# Patient Record
Sex: Male | Born: 1935 | Race: White | Hispanic: No | Marital: Married | Smoking: Former smoker
Health system: Southern US, Community
[De-identification: ages and names within clinical notes are randomized; demographics above are authoritative.]

## PROBLEM LIST (undated history)

## (undated) DIAGNOSIS — Z8719 Personal history of other diseases of the digestive system: Secondary | ICD-10-CM

## (undated) DIAGNOSIS — G4733 Obstructive sleep apnea (adult) (pediatric): Secondary | ICD-10-CM

## (undated) DIAGNOSIS — K635 Polyp of colon: Secondary | ICD-10-CM

## (undated) DIAGNOSIS — N400 Enlarged prostate without lower urinary tract symptoms: Secondary | ICD-10-CM

## (undated) DIAGNOSIS — D568 Other thalassemias: Secondary | ICD-10-CM

## (undated) DIAGNOSIS — C61 Malignant neoplasm of prostate: Secondary | ICD-10-CM

## (undated) DIAGNOSIS — R600 Localized edema: Secondary | ICD-10-CM

## (undated) DIAGNOSIS — I341 Nonrheumatic mitral (valve) prolapse: Secondary | ICD-10-CM

## (undated) DIAGNOSIS — Z950 Presence of cardiac pacemaker: Secondary | ICD-10-CM

## (undated) DIAGNOSIS — Z973 Presence of spectacles and contact lenses: Secondary | ICD-10-CM

## (undated) DIAGNOSIS — D649 Anemia, unspecified: Secondary | ICD-10-CM

## (undated) DIAGNOSIS — C4491 Basal cell carcinoma of skin, unspecified: Secondary | ICD-10-CM

## (undated) DIAGNOSIS — M109 Gout, unspecified: Secondary | ICD-10-CM

## (undated) DIAGNOSIS — K219 Gastro-esophageal reflux disease without esophagitis: Secondary | ICD-10-CM

## (undated) DIAGNOSIS — R350 Frequency of micturition: Secondary | ICD-10-CM

## (undated) DIAGNOSIS — J302 Other seasonal allergic rhinitis: Secondary | ICD-10-CM

## (undated) DIAGNOSIS — N200 Calculus of kidney: Secondary | ICD-10-CM

## (undated) DIAGNOSIS — J189 Pneumonia, unspecified organism: Secondary | ICD-10-CM

## (undated) DIAGNOSIS — I440 Atrioventricular block, first degree: Secondary | ICD-10-CM

## (undated) DIAGNOSIS — D563 Thalassemia minor: Secondary | ICD-10-CM

## (undated) DIAGNOSIS — R233 Spontaneous ecchymoses: Secondary | ICD-10-CM

## (undated) DIAGNOSIS — Z9289 Personal history of other medical treatment: Secondary | ICD-10-CM

## (undated) DIAGNOSIS — M1990 Unspecified osteoarthritis, unspecified site: Secondary | ICD-10-CM

## (undated) DIAGNOSIS — K5909 Other constipation: Secondary | ICD-10-CM

## (undated) DIAGNOSIS — G4730 Sleep apnea, unspecified: Secondary | ICD-10-CM

## (undated) DIAGNOSIS — K5730 Diverticulosis of large intestine without perforation or abscess without bleeding: Secondary | ICD-10-CM

## (undated) HISTORY — PX: CATARACT EXTRACTION W/ INTRAOCULAR LENS  IMPLANT, BILATERAL: SHX1307

## (undated) HISTORY — DX: Anemia, unspecified: D64.9

## (undated) HISTORY — DX: Morbid (severe) obesity due to excess calories: E66.01

## (undated) HISTORY — DX: Diverticulosis of large intestine without perforation or abscess without bleeding: K57.30

## (undated) HISTORY — DX: Other thalassemias: D56.8

## (undated) HISTORY — DX: Gastro-esophageal reflux disease without esophagitis: K21.9

## (undated) HISTORY — DX: Benign prostatic hyperplasia without lower urinary tract symptoms: N40.0

## (undated) HISTORY — DX: Basal cell carcinoma of skin, unspecified: C44.91

## (undated) HISTORY — DX: Atrioventricular block, first degree: I44.0

## (undated) HISTORY — DX: Polyp of colon: K63.5

## (undated) HISTORY — PX: CLOSED REDUCTION ANKLE DISLOCATION: SUR209

## (undated) HISTORY — DX: Unspecified osteoarthritis, unspecified site: M19.90

---
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
## 1998-03-11 ENCOUNTER — Ambulatory Visit (HOSPITAL_COMMUNITY): Admission: RE | Admit: 1998-03-11 | Discharge: 1998-03-11 | Payer: Self-pay | Admitting: Internal Medicine

## 1998-03-11 ENCOUNTER — Encounter: Payer: Self-pay | Admitting: Internal Medicine

## 1998-03-18 ENCOUNTER — Ambulatory Visit (HOSPITAL_COMMUNITY): Admission: RE | Admit: 1998-03-18 | Discharge: 1998-03-18 | Payer: Self-pay | Admitting: Internal Medicine

## 1998-03-18 ENCOUNTER — Encounter: Payer: Self-pay | Admitting: Internal Medicine

## 1998-04-18 ENCOUNTER — Encounter: Payer: Self-pay | Admitting: Internal Medicine

## 1998-04-18 ENCOUNTER — Ambulatory Visit (HOSPITAL_COMMUNITY): Admission: RE | Admit: 1998-04-18 | Discharge: 1998-04-18 | Payer: Self-pay | Admitting: Internal Medicine

## 1998-04-19 ENCOUNTER — Encounter: Payer: Self-pay | Admitting: Internal Medicine

## 1998-04-20 ENCOUNTER — Ambulatory Visit (HOSPITAL_COMMUNITY): Admission: RE | Admit: 1998-04-20 | Discharge: 1998-04-20 | Payer: Self-pay | Admitting: Internal Medicine

## 1998-04-20 ENCOUNTER — Encounter: Payer: Self-pay | Admitting: Internal Medicine

## 1998-05-13 ENCOUNTER — Ambulatory Visit: Admission: RE | Admit: 1998-05-13 | Discharge: 1998-05-13 | Payer: Self-pay | Admitting: Internal Medicine

## 1999-10-15 ENCOUNTER — Other Ambulatory Visit: Admission: RE | Admit: 1999-10-15 | Discharge: 1999-10-15 | Payer: Self-pay | Admitting: Orthopedic Surgery

## 2000-03-16 HISTORY — PX: TUMOR REMOVAL: SHX12

## 2002-01-30 ENCOUNTER — Encounter: Payer: Self-pay | Admitting: Pulmonary Disease

## 2002-01-30 ENCOUNTER — Ambulatory Visit (HOSPITAL_BASED_OUTPATIENT_CLINIC_OR_DEPARTMENT_OTHER): Admission: RE | Admit: 2002-01-30 | Discharge: 2002-01-30 | Payer: Self-pay | Admitting: Internal Medicine

## 2002-03-03 ENCOUNTER — Encounter: Payer: Self-pay | Admitting: Internal Medicine

## 2002-04-04 ENCOUNTER — Encounter: Payer: Self-pay | Admitting: Pulmonary Disease

## 2003-06-29 ENCOUNTER — Emergency Department (HOSPITAL_COMMUNITY): Admission: EM | Admit: 2003-06-29 | Discharge: 2003-06-29 | Payer: Self-pay | Admitting: Emergency Medicine

## 2004-03-06 ENCOUNTER — Ambulatory Visit: Payer: Self-pay | Admitting: Internal Medicine

## 2004-05-30 ENCOUNTER — Ambulatory Visit: Payer: Self-pay | Admitting: Internal Medicine

## 2004-07-01 ENCOUNTER — Ambulatory Visit: Payer: Self-pay | Admitting: Internal Medicine

## 2005-01-15 ENCOUNTER — Ambulatory Visit: Payer: Self-pay | Admitting: Internal Medicine

## 2005-02-09 ENCOUNTER — Ambulatory Visit: Payer: Self-pay | Admitting: Internal Medicine

## 2005-02-19 ENCOUNTER — Ambulatory Visit: Payer: Self-pay | Admitting: Internal Medicine

## 2005-02-19 ENCOUNTER — Encounter (INDEPENDENT_AMBULATORY_CARE_PROVIDER_SITE_OTHER): Payer: Self-pay | Admitting: Specialist

## 2005-03-15 ENCOUNTER — Emergency Department (HOSPITAL_COMMUNITY): Admission: EM | Admit: 2005-03-15 | Discharge: 2005-03-15 | Payer: Self-pay | Admitting: Family Medicine

## 2005-04-13 ENCOUNTER — Ambulatory Visit: Payer: Self-pay | Admitting: Internal Medicine

## 2005-04-30 ENCOUNTER — Ambulatory Visit: Payer: Self-pay | Admitting: Pulmonary Disease

## 2005-05-04 ENCOUNTER — Ambulatory Visit: Payer: Self-pay | Admitting: Internal Medicine

## 2005-05-11 ENCOUNTER — Ambulatory Visit: Payer: Self-pay

## 2005-06-05 ENCOUNTER — Ambulatory Visit: Payer: Self-pay | Admitting: Cardiology

## 2005-07-02 ENCOUNTER — Ambulatory Visit: Payer: Self-pay | Admitting: Pulmonary Disease

## 2005-08-04 ENCOUNTER — Ambulatory Visit: Payer: Self-pay | Admitting: Internal Medicine

## 2005-08-11 ENCOUNTER — Ambulatory Visit: Payer: Self-pay | Admitting: Internal Medicine

## 2005-09-21 ENCOUNTER — Ambulatory Visit: Payer: Self-pay | Admitting: Cardiology

## 2005-09-22 ENCOUNTER — Ambulatory Visit: Payer: Self-pay | Admitting: Internal Medicine

## 2005-10-03 ENCOUNTER — Emergency Department (HOSPITAL_COMMUNITY): Admission: EM | Admit: 2005-10-03 | Discharge: 2005-10-03 | Payer: Self-pay | Admitting: Family Medicine

## 2005-10-12 ENCOUNTER — Ambulatory Visit: Payer: Self-pay | Admitting: Cardiology

## 2005-12-25 ENCOUNTER — Ambulatory Visit: Payer: Self-pay | Admitting: Internal Medicine

## 2006-03-16 HISTORY — PX: MOLE REMOVAL: SHX2046

## 2006-09-15 ENCOUNTER — Ambulatory Visit: Payer: Self-pay | Admitting: Internal Medicine

## 2006-10-04 ENCOUNTER — Ambulatory Visit: Payer: Self-pay | Admitting: Cardiology

## 2006-10-07 ENCOUNTER — Ambulatory Visit: Payer: Self-pay

## 2006-12-08 ENCOUNTER — Encounter: Payer: Self-pay | Admitting: *Deleted

## 2006-12-08 DIAGNOSIS — Z9989 Dependence on other enabling machines and devices: Secondary | ICD-10-CM

## 2006-12-08 DIAGNOSIS — I059 Rheumatic mitral valve disease, unspecified: Secondary | ICD-10-CM | POA: Insufficient documentation

## 2006-12-08 DIAGNOSIS — Z9849 Cataract extraction status, unspecified eye: Secondary | ICD-10-CM

## 2006-12-08 DIAGNOSIS — E785 Hyperlipidemia, unspecified: Secondary | ICD-10-CM | POA: Insufficient documentation

## 2006-12-08 DIAGNOSIS — G4733 Obstructive sleep apnea (adult) (pediatric): Secondary | ICD-10-CM | POA: Insufficient documentation

## 2006-12-08 DIAGNOSIS — Z87898 Personal history of other specified conditions: Secondary | ICD-10-CM | POA: Insufficient documentation

## 2006-12-08 DIAGNOSIS — I440 Atrioventricular block, first degree: Secondary | ICD-10-CM | POA: Insufficient documentation

## 2006-12-08 DIAGNOSIS — Z87442 Personal history of urinary calculi: Secondary | ICD-10-CM

## 2006-12-08 DIAGNOSIS — K5730 Diverticulosis of large intestine without perforation or abscess without bleeding: Secondary | ICD-10-CM | POA: Insufficient documentation

## 2006-12-08 DIAGNOSIS — Z9889 Other specified postprocedural states: Secondary | ICD-10-CM

## 2006-12-08 DIAGNOSIS — K279 Peptic ulcer, site unspecified, unspecified as acute or chronic, without hemorrhage or perforation: Secondary | ICD-10-CM | POA: Insufficient documentation

## 2006-12-08 DIAGNOSIS — D568 Other thalassemias: Secondary | ICD-10-CM

## 2006-12-08 DIAGNOSIS — Z8669 Personal history of other diseases of the nervous system and sense organs: Secondary | ICD-10-CM | POA: Insufficient documentation

## 2007-01-12 ENCOUNTER — Ambulatory Visit: Payer: Self-pay | Admitting: Internal Medicine

## 2007-08-29 ENCOUNTER — Ambulatory Visit: Payer: Self-pay | Admitting: Internal Medicine

## 2007-08-29 DIAGNOSIS — I10 Essential (primary) hypertension: Secondary | ICD-10-CM

## 2007-08-29 DIAGNOSIS — H6120 Impacted cerumen, unspecified ear: Secondary | ICD-10-CM | POA: Insufficient documentation

## 2007-08-29 DIAGNOSIS — J310 Chronic rhinitis: Secondary | ICD-10-CM

## 2007-08-29 DIAGNOSIS — M109 Gout, unspecified: Secondary | ICD-10-CM | POA: Insufficient documentation

## 2007-08-29 DIAGNOSIS — M1990 Unspecified osteoarthritis, unspecified site: Secondary | ICD-10-CM | POA: Insufficient documentation

## 2007-08-29 DIAGNOSIS — N400 Enlarged prostate without lower urinary tract symptoms: Secondary | ICD-10-CM | POA: Insufficient documentation

## 2007-08-29 DIAGNOSIS — R42 Dizziness and giddiness: Secondary | ICD-10-CM

## 2007-08-29 LAB — CONVERTED CEMR LAB
BUN: 20 mg/dL (ref 6–23)
Basophils Absolute: 0 10*3/uL (ref 0.0–0.1)
Basophils Relative: 0.6 % (ref 0.0–1.0)
CO2: 29 meq/L (ref 19–32)
Calcium: 9.8 mg/dL (ref 8.4–10.5)
Chloride: 104 meq/L (ref 96–112)
Cholesterol: 214 mg/dL (ref 0–200)
Creatinine, Ser: 1.1 mg/dL (ref 0.4–1.5)
Direct LDL: 145 mg/dL
Eosinophils Absolute: 0.3 10*3/uL (ref 0.0–0.7)
Eosinophils Relative: 4.6 % (ref 0.0–5.0)
GFR calc Af Amer: 85 mL/min
GFR calc non Af Amer: 70 mL/min
Glucose, Bld: 93 mg/dL (ref 70–99)
HCT: 41.3 % (ref 39.0–52.0)
HDL: 42.1 mg/dL (ref >39.0)
Hemoglobin: 13.3 g/dL (ref 13.0–17.0)
Lymphocytes Relative: 41.3 % (ref 12.0–46.0)
MCHC: 32.4 g/dL (ref 30.0–36.0)
MCV: 61.5 fL — ABNORMAL LOW (ref 78.0–100.0)
Monocytes Absolute: 0.7 10*3/uL (ref 0.1–1.0)
Monocytes Relative: 9.7 % (ref 3.0–12.0)
Neutro Abs: 3 10*3/uL (ref 1.4–7.7)
Neutrophils Relative %: 43.8 % (ref 43.0–77.0)
Platelets: 240 10*3/uL (ref 150–400)
Potassium: 4.7 meq/L (ref 3.5–5.1)
RBC: 6.71 M/uL — ABNORMAL HIGH (ref 4.22–5.81)
RDW: 17.2 % — ABNORMAL HIGH (ref 11.5–14.6)
Sodium: 140 meq/L (ref 135–145)
TSH: 2.59 microintl units/mL (ref 0.35–5.50)
Total CHOL/HDL Ratio: 5.1
Triglycerides: 118 mg/dL (ref 0–149)
Uric Acid, Serum: 10.1 mg/dL — ABNORMAL HIGH (ref 4.0–7.8)
VLDL: 24 mg/dL (ref 0–40)
WBC: 6.8 10*3/uL (ref 4.5–10.5)

## 2007-08-30 ENCOUNTER — Encounter: Payer: Self-pay | Admitting: Internal Medicine

## 2007-09-07 ENCOUNTER — Telehealth: Payer: Self-pay | Admitting: Internal Medicine

## 2007-09-08 ENCOUNTER — Encounter: Payer: Self-pay | Admitting: Internal Medicine

## 2007-09-08 ENCOUNTER — Telehealth: Payer: Self-pay | Admitting: Internal Medicine

## 2007-10-10 ENCOUNTER — Ambulatory Visit: Payer: Self-pay | Admitting: Cardiology

## 2007-10-11 ENCOUNTER — Ambulatory Visit: Payer: Self-pay | Admitting: Cardiology

## 2007-10-11 LAB — CONVERTED CEMR LAB
BUN: 16 mg/dL (ref 6–23)
CO2: 25 meq/L (ref 19–32)
Calcium: 9.4 mg/dL (ref 8.4–10.5)
Chloride: 106 meq/L (ref 96–112)
Cholesterol: 155 mg/dL (ref 0–200)
Creatinine, Ser: 1 mg/dL (ref 0.4–1.5)
GFR calc Af Amer: 94 mL/min
GFR calc non Af Amer: 78 mL/min
Glucose, Bld: 85 mg/dL (ref 70–99)
HDL: 37.3 mg/dL — ABNORMAL LOW (ref >39.0)
LDL Cholesterol: 109 mg/dL — ABNORMAL HIGH (ref 0–99)
Magnesium: 2 mg/dL (ref 1.5–2.5)
Potassium: 3.9 meq/L (ref 3.5–5.1)
Sodium: 139 meq/L (ref 135–145)
Total CHOL/HDL Ratio: 4.2
Triglycerides: 45 mg/dL (ref 0–149)
VLDL: 9 mg/dL (ref 0–40)

## 2007-10-18 ENCOUNTER — Encounter: Payer: Self-pay | Admitting: Internal Medicine

## 2007-11-22 ENCOUNTER — Emergency Department (HOSPITAL_COMMUNITY): Admission: EM | Admit: 2007-11-22 | Discharge: 2007-11-22 | Payer: Self-pay | Admitting: Emergency Medicine

## 2007-12-08 ENCOUNTER — Ambulatory Visit: Payer: Self-pay | Admitting: Internal Medicine

## 2008-01-11 ENCOUNTER — Ambulatory Visit: Payer: Self-pay | Admitting: Internal Medicine

## 2008-01-11 ENCOUNTER — Telehealth: Payer: Self-pay | Admitting: Internal Medicine

## 2008-02-01 ENCOUNTER — Encounter: Payer: Self-pay | Admitting: Internal Medicine

## 2008-02-02 ENCOUNTER — Telehealth: Payer: Self-pay | Admitting: Internal Medicine

## 2008-02-03 ENCOUNTER — Ambulatory Visit: Payer: Self-pay | Admitting: Cardiology

## 2008-02-03 ENCOUNTER — Ambulatory Visit: Payer: Self-pay

## 2008-02-27 ENCOUNTER — Ambulatory Visit: Payer: Self-pay | Admitting: Cardiology

## 2008-02-29 ENCOUNTER — Ambulatory Visit: Payer: Self-pay | Admitting: Internal Medicine

## 2008-03-23 ENCOUNTER — Telehealth: Payer: Self-pay | Admitting: Internal Medicine

## 2008-04-05 ENCOUNTER — Encounter: Payer: Self-pay | Admitting: Internal Medicine

## 2008-04-05 ENCOUNTER — Ambulatory Visit: Payer: Self-pay | Admitting: Internal Medicine

## 2008-04-07 ENCOUNTER — Encounter: Payer: Self-pay | Admitting: Internal Medicine

## 2008-04-16 ENCOUNTER — Ambulatory Visit: Payer: Self-pay | Admitting: Pulmonary Disease

## 2008-05-24 ENCOUNTER — Ambulatory Visit: Payer: Self-pay | Admitting: Endocrinology

## 2008-05-24 DIAGNOSIS — R05 Cough: Secondary | ICD-10-CM

## 2008-05-24 DIAGNOSIS — J069 Acute upper respiratory infection, unspecified: Secondary | ICD-10-CM | POA: Insufficient documentation

## 2008-06-18 ENCOUNTER — Ambulatory Visit: Payer: Self-pay | Admitting: Internal Medicine

## 2008-06-18 ENCOUNTER — Telehealth: Payer: Self-pay | Admitting: Internal Medicine

## 2008-07-03 ENCOUNTER — Ambulatory Visit: Payer: Self-pay | Admitting: Internal Medicine

## 2008-07-04 DIAGNOSIS — R319 Hematuria, unspecified: Secondary | ICD-10-CM

## 2008-07-04 DIAGNOSIS — Z8719 Personal history of other diseases of the digestive system: Secondary | ICD-10-CM | POA: Insufficient documentation

## 2008-07-25 ENCOUNTER — Encounter: Payer: Self-pay | Admitting: Internal Medicine

## 2008-09-26 ENCOUNTER — Ambulatory Visit: Payer: Self-pay | Admitting: Internal Medicine

## 2008-09-26 DIAGNOSIS — H8309 Labyrinthitis, unspecified ear: Secondary | ICD-10-CM | POA: Insufficient documentation

## 2008-11-20 ENCOUNTER — Ambulatory Visit: Payer: Self-pay | Admitting: Cardiology

## 2008-11-20 DIAGNOSIS — R011 Cardiac murmur, unspecified: Secondary | ICD-10-CM

## 2008-11-20 DIAGNOSIS — R002 Palpitations: Secondary | ICD-10-CM | POA: Insufficient documentation

## 2008-11-28 ENCOUNTER — Encounter: Payer: Self-pay | Admitting: Cardiology

## 2008-11-28 ENCOUNTER — Ambulatory Visit: Payer: Self-pay

## 2008-12-03 ENCOUNTER — Telehealth: Payer: Self-pay | Admitting: Cardiology

## 2008-12-05 ENCOUNTER — Telehealth: Payer: Self-pay | Admitting: Cardiology

## 2008-12-06 ENCOUNTER — Ambulatory Visit: Payer: Self-pay | Admitting: Cardiology

## 2008-12-07 ENCOUNTER — Ambulatory Visit: Payer: Self-pay | Admitting: Cardiology

## 2008-12-13 ENCOUNTER — Ambulatory Visit: Payer: Self-pay | Admitting: Internal Medicine

## 2008-12-26 ENCOUNTER — Telehealth: Payer: Self-pay | Admitting: Cardiology

## 2009-01-31 ENCOUNTER — Telehealth: Payer: Self-pay | Admitting: Cardiology

## 2009-02-20 ENCOUNTER — Telehealth: Payer: Self-pay | Admitting: Cardiology

## 2009-03-16 HISTORY — PX: ESOPHAGOGASTRODUODENOSCOPY: SHX1529

## 2009-08-23 ENCOUNTER — Ambulatory Visit: Payer: Self-pay | Admitting: Internal Medicine

## 2009-09-10 ENCOUNTER — Ambulatory Visit: Payer: Self-pay | Admitting: Internal Medicine

## 2009-09-11 ENCOUNTER — Telehealth: Payer: Self-pay | Admitting: Internal Medicine

## 2009-09-13 ENCOUNTER — Telehealth: Payer: Self-pay | Admitting: Internal Medicine

## 2009-09-18 ENCOUNTER — Ambulatory Visit: Payer: Self-pay | Admitting: Internal Medicine

## 2009-09-18 DIAGNOSIS — Z860100 Personal history of colon polyps, unspecified: Secondary | ICD-10-CM | POA: Insufficient documentation

## 2009-09-18 DIAGNOSIS — K219 Gastro-esophageal reflux disease without esophagitis: Secondary | ICD-10-CM | POA: Insufficient documentation

## 2009-09-18 DIAGNOSIS — R1084 Generalized abdominal pain: Secondary | ICD-10-CM

## 2009-09-19 ENCOUNTER — Ambulatory Visit: Payer: Self-pay | Admitting: Internal Medicine

## 2009-09-20 ENCOUNTER — Ambulatory Visit (HOSPITAL_COMMUNITY): Admission: RE | Admit: 2009-09-20 | Discharge: 2009-09-20 | Payer: Self-pay | Admitting: Internal Medicine

## 2009-09-20 ENCOUNTER — Encounter: Payer: Self-pay | Admitting: Internal Medicine

## 2009-10-14 ENCOUNTER — Ambulatory Visit: Payer: Self-pay | Admitting: Internal Medicine

## 2009-10-14 DIAGNOSIS — R142 Eructation: Secondary | ICD-10-CM

## 2009-10-14 DIAGNOSIS — R143 Flatulence: Secondary | ICD-10-CM

## 2009-10-14 DIAGNOSIS — R141 Gas pain: Secondary | ICD-10-CM

## 2009-10-29 ENCOUNTER — Encounter: Payer: Self-pay | Admitting: Internal Medicine

## 2009-12-02 ENCOUNTER — Telehealth: Payer: Self-pay | Admitting: Internal Medicine

## 2009-12-18 ENCOUNTER — Telehealth: Payer: Self-pay | Admitting: Internal Medicine

## 2010-01-18 ENCOUNTER — Telehealth: Payer: Self-pay | Admitting: Internal Medicine

## 2010-04-15 NOTE — Procedures (Signed)
Summary: Upper Endoscopy  Patient: [name] Note: All result statuses are Final unless otherwise noted.  Tests: (1) Upper Endoscopy (EGD)   EGD Upper Endoscopy       DONE     [facility]     [address]     [place], [place]  [address]           ENDOSCOPY PROCEDURE REPORT           PATIENT:  Jacob [name], Jacob [name]  MR#:  [id number]     BIRTHDATE:  [birth year], 76 yrs. old  GENDER:  male           ENDOSCOPIST:  Wilhemina Bonito. Eda Keys, MD     Referred by:  Office           PROCEDURE DATE:  09/19/2009     PROCEDURE:  EGD with biopsy     ASA CLASS:  Class II     INDICATIONS:  bloating discomfort; abnormal lab ? celiac sprue (Hx     PUD / B-II)           MEDICATIONS:   Fentanyl 50 mcg IV, Versed 7 mg IV     TOPICAL ANESTHETIC:  Exactacain Spray           DESCRIPTION OF PROCEDURE:   After the risks benefits and     alternatives of the procedure were thoroughly explained, informed     consent was obtained.  The LB GIF-H180 [id number] endoscope was     introduced through the mouth and advanced to the proximal jejunum,     without limitations.  The instrument was slowly withdrawn as the     mucosa was fully examined.     <<PROCEDUREIMAGES>>           S/P B-II partial gastrectomy was found. Both limbs were entered     deeply. No mucosal abnormalities found. SB bx taken to r/o sprue.     Retroflexed views revealed no abnormalities.    The scope was then     withdrawn from the patient and the procedure completed.     COMPLICATIONS:  None           ENDOSCOPIC IMPRESSION:     1) S/p partial gastrectomy; otherwise normal           RECOMMENDATIONS:     1) Keep Abdominal Ultrasound Appt     2) Finish Align     3) F/U Biopsies           ______________________________     Wilhemina Bonito. Eda Keys, MD           CC:  Jacques Navy, MD, The Patient           n.     Rosalie DoctorWilhemina Bonito. Eda Keys at 09/19/2009 09:19 AM           Sawtell, Eddie Candle, [id number]  Note: An exclamation  mark (!) indicates a result that was not dispersed into the flowsheet. Document Creation Date: 09/19/2009 9:21 AM _______________________________________________________________________  (1) Order result status: Final Collection or observation date-time: 09/19/2009 09:06 Requested date-time:  Receipt date-time:  Reported date-time:  Referring Physician:   Ordering Physician: [name] Setters [id number]) Specimen Source:  Source: Launa Grill Order Number: [id number] Lab site:

## 2010-04-15 NOTE — Procedures (Signed)
Summary: Colonoscopy/[facility]  Colonoscopy/[facility]   Imported By: Sherian Rein 10/15/2009 10:25:10  _____________________________________________________________________  External Attachment:    Type:   Image     Comment:   External Document

## 2010-04-15 NOTE — Progress Notes (Signed)
Summary: Questions about meds.  Phone Note Call from Patient Call back at Home Phone [phone]   Caller: Patient Call For: Dr. Marina Goodell Reason for Call: Talk to Nurse Summary of Call: pt. has some questions about his meds Initial call taken by: Karna Christmas,  December 18, 2009 10:59 AM  Follow-up for Phone Call        Pt. completed  Metronidazole and started feeling better.   Someone recommended a yeast tablet daily   (Saccrhomyces Boulard II) and restart probiotic as symptoms have partially came back after restarting eating [employer].  Also,  it was mentioned to him at his last visit  that he may have to restart the Metronidazole after a period of time.  Please advise.  Thanks. Follow-up by: Milford Cage NCMA,  December 19, 2009 9:00 AM  Additional Follow-up for Phone Call Additional follow up Details #1::        okay to treat with another course of metronidazole 250 mg q.i.d. x10 days (#40), no refills. No strong feelings regarding the yeast tablet  called patient and advised Rx. will be sent to pharmacy and to call to let us know how he is doing. Milford Cage [facility]  December 19, 2009 9:46 AM  Additional Follow-up by: Hilarie Fredrickson MD,  December 19, 2009 9:25 AM    Prescriptions: METRONIDAZOLE 250 MG TABS (METRONIDAZOLE) 1 by mouth four times per day  #40 x 0   Entered by:   Milford Cage NCMA   Authorized by:   Hilarie Fredrickson MD   Signed by:   Milford Cage NCMA on 12/19/2009   Method used:   Electronically to        [id number] (retail)       [address]       [place], [place]  [address]       Ph: [phone]       Fax: [phone]   RxID:   [id number]

## 2010-04-15 NOTE — Progress Notes (Signed)
Summary: GI Appt/Dr Marina Goodell  Phone Note Outgoing Call   Call placed by: Dagoberto Reef,  September 11, 2009 3:04 PM Call placed to: Patient Summary of Call: Dr Debby Bud, called pt to inform him of appt with Dr Marina Goodell 10/16/09 @ 330pm, pt wonted earlier appt, called GI back Lupita Leash returned call said that Dr Marina Goodell did not have anything earlier but he could see Rozetta Nunnery (NP) on 09/17/09 @ 830am. I called pt back to inform him of appt he declined appt said he only wonted to see Dr Marina Goodell and to keep him on for 10/16/09,they will put pt on cancellation list, he said that he would call you or Dr Marina Goodell tomorrow and explain his situation. Initial call taken by: Dagoberto Reef,  September 11, 2009 3:20 PM  Follow-up for Phone Call        Willette Cluster does a good job and she will have the ear of Dr. Marina Goodell. If there is not an earlier appointment I would advise taking the appt offered with the PA Follow-up by: Jacques Navy MD,  September 11, 2009 4:57 PM

## 2010-04-15 NOTE — Assessment & Plan Note (Signed)
Summary: gas,pain under rib cage/cd   Vital Signs:  Patient profile:   77 year old male Height:      69 inches Weight:      285 pounds BMI:     42.24 Temp:     97.8 degrees F Pulse rate:   50 / minute BP sitting:   130 / 74  Vitals Entered By: Lamar Sprinkles, CMA (August 23, 2009 3:25 PM) CC: C/o increase in gas, bloating, soft stools. Discomfort is after eating and in center of chest at bottom of sternum.    Primary Care Provider:  Jacques Navy MD  CC:  C/o increase in gas, bloating, and soft stools. Discomfort is after eating and in center of chest at bottom of sternum. Jacob [name]  History of Present Illness: Patient presents c/o several weeks h/o early satiety, marked bloating after meals, increased eructation and discomfort. Almost anything he eats causes problems but he feels that dairy may make it worse. He is also concerned that breads (gluten) makes it works. He has had no hemetesis, melena or hematochezia. He did has gastrectomy many years ago but has not had any upper GI evaluation for many years.   Current Medications (verified): 1)  Zyrtec Allergy 10 Mg  Tabs (Cetirizine Hcl) .... Take 1 Tablet By Mouth Once A Day As Needed 2)  Colchicine 0.6 Mg  Tabs (Colchicine) .... As Needed 3)  Ibuprofen 200 Mg  Tabs (Ibuprofen) .... As Directed As Needed 4)  Nexium 40 Mg Cpdr (Esomeprazole Magnesium) .... As Needed 5)  Tylenol Extra Strength 500 Mg Tabs (Acetaminophen) .... As Needed 6)  Allopurinol 300 Mg Tabs (Allopurinol) .... Take 1 Tablet By Mouth Once A Day 7)  Vitamin D3 2000 Unit Caps (Cholecalciferol) .... Take 1 Tablet By Mouth Once A Day 8)  Coenzyme Q10 100 Mg Caps (Coenzyme Q10) .... Daily 9)  B Complex 100  Tabs (B Complex Vitamins) .... 1500mg  Daily 10)  Folic Acid 400 Mcg Tabs (Folic Acid) .... Daily 11)  Melatonin 5 Mg Tabs (Melatonin) .... At Bedtime As Needed  Allergies (verified): 1)  ! Prednisone 2)  ! Sulfa  Past History:  Past Medical History: Last updated:  December 10, 2008 BENIGN PROSTATIC HYPERTROPHY, HX OF (ICD-V13.8) DYSLIPIDEMIA (ICD-272.4) CATARACT, HX OF (ICD-V12.40) PEPTIC ULCER DISEASE (ICD-533.90) MITRAL VALVE PROLAPSE (ICD-424.0) SLEEP APNEA (ICD-780.57) MORBID OBESITY, HX OF (ICD-V13.8) NEPHROLITHIASIS, HX OF (ICD-V13.01) DIVERTICULOSIS, COLON (ICD-562.10) THALASSEMIA NEC (ICD-282.49) BLOCK, AV, 1ST DEGREE (ICD-426.11) Gout Osteoarthritis Basal cell carcinoma(s) Benign prostatic hypertrophy  Past Surgical History: Last updated: 08/29/2007 * TUMOR REMOVED FROM FINGER left index * MOLES REMOVED * ANKLE SURGERY-ORIF right '78 CATARACT EXTRACTION, HX OF (ICD-V45.61)-blateral GASTRECTOMY, HX OF (ICD-V15.2) '56  Family History: Last updated: December 10, 2008 Father - deceased @ 78:CAD/MI, CHF, Gout Mother - deceased @ 39: thallessemia, gangrenous hernia,  Neg - DM, Prostate Cancer PGM - colon cancer, MGA - colon cancer Strong family h/o CAD paternal kinship  Social History: Last updated: 12-10-2008 [employer] 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  Risk Factors: Caffeine Use: 5+ (08/29/2007) Exercise: yes (08/29/2007)  Risk Factors: Smoking Status: quit (12/08/2006) Passive Smoke Exposure: no (08/29/2007)  Review of Systems       The patient complains of anorexia, weight loss, and abdominal pain.  The patient denies fever, weight gain, decreased hearing, chest pain, syncope, dyspnea on exertion, headaches,  hemoptysis, melena, hematochezia, severe indigestion/heartburn, muscle weakness, difficulty walking, depression, abnormal bleeding, and enlarged lymph nodes.   GI:  Complains of abdominal pain, gas, indigestion, loss of appetite, and nausea; denies bloody stools, change in bowel habits, constipation, dark tarry stools, diarrhea, excessive appetite, vomiting blood, and  yellowish skin color.  Physical Exam  General:  obese white male in no acute distress Head:  normocephalic and atraumatic.   Eyes:  corneas and lenses clear and no injection.   Lungs:  normal respiratory effort.   Heart:  normal rate and regular rhythm.   Abdomen:  obese with protruberant abdomen. BS + x 4 quadrants. No guarding or rebound. No masses. Pulses:  2+ radial pulses Neurologic:  alert & oriented X3 and gait normal.   Skin:  turgor normal and color normal.   Psych:  Oriented X3 and normally interactive.     Impression & Recommendations:  Problem # 1:  ABDOMINAL BLOATING (ICD-787.3) Patient with upper GI symptoms strongly suggestive of dyspepsia with no evidence of ulcer or GI bleed. He does have a history or ulcer and a history of gastrectomy. Exam is unrevealing. Lactose intolerance is a second diagnosis and celiac disease is a remote possibility.  Plan - trail of PPI therapy-prilosec otc 2 capsules qAM           if no relief in 3-4 days he will go dairy free           if no relief he will have panel for celiac disease and GI consult.             Problem # 2:  GOUT, UNSPECIFIED (ICD-274.9) Patient questions the need to take allopurinol . He has not had a flare of gout for some time. When he has problems he can usually associate it with dietary indiscretion.  Plan - drug holiday from allopurinol.  His updated medication list for this problem includes:    Colchicine 0.6 Mg Tabs (Colchicine) .Jacob [name]... As needed    Allopurinol 300 Mg Tabs (Allopurinol) .Jacob [name]... Take 1 tablet by mouth once a day  Complete Medication List: 1)  Zyrtec Allergy 10 Mg Tabs (Cetirizine hcl) .... Take 1 tablet by mouth once a day as needed 2)  Colchicine 0.6 Mg Tabs (Colchicine) .... As needed 3)  Ibuprofen 200 Mg Tabs (Ibuprofen) .... As directed as needed 4)  Nexium 40 Mg Cpdr (Esomeprazole magnesium) .... As needed 5)  Tylenol Extra Strength 500 Mg Tabs (Acetaminophen) .... As needed 6)   Allopurinol 300 Mg Tabs (Allopurinol) .... Take 1 tablet by mouth once a day 7)  Vitamin D3 2000 Unit Caps (Cholecalciferol) .... Take 1 tablet by mouth once a day 8)  Coenzyme Q10 100 Mg Caps (Coenzyme q10) .... Daily 9)  B Complex 100 Tabs (B complex vitamins) .... 1500mg  daily 10)  Folic Acid 400 Mcg Tabs (Folic acid) .... Daily 11)  Melatonin 5 Mg Tabs (Melatonin) .... At bedtime as needed

## 2010-04-15 NOTE — Letter (Signed)
Summary: Patient [facility] Biopsy Results  [facility]  [address] [place], [place] [address]   Phone: [phone]  Fax: [phone]        September 20, 2009 MRN: [id number]    [facility] [address] [place], [place]  [address]    Dear Mr. [facility],  I am pleased to inform you that the biopsies taken during your recent endoscopic examination were normal anddid not show any evidence of celiac sprue upon pathologic examination.    Additional information/recommendations:  __ Continue with the treatment plan as outlined on the day of your      exam.      Please call us if you are having persistent problems or have questions about your condition that have not been fully answered at this time.  Sincerely,  Hilarie Fredrickson MD  This letter has been electronically signed by your physician.  Appended Document: Patient Notice-Endo Biopsy Results letter mailed

## 2010-04-15 NOTE — Progress Notes (Signed)
Summary: Referral for daughter please  Phone Note Call from Patient Call back at Home Phone [phone] Call back at [phone]  or [phone]  (cell)   Caller: Patient Call For: Dr. Marina Goodell Reason for Call: Talk to Nurse Summary of Call: would like a referral to a GI specialist in [facility] for his daughter Initial call taken by: Vallarie Mare,  December 02, 2009 8:13 AM  Follow-up for Phone Call        Dr Marina Goodell he is asking if you know a GI in the [facility] area that you would recommend.  She has had a bad experience with a cholecystectomy last week.  Her father believes she has pancreatitis and is in severe pain.  I have recommended they take her to an ER for treatment  and will check with Dr Marina Goodell who he would recommend in the area for follow up. Follow-up by: Darcey Nora RN, CGRN,  December 02, 2009 9:40 AM  Additional Follow-up for Phone Call Additional follow up Details #1::        try [facility] of [place]. Phone number for appointments is [phone]. Try Dr. Sharyn Lull or associates Additional Follow-up by: Hilarie Fredrickson MD,  December 03, 2009 12:33 PM    Additional Follow-up for Phone Call Additional follow up Details #2::    Patient  is given Dr Lamar Sprinkles recommendation. Follow-up by: Darcey Nora RN, CGRN,  December 03, 2009 1:33 PM

## 2010-04-15 NOTE — Assessment & Plan Note (Signed)
Summary: FU Jacob [name]   Vital Signs:  Patient profile:   77 year old male Height:      69 inches Weight:      287 pounds BMI:     42.54 O2 Sat:      95 % on Room air Temp:     98.2 degrees F oral Pulse rate:   77 / minute BP sitting:   126 / 60  (left arm) Cuff size:   large  Vitals Entered By: Bill Salinas CMA (September 10, 2009 2:12 PM)  O2 Flow:  Room air CC: pt here for follow up on bloating, gas and abd distention. Pt states symptoms are much better but still present/ ab   Primary Care Provider:  Jacques Navy MD  CC:  pt here for follow up on bloating and gas and abd distention. Pt states symptoms are much better but still present/ ab.  History of Present Illness: Patient returns for follow-up of bloating and gas and discomfort. He was put to full strenght PPI therapy. On his own he was to try dairy free diet - made no difference. He is better but still has a lot of symptoms.   Current Medications (verified): 1)  Zyrtec Allergy 10 Mg  Tabs (Cetirizine Hcl) .... Take 1 Tablet By Mouth Once A Day As Needed 2)  Colchicine 0.6 Mg  Tabs (Colchicine) .... As Needed 3)  Ibuprofen 200 Mg  Tabs (Ibuprofen) .... As Directed As Needed 4)  Prilosec 20 Mg Cpdr (Omeprazole) .Marland Kitchen.. 1 Tab Two Times A Day 5)  Tylenol Extra Strength 500 Mg Tabs (Acetaminophen) .... As Needed 6)  Allopurinol 300 Mg Tabs (Allopurinol) .... Take 1 Tablet By Mouth Once A Day 7)  Vitamin D3 2000 Unit Caps (Cholecalciferol) .... Take 1 Tablet By Mouth Once A Day 8)  Coenzyme Q10 100 Mg Caps (Coenzyme Q10) .... Daily 9)  B Complex 100  Tabs (B Complex Vitamins) .... 1500mg  Daily 10)  Folic Acid 400 Mcg Tabs (Folic Acid) .... Daily 11)  Melatonin 5 Mg Tabs (Melatonin) .... At Bedtime As Needed  Allergies (verified): 1)  ! Prednisone 2)  ! Sulfa PMH-FH-SH reviewed-no changes except otherwise noted  Review of Systems       The patient complains of abdominal pain.  The patient denies anorexia, weight loss, weight  gain, chest pain, melena, and hematochezia.    Physical Exam  General:  obese white male in no acute distress Abdomen:  soft, non-tender, and normal bowel sounds.     Impression & Recommendations:  Problem # 1:  ABDOMINAL BLOATING (ICD-787.3) continued symptoms but better. He does have a remote history of PUD and has a remote h/o reux-in-Y gastric surgery.   Plan - lab to r/o celiac disease          GI consult - he has seen Dr. Marina Goodell in the past.   Orders: T- * Misc. Laboratory test [phone]) Gastroenterology Referral (GI)  Complete Medication List: 1)  Zyrtec Allergy 10 Mg Tabs (Cetirizine hcl) .... Take 1 tablet by mouth once a day as needed 2)  Colchicine 0.6 Mg Tabs (Colchicine) .... As needed 3)  Ibuprofen 200 Mg Tabs (Ibuprofen) .... As directed as needed 4)  Prilosec 20 Mg Cpdr (Omeprazole) .Marland Kitchen.. 1 tab two times a day 5)  Tylenol Extra Strength 500 Mg Tabs (Acetaminophen) .... As needed 6)  Allopurinol 300 Mg Tabs (Allopurinol) .... Take 1 tablet by mouth once a day 7)  Vitamin D3 2000 Unit  Caps (Cholecalciferol) .... Take 1 tablet by mouth once a day 8)  Coenzyme Q10 100 Mg Caps (Coenzyme q10) .... Daily 9)  B Complex 100 Tabs (B complex vitamins) .... 1500mg  daily 10)  Folic Acid 400 Mcg Tabs (Folic acid) .... Daily 11)  Melatonin 5 Mg Tabs (Melatonin) .... At bedtime as needed

## 2010-04-15 NOTE — Progress Notes (Signed)
Summary: Sooner appt/abdominal bloating  Phone Note Call from Patient Call back at [facility] Phone [phone]   Caller: Patient Reason for Call: Talk to Nurse Summary of Call: Pt spoke to Dr. Debby Bud office and is now accepting appt that was offered with Gunnar Fusi. Pt has abdominal bloating and gas.  Pt had subtotal gastrectomy 60 years ago. Pt would like sooner appt than 10/16/09 with Dr. Marina Goodell Initial call taken by: Francee Piccolo CMA Duncan Dull),  September 13, 2009 9:06 AM  Follow-up for Phone Call        Given appt. with N.P. for 09/18/09 as Dr.Nuchem Grattan is supervising Dr. Follow-up by: Teryl Lucy RN,  September 13, 2009 9:17 AM

## 2010-04-15 NOTE — Letter (Signed)
Summary: [facility]  [facility]   Imported By: Sherian Rein 10/31/2009 14:11:28  _____________________________________________________________________  External Attachment:    Type:   Image     Comment:   External Document

## 2010-04-15 NOTE — Procedures (Signed)
Summary: Colonoscopy   Colonoscopy  Procedure date:  03/03/2002  Findings:      Location:  [facility].  Tubular Adenoma  Colonoscopy  Procedure date:  03/03/2002  Findings:      Location:  [facility].  Tubular Adenoma Patient Name: Jacob [name], Jacob [name] MRN:  Procedure Procedures: Colonoscopy CPT: 4122376371.    with polypectomy. CPT: A3573898.  Personnel: Endoscopist: Wilhemina Bonito. Marina Goodell, MD.  Referred By: Rosalyn Gess. Norins, MD.  Exam Location: Exam performed in Outpatient Clinic. Outpatient  Patient Consent: Procedure, Alternatives, Risks and Benefits discussed, consent obtained, from patient. Consent was obtained by the RN.  Indications  Average Risk Screening Routine.  History  Pre-Exam Physical: Performed Mar 03, 2002. Entire physical exam was normal.  Exam Exam: Extent of exam reached: Terminal Ileum, extent intended: Terminal Ileum.  The cecum was identified by appendiceal orifice and IC valve. Patient position: on left side. Colon retroflexion performed. Images taken. ASA Classification: II. Tolerance: excellent.  Monitoring: Pulse and BP monitoring, Oximetry used. Supplemental O2 given.  Colon Prep Used Visicol for colon prep. Prep results: good.  Sedation Meds: Patient assessed and found to be appropriate for moderate (conscious) sedation. Fentanyl 150 mcg. given IV. Versed 10 mg. given IV.  Findings POLYP: Descending Colon, diminutive, Procedure:  snare with cautery, removed, retrieved, Polyp sent to pathology. ICD9: Colon Polyps: 211. 3.  NORMAL EXAM: Terminal Ileum to Rectum.  - DIVERTICULOSIS: Descending Colon to Sigmoid Colon. ICD9: Diverticulosis, Colon: 562.10.   Assessment Abnormal examination, see findings above.  Diagnoses: 562.10: Diverticulosis, Colon.  211.3: Colon Polyps.   Events  Unplanned Interventions: No intervention was required.  Unplanned Events: There were no complications. Plans Patient  Education: Patient given standard instructions for: Polyps.  Disposition: After procedure patient sent to recovery. After recovery patient sent home.  Scheduling/Referral: Colonoscopy, to Wilhemina Bonito. Marina Goodell, MD, in 3 years if polyp adenomatous.,    This report was created from the original endoscopy report, which was reviewed and signed by the above listed endoscopist.   cc:  Illene Regulus, MD

## 2010-04-15 NOTE — Procedures (Signed)
Summary: Colonoscopy/[facility]  Colonoscopy/[facility]   Imported By: Sherian Rein 10/15/2009 10:27:02  _____________________________________________________________________  External Attachment:    Type:   Image     Comment:   External Document

## 2010-04-15 NOTE — Progress Notes (Signed)
    Immunization History:  Influenza Immunization History:    Influenza:  0.68ml left deltoid mfg sanofi lot# [id number] (01/14/2010)

## 2010-04-15 NOTE — Assessment & Plan Note (Signed)
Summary: abd bloating--ch.   History of Present Illness Visit Type: Initial Consult Primary GI MD: Yancey Flemings MD Primary Provider: Illene Regulus, MD Chief Complaint: Possible recurrent gastric ulcer History of Present Illness:   Patient is a 77 year old male with multiple medical problems, known to Dr. Marina Goodell for history of colon polyps. Patient here with his wife for evaluation of bloating, abdominal discomfort. In April patient ate [place] food, developed diarrhea and bloating. Diarrhea quickly resolved but patient continued to have bloating as well as problems with acid reflux and vague upper abdominal discomfort.  Difficulty describing his upper abdominal discomfort but just know something is wrong. Saw Dr. Debby Bud on 08/23/09, started on PPI, made several lifestyle changes ( not eating late at night, cutting back on caffeine, etc..) and he has noticed some improvement in reflux symptoms but upper abdominal discomfort and bloating persist. No rectal bleeding or black stools. He had genetic testing for celiac disease and his HLA-DQ2 was positive. Tells me bloating occurs regardless of type of foods consumed.   Patient has a remote history of  PUD / partial gastrectomy 1950's.    GI Review of Systems    Reports abdominal pain, acid reflux, belching, bloating, chest pain, and  weight gain.     Location of  Abdominal pain: upper abdomen.    Denies dysphagia with liquids, dysphagia with solids, heartburn, loss of appetite, nausea, vomiting, vomiting blood, and  weight loss.      Reports diverticulosis and  hemorrhoids.     Denies anal fissure, black tarry stools, change in bowel habit, constipation, diarrhea, fecal incontinence, heme positive stool, irritable bowel syndrome, jaundice, light color stool, liver problems, rectal bleeding, and  rectal pain.  Preventive Screening-Counseling & Management      Drug Use:  no.      Current Medications (verified): 1)  Zyrtec Allergy 10 Mg  Tabs  (Cetirizine Hcl) .... Take 1 Tablet By Mouth Once A Day As Needed 2)  Colchicine 0.6 Mg  Tabs (Colchicine) .... As Needed 3)  Ibuprofen 200 Mg  Tabs (Ibuprofen) .... As Directed As Needed 4)  Prilosec 20 Mg Cpdr (Omeprazole) .Marland Kitchen.. 1 Tab Two Times A Day 5)  Tylenol Extra Strength 500 Mg Tabs (Acetaminophen) .... As Needed 6)  Allopurinol 300 Mg Tabs (Allopurinol) .... Take 1 Tablet By Mouth Once A Day 7)  Vitamin D3 2000 Unit Caps (Cholecalciferol) .... Take 1 Tablet By Mouth Once A Day 8)  Coenzyme Q10 100 Mg Caps (Coenzyme Q10) .... Daily 9)  B Complex 100  Tabs (B Complex Vitamins) .... 1500mg  Daily 10)  Folic Acid 400 Mcg Tabs (Folic Acid) .... Daily 11)  Melatonin 5 Mg Tabs (Melatonin) .... At Bedtime As Needed  Allergies (verified): 1)  ! Prednisone 2)  ! 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 * TUMOR REMOVED FROM FINGER left index * MOLES REMOVED * ANKLE SURGERY-ORIF right '78 CATARACT EXTRACTION, HX OF (ICD-V45.61)-blateral GASTRECTOMY, HX OF (ICD-V15.2) '56  Family History: Reviewed history from 11/16/2008 and no changes required. 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 Alcohol Use - no Illicit Drug Use - no  Review of Systems       The patient complains of allergy/sinus, blood in urine, sleeping problems, and sore throat.  The patient denies anemia, anxiety-new, arthritis/joint pain, back pain, breast changes/lumps, change in vision, confusion, cough, coughing up blood, depression-new, fainting, fatigue, fever, headaches-new, hearing problems, heart murmur, heart rhythm changes, itching, menstrual pain, muscle pains/cramps, night sweats, nosebleeds, pregnancy symptoms, shortness of breath, skin rash, swelling of feet/legs, swollen lymph glands, thirst - excessive , urination - excessive , urination changes/pain, urine leakage, vision changes, and voice change.    Vital Signs:  Patient profile:   77 year old male Height:      69 inches Weight:      284.38 pounds BMI:     42.15 Pulse rate:   72 / minute Pulse rhythm:   regular BP sitting:   122 / 70  (left arm) Cuff size:   large  Vitals Entered By: June McMurray CMA Duncan Dull) (September 18, 2009 8:16 AM)  Physical Exam  General:  Well developed, well nourished, no acute distress. Head:  Normocephalic and atraumatic. Eyes:  Conjunctiva pink, no icterus.  Mouth:  No oral lesions. Tongue moist.  Neck:  no obvious masses  Lungs:  Clear throughout to auscultation. Heart:  RRR. Murmur present. Abdomen:  Abdomen soft, obese, nontender, No obvious masses or hepatomegaly.Normal bowel sounds.  Msk:  No palmar erythema, no edema.  Extremities:  No palmar erythema, no edema.  Neurologic:  Alert and  oriented x4;  grossly normal neurologically. Skin:  Intact without significant lesions or rashes. Cervical Nodes:  No significant cervical adenopathy. Psych:  Alert and cooperative. Normal mood and affect.   Impression & Recommendations:  Problem # 1:  ABDOMINAL BLOATING  (ICD-787.3) Assessment Deteriorated Epigastric discomfort, bloating. Began with acute illness after eating [place] food in April. Rule out post-infectious process. Rule out biliary disease. Rule out celiac disease. Although not diagnostic, genetic testing for celiac revealed positive HLA-DQ2.  HLA-DQ8  was negative. Bloating ongoing for over two months now, will arrange for abdominal ultraound,as well as EGD with small bowel biopsy to evaluate for celiac disease. Additionally, trial of probiotic  Align           Orders: EGD (EGD) Ultrasound Abdomen (UAS) Ultrasound Abdomen (UAS)  Problem # 2:  PERSONAL HX COLONIC POLYPS (ICD-V12.72) Assessment: Comment Only Last surveillance colonoscopy was Jan. 2010 and pertinent for small adenomatous polyp and severe diverticulosis.  Problem # 3:  GERD (ICD-530.81) Assessment: Comment Only Not positive patient has GERD though PPI has helped some of his upper GI symptoms. Continue PPI at current dose for now. Further evaluation at time of EGD.   Problem # 4:  PARTIAL GASTRECTOMY, HX OF (ICD-V15.2) Assessment: Comment Only Remote- 1950's.   Problem # 5:  GOUT, UNSPECIFIED (ICD-274.9) On Allopurinol and Colchicine  Problem # 6:  THALASSEMIA NEC (ICD-282.49) Assessment: Comment Only  Patient Instructions: 1)  We have give you samples of Align capsues, a probiotic. Take 1 capsule daily for 14 days. 2)  We scheduled the Endoscopy with Dr. Marina Goodell on 09-19-09 Thursday. 3)  Directions and brochure given. 4)  [facility] Patient Information Guide given to patient. 5)  We also scheduled the Ultrasound at [facility] on 09-20-09.  Directions given. 6)  Copy sent to : Illene Regulus, MD 7)  The medication list was reviewed and reconciled.  All changed / newly prescribed medications were explained.  A complete medication list was provided to the patient / caregiver.

## 2010-04-15 NOTE — Assessment & Plan Note (Signed)
Summary: Followup EGD/gas   History of Present Illness Visit Type: Follow-up Visit Primary GI MD: Yancey Flemings MD Primary Provider: Illene Regulus, MD Chief Complaint: follow-up,  Pt. still c/o some bloating, gas and alternating constipation and diarrhea History of Present Illness:   77 year old with multiple medical problems as previously outlined. He presents today for followup regarding problems with increased gas and bloating for which she was initially seen 4 weeks ago. He is accompanied by his wife. He underwent upper endoscopy with duodenal biopsies. Examination was normal postoperative exam with negative biopsies for sprue. Ultrasound was negative. He was treated with probiotic Align. He presents for followup. He reports modest improvement. No other issues. Still concerned with gas.   GI Review of Systems    Reports belching and  bloating.      Denies abdominal pain, acid reflux, chest pain, dysphagia with liquids, dysphagia with solids, heartburn, loss of appetite, nausea, vomiting, vomiting blood, weight loss, and  weight gain.      Reports change in bowel habits, constipation, and  diarrhea.     Denies anal fissure, black tarry stools, diverticulosis, fecal incontinence, heme positive stool, hemorrhoids, irritable bowel syndrome, jaundice, light color stool, liver problems, rectal bleeding, and  rectal pain.    Current Medications (verified): 1)  Zyrtec Allergy 10 Mg  Tabs (Cetirizine Hcl) .... Take 1 Tablet By Mouth Once A Day As Needed 2)  Colchicine 0.6 Mg  Tabs (Colchicine) .... As Needed 3)  Ibuprofen 200 Mg  Tabs (Ibuprofen) .... As Directed As Needed 4)  Prilosec 20 Mg Cpdr (Omeprazole) .... 2  Tabs By Mouth   A Day 5)  Tylenol Extra Strength 500 Mg Tabs (Acetaminophen) .... As Needed 6)  Allopurinol 300 Mg Tabs (Allopurinol) .... Take 1 Tablet By Mouth Once A Day 7)  Vitamin D3 2000 Unit Caps (Cholecalciferol) .... Take 1 Tablet By Mouth Once A Day 8)  Coenzyme Q10 100 Mg  Caps (Coenzyme Q10) .... Daily 9)  B Complex 100  Tabs (B Complex Vitamins) .... 1500mg  Daily 10)  Folic Acid 400 Mcg Tabs (Folic Acid) .... Daily 11)  Melatonin 5 Mg Tabs (Melatonin) .... At Bedtime As Needed 12)  Align  Caps (Probiotic Product) .... Take 1 Capsule Daily  Allergies (verified): 1)  ! Prednisone 2)  ! Sulfa  Past History:  Past Medical History: Reviewed history from 09/18/2009 and no changes required. 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 * TUMOR REMOVED FROM FINGER left index * MOLES REMOVED * ANKLE SURGERY-ORIF right '78 CATARACT EXTRACTION, HX OF (ICD-V45.61)-blateral GASTRECTOMY, HX OF (ICD-V15.2) '56  Family History: Reviewed history from 11/16/2008 and no changes required. Father - deceased @ 78:CAD/MI, CHF, Gout Mother - deceased @ 16: thallessemia, gangrenous hernia,  Neg - DM, Prostate Cancer PGM - colon cancer, MGA - colon cancer Strong family h/o CAD paternal kinship  Social History: Reviewed history from 09/18/2009 and no changes required. 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 Alcohol Use - no Illicit Drug Use -  no  Review of Systems  The patient denies allergy/sinus, anemia, anxiety-new, arthritis/joint pain, back pain, blood in urine, breast changes/lumps, change in vision,  confusion, cough, coughing up blood, depression-new, fainting, fatigue, fever, headaches-new, hearing problems, heart murmur, heart rhythm changes, itching, muscle pains/cramps, night sweats, nosebleeds, shortness of breath, skin rash, sleeping problems, sore throat, swelling of feet/legs, swollen lymph glands, thirst - excessive, urination - excessive, urination changes/pain, urine leakage, vision changes, and voice change.    Vital Signs:  Patient profile:   77 year old male Height:      69 inches Weight:      285 pounds BMI:     42.24 Pulse rate:   68 / minute Pulse rhythm:   regular BP sitting:   128 / 68  (left arm)  Vitals Entered By: Milford Cage NCMA (October 14, 2009 3:54 PM)  Physical Exam  General:  Well developed, obese,well nourished, no acute distress. Head:  Normocephalic and atraumatic. Eyes:  anicteric Mouth:  no thrush Lungs:  Clear throughout to auscultation. Heart:  Regular rate and rhythm; no murmurs, rubs,  or bruits. Abdomen:  Soft, , obese,nontender and nondistended. No masses, hepatosplenomegaly or hernias noted. Normal bowel sounds. Msk:  Symmetrical with no gross deformities. Normal posture. Pulses:  Normal pulses noted. Extremities:  no edema Neurologic:  alert oriented Skin:  no jaundice Psych:  Alert and cooperative. Normal mood and affect.   Impression & Recommendations:  Problem # 1:  FLATULENCE-GAS-BLOATING (ICD-787.3) problems with bloating and gas without alarm features.. May have an element of bacterial overgrowth  Plan: #1. Metronidazole 250 mg p.o. q.i.d. x10 days. One refill to be used in the future if this is helpful #2. Gas information brochure #3.anti-gas and flatulence dietary sheet #4. Follow p.r.n.  Patient Instructions: 1)  Metronidazole 250 mg tkae 4 times per day. 2)  Gas brochure given to patient. 3)  Gas prevention given. 4)  The medication list was reviewed and reconciled.  All changed / newly prescribed medications were  explained.  A complete medication list was provided to the patient / caregiver. Prescriptions: METRONIDAZOLE 250 MG TABS (METRONIDAZOLE) 1 by mouth four times per day  #40 x 1   Entered by:   Milford Cage NCMA   Authorized by:   Hilarie Fredrickson MD   Signed by:   Milford Cage NCMA on 10/14/2009   Method used:   Electronically to        [id number] (retail)       [address]       [place], [place]  [address]       Ph: [phone]       Fax: [phone]   RxID:   [id number]

## 2010-04-15 NOTE — Procedures (Signed)
Summary: colonoscopy   Colonoscopy  Procedure date:  02/19/2005  Findings:      Location:  [facility].  Results: Diverticulosis.       Pathology:  Adenomatous polyp.        Pathology:  Hyperplastic polyp.       Comments:      Repeat colonoscopy in 3 years.     Procedures Next Due Date:    Colonoscopy: 02/2008  Colonoscopy  Procedure date:  02/19/2005  Findings:      Location:  [facility].  Results: Diverticulosis.       Pathology:  Adenomatous polyp.        Pathology:  Hyperplastic polyp.       Comments:      Repeat colonoscopy in 3 years.     Procedures Next Due Date:    Colonoscopy: 02/2008 Patient Name: Jacob [name], Jacob [name] MRN:  Procedure Procedures: Colonoscopy CPT: 16109.    with polypectomy. CPT: [id number].  Personnel: Endoscopist: Wilhemina Bonito. Marina Goodell, MD.  Exam Location: Exam performed in Outpatient Clinic. Outpatient  Patient Consent: Procedure, Alternatives, Risks and Benefits discussed, consent obtained, from patient. Consent was obtained by the RN.  Indications  Surveillance of: Adenomatous Polyp(s). This is an initial surveillance exam. Initial polypectomy was performed in 2003. in Dec. 1-2 Polyps were found at Index Exam. Largest polyp removed was 1 to 5 mm. Prior polyp located in distal colon. Pathology of worst  polyp: tubular adenoma.  History  Current Medications: Patient is not currently taking Coumadin.  Pre-Exam Physical: Performed Feb 19, 2005. Cardio-pulmonary exam, Rectal exam, Abdominal exam, Mental status exam WNL.  Exam Exam: Extent of exam reached: Cecum, extent intended: Cecum.  The cecum was identified by appendiceal orifice and IC valve. Patient position: on left side. Colon retroflexion performed. Images taken. ASA Classification: II. Tolerance: excellent.  Monitoring: Pulse and BP monitoring, Oximetry used. Supplemental O2 given.  Colon Prep Used OSMOPREP for colon prep. Prep results: excellent.    Sedation Meds: Patient assessed and found to be appropriate for moderate (conscious) sedation. Fentanyl 75 mcg. given IV. Versed 8 mg. given IV.  Findings NORMAL EXAM: Cecum to Rectum.  - DIVERTICULOSIS: Ascending Colon to Sigmoid Colon. ICD9: Diverticulosis, Colon: 562.10. Comments: Marked changes w/ deep folds.  MULTIPLE POLYPS: Rectum. minimum size 2 mm, maximum size 5 mm. Procedure:  snare without cautery, removed, Polyp retrieved, 3 polyps Polyps sent to pathology. ICD9: Colon Polyps: 211.3.   Assessment Abnormal examination, see findings above.  Diagnoses: 562.10: Diverticulosis, Colon.  211.3: Colon Polyps.   Events  Unplanned Interventions: No intervention was required.  Unplanned Events: There were no complications. Plans Disposition: After procedure patient sent to recovery. After recovery patient sent home.  Scheduling/Referral: Colonoscopy, to Wilhemina Bonito. Marina Goodell, MD, in 3 years,    This report was created from the original endoscopy report, which was reviewed and signed by the above listed endoscopist.   cc:  Illene Regulus, MD      The Patient

## 2010-04-15 NOTE — Procedures (Signed)
Summary: Flexible Sigmoidoscopy/[facility]  Flexible Sigmoidoscopy/[facility]   Imported By: Sherian Rein 10/15/2009 10:22:28  _____________________________________________________________________  External Attachment:    Type:   Image     Comment:   External Document

## 2010-04-15 NOTE — Progress Notes (Signed)
Summary: Triage  Phone Note Call from Patient   Caller: [facility] @ Dr. Debby Bud [phone] Call For: Dr. Marina Goodell Reason for Call: Talk to Nurse Summary of Call: pt has an appt. on 10-16-09 and requesting sooner appt. Pt is having bloating and gas and abd distention Initial call taken by: Karna Christmas,  September 11, 2009 11:38 AM  Follow-up for Phone Call        Appt sch for pt to see Rozetta Nunnery, NP on July 5th.  mary notified. Follow-up by: Ashok Cordia RN,  September 11, 2009 2:59 PM

## 2010-04-15 NOTE — Letter (Signed)
Summary: EGD Instructions  [facility]  [address] [place], [place] [address]   Phone: [phone]  Fax: [phone]       Jacob [name]    December 16, 1933    MRN: [id number]       Procedure Day /Date:09-19-09     Arrival Time: 7:30 AM      Procedure Time: 8:30 AM     Location of Procedure:                    X    [facility] (4th Floor)  PREPARATION FOR ENDOSCOPY   On7-7-11 Thursday THE DAY OF THE PROCEDURE:  1.   No solid foods, milk or milk products are allowed after midnight the night before your procedure.  2.   Do not drink anything colored red or purple.  Avoid juices with pulp.  No orange juice.  3.  You may drink clear liquids until 6:30 AM  which is 2 hours before your procedure.                                                                                                CLEAR LIQUIDS INCLUDE: Water Jello Ice Popsicles Tea (sugar ok, no milk/cream) Powdered fruit flavored drinks Coffee (sugar ok, no milk/cream) Gatorade Juice: apple, white grape, white cranberry  Lemonade Clear bullion, consomm, broth Carbonated beverages (any kind) Strained chicken noodle soup Hard Candy   MEDICATION INSTRUCTIONS  Unless otherwise instructed, you should take regular prescription medications with a small sip of water as early as possible the morning of your procedure.        OTHER INSTRUCTIONS  You will need a responsible adult at least 75 years of age to accompany you and drive you home.   This person must remain in the waiting room during your procedure.  Wear loose fitting clothing that is easily removed.  Leave jewelry and other valuables at home.  However, you may wish to bring a book to read or an iPod/MP3 player to listen to music as you wait for your procedure to start.  Remove all body piercing jewelry and leave at home.  Total time from sign-in until discharge is approximately 2-3 hours.  You should go home directly after  your procedure and rest.  You can resume normal activities the day after your procedure.  The day of your procedure you should not:   Drive   Make legal decisions   Operate machinery   Drink alcohol   Return to work  You will receive specific instructions about eating, activities and medications before you leave.    The above instructions have been reviewed and explained to me by   _______________________    I fully understand and can verbalize these instructions _____________________________ Date _________

## 2010-06-16 ENCOUNTER — Other Ambulatory Visit: Payer: Self-pay | Admitting: Internal Medicine

## 2010-06-24 ENCOUNTER — Telehealth: Payer: Self-pay | Admitting: *Deleted

## 2010-06-24 NOTE — Telephone Encounter (Signed)
Pt aware.

## 2010-06-24 NOTE — Telephone Encounter (Signed)
Patient requesting work in apt this week. He is worried about a mole on his arm that has had some changes - it is larger and "looks funny"

## 2010-06-24 NOTE — Telephone Encounter (Signed)
Can see him tomorrow at 1 PM and will plan to do a mole excision.

## 2010-06-25 ENCOUNTER — Other Ambulatory Visit: Payer: Self-pay | Admitting: Internal Medicine

## 2010-06-25 ENCOUNTER — Ambulatory Visit (INDEPENDENT_AMBULATORY_CARE_PROVIDER_SITE_OTHER): Payer: 59 | Admitting: Internal Medicine

## 2010-06-25 VITALS — BP 120/70 | HR 63 | Temp 97.8°F | Wt 274.0 lb

## 2010-06-25 DIAGNOSIS — D489 Neoplasm of uncertain behavior, unspecified: Secondary | ICD-10-CM

## 2010-07-01 NOTE — Progress Notes (Signed)
Subjective:    Patient ID: Jacob [name], male    DOB: [birth year], 77 y.o.   MRN: [id number]  HPI Patinet is seen acutely for two new, worrisome skin lesions: one on the forearm, a dark changing lesion just above the left claviccle  PMH, FamHx and SocHx reviewed for any changes and relevance.     Review of Systems    Review of Systems  Constitutional:  Negative for fever, chills, activity change and unexpected weight change.  HENT:  Negative for hearing loss, ear pain, congestion, neck stiffness and postnasal drip.   Eyes: Negative for pain, discharge and visual disturbance.  Respiratory: Negative for chest tightness and wheezing.   Cardiovascular: Negative for chest pain and palpitations.       [No decreased exercise tolerance Gastrointestinal: [No change in bowel habit. No bloating or gas. No reflux or indigestion Genitourinary: Negative for urgency, frequency, flank pain and difficulty urinating.  Musculoskeletal: Negative for myalgias, back pain, arthralgias and gait problem.  Neurological: Negative for dizziness, tremors, weakness and headaches.  Hematological: Negative for adenopathy.  Psychiatric/Behavioral: Negative for behavioral problems and dysphoric mood.    Objective:   Physical Exam Overweight white male in no distress Skin- a scaly small lesion on the forearm that is persistent and recurrent. A smal dark mole that is new and gowing on the left upper jsut above the clavicle.    Procedure Note :     Procedure :  Skin biopsy   Indication:  Changing mole (s ),  Suspicious lesion(s)   Risks including unsuccessful procedure , bleeding, infection, bruising, scar, a need for another complete procedure and others were explained to the patient in detail as well as the benefits. Informed consent was obtained and signed.   The patient was placed in a decubitus position.  Lesion #1 on  Fore arm     measuring     4 mm   Skin over lesion #1  was prepped with  Betadine and alcohol  and anesthetized with 1 cc of 2% lidocaine and epinephrine, using a 25-gauge 1 inch needle.  Shave biopsy with a sterile Dermablade was carried out in the usual fashion. It was attempted to biopsy with  1 mm free margins.  Hyfrecator was used to destroy the rest of the lesion potentially left behind and for hemostasis. Band-Aid was applied with antibiotic ointment. Specimen to lab.    Lesion #2 on    Left upper chest above the clavicle  measuring  3  mm   Skin over lesion #2  was prepped with Betadine and alcohol  and anesthetized with 1 cc of 2% lidocaine and epinephrine, using a 25-gauge 1 inch needle.  Shave biopsy with a sterile Dermablade was carried out in the usual fashion. It was attempted to biopsy with  1 mm free margins.  Hyfrecator was used to destroy the rest of the lesion potentially left behind and for hemostasis. Band-Aid was applied with antibiotic ointment.   Tolerated well. Complications none.      Postprocedure instructions :    A Band-Aid should be  changed twice daily. You can take a shower tomorrow.  Keep the wounds clean. You can wash them with liquid soap and water. Pat dry with gauze or a Kleenex tissue  Before applying antibiotic ointment and a Band-Aid.   You need to report immediately  if fever, chills or any signs of infection develop.    The biopsy results should be available in 1 -2 weeks.  Assessment & Plan:  1. Skin lesions of suspicous appearance. - successfully excise with shave biopsy without complications. Specimens sent to path lab. Routine wound precautions.

## 2010-07-29 NOTE — Assessment & Plan Note (Signed)
[facility]                            CARDIOLOGY OFFICE NOTE   JESSEY, STEHLIN                  MRN:          [id number]  DATE:10/10/2007                            DOB:          1933/12/27    REASON FOR PRESENTATION:  Evaluate the patient with first-degree heart  block and left axis deviation.   HISTORY OF PRESENT ILLNESS:  The patient is 77 years old.  He has a  history of significant first-degree heart block, but no symptomatic  bradyarrhythmias.  He returns for yearly followup.  His current  complaints included increased thirst.  He has not changed his medicines.  He checks his blood sugar and has had a hemoglobin A1c and there is no  evidence of diabetes.  He did change his diet recently to a low-  carbohydrate diet, and it is not clear whether this could be  contributing.  In fact, one day he noted when he was on an elliptical  that his heart rate increased very quickly with low-level exercise.  He  thought he might be dehydrated that day.  He had been having increased  thirst and since that time, he has been taking much more water.  He has  had none of those palpitations with exercise since then but again is  drinking several bottles of water every day.  He has not had any chest  pain, neck, or arm discomfort.  He has not noticed any presyncope or  syncope.  He has had no shortness of breath.  He denies any PND or  orthopnea.   PAST MEDICAL HISTORY:  1. First-degree heart block.  2. Thalassemia minor.  3. Diverticulosis.  4. Nephrolithiasis.  5. Morbid obesity.  6. Sleep apnea.  7. Mitral valve prolapse, confirmed with echocardiography.  8. Peptic ulcer disease.  9. Cataracts.  10.Dyslipidemia.  11.Subtotal gastrectomy and duodenectomy for chronic ulcer disease.  12.Ankle surgery.  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  14.Moles removed.  15.Cataract surgery.   ALLERGIES:  Intolerance to SULFA and PREDNISONE.   MEDICATIONS:   Vitamin C, vitamin E, B12, folic acid, multivitamin,  coenzyme Q, Zyrtec 10 mg daily, and allopurinol 100 mg daily.   REVIEW OF SYSTEMS:  As stated in the HPI and otherwise negative for  other systems.   PHYSICAL EXAMINATION:  GENERAL:  The patient is in no distress.  VITAL SIGNS:  Blood pressure 134/78, heart rate 71 and regular, weight  265 pounds, and body mass index 40.  HEENT:  Eyes unremarkable.  Pupils equal, round, and reactive to light.  Fundi not visualized.  Oral mucosa unremarkable.  NECK:  No jugular venous distention at 45 degrees.  Carotid upstroke  brisk and symmetrical.  No bruits.  No thyromegaly.  LYMPHATICS:  No  cervical, axillary, or inguinal adenopathy.  LUNGS:  Clear to auscultation bilaterally.  BACK:  No costovertebral tenderness.  CHEST:  Unremarkable.  HEART:  PMI not displaced or sustained.  S1 and S2 within normal limits.  No S3.  No S4.  No clicks.  No rubs.  No murmurs.  ABDOMEN:  Obese.  Positive bowel sounds.  Normal in frequency and pitch.  No bruits.  No  rebound.  No guarding.  No midline pulsatile mass.  No hepatomegaly.  No  splenomegaly.  SKIN:  No rashes.  No nodules.  EXTREMITIES:  Pulses 2+ throughout.  No edema.  No cyanosis.  No  clubbing.  NEURO:  Alert and oriented to person, place, and time.  Cranial nerves  II through XII grossly intact.  Motor grossly intact.   EKG:  Sinus bradycardia, rate 55, first-degree AV block with PR interval  of 424, left axis deviation, and no acute ST-T wave changes.   ASSESSMENT AND PLAN:  1. First-degree atrioventricular block.  The patient has profound      first-degree AV block.  He has left axis deviation.  However, he      has had no change in his EKG.  He has chronotropic competence.  He      has no symptoms consistent with bradyarrhythmia.  He understands      that at some point he may well need a pacemaker.  He understands      that he has any syncopal episodes or presyncope, he needs to come       to the emergency room.  Until then he will continue with      observation.  2. Thirst.  I do not have a clear etiology to his increased thirst.  A      slight possibility that it could be related to Zyrtec.  It could be      related to his dietary changes.  At this point, no further      cardiovascular testing is suggested.  3. Obesity.  He is trying to lose weight with his diet and exercise.      We reviewed his regimen.  4. Followup.  I will see the patient again in 12 months or sooner if      needed.     Rollene Rotunda, MD, [facility]  Electronically Signed    JH/MedQ  DD: 10/10/2007  DT: 10/11/2007  Job #: [id number]   cc:   Rosalyn Gess. Norins, MD

## 2010-07-29 NOTE — Assessment & Plan Note (Signed)
[facility]                            CARDIOLOGY OFFICE NOTE   JERSEY, RAVENSCROFT                  MRN:          [id number]  DATE:10/04/2006                            DOB:          06-30-1933    PRIMARY CARE PHYSICIAN:  Rosalyn Gess. Norins, M.D.   REASON FOR PRESENTATION:  Evaluate patient with 1st degree heart block.   HISTORY OF PRESENT ILLNESS:  The patient presents for followup of his  1st degree heart block.  In the last year, he had been doing relatively  well up until December.  He was exercising quit a bit and had lost  perhaps 30-40 pounds.  However, he injured his foot.  He subsequently  had multiple problems including back strain and gout.  He subsequently  had not been exercising as much and gained back much of his weight.  He  also had some episodes of dizziness, this was in April.  He thought it  was related to allergies and asthma.  He started taking Zyrtec and  things got better.  He did not have presyncope or syncope at that time  but described things such as the room spinning.  This is no longer  happening.  He has been fatigued.  He only sleeps 5 hours a night.  He  has severe sleep apnea but only wears a CPAP 3-4 days out of a week.  He  has not had any new cardiovascular symptoms such as discharge, neck or  arm discomfort.  He has not noticed any palpitations.  He has had no new  shortness of breath and denies any PND or orthopnea.   PAST MEDICAL HISTORY:  1. First degree heart block.  2. Thalassemia minor.  3. Diverticulosis.  4. Nephrolithiasis.  5. Morbid obesity.  6. Sleep apnea.  7. Mitral valve prolapse confirmed by echocardiography.  8. Peptic ulcer disease.  9. Cataracts.  10.Dyslipidemia.  11.Subtotal gastrectomy and duodenectomy for chronic ulcer disease.  12.Ankle surgery.  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  14.Moles removed.  15.Cataract surgery.   ALLERGIES:  1. SULFA.  2. PREDNISONE.   MEDICATIONS:  Vitamin C, vitamin E, Saw palmetto, B12, folic acid,  multivitamin, Coenzyme-Q, zinc, fish oil, calcium (the patient stopped  his aspirin).   REVIEW OF SYSTEMS:  As stated above, positive for nosebleeds for which  he stopped his aspirin.   PHYSICAL EXAMINATION:  GENERAL:  The patient is in no acute distress.  VITAL SIGNS:  Blood pressure 138/77, heart rate 55 and regular, weight  262 pounds, body mass index 40.  HEENT:  Eyelids unremarkable.  Pupils are equal, round, and reactive to  light.  Fundi not visualized.  Oral mucosa unremarkable.  NECK:  No jugular venous distention at 45 degrees.  Carotid upstroke  brisk and symmetric.  No bruits.  No thyromegaly.  LYMPHATICS:  No cervical, axillary, or inguinal adenopathy.  LUNGS:  Clear to auscultation bilaterally.  BACK:  No costovertebral angle tenderness.  CHEST:  Unremarkable.  HEART:  PMI not displaced or sustained.  S1 and S2 within normal limits.  No S3, no S4, no clicks, no  rubs, no murmurs.  ABDOMEN:  Morbidly obese, positive bowel sounds, normal in frequency and  pitch.  No bruits.  No rebound.  No guarding.  No midline pulsatile  mass.  No hepatomegaly, no splenomegaly.  SKIN:  No rashes.  No nodules.  EXTREMITIES:  Two plus pulses throughout.  No edema, no cyanosis, no  clubbing.  NEUROLOGIC:  Oriented to person, place and time.  Cranial nerves II-XII  grossly intact.  Motor grossly intact.   EKG:  Sinus bradycardia, rate 55, profound 1st degree AV block, left  axis deviation, left anterior fascicular block, borderline  interventricular conduction block with slight QRS widening compared to  previous.   ASSESSMENT/PLAN:  1. Bradycardic arrhythmia.  The patient has had slight progression in      his conduction disturbance on his EKG.  I am going to apply a 48-      hour Holter monitor to make sure there are no profound brady      arrhythmias.  2. Fatigue.  I cannot separate this from his severe sleep apnea.   He      is encouraged to wear his CPAP every day and see if he feels better      with this.  I doubt that the fatigue is related to his bradycardic      arrhythmia but I will be able to judge chronotropic competence with      this monitor.  3. Hypertension.  His blood pressure is slightly elevated.  However,      when he gets checked at Dr. Debby Bud' office or at home it is under      140/90.  He is encouraged to start exercising again and loose      weight and he will probably reach controlled blood pressures all      the time.  In addition, CPAP should help.   FOLLOWUP:  I will see the patient again in 1 year unless he has any  significant abnormalities or symptoms.     Rollene Rotunda, MD, [facility]  Electronically Signed    JH/MedQ  DD: 10/04/2006  DT: 10/04/2006  Job #: [id number]   cc:   Rosalyn Gess. Norins, MD

## 2010-07-29 NOTE — Assessment & Plan Note (Signed)
[facility]                            CARDIOLOGY OFFICE NOTE   RATHANA, Jacob [name]                  MRN:          [id number]  DATE:02/03/2008                            DOB:          1933-04-08    REASON FOR PRESENTATION:  Palpitations.   HISTORY OF PRESENT ILLNESS:  The patient presents for follow up of the  above.  He called to be added onto the schedule, because he was  concerned about feeling some very strong heartbeats.  He is not  describing them as fast.  He cannot tell whether they are irregular.  He  is not having any presyncope or syncope.  He just has an uncomfortable  feeling in his chest.  It happens when he is under stress or has a lack  of sleep.  He has noticed it, when he has been exercising.  He has not  been having any chest pressure, neck, or arm discomfort.  He has had no  palpitations, presyncope, or syncope.  He has had no PND or orthopnea.  He did have an episode of this yesterday at rest while eating.   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   ALLERGIES:  Intolerance to SULFA and PREDNISONE.   MEDICATIONS:  Allopurinol.   REVIEW OF SYSTEMS:  As stated in the HPI and otherwise negative for all  other systems.   PHYSICAL EXAMINATION:  GENERAL:  The patient is pleasant and in no  distress.  VITAL SIGNS:  Blood pressure 122/72 and heart rate 62 and regular.  HEENT:  Eyelids unremarkable, pupils equal, round, and reactive to  light, fundi not visualized, oral mucosa unremarkable.  NECK:  No jugular venous distention at 45 degrees, carotid upstroke  brisk and symmetrical.  No  bruits and no thyromegaly.  LYMPHATICS:  No cervical, axillary, or inguinal adenopathy.  LUNGS:  Clear to auscultation bilaterally.  BACK:  No costovertebral angle tenderness.  CHEST:  Unremarkable.  HEART:  PMI not displaced or sustained, S1 and S2 within normal limits,  no S3, no S4, no clicks, rubs, and no murmurs.  ABDOMEN:  Obese,  positive bowel sounds.  Normal in frequency and pitch, no bruits,  rebound, guarding or midline pulsatile mass, no thyromegaly, no  splenomegaly.  SKIN:  No rashes, no nodules.  EXTREMITIES:  Pulses 2+ throughout, no edema, cyanosis, or clubbing.  NEURO:  Oriented to person, place, and time, cranial nerves II through  XII grossly intact, motor grossly intact.   ASSESSMENT AND PLAN:  1. Palpitations.  The patient is having palpitations.  I am concerned      that he could be having some brady arrhythmia like heart block that      he is feeling as he is not describing tachy palpitations but rather      a strong unusual heartbeat.  I am going to have  him on a 21-day      event monitor.  A 48-hour Holter monitor has not been helpful in      the past.  Further evaluation based on these results.  2. Obesity.  I applauded his 5 pound weight loss.  However, he was      eating sausage at [facility] yesterday when he had palpitations, when      we talked about these food choices.  3. Sleep apnea.  He works with Dr. Debby Bud on this.  4. Followup.  I will see the patient back after the event monitor.     Rollene Rotunda, MD, [facility]  Electronically Signed    JH/MedQ  DD: 02/03/2008  DT: 02/04/2008  Job #: [id number]   cc:   Rosalyn Gess. Norins, MD

## 2010-07-29 NOTE — Assessment & Plan Note (Signed)
Jacob [name]                            CARDIOLOGY OFFICE NOTE   Jacob [name], Jacob [name]                  MRN:          [id number]  DATE:02/27/2008                            DOB:          1934-01-06    PRIMARY CARE PHYSICIAN:  Rosalyn Gess. Norins, MD   REASON FOR PRESENTATION:  Evaluate the patient with palpitations.   HISTORY OF PRESENT ILLNESS:  The patient returns for followup after  complaining of palpitations when I last saw him.  I had him wear 21-day  event monitor.  However, he comes back saying that it was extremely  difficult to use.  He had trouble getting leads back on when he would  take them off.  He said it beeped all the time.  He did transmit quite a  few symptoms though his symptoms were not as severe as when I first saw  him.  However, when I last saw him, he said he was having some mild  palpitations and he would transmit.  However, all of the transmissions I  have demonstrated only sinus rhythm.  There was first-degree AV block.  There were no sustained dysrhythmias.  He had no presyncope or syncope.  He had no chest pain.  He had no shortness of breath.  Denies any PND or  orthopnea.  Unfortunately, he does not exercise and has actually gained  weight.  He has been eating because of stress.  In retrospect, he says  he was quite stressed at the time of his palpitations.  This seems to  have resolved as he has resolved some financial issues.  He had gout and  has been unable to walk which is also contributed to his weight gain.   PAST MEDICAL HISTORY:  First-degree heart block, thalassemia minor,  diverticulosis, nephrolithiasis, morbid obesity, sleep apnea, mitral  valve prolapse, peptic ulcer disease, cataracts, dyslipidemia, subtotal  gastrectomy and duodenectomy for chronic ulcer disease, ankle surgery,  tumor removed from his finger, moles removed, cataract surgery.   ALLERGIES AND INTOLERANCES:  SULFA and PREDNISONE.   MEDICATIONS:  1. Allopurinol 500 mg daily.  2. Vitamin C.  3. Vitamin B12.  4. Folic acid.  5. Coenzyme Q.   REVIEW OF SYSTEMS:  As stated in the HPI and negative for other systems.   PHYSICAL EXAMINATION:  GENERAL:  The patient is in no distress.  VITAL SIGNS:  Blood pressure 121/65, heart rate 61 and regular, weight  268 pounds, body mass index 40.  HEENT:  Eyes unremarkable.  Pupils equal, round, and reactive to light.  Fundi not visualized.  Oral mucosa unremarkable.  NECK:  No jugular venous distention at 45 degrees.  Carotid upstroke  brisk and symmetrical.  No bruits, no thyromegaly.  LYMPHATICS:  No  cervical, axillary, or inguinal adenopathy.  LUNGS:  Clear to auscultation bilaterally.  BACK:  No costovertebral angle tenderness.  CHEST:  Unremarkable.  HEART:  PMI not displaced or sustained.  S1 and S2 within normal limits.  No S3, no S4.  No clicks, no rubs, no murmurs.  ABDOMEN:  Obese,  positive bowel sounds normal in frequency and  pitch.  No bruits, no  rebound, no guarding.  No midline pulsatile mass.  No hepatomegaly, no  splenomegaly.  SKIN:  No rashes, no nodules.  EXTREMITIES:  2+ pulses throughout.  No edema, no cyanosis, no clubbing.  NEUROLOGIC:  Oriented to person, place, and time.  Cranial nerves II  through XII grossly intact.  Motor grossly intact.   ASSESSMENT AND PLAN:  1. Palpitations.  The patient's palpitations are much less severe.      They probably were related to stress.  At this point, we are not      going to try management with any beta-blocker, calcium-blocker as      he has bradycardia and first-degree heart block.  He will let me      know if his palpitations get worse rather than better.  2. Obesity.  I am disappointed that he gained weight.  Hopefully, he      can turn this around.  We have discussed this at great length in      the past.  3. Followup.  He is due to see me again in July and he will keep this      appointment.  He will  see me sooner if he has any increasing      symptoms.     Rollene Rotunda, MD, [facility]  Electronically Signed    JH/MedQ  DD: 02/27/2008  DT: 02/28/2008  Job #: [id number]   cc:   Rosalyn Gess. Norins, MD

## 2010-08-06 ENCOUNTER — Ambulatory Visit: Payer: Medicare Other | Attending: Orthopedic Surgery | Admitting: Physical Therapy

## 2010-08-06 DIAGNOSIS — IMO0001 Reserved for inherently not codable concepts without codable children: Secondary | ICD-10-CM | POA: Insufficient documentation

## 2010-08-06 DIAGNOSIS — M25519 Pain in unspecified shoulder: Secondary | ICD-10-CM | POA: Insufficient documentation

## 2010-08-06 DIAGNOSIS — M25619 Stiffness of unspecified shoulder, not elsewhere classified: Secondary | ICD-10-CM | POA: Insufficient documentation

## 2010-08-13 ENCOUNTER — Ambulatory Visit: Payer: Medicare Other | Admitting: Physical Therapy

## 2010-08-19 ENCOUNTER — Ambulatory Visit: Payer: Medicare Other | Attending: Orthopedic Surgery | Admitting: Physical Therapy

## 2010-08-19 DIAGNOSIS — M25619 Stiffness of unspecified shoulder, not elsewhere classified: Secondary | ICD-10-CM | POA: Insufficient documentation

## 2010-08-19 DIAGNOSIS — IMO0001 Reserved for inherently not codable concepts without codable children: Secondary | ICD-10-CM | POA: Insufficient documentation

## 2010-08-19 DIAGNOSIS — M25519 Pain in unspecified shoulder: Secondary | ICD-10-CM | POA: Insufficient documentation

## 2010-08-27 ENCOUNTER — Ambulatory Visit: Payer: Medicare Other | Admitting: Physical Therapy

## 2010-09-10 ENCOUNTER — Encounter: Payer: 59 | Admitting: Physical Therapy

## 2010-09-11 ENCOUNTER — Encounter: Payer: Self-pay | Admitting: Cardiology

## 2010-09-22 ENCOUNTER — Ambulatory Visit: Payer: Medicare Other | Attending: Orthopedic Surgery | Admitting: Physical Therapy

## 2010-09-22 DIAGNOSIS — M25619 Stiffness of unspecified shoulder, not elsewhere classified: Secondary | ICD-10-CM | POA: Insufficient documentation

## 2010-09-22 DIAGNOSIS — IMO0001 Reserved for inherently not codable concepts without codable children: Secondary | ICD-10-CM | POA: Insufficient documentation

## 2010-09-22 DIAGNOSIS — M25519 Pain in unspecified shoulder: Secondary | ICD-10-CM | POA: Insufficient documentation

## 2010-09-29 ENCOUNTER — Ambulatory Visit: Payer: Medicare Other | Admitting: Physical Therapy

## 2010-10-27 ENCOUNTER — Other Ambulatory Visit: Payer: Self-pay | Admitting: Internal Medicine

## 2011-01-16 ENCOUNTER — Encounter: Payer: Self-pay | Admitting: Internal Medicine

## 2011-01-19 ENCOUNTER — Ambulatory Visit (INDEPENDENT_AMBULATORY_CARE_PROVIDER_SITE_OTHER): Payer: 59 | Admitting: Internal Medicine

## 2011-01-19 ENCOUNTER — Other Ambulatory Visit (INDEPENDENT_AMBULATORY_CARE_PROVIDER_SITE_OTHER): Payer: 59

## 2011-01-19 ENCOUNTER — Ambulatory Visit (INDEPENDENT_AMBULATORY_CARE_PROVIDER_SITE_OTHER)
Admission: RE | Admit: 2011-01-19 | Discharge: 2011-01-19 | Disposition: A | Discharge status: Home / Self Care | Payer: 59 | Source: Ambulatory Visit | Attending: Internal Medicine | Admitting: Internal Medicine

## 2011-01-19 DIAGNOSIS — M1990 Unspecified osteoarthritis, unspecified site: Secondary | ICD-10-CM

## 2011-01-19 DIAGNOSIS — R899 Unspecified abnormal finding in specimens from other organs, systems and tissues: Secondary | ICD-10-CM

## 2011-01-19 DIAGNOSIS — R6889 Other general symptoms and signs: Secondary | ICD-10-CM

## 2011-01-19 DIAGNOSIS — R002 Palpitations: Secondary | ICD-10-CM

## 2011-01-19 DIAGNOSIS — I10 Essential (primary) hypertension: Secondary | ICD-10-CM

## 2011-01-19 DIAGNOSIS — G4730 Sleep apnea, unspecified: Secondary | ICD-10-CM

## 2011-01-19 DIAGNOSIS — E785 Hyperlipidemia, unspecified: Secondary | ICD-10-CM

## 2011-01-19 DIAGNOSIS — Z87898 Personal history of other specified conditions: Secondary | ICD-10-CM

## 2011-01-19 DIAGNOSIS — R319 Hematuria, unspecified: Secondary | ICD-10-CM

## 2011-01-19 DIAGNOSIS — K279 Peptic ulcer, site unspecified, unspecified as acute or chronic, without hemorrhage or perforation: Secondary | ICD-10-CM

## 2011-01-19 DIAGNOSIS — M109 Gout, unspecified: Secondary | ICD-10-CM

## 2011-01-19 DIAGNOSIS — Z0000 Encounter for general adult medical examination without abnormal findings: Secondary | ICD-10-CM

## 2011-01-19 LAB — CBC WITH DIFFERENTIAL/PLATELET
Basophils Absolute: 0 10*3/uL (ref 0.0–0.1)
Eosinophils Absolute: 0.1 10*3/uL (ref 0.0–0.7)
Lymphocytes Relative: 28.9 % (ref 12.0–46.0)
MCHC: 31.5 g/dL (ref 30.0–36.0)
Monocytes Relative: 13.3 % — ABNORMAL HIGH (ref 3.0–12.0)
Neutrophils Relative %: 55.7 % (ref 43.0–77.0)
Platelets: 193 10*3/uL (ref 150.0–400.0)
RDW: 19.2 % — ABNORMAL HIGH (ref 11.5–14.6)

## 2011-01-19 LAB — COMPREHENSIVE METABOLIC PANEL
ALT: 26 U/L (ref 0–53)
AST: 25 U/L (ref 0–37)
Albumin: 4.1 g/dL (ref 3.5–5.2)
CO2: 28 mEq/L (ref 19–32)
Calcium: 9.9 mg/dL (ref 8.4–10.5)
Chloride: 105 mEq/L (ref 96–112)
Creatinine, Ser: 1.1 mg/dL (ref 0.4–1.5)
GFR: 67.16 mL/min (ref >60.00)
Potassium: 5 mEq/L (ref 3.5–5.1)
Sodium: 140 mEq/L (ref 135–145)
Total Protein: 7.4 g/dL (ref 6.0–8.3)

## 2011-01-19 LAB — HIGH SENSITIVITY CRP: CRP, High Sensitivity: 2.55 mg/L (ref 0.000–5.000)

## 2011-01-19 LAB — URIC ACID: Uric Acid, Serum: 5.5 mg/dL (ref 4.0–7.8)

## 2011-01-19 LAB — LIPID PANEL
Total CHOL/HDL Ratio: 3
Triglycerides: 57 mg/dL (ref 0.0–149.0)

## 2011-01-19 NOTE — Assessment & Plan Note (Signed)
Has had full evaluation that was negative. He has had recent recurrence and did see urology for follow-up that was negative including follow-up PSA.  Plan - defer any additional evaluation to urology.

## 2011-01-19 NOTE — Progress Notes (Signed)
Subjective:    Patient ID: Jacob [name], male    DOB: 1933-08-08, 77 y.o.   MRN: [id number]  HPI  The patient is here for annual Medicare wellness examination and management of other chronic and acute problems.  He has had no flares of gout but he hasn't had a uric acid level since starting allopurinol.  Interested in having lipid-reviewed labs - has had mildly elevated LDL; assay for diabetes- reviewed record: A1C '07 5.6%.  He reports that he has not had any palpitations or irregular rhythm - which attributes to Co-Q-10.  He has had a full evaluation for hematuria including cystoscopy which was unrevealing. He did have recent recurrence - went to the urologist: PSA normal and urine cytology negative.    The risk factors are reflected in the social history.  The roster of all physicians providing medical care to patient - is listed in the Snapshot section of the chart.  Activities of daily living:  The patient is 100% inedpendent in all ADLs: dressing, toileting, feeding as well as independent mobility  Home safety : The patient has smoke detectors in the home. They wear seatbelts. No firearms at home.  There is no risks for hepatitis, STDs or HIV. There is history of blood transfusion. They have no travel history to infectious disease endemic areas of the world.  The patient has not seen their dentist in the last six month. They have seen their eye doctor in the last year. They deny any hearing difficulty and have not had audiologic testing in the last year.  They do not  have excessive sun exposure. Discussed the need for sun protection: hats, long sleeves and use of sunscreen if there is significant sun exposure.   Diet: the importance of a healthy diet is discussed. They do have a healthy (unhealthy-high fat/fast food) diet.  The patient has a regular exercise program: cardio , 25 min to 1 and a half hour duration, three times per week.  The benefits of regular aerobic  exercise were discussed.  Depression screen: there are no signs or vegative symptoms of depression- irritability, change in appetite, anhedonia, sadness/tearfullness.  Cognitive assessment: the patient manages all their financial and personal affairs and is actively engaged. They could relate day,date,year and events; recalled 3/3 objects at 3 minutes; performed clock-face test normally.  The following portions of the patient's history were reviewed and updated as appropriate: allergies, current medications, past family history, past medical history,  past surgical history, past social history  and problem list.  Vision, hearing, body mass index were assessed and reviewed.   During the course of the visit the patient was educated and counseled about appropriate screening and preventive services including : fall prevention , diabetes screening, nutrition counseling, colorectal cancer screening, and recommended immunizations.  Past Medical History  Diagnosis Date  . Benign prostatic hypertrophy     hx of  . Other and unspecified hyperlipidemia   . Personal history of unspecified disorders of nervous system and sense organs   . Peptic ulcer, unspecified site, unspecified as acute or chronic, without mention of hemorrhage, perforation, or obstruction   . Mitral valve disorders   . Unspecified sleep apnea   . Morbid obesity   . Personal history of urinary calculi   . Diverticulosis of colon (without mention of hemorrhage)   . Other thalassemia   . First degree atrioventricular block   . Gout   . Osteoarthritis   . Basal cell carcinoma   .  Anemia   . GERD (gastroesophageal reflux disease)    Past Surgical History  Procedure Date  . Tumor removed     from finger- left index  . Moles removed   . Ankle surgery     ORIF right '78  . Cataract extraction, bilateral     hx of  . Gastrectomy     hx of '56   Family History  Problem Relation Age of Onset  . Coronary artery disease  Father   . Heart attack Father   . Gout Father    History   Social History  . Marital Status: Married    Spouse Name: N/A    Number of Children: 8  . Years of Education: N/A   Occupational History  . Not on file.   Social History Main Topics  . Smoking status: Not on file  . Smokeless tobacco: Not on file  . Alcohol Use: No  . Drug Use: No  . Sexually Active: Not on file   Other Topics Concern  . Not on file   Social History Narrative   [employer] [place]; [place]ried '59Eight Daughters '60, '62, '66, '70, '73, '74, '78, '83; 13 grandchildrenLives independ. With wifeAlcohol use- no Drug use- no       Review of Systems Constitutional:  Negative for fever, chills, activity change and unexpected weight change.  HEENT:  Negative for hearing loss, ear pain, congestion, neck stiffness and postnasal drip. Negative for sore throat or swallowing problems. Negative for dental complaints.   Eyes: Negative for vision loss or change in visual acuity.  Respiratory: Negative for chest tightness and wheezing. Negative for DOE.   Cardiovascular: Negative for chest pain or palpitations. No decreased exercise tolerance Gastrointestinal: No change in bowel habit. No bloating or gas. No reflux or indigestion Genitourinary: Negative for urgency, frequency, flank pain and difficulty urinating.  Musculoskeletal: Negative for myalgias, back pain, arthralgias and gait problem.  Neurological: Negative for dizziness, tremors, weakness and headaches.  Hematological: Negative for adenopathy.  Psychiatric/Behavioral: Negative for behavioral problems and dysphoric mood.       Objective:   Physical Exam Vitals-reviewed - stable. Gen'l- overweight white male in no acute distress who has visibly lost weight HEENT - Beecher/AT, C&S clear, PERRLA, EACs clear, TMs normal, oropharynx w/o lesions Neck - supple, no thyromegaly,  Nodes - negative cervical and supraclavicular Chest - no  deformity Pulm - normal respirations, no rales or wheezes Cor- 2+ radial and DP pulses, RRR w/o murmur Abdomen - obese, BS+, soft, w/0 HSM Genitalia - deferred to urology Ext- valgus deformity distal fingers 3rd and 2nd right, 2nd left with  Bouchard's nodes, no synovial thickening, no erythema or heat to the joints, preserved ROM Neuro - A&O x 3, Cognition normal, CN II-XII normal Derm - no lesions head, neck. UE with many "age spots" but no suspicious lesions.   Lab Results  Component Value Date   WBC 6.7 01/19/2011   HGB 12.8* 01/19/2011   HCT 40.5 01/19/2011   PLT 193.0 01/19/2011   GLUCOSE 87 01/19/2011   CHOL 200 01/19/2011   TRIG 57.0 01/19/2011   HDL 59.60 01/19/2011   LDLDIRECT 145.0 08/29/2007   LDLCALC 129* 01/19/2011   ALT 26 01/19/2011   AST 25 01/19/2011   NA 140 01/19/2011   K 5.0 01/19/2011   CL 105 01/19/2011   CREATININE 1.1 01/19/2011   BUN 26* 01/19/2011   CO2 28 01/19/2011   TSH 2.59 08/29/2007  HGBA1C 5.8 01/19/2011        CRP high sensitivity  2.550   (nl)                                                  01/19/2011   *RADIOLOGY REPORT*  Clinical Data: Osteoarthritis. Rule out joint destruction  LEFT HAND - COMPLETE 3+ VIEW  Comparison: None  Findings: There is advanced osteoarthritic changes involving the  DIP joints of the second through fifth digits.  The changes include marked joint space narrowing and exuberant spur  formation.  There is no evidence for bony destruction or osteomyelitis.  IMPRESSION:  1. Advanced osteoarthritis.  2. No destructive bone changes noted.  Original Report Authenticated By: Rosealee Albee, M.D.  *RADIOLOGY REPORT*  Clinical Data: Osteoarthritis  RIGHT HAND - COMPLETE 3+ VIEW  Comparison: None  Findings:  There is moderate to advanced osteoarthritis involving the DIP  joints of the second through fifth digits.  Changes include marked joint space narrowing and exuberant spur  formation.  There is lateral subluxation of the  third DIP joint.  The second and third DIP joints are slightly fixed in flexion.  IMPRESSION:  1. Moderate to marked osteoarthritis involving the DIP joints as  above.  Original Report Authenticated By: Rosealee Albee, M.D.         Assessment & Plan:

## 2011-01-19 NOTE — Assessment & Plan Note (Signed)
Patient reports that he has not noted any palpitations or abnormal heart beats/rhythm.  Plan - keep follow-up with Dr. Antoine Poche as scheduled.

## 2011-01-19 NOTE — Assessment & Plan Note (Signed)
Definite OA. Rheumatoid factor is pending but CRP is normal range, x-rays of the hands without characteristic changes of rheumatoid arthritis but very consistent with osteoarthritis.  Plan - otc NSAIDs as needed           Warm soaks in the AM to shorten AM gel period           Gentle Range of motion exercise for hands daily.

## 2011-01-19 NOTE — Assessment & Plan Note (Signed)
Hemoglobin is stable and normal. No complaints of GI distress or signs of bleeding.

## 2011-01-19 NOTE — Assessment & Plan Note (Signed)
LDL is at goal for non-cardiac, non-diabetic, non-smoking patient which is 130 or less.  Plan - a prudent diet           Medical therapy is not indicated.

## 2011-01-19 NOTE — Assessment & Plan Note (Signed)
BP Readings from Last 3 Encounters:  01/19/11 134/62  06/25/10 120/70  10/14/09 128/68   Good control.  Plan- continue present regimen.

## 2011-01-19 NOTE — Assessment & Plan Note (Signed)
Wt Readings from Last 3 Encounters:  01/19/11 261 lb (118.389 kg)  06/25/10 274 lb (124.286 kg)  10/14/09 285 lb (129.275 kg)   Fabulous weight loss - 24 lbs in 15 months.   Plan - continued weight management - PORTION SIZE CONTROL is the biggest step.           KEEP UP THE GOOD WORK.

## 2011-01-19 NOTE — Assessment & Plan Note (Signed)
No flares of gout in interval since last visit. Uric Acid level is 5.5 - normal and improved from pre-treatment level of 10.   Plan - continue allopurinol.

## 2011-01-19 NOTE — Assessment & Plan Note (Signed)
Doing well with CPAP with improved quality of life. He will see Dr. Shelle Iron as instructed.

## 2011-01-20 DIAGNOSIS — Z0000 Encounter for general adult medical examination without abnormal findings: Secondary | ICD-10-CM | POA: Insufficient documentation

## 2011-01-20 LAB — RHEUMATOID FACTOR: Rhuematoid fact SerPl-aCnc: <10 IU/mL (ref <=14)

## 2011-01-20 NOTE — Assessment & Plan Note (Signed)
Interval history unremarkable. Physical exam is normal except for weight, which is improved as noted. Lab results generally in normal range. Current with colorectal cancer screening with last study in Jan '10. Prostate health is followed by urology and he is current. Immunizations: pneumonia vaccine March '10; due for Tdap and Shingles vaccine.   In summary - a very pleasant man who is working hard on maintaining his health. He does exercise, he is adherent to his regimen. He is advised to continue on a weight management program and to watch PORTION SIZE. He will return as needed or in 1 year for a general exam.

## 2011-01-22 ENCOUNTER — Ambulatory Visit (INDEPENDENT_AMBULATORY_CARE_PROVIDER_SITE_OTHER): Payer: 59 | Admitting: Cardiology

## 2011-01-22 ENCOUNTER — Encounter: Payer: Self-pay | Admitting: Cardiology

## 2011-01-22 VITALS — BP 128/74 | HR 58 | Ht 67.0 in | Wt 263.8 lb

## 2011-01-22 DIAGNOSIS — Z87898 Personal history of other specified conditions: Secondary | ICD-10-CM

## 2011-01-22 DIAGNOSIS — E785 Hyperlipidemia, unspecified: Secondary | ICD-10-CM

## 2011-01-22 DIAGNOSIS — I440 Atrioventricular block, first degree: Secondary | ICD-10-CM

## 2011-01-22 DIAGNOSIS — R002 Palpitations: Secondary | ICD-10-CM

## 2011-01-22 NOTE — Assessment & Plan Note (Signed)
He is not bothered by these at present.  I will not change his therapy.

## 2011-01-22 NOTE — Assessment & Plan Note (Signed)
He has no symptomatic bradyarrhythmias.  No change in therapy is indicated.

## 2011-01-22 NOTE — Assessment & Plan Note (Signed)
Lab Results  Component Value Date   CHOL 200 01/19/2011   HDL 59.60 01/19/2011   LDLCALC 129* 01/19/2011   LDLDIRECT 145.0 08/29/2007   TRIG 57.0 01/19/2011   CHOLHDL 3 01/19/2011   He does not think that this was drawn properly and he was not fasting.  I will repeat this fasting.

## 2011-01-22 NOTE — Progress Notes (Signed)
HPI The patient presents for followup of palpitations. Since I last saw him he actually has felt quite well. He's had no palpitations, presyncope or syncope. He has had no chest pressure, neck or arm discomfort. He has had no shortness of breath, PND or orthopnea. He has had no weight gain or edema. 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 However, he's been able to do some vigorous exercise since last saw him without any symptoms.  Allergies  Allergen Reactions  . Prednisone     REACTION: GI bleed  . Sulfonamide Derivatives     REACTION: Rash    Current Outpatient Prescriptions  Medication Sig Dispense Refill  . allopurinol (ZYLOPRIM) 300 MG tablet TAKE 1 TABLET BY MOUTH ONCE A DAY  30 tablet  5  . aspirin 81 MG tablet Take 81 mg by mouth daily.        . cetirizine (ZYRTEC) 10 MG tablet Take 10 mg by mouth daily.        . Cholecalciferol (VITAMIN D3) 2000 UNITS capsule Take 2,000 Units by mouth daily.        . Coenzyme Q-10 100 MG capsule Take 300 mg by mouth daily.       . colchicine 0.6 MG tablet Take 0.6 mg by mouth as needed.        . Cyanocobalamin (VITAMIN B 12 PO) Take by mouth.        . diphenhydramine-acetaminophen (TYLENOL PM) 25-500 MG TABS Take 2 tablets by mouth at bedtime as needed.        . fish oil-omega-3 fatty acids 1000 MG capsule Take 600 mg by mouth daily.       . folic acid ([employer] FOLIC ACID) 400 MCG tablet Take 400 mcg by mouth daily.        Marland Kitchen omeprazole (PRILOSEC) 20 MG capsule Take 20 mg by mouth daily.        . saw palmetto 160 MG capsule Take 160 mg by mouth 2 (two) times daily.          Past Medical History  Diagnosis Date  . Benign prostatic hypertrophy     hx of  . Other and unspecified hyperlipidemia   . Personal history of unspecified disorders of nervous system and sense organs   . Peptic ulcer, unspecified site, unspecified as acute or chronic, without mention of hemorrhage,  perforation, or obstruction   . Mitral valve disorders   . Unspecified sleep apnea   . Morbid obesity   . Personal history of urinary calculi   . Diverticulosis of colon (without mention of hemorrhage)   . Other thalassemia   . First degree atrioventricular block   . Gout   . Osteoarthritis   . Basal cell carcinoma   . Anemia   . GERD (gastroesophageal reflux disease)     Past Surgical History  Procedure Date  . Tumor removed     from finger- left index  . Moles removed   . Ankle surgery     ORIF right '78  . Cataract extraction, bilateral     hx of  . Gastrectomy     hx of '56    ROS:  As stated in the HPI and negative for all other systems.  PHYSICAL EXAM BP 128/74  Pulse 58  Ht 5\' 7"  (1.702 m)  Wt 263 lb 12.8 oz (119.659 kg)  BMI 41.32 kg/m2 GENERAL:  Well appearing HEENT:  Pupils equal round and reactive, fundi not visualized, oral mucosa unremarkable NECK:  No jugular venous distention, waveform within normal limits, carotid upstroke brisk and symmetric, no bruits, no thyromegaly LYMPHATICS:  No cervical, inguinal adenopathy LUNGS:  Clear to auscultation bilaterally BACK:  No CVA tenderness CHEST:  Unremarkable HEART:  PMI not displaced or sustained,S1 and S2 within normal limits, no S3, no S4, no clicks, no rubs, no murmurs ABD:  Flat, positive bowel sounds normal in frequency in pitch, no bruits, no rebound, no guarding, no midline pulsatile mass, no hepatomegaly, no splenomegaly, obese EXT:  2 plus pulses throughout, no edema, no cyanosis no clubbing SKIN:  No rashes no nodules NEURO:  Cranial nerves II through XII grossly intact, motor grossly intact throughout PSYCH:  Cognitively intact, oriented to person place and time   EKG:  Normal sinus rhythm, rate 58, left axis deviation, significant first degree AV block, poor anterior R wave progression, no change from previous. 01/22/2011   ASSESSMENT AND PLAN

## 2011-01-22 NOTE — Assessment & Plan Note (Signed)
The patient understands the need to lose weight with diet and exercise. We have discussed specific strategies for this. Wt Readings from Last 3 Encounters:  01/22/11 263 lb 12.8 oz (119.659 kg)  01/19/11 261 lb (118.389 kg)  06/25/10 274 lb (124.286 kg)

## 2011-01-22 NOTE — Patient Instructions (Signed)
Your physician recommends that you return for lab work in:  1 week for Fasting Lipid profile.  Continue current medications  Please schedule a follow up appointment in 1 year with Dr. Antoine Poche

## 2011-01-26 ENCOUNTER — Encounter: Payer: Self-pay | Admitting: Internal Medicine

## 2011-01-27 ENCOUNTER — Other Ambulatory Visit (INDEPENDENT_AMBULATORY_CARE_PROVIDER_SITE_OTHER): Payer: Medicare Other | Admitting: *Deleted

## 2011-01-27 DIAGNOSIS — E785 Hyperlipidemia, unspecified: Secondary | ICD-10-CM

## 2011-01-27 LAB — LIPID PANEL
Cholesterol: 171 mg/dL (ref 0–200)
Triglycerides: 47 mg/dL (ref 0.0–149.0)
VLDL: 9.4 mg/dL (ref 0.0–40.0)

## 2011-02-02 ENCOUNTER — Encounter: Payer: Self-pay | Admitting: *Deleted

## 2011-02-02 ENCOUNTER — Telehealth: Payer: Self-pay | Admitting: Cardiology

## 2011-02-02 NOTE — Telephone Encounter (Signed)
Results reviewed with pt.  Copy mailed to pts home address

## 2011-02-02 NOTE — Telephone Encounter (Signed)
Pt still has not heard from Dr. Antoine Poche regarding his labs. Per pt he must have forget he was to call pt with results

## 2011-02-13 ENCOUNTER — Ambulatory Visit (INDEPENDENT_AMBULATORY_CARE_PROVIDER_SITE_OTHER)
Admission: RE | Admit: 2011-02-13 | Discharge: 2011-02-13 | Disposition: A | Discharge status: Home / Self Care | Payer: Medicare Other | Source: Ambulatory Visit | Attending: Internal Medicine | Admitting: Internal Medicine

## 2011-02-13 ENCOUNTER — Ambulatory Visit (INDEPENDENT_AMBULATORY_CARE_PROVIDER_SITE_OTHER): Payer: Medicare Other | Admitting: Internal Medicine

## 2011-02-13 ENCOUNTER — Encounter: Payer: Self-pay | Admitting: Internal Medicine

## 2011-02-13 DIAGNOSIS — R05 Cough: Secondary | ICD-10-CM

## 2011-02-13 DIAGNOSIS — J209 Acute bronchitis, unspecified: Secondary | ICD-10-CM | POA: Insufficient documentation

## 2011-02-13 MED ORDER — CEFUROXIME AXETIL 500 MG PO TABS: 500.0000 mg | ORAL_TABLET | Freq: Two times a day (BID) | ORAL | Status: AC

## 2011-02-13 MED ORDER — PSEUDOEPH-CHLORPHEN-HYDROCOD 60-4-5 MG/5ML PO SOLN: 5.0000 mL | Freq: Four times a day (QID) | ORAL | Status: DC | PRN

## 2011-02-13 NOTE — Assessment & Plan Note (Signed)
Check a CXR for pna, mass , edema, etc

## 2011-02-13 NOTE — Patient Instructions (Signed)

## 2011-02-13 NOTE — Progress Notes (Signed)
Subjective:    Patient ID: Jacob [name], male    DOB: 09/15/1933, 77 y.o.   MRN: [id number]  Cough This is a new problem. The current episode started today (one week ago). The problem has been gradually worsening. The problem occurs every few hours. The cough is productive of purulent sputum. Associated symptoms include chills, nasal congestion, rhinorrhea and a sore throat. Pertinent negatives include no chest pain, ear congestion, ear pain, fever, headaches, heartburn, hemoptysis, myalgias, postnasal drip, rash, shortness of breath, sweats, weight loss or wheezing. The symptoms are aggravated by nothing. He has tried OTC cough suppressant for the symptoms. The treatment provided no relief. His past medical history is significant for pneumonia.      Review of Systems  Constitutional: Positive for chills. Negative for fever, weight loss, diaphoresis, activity change, appetite change, fatigue and unexpected weight change.  HENT: Positive for congestion, sore throat, rhinorrhea and sinus pressure. Negative for hearing loss, ear pain, nosebleeds, facial swelling, sneezing, trouble swallowing, neck pain, neck stiffness, voice change, postnasal drip, tinnitus and ear discharge.   Eyes: Negative.   Respiratory: Positive for cough. Negative for apnea, hemoptysis, choking, chest tightness, shortness of breath, wheezing and stridor.   Cardiovascular: Negative for chest pain, palpitations and leg swelling.  Gastrointestinal: Negative for heartburn, nausea, vomiting, abdominal pain, diarrhea, constipation and blood in stool.  Genitourinary: Negative for dysuria, urgency, frequency, flank pain, decreased urine volume, enuresis and difficulty urinating.  Musculoskeletal: Negative for myalgias, back pain, joint swelling, arthralgias and gait problem.  Skin: Negative for color change, pallor, rash and wound.  Neurological: Negative for dizziness, tremors, seizures, syncope, facial asymmetry, speech  difficulty, weakness, light-headedness, numbness and headaches.  Hematological: Negative for adenopathy. Does not bruise/bleed easily.  Psychiatric/Behavioral: Negative.        Objective:   Physical Exam  Constitutional: He is oriented to person, place, and time. He appears well-developed and well-nourished. No distress.  HENT:  Head: Normocephalic and atraumatic.  Right Ear: Hearing, tympanic membrane, external ear and ear canal normal.  Left Ear: Hearing, external ear and ear canal normal.  Nose: Mucosal edema and rhinorrhea present. No nose lacerations, sinus tenderness, nasal deformity, septal deviation or nasal septal hematoma. No epistaxis.  No foreign bodies. Right sinus exhibits maxillary sinus tenderness. Right sinus exhibits no frontal sinus tenderness. Left sinus exhibits maxillary sinus tenderness. Left sinus exhibits no frontal sinus tenderness.  Mouth/Throat: Mucous membranes are normal. Mucous membranes are not pale, not dry and not cyanotic. Posterior oropharyngeal erythema present. No oropharyngeal exudate, posterior oropharyngeal edema or tonsillar abscesses.  Eyes: Conjunctivae are normal. Right eye exhibits no discharge. Left eye exhibits no discharge. No scleral icterus.  Neck: Normal range of motion. Neck supple. No JVD present. No tracheal deviation present. No thyromegaly present.  Cardiovascular: Normal rate, regular rhythm, normal heart sounds and intact distal pulses.  Exam reveals no gallop and no friction rub.   No murmur heard. Pulmonary/Chest: Effort normal and breath sounds normal. No stridor. No respiratory distress. He has no wheezes. He has no rales. He exhibits no tenderness.  Abdominal: Soft. Bowel sounds are normal. He exhibits no distension and no mass. There is no tenderness. There is no rebound and no guarding.  Musculoskeletal: Normal range of motion. He exhibits no edema and no tenderness.  Lymphadenopathy:    He has no cervical adenopathy.    Neurological: He is oriented to person, place, and time.  Skin: Skin is warm and dry. No rash noted. He is not  diaphoretic. No erythema. No pallor.  Psychiatric: He has a normal mood and affect. His behavior is normal. Judgment and thought content normal.          Assessment & Plan:

## 2011-02-13 NOTE — Assessment & Plan Note (Signed)
Start antibiotics for the infection and zutripro for the cough and congestion

## 2011-02-16 ENCOUNTER — Ambulatory Visit (INDEPENDENT_AMBULATORY_CARE_PROVIDER_SITE_OTHER): Payer: Medicare Other | Admitting: Internal Medicine

## 2011-02-16 ENCOUNTER — Encounter: Payer: Self-pay | Admitting: Internal Medicine

## 2011-02-16 VITALS — BP 122/68 | HR 65 | Temp 97.8°F | Wt 267.0 lb

## 2011-02-16 DIAGNOSIS — Z23 Encounter for immunization: Secondary | ICD-10-CM

## 2011-02-16 DIAGNOSIS — J069 Acute upper respiratory infection, unspecified: Secondary | ICD-10-CM

## 2011-02-16 MED ORDER — PROMETHAZINE-CODEINE 6.25-10 MG/5ML PO SYRP: 5.0000 mL | ORAL_SOLUTION | ORAL | Status: AC | PRN

## 2011-02-16 MED ORDER — ZOSTER VACCINE LIVE 19400 UNT/0.65ML ~~LOC~~ SOLR
0.6500 mL | Freq: Once | SUBCUTANEOUS | Status: AC
  Administered 2011-02-16: 19400 [IU] via SUBCUTANEOUS

## 2011-02-16 NOTE — Patient Instructions (Signed)
Persistent cough-on today's exam there is no clear evidence of a persistent bacterial infection, but things may have changed with 3 days of antibiotics. Plan - complete the cefuroxine; for cough take mucinex twice a day, take promethazine with codeine as directed for cough, take benzonatate perles fore the tickle cough. You may want to try a different antihistamine, e.g. claritin or allegra to help the drainage. For continued nosebleeds you will need to see ENT - let me know and we can help get the appointment.   Shingles vaccine today.  Corrections to record made.   Cholesterol - please google NCEP ATP III for the recommendations that I follow.

## 2011-02-16 NOTE — Progress Notes (Signed)
  Subjective:    Patient ID: Jacob [name], male    DOB: 01/10/1934, 77 y.o.   MRN: [id number]  HPI Jacob [name] has had an URI infection and was seen Friday by Dr. Yetta Barre - he was started on cefuroxime, hydrocodone for cough and he had mucinex and hydrocodone cough syrup. He has not been able to sleep due to cough. His cough is productive of a thick yellow sputum, no bitter or metalic taste. His sleep cycle has been disrupted - since he can't sleep he is cat-napping. He has had recurrent epistaxis. This can be controlled with vaseline impregnated tissue.   He wants the shingles vaccine.   I have reviewed the patient's medical history in detail and updated the computerized patient record.    Review of Systems System review is negative for any constitutional, cardiac, pulmonary, GI or neuro symptoms or complaints other than as described in the HPI.     Objective:   Physical Exam Vitals stable Gen'l overweight white man in no distress HEENT- no sinus tenderness, C&S clear Pulm- normal respirations. No rales, wheezes,rhonchi Cor- 2+ radial RRR Neuro- A&O x 3, CN II-XII normal, normal gait.       Assessment & Plan:

## 2011-02-17 NOTE — Assessment & Plan Note (Signed)
Recently diagnosed by Dr. Yetta Barre. He seems to be doing well except for rhinorrhea and cough.  Plan - complete antibiotic as prescribed           Gave instructions for supportive care           Prom/cod for cough

## 2011-02-25 ENCOUNTER — Ambulatory Visit (INDEPENDENT_AMBULATORY_CARE_PROVIDER_SITE_OTHER): Payer: Medicare Other | Admitting: Internal Medicine

## 2011-02-25 VITALS — BP 128/64 | HR 53 | Temp 97.8°F | Wt 263.0 lb

## 2011-02-25 DIAGNOSIS — J029 Acute pharyngitis, unspecified: Secondary | ICD-10-CM

## 2011-02-25 NOTE — Progress Notes (Signed)
  Subjective:    Patient ID: Jacob [name], male    DOB: 1933-10-09, 77 y.o.   MRN: [id number]  HPI Jacob [name] presents for persistent sore throat for the past six days. He has been treated with ceftin but he still has discomfort. He uses humidified CPAP and has no throat pain in the AM. As the day progresses he will have a sore throat. No fever, chills, myalgias,  SOB, enlarged nodes. House has gas/electic and it is dry. No sweats, weight loss, dysphagia.  I have reviewed the patient's medical history in detail and updated the computerized patient record.    Review of Systems System review is negative for any constitutional, cardiac, pulmonary, GI or neuro symptoms or complaints other than as described in the HPI.     Objective:   Physical Exam Vitals reveiwed - afebrile Gen'l heavset white man HEENT - posterior pharynx w/o exudate, erythema Nodes - negative cervical region Pulm - normal function       Assessment & Plan:  Sore throat. Doubt bacterial infection since he has completed ceftin, has no fever, nodes, etc. Concern for viral infection, yeast pharyngitis, ulceration vs other. Plan - for continued pain will refer to ENT for diagnostic exam

## 2011-03-16 ENCOUNTER — Ambulatory Visit (INDEPENDENT_AMBULATORY_CARE_PROVIDER_SITE_OTHER): Payer: Medicare Other | Admitting: Internal Medicine

## 2011-03-16 DIAGNOSIS — R059 Cough, unspecified: Secondary | ICD-10-CM

## 2011-03-16 DIAGNOSIS — R05 Cough: Secondary | ICD-10-CM

## 2011-03-16 MED ORDER — BENZONATATE 100 MG PO CAPS: 100.0000 mg | ORAL_CAPSULE | Freq: Three times a day (TID) | ORAL | Status: DC | PRN

## 2011-03-16 NOTE — Progress Notes (Signed)
  Subjective:    Patient ID: PARRY PO, male    DOB: 09-Dec-1933, 77 y.o.   MRN: [id number]  HPI Just completed treatment for a persistent cough. He did get better and he was very energetic, etc. But has had a relapse with cough, chills but no documented fever. He does get relief from NSAIDs. He may have an emerging cyclical cough but there is no evidence of a bacterial infection.    Review of Systems System review is negative for any constitutional, cardiac, pulmonary, GI or neuro symptoms or complaints other than as described in the HPI.     Objective:   Physical Exam Afebrile pulm - no distress Cor - rrr       Assessment & Plan:

## 2011-03-19 NOTE — Assessment & Plan Note (Signed)
Recurrent cough. Plan - promethazine/cod syrup as needed, tessalon perles and NSAIDs.

## 2011-05-20 ENCOUNTER — Other Ambulatory Visit: Payer: Self-pay

## 2011-05-20 MED ORDER — ALLOPURINOL 300 MG PO TABS: 300.0000 mg | ORAL_TABLET | Freq: Every day | ORAL | Status: DC

## 2011-06-29 ENCOUNTER — Ambulatory Visit: Payer: Medicare Other | Admitting: Internal Medicine

## 2011-08-24 ENCOUNTER — Other Ambulatory Visit: Payer: Self-pay | Admitting: *Deleted

## 2011-08-24 MED ORDER — ALLOPURINOL 300 MG PO TABS: 300.0000 mg | ORAL_TABLET | Freq: Every day | ORAL | Status: DC

## 2011-08-24 NOTE — Telephone Encounter (Signed)
Rx refill sent to [facility]ol

## 2011-12-11 DIAGNOSIS — H02839 Dermatochalasis of unspecified eye, unspecified eyelid: Secondary | ICD-10-CM | POA: Insufficient documentation

## 2011-12-11 DIAGNOSIS — Z961 Presence of intraocular lens: Secondary | ICD-10-CM | POA: Insufficient documentation

## 2011-12-31 ENCOUNTER — Ambulatory Visit (INDEPENDENT_AMBULATORY_CARE_PROVIDER_SITE_OTHER): Payer: Medicare Other | Admitting: Internal Medicine

## 2011-12-31 ENCOUNTER — Encounter: Payer: Self-pay | Admitting: Internal Medicine

## 2011-12-31 VITALS — BP 124/68 | HR 60 | Temp 97.3°F | Resp 16 | Wt 273.0 lb

## 2011-12-31 DIAGNOSIS — M109 Gout, unspecified: Secondary | ICD-10-CM

## 2011-12-31 DIAGNOSIS — Z23 Encounter for immunization: Secondary | ICD-10-CM

## 2011-12-31 DIAGNOSIS — E785 Hyperlipidemia, unspecified: Secondary | ICD-10-CM

## 2011-12-31 DIAGNOSIS — N400 Enlarged prostate without lower urinary tract symptoms: Secondary | ICD-10-CM

## 2011-12-31 DIAGNOSIS — M1990 Unspecified osteoarthritis, unspecified site: Secondary | ICD-10-CM

## 2011-12-31 DIAGNOSIS — Z87898 Personal history of other specified conditions: Secondary | ICD-10-CM

## 2011-12-31 DIAGNOSIS — Z0000 Encounter for general adult medical examination without abnormal findings: Secondary | ICD-10-CM

## 2011-12-31 MED ORDER — DIPHENHYDRAMINE-APAP (SLEEP) 25-500 MG PO TABS: 2.0000 | ORAL_TABLET | Freq: Every evening | ORAL | Status: DC | PRN

## 2011-12-31 NOTE — Patient Instructions (Addendum)
Sleep - the sleep hygiene is the best but tylenol PM is OK  Sleep is a learned or unlearned behavior. 5 principles of sleep hygiene - 1) regular hour to retire and rise 7days/wk 2) no stimulants - caffeine, chocolat, alcohol, 3) regular exercise  - every afternoon  4) sleep sanctuary - a space that is right light, temperature, sound level, good bed where all you do is sleep. 5) No extinction behaviors, e.g. Laying in bed awake doing anything but sleeping. This means if you have a bad night - no naps, etc  Personality - keep what you've got but find a happier channel (that is not a mystic card reader)  Weight management - forget the "what the hell" and make smart food choices, portion size control and calorie burn. Loose 1-2 lbs a month.   General  Health - looks like you are doing OK. Full report to follow.

## 2011-12-31 NOTE — Progress Notes (Signed)
Subjective:    Patient ID: Jacob [name], male    DOB: 01/29/1934, 78 y.o.   MRN: [id number]  HPI The patient is here for annual Medicare wellness examination and management of other chronic and acute problems.  Sleep issues - has poor sleep habit. He found that benadryl 50 mg lead to partial urinary retention but he does better with Tylenol PM. Revisited the principles of sleep hygiene  He has a self-described Type A behavior and is frustrated in regard to finding a productive outlet for his energy level. However, he does seem depressed or anxious.   The risk factors are reflected in the social history.  The roster of all physicians providing medical care to patient - is listed in the Snapshot section of the chart.  Activities of daily living:  The patient is 100% inedpendent in all ADLs: dressing, toileting, feeding as well as independent mobility  Home safety : The patient has smoke detectors in the home. They wear seatbelts. No firearms at home. There is no violence in the home.   There is no risks for hepatitis, STDs or HIV. There is no   history of blood transfusion. They have no travel history to infectious disease endemic areas of the world.  The patient has seen their dentist in the last six month. They have seen their eye doctor in the last year. They deny any hearing difficulty and have not had audiologic testing in the last year.   They do not  have excessive sun exposure. Discussed the need for sun protection: hats, long sleeves and use of sunscreen if there is significant sun exposure.   Diet: the importance of a healthy diet is discussed. They do have a healthy diet.  The patient has no regular exercise program.  The benefits of regular aerobic exercise were discussed. He is encouraged to make this a joint activity with his wife and water exercise is recommended.  Depression screen: there are no signs or vegative symptoms of depression- irritability, change in  appetite, anhedonia, sadness/tearfullness. See comments on frustration above.  Cognitive assessment: the patient manages all their financial and personal affairs and is actively engaged.   During the course of the visit the patient was educated and counseled about appropriate screening and preventive services including : fall prevention , diabetes screening, nutrition counseling, colorectal cancer screening, and recommended immunizations.  Past Medical History  Diagnosis Date  . Benign prostatic hypertrophy     hx of  . Other and unspecified hyperlipidemia   . Personal history of unspecified disorders of nervous system and sense organs   . Mitral valve disorders   . Unspecified sleep apnea   . Morbid obesity   . Personal history of urinary calculi   . Diverticulosis of colon (without mention of hemorrhage)   . Other thalassemia   . First degree atrioventricular block   . Gout   . Osteoarthritis   . Basal cell carcinoma   . Anemia   . GERD (gastroesophageal reflux disease)   . Peptic ulcer with hemorrhage     duodenum   Past Surgical History  Procedure Date  . Tumor removed     from finger- left index  . Moles removed   . Ankle surgery     ORIF right '78  . Cataract extraction, bilateral     hx of  . Gastrectomy     hx of '56   Family History  Problem Relation Age of Onset  . Coronary artery disease  Father   . Heart attack Father   . Gout Father    History   Social History  . Marital Status: Married    Spouse Name: ANn    Number of Children: 8  . Years of Education: 18+   Occupational History  . executive     retired   Social History Main Topics  . Smoking status: Never Smoker   . Smokeless tobacco: Never Used  . Alcohol Use: No  . Drug Use: No  . Sexually Active: No   Other Topics Concern  . Not on file   Social History Narrative   [employer] [place]; [place]- [employer]. Married '59. Eight Daughters '60, '62, '66, '70, '73, '74, '78, '83; 19  grandchildren. Lives independently with wife. ACP- asked pt to think about these issues (DEc '12)    Current Outpatient Prescriptions on File Prior to Visit  Medication Sig Dispense Refill  . allopurinol (ZYLOPRIM) 300 MG tablet Take 1 tablet (300 mg total) by mouth daily.  30 tablet  6  . aspirin 81 MG tablet Take 81 mg by mouth daily.        . Cholecalciferol (VITAMIN D3) 2000 UNITS capsule Take 2,000 Units by mouth daily.        . Coenzyme Q-10 100 MG capsule Take 300 mg by mouth daily.       . colchicine 0.6 MG tablet Take 0.6 mg by mouth. Take 2 tablets then take 1 with in 2 hours if no better until have diarrhea      . Cyanocobalamin (VITAMIN B 12 PO) Take by mouth.        . fish oil-omega-3 fatty acids 1000 MG capsule Take 600 mg by mouth daily.       . folic acid (CVS FOLIC ACID) 400 MCG tablet Take 400 mcg by mouth daily.        Marland Kitchen omeprazole (PRILOSEC) 20 MG capsule Take 20 mg by mouth daily.        . saw palmetto 160 MG capsule Take 160 mg by mouth 2 (two) times daily.            Review of Systems System review is negative for any constitutional, cardiac, pulmonary, GI or neuro symptoms or complaints other than as described in the HPI.     Objective:   Physical Exam Filed Vitals:   12/31/11 1326  BP: 124/68  Pulse: 60  Temp: 97.3 F (36.3 C)  Resp: 16   Wt Readings from Last 3 Encounters:  12/31/11 273 lb (123.832 kg)  03/16/11 264 lb (119.75 kg)  02/25/11 263 lb (119.296 kg)   Gen'l - WNWD obese white man in no distress HEENT- [place]/AT, partial dentures, missing a few teeth, no buccal lesions, throat clear Neck - supple, no thyromegaly Cor- heart sounds distant but regular, 2_+ radial and 2+ DP Pulm - normal respirations Abdomen - obese, soft.  Genitalia deferred; prostate exam - he has aged out for screening Ext- no gross abnormality or deformity Neuro - A&O x 3, CN II - XII normal, motor strength - normal, normal gait and station.  Lab Results  Component  Value Date   WBC 6.7 01/19/2011   HGB 12.8* 01/19/2011   HCT 40.5 01/19/2011   PLT 193.0 01/19/2011   GLUCOSE 79 01/01/2012   CHOL 174 01/01/2012   TRIG 43.0 01/01/2012   HDL 51.10 01/01/2012   LDLDIRECT 145.0 08/29/2007   LDLCALC 114* 01/01/2012   ALT 17 01/01/2012  AST 18 01/01/2012   NA 139 01/01/2012   K 4.8 01/01/2012   CL 106 01/01/2012   CREATININE 1.0 01/01/2012   BUN 22 01/01/2012   CO2 28 01/01/2012   TSH 3.53 01/01/2012   HGBA1C 5.8 01/19/2011          Assessment & Plan:

## 2012-01-01 ENCOUNTER — Other Ambulatory Visit (INDEPENDENT_AMBULATORY_CARE_PROVIDER_SITE_OTHER): Payer: Medicare Other

## 2012-01-01 DIAGNOSIS — M1990 Unspecified osteoarthritis, unspecified site: Secondary | ICD-10-CM

## 2012-01-01 DIAGNOSIS — E785 Hyperlipidemia, unspecified: Secondary | ICD-10-CM

## 2012-01-01 DIAGNOSIS — M109 Gout, unspecified: Secondary | ICD-10-CM

## 2012-01-01 LAB — LIPID PANEL
HDL: 51.1 mg/dL (ref >39.00)
LDL Cholesterol: 114 mg/dL — ABNORMAL HIGH (ref 0–99)
Total CHOL/HDL Ratio: 3
Triglycerides: 43 mg/dL (ref 0.0–149.0)

## 2012-01-01 LAB — COMPREHENSIVE METABOLIC PANEL
Alkaline Phosphatase: 74 U/L (ref 39–117)
BUN: 22 mg/dL (ref 6–23)
Glucose, Bld: 79 mg/dL (ref 70–99)
Total Bilirubin: 1.1 mg/dL (ref 0.3–1.2)

## 2012-01-02 NOTE — Assessment & Plan Note (Signed)
Interval medical history is negative for major illness, surgery or injury. Physical exam is most notable for obesity. Previous and current lab results reviewed and are in normal limits. He is current with colorectal cancer screening. Immunizations are up to date.   In summary - a very interesting and pleasant man who seems medically stable but does need to loose weight.

## 2012-01-02 NOTE — Assessment & Plan Note (Signed)
Has had exacerbation of partial bladder outlet obstruction when using high dose antihistamines. He does take [employer].  Plan -  Avoid offending agents  For increase symptoms may need to consider additional pharmacotherapy.

## 2012-01-02 NOTE — Assessment & Plan Note (Signed)
On no medications! Last LDL 114 - better than goal for low risk patient of 130, but greater than goal for moderate risk (age, obesity, gender) patient of 100 or less.  Plan Life-style management: low fat diet, exercise and weight loss.

## 2012-01-02 NOTE — Assessment & Plan Note (Signed)
No report of any recent flares. Last uric acid level = 6.3 (normal) and he is tolerating allopurinol with adverse affects.  Plan  Continue low purine diet  Continue allopurinol.

## 2012-01-02 NOTE — Assessment & Plan Note (Signed)
Vitals - 1 value per visit 12/31/2011 03/16/2011 02/25/2011 02/16/2011  BMI 42.75 41.34 41.18 41.81   Vitals - 1 value per visit 02/13/2011 01/22/2011 01/19/2011  BMI 36.95 41.31 40.87   He is aware of the health risks associated with obesity. Normal BMI = 19-25. Despite a love for food and eating he needs to make a conscientious effort to loose weight: smart food choices, PORTION SIZE CONTROL, regular aerobic exercise. Goal is to loos 1 lb per month.

## 2012-01-11 ENCOUNTER — Ambulatory Visit (INDEPENDENT_AMBULATORY_CARE_PROVIDER_SITE_OTHER)
Admission: RE | Admit: 2012-01-11 | Discharge: 2012-01-11 | Disposition: A | Discharge status: Home / Self Care | Payer: 59 | Source: Ambulatory Visit | Attending: Internal Medicine | Admitting: Internal Medicine

## 2012-01-11 ENCOUNTER — Ambulatory Visit (INDEPENDENT_AMBULATORY_CARE_PROVIDER_SITE_OTHER): Payer: 59 | Admitting: Internal Medicine

## 2012-01-11 ENCOUNTER — Encounter: Payer: Self-pay | Admitting: Internal Medicine

## 2012-01-11 VITALS — BP 128/70 | HR 63 | Temp 97.8°F | Resp 16 | Wt 279.0 lb

## 2012-01-11 DIAGNOSIS — R52 Pain, unspecified: Secondary | ICD-10-CM

## 2012-01-11 DIAGNOSIS — R109 Unspecified abdominal pain: Secondary | ICD-10-CM

## 2012-01-11 NOTE — Progress Notes (Signed)
  Subjective:    Patient ID: Jacob [name], male    DOB: 1933/05/09, 78 y.o.   MRN: [id number]  HPI Jacob [name] presents with the on-set of pain last Thursday PM which he attributed to exercise. Friday he saw blood spots on the underwear. He assumed he has passed a stone. The pain has continued. He has had a slight sense of running a fever. He is concerned he may have diverticulitis, which he has had in the past.   PMH, FamHx and SocHx reviewed for any changes and relevance.  Current Outpatient Prescriptions on File Prior to Visit  Medication Sig Dispense Refill  . allopurinol (ZYLOPRIM) 300 MG tablet Take 1 tablet (300 mg total) by mouth daily.  30 tablet  6  . aspirin 81 MG tablet Take 81 mg by mouth daily.        . Cholecalciferol (VITAMIN D3) 2000 UNITS capsule Take 2,000 Units by mouth daily.        . Coenzyme Q-10 100 MG capsule Take 300 mg by mouth daily.       . colchicine 0.6 MG tablet Take 0.6 mg by mouth. Take 2 tablets then take 1 with in 2 hours if no better ,may repeat the following day      . Cyanocobalamin (VITAMIN B 12 PO) Take by mouth.        . diphenhydramine-acetaminophen (TYLENOL PM) 25-500 MG TABS Take 2 tablets by mouth at bedtime as needed.  14 tablet    . fish oil-omega-3 fatty acids 1000 MG capsule Take 600 mg by mouth daily.       . folic acid (CVS FOLIC ACID) 400 MCG tablet Take 400 mcg by mouth daily.        Marland Kitchen loratadine (CLARITIN) 10 MG tablet Take 10 mg by mouth daily.      Marland Kitchen omeprazole (PRILOSEC) 20 MG capsule Take 20 mg by mouth daily.        . saw palmetto 160 MG capsule Take 160 mg by mouth 2 (two) times daily.            Review of Systems System review is negative for any constitutional, cardiac, pulmonary, GI or neuro symptoms or complaints other than as described in the HPI.     Objective:   Physical Exam Filed Vitals:   01/11/12 1117  BP: 128/70  Pulse: 63  Temp: 97.8 F (36.6 C)  Resp: 16   gen'l- ovefrweight white man in no  acute distress COr - RRR PUlm - normal respirations Abdomen - no guarding or rebound. No tenderness in deep palpation in the lower quadrants. He does have tenderness to palpation at the flank.        Assessment & Plan:  Flank pain with mild gross hematuria and h/o kidney stones  Plan - CT kidney stone protocol. 2:30 today.

## 2012-01-13 ENCOUNTER — Telehealth: Payer: Self-pay | Admitting: *Deleted

## 2012-01-13 ENCOUNTER — Other Ambulatory Visit: Payer: Self-pay | Admitting: Internal Medicine

## 2012-01-13 ENCOUNTER — Encounter: Payer: Self-pay | Admitting: *Deleted

## 2012-01-13 MED ORDER — AMOXICILLIN-POT CLAVULANATE 875-125 MG PO TABS: 1.0000 | ORAL_TABLET | Freq: Two times a day (BID) | ORAL | Status: DC

## 2012-01-13 NOTE — Telephone Encounter (Signed)
Report of his annual visit mailed 10/21 that included his lab results. If he hasn't received can remail to hiim or you can print his labs and mail that. No special diet for diverticulitis

## 2012-01-13 NOTE — Telephone Encounter (Signed)
PATIENT NOTIFIED OF CT RESULTS. NOTIFIED OF MEDICATIONS CALLED IN TO HIS PHARMACY. PATIENT UNDERSTAND RESULTS AND MEDICATIONS. PATIENT HAS QUESTION OF DIET WITH DIVERTICULITIS. REQUEST THAT DR. Debby Bud CALL HIM CONCERNING THIS. ALSO HAS QUESTION CONCERNING LAB RESULTS THAT HE CAN NOT VIEW ON MY CHART. CONCERNS THAT INFORMATION ON HIS PROFILE OF AVS IS WRONG. HAS HAD BLOOD TRANSFUSION IN 03/1954 AND 11/1953. PATIENT STATES THAT HE DID RECEIVE TDAP VACCINE LAST YEAR. VACCINE AND SMOKING INFORMATION CORRECTED PER SUE.

## 2012-01-22 ENCOUNTER — Encounter: Payer: Self-pay | Admitting: Internal Medicine

## 2012-01-27 ENCOUNTER — Other Ambulatory Visit: Payer: Self-pay | Admitting: *Deleted

## 2012-01-27 MED ORDER — ALLOPURINOL 300 MG PO TABS: 300.0000 mg | ORAL_TABLET | Freq: Every day | ORAL | Status: DC

## 2012-02-03 ENCOUNTER — Encounter: Payer: Self-pay | Admitting: Cardiology

## 2012-02-03 ENCOUNTER — Ambulatory Visit (INDEPENDENT_AMBULATORY_CARE_PROVIDER_SITE_OTHER): Payer: 59 | Admitting: Cardiology

## 2012-02-03 VITALS — BP 144/76 | HR 70 | Ht 67.0 in | Wt 279.0 lb

## 2012-02-03 DIAGNOSIS — R002 Palpitations: Secondary | ICD-10-CM

## 2012-02-03 NOTE — Progress Notes (Signed)
HPI The patient presents for followup of palpitations. He has unfortunately been hampered by back discomfort.  He has not been able to exercise as much as he had previously. However, he still denies any palpitations, presyncope or syncope. He has had no chest pressure, neck or arm discomfort. He has had no shortness of breath, PND or orthopnea. He has had no weight gain or edema.   Allergies  Allergen Reactions  . Prednisone     REACTION: GI bleed  . Sulfonamide Derivatives     REACTION: Rash    Current Outpatient Prescriptions  Medication Sig Dispense Refill  . allopurinol (ZYLOPRIM) 300 MG tablet Take 1 tablet (300 mg total) by mouth daily.  30 tablet  6  . aspirin 81 MG tablet Take 81 mg by mouth daily.        . Cholecalciferol (VITAMIN D3) 2000 UNITS capsule Take 2,000 Units by mouth daily.       . Coenzyme Q-10 100 MG capsule Take 300 mg by mouth daily.       . colchicine 0.6 MG tablet Take  2 tab , then take 1 tab within 2hrs and repeat the next day if pain persist      . Cyanocobalamin (VITAMIN B 12 PO) Take by mouth.        . diphenhydramine-acetaminophen (TYLENOL PM) 25-500 MG TABS Take 2 tablets by mouth at bedtime as needed.  14 tablet    . folic acid (CVS FOLIC ACID) 400 MCG tablet Take 400 mcg by mouth daily.        Marland Kitchen loratadine (CLARITIN) 10 MG tablet Take 10 mg by mouth daily.      . Omega-3 Fatty Acids (FISH OIL) 600 MG CAPS Take by mouth.      Marland Kitchen omeprazole (PRILOSEC) 20 MG capsule Take 20 mg by mouth daily.        . saw palmetto 160 MG capsule 160 mg. Take 2 tabs  daily        Past Medical History  Diagnosis Date  . Benign prostatic hypertrophy     hx of  . Other and unspecified hyperlipidemia   . Personal history of unspecified disorders of nervous system and sense organs   . Mitral valve disorders   . Unspecified sleep apnea   . Morbid obesity   . Personal history of urinary calculi   . Diverticulosis of colon (without mention of hemorrhage)   . Other  thalassemia   . First degree atrioventricular block   . Gout   . Osteoarthritis   . Basal cell carcinoma   . Anemia   . GERD (gastroesophageal reflux disease)   . Peptic ulcer with hemorrhage     duodenum    Past Surgical History  Procedure Date  . Tumor removed     from finger- left index  . Moles removed   . Ankle surgery     ORIF right '78  . Cataract extraction, bilateral     hx of  . Gastrectomy     hx of '56. Transfused 7 pints blood '55, 4 pints '56    ROS:  As stated in the HPI and negative for all other systems.  PHYSICAL EXAM BP 144/76  Pulse 70  Ht 5\' 7"  (1.702 m)  Wt 279 lb (126.554 kg)  BMI 43.70 kg/m2  SpO2 98% GENERAL:  Well appearing HEENT:  Pupils equal round and reactive, fundi not visualized, oral mucosa unremarkable NECK:  No jugular venous distention, waveform within  normal limits, carotid upstroke brisk and symmetric, no bruits, no thyromegaly LYMPHATICS:  No cervical, inguinal adenopathy LUNGS:  Clear to auscultation bilaterally BACK:  No CVA tenderness CHEST:  Unremarkable HEART:  PMI not displaced or sustained,S1 and S2 within normal limits, no S3, no S4, no clicks, no rubs, no murmurs ABD:  Flat, positive bowel sounds normal in frequency in pitch, no bruits, no rebound, no guarding, no midline pulsatile mass, no hepatomegaly, no splenomegaly, obese EXT:  2 plus pulses throughout, no edema, no cyanosis no clubbing SKIN:  No rashes no nodules NEURO:  Cranial nerves II through XII grossly intact, motor grossly intact throughout PSYCH:  Cognitively intact, oriented to person place and time   EKG:  Normal sinus rhythm, rate 70, left axis deviation, marked first degree AV block, poor anterior R wave progression, no change from previous. 02/03/2012   ASSESSMENT AND PLAN   MORBID OBESITY, HX OF -  He understands the need to lose weight and will try to find an exercise that does not irritate his back.  BLOCK, AV, 1ST DEGREE -  He continues to  have no symptoms related to this.  We again discussed the symptoms that could occur should he have progressive heart block. However, no further cardiac workup or therapy such as pacemaker is indicated at this time.

## 2012-02-03 NOTE — Patient Instructions (Addendum)
The current medical regimen is effective;  continue present plan and medications.  Follow up in 1 year with Dr Hochrein.  You will receive a letter in the mail 2 months before you are due.  Please call us when you receive this letter to schedule your follow up appointment.  

## 2012-03-24 ENCOUNTER — Other Ambulatory Visit: Payer: Self-pay | Admitting: *Deleted

## 2012-03-24 MED ORDER — ALLOPURINOL 300 MG PO TABS: 300.0000 mg | ORAL_TABLET | Freq: Every day | ORAL | Status: DC

## 2012-03-31 ENCOUNTER — Telehealth: Payer: Self-pay | Admitting: Internal Medicine

## 2012-03-31 ENCOUNTER — Encounter: Payer: Self-pay | Admitting: Internal Medicine

## 2012-03-31 ENCOUNTER — Ambulatory Visit (INDEPENDENT_AMBULATORY_CARE_PROVIDER_SITE_OTHER): Payer: 59 | Admitting: Internal Medicine

## 2012-03-31 VITALS — BP 138/68 | HR 65 | Temp 97.6°F | Resp 10 | Wt 285.0 lb

## 2012-03-31 DIAGNOSIS — L03019 Cellulitis of unspecified finger: Secondary | ICD-10-CM

## 2012-03-31 MED ORDER — CEPHALEXIN 500 MG PO CAPS: 500.0000 mg | ORAL_CAPSULE | Freq: Four times a day (QID) | ORAL | Status: DC

## 2012-03-31 NOTE — Patient Instructions (Addendum)
Paronychia -   Plan - Soak in very warm water 3 times a day  Ibuprofen for pain.  Keflex 500 mg 4 times a day x 7   Paronychia Paronychia is an inflammatory reaction involving the folds of the skin surrounding the fingernail. This is commonly caused by an infection in the skin around a nail. The most common cause of paronychia is frequent wetting of the hands (as seen with bartenders, food servers, nurses or others who wet their hands). This makes the skin around the fingernail susceptible to infection by bacteria (germs) or fungus. Other predisposing factors are:  Aggressive manicuring.   Nail biting.   Thumb sucking.  The most common cause is a staphylococcal (a type of germ) infection, or a fungal (Candida) infection. When caused by a germ, it usually comes on suddenly with redness, swelling, pus and is often painful. It may get under the nail and form an abscess (collection of pus), or form an abscess around the nail. If the nail itself is infected with a fungus, the treatment is usually prolonged and may require oral medicine for up to one year. Your caregiver will determine the length of time treatment is required. The paronychia caused by bacteria (germs) may largely be avoided by not pulling on hangnails or picking at cuticles. When the infection occurs at the tips of the finger it is called felon. When the cause of paronychia is from the herpes simplex virus (HSV) it is called herpetic whitlow. TREATMENT   When an abscess is present treatment is often incision and drainage. This means that the abscess must be cut open so the pus can get out. When this is done, the following home care instructions should be followed. HOME CARE INSTRUCTIONS    It is important to keep the affected fingers very dry. Rubber or plastic gloves over cotton gloves should be used whenever the hand must be placed in water.   Keep wound clean, dry and dressed as suggested by your caregiver between warm soaks or  warm compresses.   Soak in warm water for fifteen to twenty minutes three to four times per day for bacterial infections. Fungal infections are very difficult to treat, so often require treatment for long periods of time.   For bacterial (germ) infections take antibiotics (medicine which kill germs) as directed and finish the prescription, even if the problem appears to be solved before the medicine is gone.   Only take over-the-counter or prescription medicines for pain, discomfort, or fever as directed by your caregiver.  SEEK IMMEDIATE MEDICAL CARE IF:  You have redness, swelling, or increasing pain in the wound.   You notice pus coming from the wound.   You have a fever.   You notice a bad smell coming from the wound or dressing.  Document Released: 08/26/2000 Document Revised: 05/25/2011 Document Reviewed: 04/27/2008 [employer] Patient Information 2013 [place], [place].

## 2012-03-31 NOTE — Progress Notes (Signed)
  Subjective:    Patient ID: Jacob [name], male    DOB: [birth year], 79 y.o.   MRN: [id number]  HPI Jacob [name] presents for a very sore distal 4th digit over the last '36 hrs with redness and swelling. He gets some relief from ibuprofen.  PMH, FamHx and SocHx reviewed for any changes and relevance. Current Outpatient Prescriptions on File Prior to Visit  Medication Sig Dispense Refill  . allopurinol (ZYLOPRIM) 300 MG tablet Take 1 tablet (300 mg total) by mouth daily.  30 tablet  6  . aspirin 81 MG tablet Take 81 mg by mouth daily.        . Cholecalciferol (VITAMIN D3) 2000 UNITS capsule Take 2,000 Units by mouth daily. Take 4,000 IU daily      . Coenzyme Q-10 100 MG capsule Take 300 mg by mouth daily.       . colchicine 0.6 MG tablet Take  2 tab , then take 1 tab within 2hrs and repeat the next day if pain persist      . Cyanocobalamin (VITAMIN B 12 PO) Take by mouth.        . diphenhydramine-acetaminophen (TYLENOL PM) 25-500 MG TABS Take 2 tablets by mouth at bedtime as needed.  14 tablet    . folic acid (CVS FOLIC ACID) 400 MCG tablet Take 400 mcg by mouth daily.        Marland Kitchen loratadine (CLARITIN) 10 MG tablet Take 10 mg by mouth daily.      . Omega-3 Fatty Acids (FISH OIL) 600 MG CAPS Take by mouth.      Marland Kitchen omeprazole (PRILOSEC) 20 MG capsule Take 20 mg by mouth daily.        . saw palmetto 160 MG capsule 160 mg. Take 2 tabs  daily          Review of Systems System review is negative for any constitutional, cardiac, pulmonary, GI or neuro symptoms or complaints other than as described in the HPI.     Objective:   Physical Exam Filed Vitals:   03/31/12 1610  BP: 138/68  Pulse: 65  Temp: 97.6 F (36.4 C)  Resp: 10   Gen'l- overweight white man Derm - distal aspect right 4th digit red, warm very tender with early pointing.       Assessment & Plan:  Paronychia -   Plan - Soak in very warm water 3 times a day  Ibuprofen for pain.  Keflex 500 mg 4 times a day x 7

## 2012-03-31 NOTE — Telephone Encounter (Signed)
The pt is hoping to be seen for an infected finger.  Do you want him worked in?

## 2012-03-31 NOTE — Telephone Encounter (Signed)
yes

## 2012-04-04 ENCOUNTER — Ambulatory Visit (INDEPENDENT_AMBULATORY_CARE_PROVIDER_SITE_OTHER): Payer: 59 | Admitting: Internal Medicine

## 2012-04-04 ENCOUNTER — Ambulatory Visit: Payer: 59 | Admitting: Internal Medicine

## 2012-04-04 ENCOUNTER — Telehealth: Payer: Self-pay | Admitting: Internal Medicine

## 2012-04-04 ENCOUNTER — Encounter: Payer: Self-pay | Admitting: Internal Medicine

## 2012-04-04 VITALS — BP 132/68 | HR 68 | Temp 97.1°F | Resp 10 | Wt 283.0 lb

## 2012-04-04 DIAGNOSIS — IMO0001 Reserved for inherently not codable concepts without codable children: Secondary | ICD-10-CM

## 2012-04-04 DIAGNOSIS — L03019 Cellulitis of unspecified finger: Secondary | ICD-10-CM

## 2012-04-04 NOTE — Telephone Encounter (Signed)
Scheduled for this AM

## 2012-04-04 NOTE — Telephone Encounter (Signed)
The patient is hoping to come in today to get his finger lanced.  Do you want him worked in?

## 2012-04-04 NOTE — Progress Notes (Signed)
  Subjective:    Patient ID: Jacob [name], male    DOB: [birth year], 79 y.o.   MRN: [id number]  HPI Seen 1/16 for paronychia. He has been soaking the finger and he has been taking keflex. He called for acute appointment for I&D. On the way over he had spontaneous drainage from the paronychia.  PMH, FamHx and SocHx reviewed for any changes and relevance. Current Outpatient Prescriptions on File Prior to Visit  Medication Sig Dispense Refill  . allopurinol (ZYLOPRIM) 300 MG tablet Take 1 tablet (300 mg total) by mouth daily.  30 tablet  6  . aspirin 81 MG tablet Take 81 mg by mouth daily.        . cephALEXin (KEFLEX) 500 MG capsule Take 1 capsule (500 mg total) by mouth 4 (four) times daily.  28 capsule  0  . Cholecalciferol (VITAMIN D3) 2000 UNITS capsule Take 2,000 Units by mouth daily. Take 4,000 IU daily      . Coenzyme Q-10 100 MG capsule Take 300 mg by mouth daily.       . colchicine 0.6 MG tablet Take  2 tab , then take 1 tab within 2hrs and repeat the next day if pain persist      . Cyanocobalamin (VITAMIN B 12 PO) Take by mouth.        . diphenhydramine-acetaminophen (TYLENOL PM) 25-500 MG TABS Take 2 tablets by mouth at bedtime as needed.  14 tablet    . folic acid ([employer] FOLIC ACID) 400 MCG tablet Take 400 mcg by mouth daily.        Marland Kitchen loratadine (CLARITIN) 10 MG tablet Take 10 mg by mouth daily.      . Omega-3 Fatty Acids (FISH OIL) 600 MG CAPS Take by mouth.      Marland Kitchen omeprazole (PRILOSEC) 20 MG capsule Take 20 mg by mouth daily.        . saw palmetto 160 MG capsule 160 mg. Take 2 tabs  daily          Review of Systems System review is negative for any constitutional, cardiac, pulmonary, GI or neuro symptoms or complaints other than as described in the HPI.     Objective:   Physical Exam Filed Vitals:   04/04/12 1050  BP: 132/68  Pulse: 68  Temp: 97.1 F (36.2 C)  Resp: 10   Gen'l- overweight white man MSK - 4th digit left hand erythematous with spontaneous drainage  of purulent material. Decreased tenderness compared to 1/16.       Assessment & Plan:  Paronychia - spontaneous drainage. The wound was manipulated to express any residual pus.  Plan Continue keflex  Soak 15 min tid

## 2012-04-08 ENCOUNTER — Telehealth: Payer: Self-pay | Admitting: *Deleted

## 2012-04-08 MED ORDER — CEPHALEXIN 500 MG PO CAPS: 500.0000 mg | ORAL_CAPSULE | Freq: Four times a day (QID) | ORAL | Status: DC

## 2012-04-08 NOTE — Telephone Encounter (Signed)
Pt called stating that his finger is still infected. He is still soaking it and getting pus out of it. He believes he needs a few more days of the antibiotic. Please advise.

## 2012-04-08 NOTE — Telephone Encounter (Signed)
Local care is most important: daily soaks, minimize manipulation (squeezing out pus).  OK to renew keflex 500 mg qid x 7 days #28

## 2012-04-08 NOTE — Telephone Encounter (Signed)
Called pt and local care is most important: daily soaks, minimize manipulation (squeezing out pus).  OK to renew keflex 500 mg qid x 7 days #28

## 2012-09-18 ENCOUNTER — Other Ambulatory Visit: Payer: Self-pay | Admitting: Internal Medicine

## 2012-10-21 ENCOUNTER — Other Ambulatory Visit: Payer: Self-pay

## 2012-10-21 MED ORDER — ALLOPURINOL 300 MG PO TABS: 300.0000 mg | ORAL_TABLET | Freq: Every day | ORAL | Status: DC

## 2012-11-02 ENCOUNTER — Ambulatory Visit (INDEPENDENT_AMBULATORY_CARE_PROVIDER_SITE_OTHER): Payer: 59 | Admitting: Internal Medicine

## 2012-11-02 ENCOUNTER — Encounter: Payer: Self-pay | Admitting: Internal Medicine

## 2012-11-02 VITALS — BP 136/58 | HR 69 | Temp 98.5°F | Ht 67.0 in | Wt 270.0 lb

## 2012-11-02 DIAGNOSIS — M1990 Unspecified osteoarthritis, unspecified site: Secondary | ICD-10-CM

## 2012-11-02 DIAGNOSIS — M109 Gout, unspecified: Secondary | ICD-10-CM

## 2012-11-02 DIAGNOSIS — Z87898 Personal history of other specified conditions: Secondary | ICD-10-CM

## 2012-11-02 DIAGNOSIS — Z0000 Encounter for general adult medical examination without abnormal findings: Secondary | ICD-10-CM

## 2012-11-02 DIAGNOSIS — E785 Hyperlipidemia, unspecified: Secondary | ICD-10-CM

## 2012-11-02 DIAGNOSIS — N400 Enlarged prostate without lower urinary tract symptoms: Secondary | ICD-10-CM

## 2012-11-02 DIAGNOSIS — G4730 Sleep apnea, unspecified: Secondary | ICD-10-CM

## 2012-11-02 DIAGNOSIS — G609 Hereditary and idiopathic neuropathy, unspecified: Secondary | ICD-10-CM

## 2012-11-02 DIAGNOSIS — K219 Gastro-esophageal reflux disease without esophagitis: Secondary | ICD-10-CM

## 2012-11-02 DIAGNOSIS — I10 Essential (primary) hypertension: Secondary | ICD-10-CM

## 2012-11-02 MED ORDER — VITAMIN D3 50 MCG (2000 UT) PO CAPS: 2000.0000 [IU] | ORAL_CAPSULE | Freq: Every day | ORAL | Status: DC

## 2012-11-02 NOTE — Patient Instructions (Addendum)
Thanks for coming in.  Normal exam except for weight, decreased deep vibratory sensation distal lower extremity, callus on the instep of the left foot, varicosities thigh, black head on the chest.  Lab - order for today and results will be on MyChart  You are current with healthy maintenance: immunizations and colorectal cancer screening with recall for 2015.   Recommend you resume your exercise program, continue on a healthy calorie restricted diet.   Come  Back as needed or in 1 year.

## 2012-11-02 NOTE — Progress Notes (Signed)
Subjective:    Patient ID: Jacob [name], male    DOB: [birth year], 79 y.o.   MRN: [id number]  HPI The patient is here for annual Medicare wellness examination and management of other chronic and acute problems.  He has a comedone on the sternum. He has a callus 2 mm on the instep left foot. He has oncyhyomycosis, varicosities lower extremity. Reviewed Vit D supplement - unlikely to be D deficient.    The risk factors are reflected in the social history.  The roster of all physicians providing medical care to patient - is listed in the Snapshot section of the chart.  Activities of daily living:  The patient is 100% inedpendent in all ADLs: dressing, toileting, feeding as well as independent mobility  Home safety : The patient has smoke detectors in the home. Falls - none. Home is fall safe. They wear seatbelts. No firearms at home. There is no violence in the home.   There is no risks for hepatitis, STDs or HIV. There is a history of blood transfusion - '55 bleeding. They have no travel history to infectious disease endemic areas of the world.  The patient has seen their dentist in the last six month. They have seen their eye doctor in the last year. They deny any hearing difficulty and have not had audiologic testing in the last year.    They do not  have excessive sun exposure. Discussed the need for sun protection: hats, long sleeves and use of sunscreen if there is significant sun exposure.   Diet: the importance of a healthy diet is discussed. They do have a healthy diet.  The patient has a regular exercise program: 4 miles recumbent bike, 2,500 meters row; 30 min duration, 2-3 times week. Also mows 40 min daily in season.  The benefits of regular aerobic exercise were discussed.  Depression screen: there are no signs or vegative symptoms of depression- irritability, change in appetite, anhedonia, sadness/tearfullness.  Cognitive assessment: the patient manages all their  financial and personal affairs and is actively engaged.   The following portions of the patient's history were reviewed and updated as appropriate: allergies, current medications, past family history, past medical history,  past surgical history, past social history  and problem list.  Vision, hearing, body mass index were assessed and reviewed.   During the course of the visit the patient was educated and counseled about appropriate screening and preventive services including : fall prevention , diabetes screening, nutrition counseling, colorectal cancer screening, and recommended immunizations.  Past Medical History  Diagnosis Date  . Benign prostatic hypertrophy     hx of  . Other and unspecified hyperlipidemia   . Personal history of unspecified disorders of nervous system and sense organs   . Mitral valve disorders   . Unspecified sleep apnea   . Morbid obesity   . Personal history of urinary calculi   . Diverticulosis of colon (without mention of hemorrhage)   . Other thalassemia   . First degree atrioventricular block   . Gout   . Osteoarthritis   . Basal cell carcinoma   . Anemia   . GERD (gastroesophageal reflux disease)   . Peptic ulcer with hemorrhage     duodenum   Past Surgical History  Procedure Laterality Date  . Tumor removed      from finger- left index  . Moles removed    . Ankle surgery      ORIF right '78  . Cataract extraction, bilateral  hx of  . Gastrectomy      hx of '56. Transfused 7 pints blood '55, 4 pints '56   Family History  Problem Relation Age of Onset  . Coronary artery disease Father   . Heart attack Father   . Gout Father   . Cancer Daughter     skin   History   Social History  . Marital Status: Married    Spouse Name: ANn    Number of Children: 8  . Years of Education: 18+   Occupational History  . executive     retired   Social History Main Topics  . Smoking status: Former Smoker    Types: Cigarettes    Start  date: 01/21/1970  . Smokeless tobacco: Never Used  . Alcohol Use: No  . Drug Use: No  . Sexual Activity: Yes    Partners: Female   Other Topics Concern  . Not on file   Social History Narrative   [employer] of [place]; [place]. Married '59. Eight Daughters '60, '62, '66, '70, '73, '74, '78, '83; 19 grandchildren. Lives independently with wife. ACP- asked pt to think about these issues (DEc '12)          Current Outpatient Prescriptions on File Prior to Visit  Medication Sig Dispense Refill  . allopurinol (ZYLOPRIM) 300 MG tablet Take 1 tablet (300 mg total) by mouth daily.  90 tablet  3  . aspirin 81 MG tablet Take 81 mg by mouth daily.        . Cholecalciferol (VITAMIN D3) 2000 UNITS capsule Take 2,000 Units by mouth daily. Take 4,000 IU daily      . Coenzyme Q-10 100 MG capsule Take 300 mg by mouth daily.       . colchicine 0.6 MG tablet Take  2 tab , then take 1 tab within 2hrs and repeat the next day if pain persist      . Cyanocobalamin (VITAMIN B 12 PO) Take by mouth.        . diphenhydramine-acetaminophen (TYLENOL PM) 25-500 MG TABS Take 2 tablets by mouth at bedtime as needed.  14 tablet    . folic acid ([employer]S FOLIC ACID) 400 MCG tablet Take 600 mcg by mouth daily.       Marland Kitchen loratadine (CLARITIN) 10 MG tablet Take 10 mg by mouth daily.      . Omega-3 Fatty Acids (FISH OIL) 600 MG CAPS Take by mouth.      Marland Kitchen omeprazole (PRILOSEC) 20 MG capsule Take 20 mg by mouth daily.        . saw palmetto 160 MG capsule 160 mg. Take 2 tabs  daily       No current facility-administered medications on file prior to visit.       Review of Systems Constitutional:  Negative for fever, chills, activity change and unexpected weight change.  HEENT:  Negative for hearing loss, ear pain, congestion, neck stiffness and postnasal drip. Negative for sore throat or swallowing problems. Negative for dental complaints.   Eyes: Negative for vision loss or change in visual acuity.   Respiratory: Negative for chest tightness and wheezing. Negative for DOE.   Cardiovascular: Negative for chest pain or palpitations. No decreased exercise tolerance Gastrointestinal: No change in bowel habit. No bloating or gas. No reflux or indigestion Genitourinary: Negative for urgency, frequency, flank pain and difficulty urinating.  Musculoskeletal: Negative for myalgias, back pain, arthralgias and gait problem.  Neurological: Negative for dizziness, tremors, weakness  and headaches.  Hematological: Negative for adenopathy.  Psychiatric/Behavioral: Negative for behavioral problems and dysphoric mood.       Objective:   Physical Exam Filed Vitals:   11/02/12 1435  BP: 136/58  Pulse: 69  Temp: 98.5 F (36.9 C)   Wt Readings from Last 3 Encounters:  11/02/12 270 lb (122.471 kg)  04/04/12 283 lb (128.368 kg)  03/31/12 285 lb 0.6 oz (129.293 kg)  Body mass index is 42.28 kg/(m^2).  Gen'l: Well nourished well developed, obese white male in no acute distress  HEENT: Head: Normocephalic and atraumatic. Right Ear: External ear normal. EAC/TM nl. Left Ear: External ear normal.  EAC/TM nl. Nose: Nose normal. Mouth/Throat: Oropharynx is clear and moist. Dentition - native, in good repair. No buccal or palatal lesions. Posterior pharynx clear. Eyes: Conjunctivae and sclera clear. EOM intact. Pupils are equal, round, and reactive to light. Right eye exhibits no discharge. Left eye exhibits no discharge. Neck: Normal range of motion. Neck supple. No JVD present. No tracheal deviation present. No thyromegaly present.  Cardiovascular: Normal rate, regular rhythm, no gallop, no friction rub, no murmur heard.      Quiet precordium. 2+ radial and DP pulses . No carotid bruits Pulmonary/Chest: Effort normal. No respiratory distress or increased WOB, no wheezes, no rales. No chest wall deformity or CVAT. Abdomen: Soft. Obese.Bowel sounds are normal in all quadrants. He exhibits no distension, no  tenderness, no rebound or guarding, No heptosplenomegaly  Genitourinary: deferred   Musculoskeletal: Normal range of motion. He exhibits no edema and no tenderness.       Small and large joints without redness, synovial thickening or deformity. Full range of motion preserved about all small, median and large joints.  Lymphadenopathy:    He has no cervical or supraclavicular adenopathy.  Neurological: He is alert and oriented to person, place, and time. CN II-XII intact. DTRs 2+ and symmetrical biceps, radial and patellar tendons. Cerebellar function normal with no tremor, rigidity, normal gait and station.  Skin: Skin is warm and dry. No rash noted. No erythema.  Psychiatric: He has a normal mood and affect. His behavior is normal. Thought content normal.   Recent Results (from the past 2160 hour(s))  URIC ACID     Status: None   Collection Time    11/03/12  7:35 AM      Result Value Range   Uric Acid, Serum 5.7  4.0 - 7.8 mg/dL  COMPREHENSIVE METABOLIC PANEL     Status: None   Collection Time    11/03/12  7:35 AM      Result Value Range   Sodium 138  135 - 145 mEq/L   Potassium 4.5  3.5 - 5.1 mEq/L   Chloride 104  96 - 112 mEq/L   CO2 27  19 - 32 mEq/L   Glucose, Bld 82  70 - 99 mg/dL   BUN 22  6 - 23 mg/dL   Creatinine, Ser 1.0  0.4 - 1.5 mg/dL   Total Bilirubin 0.9  0.3 - 1.2 mg/dL   Alkaline Phosphatase 72  39 - 117 U/L   AST 17  0 - 37 U/L   ALT 17  0 - 53 U/L   Total Protein 7.1  6.0 - 8.3 g/dL   Albumin 3.9  3.5 - 5.2 g/dL   Calcium 9.5  8.4 - 40.9 mg/dL   GFR 81.19  >14.78 mL/min  LIPID PANEL     Status: Abnormal   Collection Time  11/03/12  7:35 AM      Result Value Range   Cholesterol 166  0 - 200 mg/dL   Comment: ATP III Classification       Desirable:  < 200 mg/dL               Borderline High:  200 - 239 mg/dL          High:  > = 161 mg/dL   Triglycerides 09.6  0.0 - 149.0 mg/dL   Comment: Normal:  <045 mg/dLBorderline High:  150 - 199 mg/dL   HDL 40.98   >11.91 mg/dL   VLDL 9.0  0.0 - 47.8 mg/dL   LDL Cholesterol 295 (*) 0 - 99 mg/dL   Total CHOL/HDL Ratio 3     Comment:                Men          Women1/2 Average Risk     3.4          3.3Average Risk          5.0          4.42X Average Risk          9.6          7.13X Average Risk          15.0          11.0                      VITAMIN B12     Status: None   Collection Time    11/03/12  7:35 AM      Result Value Range   Vitamin B-12 664  211 - 911 pg/mL          Assessment & Plan:

## 2012-11-03 ENCOUNTER — Other Ambulatory Visit (INDEPENDENT_AMBULATORY_CARE_PROVIDER_SITE_OTHER): Payer: 59

## 2012-11-03 DIAGNOSIS — G609 Hereditary and idiopathic neuropathy, unspecified: Secondary | ICD-10-CM

## 2012-11-03 DIAGNOSIS — E785 Hyperlipidemia, unspecified: Secondary | ICD-10-CM

## 2012-11-03 DIAGNOSIS — M109 Gout, unspecified: Secondary | ICD-10-CM

## 2012-11-03 DIAGNOSIS — I10 Essential (primary) hypertension: Secondary | ICD-10-CM

## 2012-11-03 LAB — VITAMIN B12: Vitamin B-12: 664 pg/mL (ref 211–911)

## 2012-11-03 LAB — COMPREHENSIVE METABOLIC PANEL
ALT: 17 U/L (ref 0–53)
CO2: 27 mEq/L (ref 19–32)
Calcium: 9.5 mg/dL (ref 8.4–10.5)
Chloride: 104 mEq/L (ref 96–112)
GFR: 74.38 mL/min (ref >60.00)
Potassium: 4.5 mEq/L (ref 3.5–5.1)
Sodium: 138 mEq/L (ref 135–145)
Total Protein: 7.1 g/dL (ref 6.0–8.3)

## 2012-11-03 LAB — LIPID PANEL: VLDL: 9 mg/dL (ref 0.0–40.0)

## 2012-11-03 LAB — URIC ACID: Uric Acid, Serum: 5.7 mg/dL (ref 4.0–7.8)

## 2012-11-05 NOTE — Assessment & Plan Note (Signed)
Body mass index is 42.28 kg/(m^2). No progress with weight management - BMI unchanged. He is aware of the importance of reducing his weight.   Plan -  Diet management: smart food choices, PORTION SIZE CONTROL, regular exercise. Goal - to loose 1-2 lbs.month. Target weight - 220 lb

## 2012-11-05 NOTE — Assessment & Plan Note (Signed)
Not c/o signs or symptoms of prostatism.

## 2012-11-05 NOTE — Assessment & Plan Note (Signed)
No major joint deformity. He is not limited in his activities.  Plan For AM GEL heat.  Flex-stretch exercise daily.

## 2012-11-05 NOTE — Assessment & Plan Note (Signed)
No recent flares of gout. Uric acid level is in normal range. Takes and tolerated allopurinol 300 mg daily.  Plan Continue Allopurinol

## 2012-11-05 NOTE — Assessment & Plan Note (Signed)
BP Readings from Last 3 Encounters:  11/02/12 136/58  04/04/12 132/68  03/31/12 138/68   Excellent Blood pressure readings w/o benefit of medications.

## 2012-11-05 NOTE — Assessment & Plan Note (Signed)
Lab reveals an LDL of 107 - bascially at goal of 100 or less.  Plan  No indication for medical management

## 2012-11-05 NOTE — Assessment & Plan Note (Signed)
Moderate to severe OSA with last study in '03. Last visit to Dr. Shelle Iron '04  Plan Should continue CPAP  Should have follow up with Dr. Shelle Iron  Needs to address obesity - a primary cause for OSA.

## 2012-11-05 NOTE — Assessment & Plan Note (Signed)
Symptoms are currently well controlled.

## 2012-11-05 NOTE — Assessment & Plan Note (Signed)
Interval history w/o major illness, surgery or injury. Physical exam notable for obesity. Current with colorectal cancer screening and aged out of prostate cancer screening. Immunizations are up to date.  In summary - A nice man who seems medically stable except for not being able to bring his weight under control - a major health risk.

## 2012-11-09 ENCOUNTER — Encounter: Payer: Self-pay | Admitting: Internal Medicine

## 2012-11-11 ENCOUNTER — Other Ambulatory Visit: Payer: Self-pay | Admitting: Internal Medicine

## 2012-11-11 DIAGNOSIS — D568 Other thalassemias: Secondary | ICD-10-CM

## 2012-11-15 ENCOUNTER — Other Ambulatory Visit (INDEPENDENT_AMBULATORY_CARE_PROVIDER_SITE_OTHER): Payer: 59

## 2012-11-15 ENCOUNTER — Other Ambulatory Visit: Payer: Self-pay | Admitting: Internal Medicine

## 2012-11-15 DIAGNOSIS — D568 Other thalassemias: Secondary | ICD-10-CM

## 2012-11-15 DIAGNOSIS — D509 Iron deficiency anemia, unspecified: Secondary | ICD-10-CM

## 2012-11-15 LAB — CBC WITH DIFFERENTIAL/PLATELET
Basophils Absolute: 0 10*3/uL (ref 0.0–0.1)
Eosinophils Relative: 2.9 % (ref 0.0–5.0)
HCT: 35.2 % — ABNORMAL LOW (ref 39.0–52.0)
Hemoglobin: 10.9 g/dL — ABNORMAL LOW (ref 13.0–17.0)
Lymphs Abs: 2.3 10*3/uL (ref 0.7–4.0)
MCV: 56.8 fl — ABNORMAL LOW (ref 78.0–100.0)
Monocytes Absolute: 0.7 10*3/uL (ref 0.1–1.0)
Monocytes Relative: 12.3 % — ABNORMAL HIGH (ref 3.0–12.0)
Neutro Abs: 2.6 10*3/uL (ref 1.4–7.7)
Platelets: 140 10*3/uL — ABNORMAL LOW (ref 150.0–400.0)
RDW: 20.1 % — ABNORMAL HIGH (ref 11.5–14.6)

## 2012-11-16 ENCOUNTER — Encounter: Payer: Self-pay | Admitting: Internal Medicine

## 2012-11-16 DIAGNOSIS — D568 Other thalassemias: Secondary | ICD-10-CM

## 2012-11-17 ENCOUNTER — Other Ambulatory Visit: Payer: Self-pay | Admitting: Internal Medicine

## 2012-11-17 DIAGNOSIS — D568 Other thalassemias: Secondary | ICD-10-CM

## 2012-11-18 ENCOUNTER — Telehealth: Payer: Self-pay | Admitting: Internal Medicine

## 2012-11-18 NOTE — Telephone Encounter (Signed)
C/D 11/18/12 for appt. 12/12/12

## 2012-11-18 NOTE — Telephone Encounter (Signed)
S/W PT IN REF TO NP APPT ON 12/12/12@1 :30 DR NORRINS DX-IRON DEF. ANEMIA AND THALASSEMIA MAILED NP PACKET

## 2012-12-12 ENCOUNTER — Ambulatory Visit: Payer: 59

## 2012-12-12 ENCOUNTER — Telehealth: Payer: Self-pay | Admitting: Internal Medicine

## 2012-12-12 ENCOUNTER — Encounter: Payer: Self-pay | Admitting: Internal Medicine

## 2012-12-12 ENCOUNTER — Other Ambulatory Visit: Payer: 59 | Admitting: Lab

## 2012-12-12 ENCOUNTER — Ambulatory Visit (HOSPITAL_BASED_OUTPATIENT_CLINIC_OR_DEPARTMENT_OTHER): Payer: 59 | Admitting: Lab

## 2012-12-12 ENCOUNTER — Ambulatory Visit (HOSPITAL_BASED_OUTPATIENT_CLINIC_OR_DEPARTMENT_OTHER): Payer: 59 | Admitting: Internal Medicine

## 2012-12-12 VITALS — BP 117/58 | HR 52 | Temp 97.0°F | Resp 20 | Ht 67.0 in | Wt 267.0 lb

## 2012-12-12 DIAGNOSIS — D509 Iron deficiency anemia, unspecified: Secondary | ICD-10-CM

## 2012-12-12 DIAGNOSIS — D539 Nutritional anemia, unspecified: Secondary | ICD-10-CM

## 2012-12-12 DIAGNOSIS — D568 Other thalassemias: Secondary | ICD-10-CM

## 2012-12-12 DIAGNOSIS — D569 Thalassemia, unspecified: Secondary | ICD-10-CM

## 2012-12-12 LAB — CBC & DIFF AND RETIC
Basophils Absolute: 0 10*3/uL (ref 0.0–0.1)
EOS%: 2.7 % (ref 0.0–7.0)
Eosinophils Absolute: 0.1 10*3/uL (ref 0.0–0.5)
HCT: 35.4 % — ABNORMAL LOW (ref 38.4–49.9)
HGB: 11.1 g/dL — ABNORMAL LOW (ref 13.0–17.1)
Immature Retic Fract: 14.5 % — ABNORMAL HIGH (ref 3.00–10.60)
MCH: 17.3 pg — ABNORMAL LOW (ref 27.2–33.4)
MCV: 55.3 fL — ABNORMAL LOW (ref 79.3–98.0)
NEUT%: 51.4 % (ref 39.0–75.0)
lymph#: 2 10*3/uL (ref 0.9–3.3)

## 2012-12-12 LAB — COMPREHENSIVE METABOLIC PANEL (CC13)
ALT: 19 U/L (ref 0–55)
AST: 21 U/L (ref 5–34)
Alkaline Phosphatase: 86 U/L (ref 40–150)
BUN: 22.4 mg/dL (ref 7.0–26.0)
CO2: 24 mEq/L (ref 22–29)
Calcium: 9.7 mg/dL (ref 8.4–10.4)
Chloride: 108 mEq/L (ref 98–109)
Creatinine: 1.1 mg/dL (ref 0.7–1.3)
Glucose: 81 mg/dl (ref 70–140)
Total Bilirubin: 0.96 mg/dL (ref 0.20–1.20)

## 2012-12-12 LAB — IRON AND TIBC CHCC
%SAT: 5 % — ABNORMAL LOW (ref 20–55)
Iron: 30 ug/dL — ABNORMAL LOW (ref 42–163)
UIBC: 539 ug/dL — ABNORMAL HIGH (ref 117–376)

## 2012-12-12 LAB — TECHNOLOGIST REVIEW

## 2012-12-12 LAB — FERRITIN CHCC: Ferritin: 8 ng/ml — ABNORMAL LOW (ref 22–316)

## 2012-12-12 NOTE — Progress Notes (Signed)
[facility] [phone]   [phone]  CONSULT NOTE  REFERRING PHYSICIAN: Dr. Casimiro Needle Norrins  REASON FOR CONSULTATION:  79 years old white male with history of Thalassemia and persistent anemia  HPI Jacob [name] is a 79 y.o. male originally from [place] with past medical history significant for benign prostatic hypertrophy, mitral valve prolapse, obstructive sleep apnea, kidney stone, thalassemia, gout, AV block, anemia and GERD. The patient was seen recently by his primary care physician Dr. Lyman Bishop and was noted to have persistent anemia. His hemoglobin and hematocrit were much lower than before. Recent CBC on 11/15/2012 showed hemoglobin of 10.9, hematocrit 35.2% with MCV of 56.8. The patient has a history of thalassemia but his hemoglobin has always been in the range of 12.0 g/dL. The patient has always been doing fine with no significant complaints from his anemia. He denied having any significant fatigue or weakness. He denied having any dizzy spells. The patient denied having any bleeding issues. He had a colonoscopy performed on 04/05/2008 as well as upper endoscopy on 09/19/2009 with no significant findings except for a small polyp in the ascending colon that was removed in addition to internal hemorrhoids. Note that the patient had partial gastrectomy for duodenal ulcer several years ago. He is currently on folic acid and vitamin B 12 supplements. He does not take any iron supplements secondary to constipation on upset stomach.  The patient denied having any other significant complaints today. His mother has history of thalassemia. The patient has 8 daughters, sixth of them have thalassemia.  He is married and has a daughters. He worked in Public house manager, Education officer, environmental and in Scientist, physiological. He quit smoking in 1970 and quit drinking alcohol in 1987. There is no history of drug abuse. @SFHPI @  Past Medical History  Diagnosis Date  .  Benign prostatic hypertrophy     hx of  . Other and unspecified hyperlipidemia   . Personal history of unspecified disorders of nervous system and sense organs   . Mitral valve disorders   . Unspecified sleep apnea   . Morbid obesity   . Personal history of urinary calculi   . Diverticulosis of colon (without mention of hemorrhage)   . Other thalassemia   . First degree atrioventricular block   . Gout   . Osteoarthritis   . Basal cell carcinoma   . Anemia   . GERD (gastroesophageal reflux disease)   . Peptic ulcer with hemorrhage     duodenum    Past Surgical History  Procedure Laterality Date  . Tumor removed      from finger- left index  . Moles removed    . Ankle surgery      ORIF right '78  . Cataract extraction, bilateral      hx of  . Gastrectomy      hx of '56. Transfused 7 pints blood '55, 4 pints '56    Family History  Problem Relation Age of Onset  . Coronary artery disease Father   . Heart attack Father   . Gout Father   . Cancer Daughter     skin    Social History History  Substance Use Topics  . Smoking status: Former Smoker    Types: Cigarettes    Start date: 01/21/1970  . Smokeless tobacco: Never Used  . Alcohol Use: No    Allergies  Allergen Reactions  . Prednisone     REACTION: GI bleed  . Sulfonamide Derivatives  REACTION: Rash    Current Outpatient Prescriptions  Medication Sig Dispense Refill  . acetaminophen (TYLENOL) 500 MG tablet Take 500 mg by mouth every 6 (six) hours as needed for pain (take 2 tablets prn).      Marland Kitchen allopurinol (ZYLOPRIM) 300 MG tablet Take 1 tablet (300 mg total) by mouth daily.  90 tablet  3  . aspirin 81 MG tablet Take 81 mg by mouth daily.        Marland Kitchen b complex vitamins capsule Take 1 capsule by mouth daily.      . Cholecalciferol (VITAMIN D3) 2000 UNITS capsule Take 1 capsule (2,000 Units total) by mouth daily.      . Coenzyme Q-10 100 MG capsule Take 300 mg by mouth daily.       . colchicine 0.6 MG  tablet Take  2 tab , then take 1 tab within 2hrs and repeat the next day if pain persist      . Cyanocobalamin (VITAMIN B 12 PO) Take by mouth.        . diphenhydramine-acetaminophen (TYLENOL PM) 25-500 MG TABS Take 2 tablets by mouth at bedtime as needed.  14 tablet    . folic acid (CVS FOLIC ACID) 400 MCG tablet Take 600 mcg by mouth daily.       Marland Kitchen ibuprofen (ADVIL,MOTRIN) 200 MG tablet Take 200 mg by mouth every 6 (six) hours as needed for pain.      Marland Kitchen loratadine (CLARITIN) 10 MG tablet Take 10 mg by mouth daily.      . Multiple Vitamins-Minerals (CENTRUM SILVER ULTRA MENS PO) Take 1 capsule by mouth daily.      . Omega-3 Fatty Acids (FISH OIL) 600 MG CAPS Take by mouth.      Marland Kitchen omeprazole (PRILOSEC) 20 MG capsule Take 20 mg by mouth daily as needed.       . saw palmetto 160 MG capsule 160 mg. Take 2 tabs  daily      . vitamin C (ASCORBIC ACID) 500 MG tablet Take 500 mg by mouth daily.       No current facility-administered medications for this visit.    Review of Systems  Constitutional: negative Eyes: negative Ears, nose, mouth, throat, and face: negative Respiratory: negative Cardiovascular: negative Gastrointestinal: negative Genitourinary:negative Integument/breast: negative Hematologic/lymphatic: negative Musculoskeletal:negative Neurological: negative Behavioral/Psych: negative Endocrine: negative Allergic/Immunologic: negative  Physical Exam  HQI:ONGEX, healthy, no distress, well nourished and well developed SKIN: skin color, texture, turgor are normal, no rashes or significant lesions HEAD: Normocephalic, No masses, lesions, tenderness or abnormalities EYES: normal, PERRLA EARS: External ears normal, Canals clear OROPHARYNX:no exudate, no erythema and lips, buccal mucosa, and tongue normal  NECK: supple, no adenopathy, no JVD LYMPH:  no palpable lymphadenopathy, no hepatosplenomegaly LUNGS: clear to auscultation , and palpation HEART: regular rate & rhythm, no  murmurs and no gallops ABDOMEN:abdomen soft, non-tender, obese, normal bowel sounds and no masses or organomegaly BACK: Back symmetric, no curvature., No CVA tenderness EXTREMITIES:no joint deformities, effusion, or inflammation, no edema, no skin discoloration, no clubbing  NEURO: alert & oriented x 3 with fluent speech, no focal motor/sensory deficits  PERFORMANCE STATUS: ECOG 0  LABORATORY DATA: Lab Results  Component Value Date   WBC 5.3 12/12/2012   HGB 11.1* 12/12/2012   HCT 35.4* 12/12/2012   MCV 55.3* 12/12/2012   PLT 141 Large platelets present 12/12/2012      Chemistry      Component Value Date/Time   NA 141 12/12/2012 1522  NA 138 11/03/2012 0735   K 4.2 12/12/2012 1522   K 4.5 11/03/2012 0735   CL 104 11/03/2012 0735   CO2 24 12/12/2012 1522   CO2 27 11/03/2012 0735   BUN 22.4 12/12/2012 1522   BUN 22 11/03/2012 0735   CREATININE 1.1 12/12/2012 1522   CREATININE 1.0 11/03/2012 0735      Component Value Date/Time   CALCIUM 9.7 12/12/2012 1522   CALCIUM 9.5 11/03/2012 0735   ALKPHOS 86 12/12/2012 1522   ALKPHOS 72 11/03/2012 0735   AST 21 12/12/2012 1522   AST 17 11/03/2012 0735   ALT 19 12/12/2012 1522   ALT 17 11/03/2012 0735   BILITOT 0.96 12/12/2012 1522   BILITOT 0.9 11/03/2012 0735       RADIOGRAPHIC STUDIES: No results found.  ASSESSMENT: This is a very pleasant 79 years old white male with history of B-thalassemia minor and persistent microcytic anemia. The patient has been doing fine for several years with no significant fatigue or weakness. His hemoglobin and hematocrit has declined recently.   PLAN: I have a lengthy discussion with the patient today about his condition. I recommended for him to rule out any other etiology for his anemia besides the beta thalassemia. I ordered several studies today including repeat hemoglobin electrophoresis, CBC, comprehensive metabolic panel, LDH, iron study, ferritin, serum erythropoietin, serum folate and vitamin B 12. I will  see the patient back for followup visit in 3 weeks for evaluation and discussion of his lab results. The patient is currently asymptomatic and I recommended for him to continue on observation for now. I would consider him for treatment with iron supplement if he has any evidence for iron deficiency anemia. I gave the patient a time to ask questions and I answered them completely to his satisfaction. He was advised to call immediately if he has any concerning symptoms in the interval.  The patient voices understanding of current disease status and treatment options and is in agreement with the current care plan.  All questions were answered. The patient knows to call the clinic with any problems, questions or concerns. We can certainly see the patient much sooner if necessary.  Thank you so much for allowing me to participate in the care of [employer]. I will continue to follow up the patient with you and assist in his care.  I spent 40 minutes counseling the patient face to face. The total time spent in the appointment was 60 minutes.  Darenda Fike K. 12/12/2012, 4:57 PM

## 2012-12-12 NOTE — Patient Instructions (Signed)
Followup visit in 3 weeks to discuss the lab results

## 2012-12-12 NOTE — Telephone Encounter (Signed)
gv and printed appt sched and avs for pt fro OCT and sent pt back to lab

## 2012-12-14 LAB — HEMOGLOBINOPATHY EVALUATION
Hemoglobin Other: 0 %
Hgb A2 Quant: 5.3 % — ABNORMAL HIGH (ref 2.2–3.2)
Hgb A: 94.5 % — ABNORMAL LOW (ref 96.8–97.8)
Hgb F Quant: 0.2 % (ref 0.0–2.0)
Hgb S Quant: 0 %

## 2012-12-14 LAB — FOLATE: Folate: >20 ng/mL

## 2012-12-14 LAB — ERYTHROPOIETIN: Erythropoietin: 21.2 m[IU]/mL — ABNORMAL HIGH (ref 2.6–18.5)

## 2012-12-14 LAB — VITAMIN B12: Vitamin B-12: 1300 pg/mL — ABNORMAL HIGH (ref 211–911)

## 2012-12-16 ENCOUNTER — Encounter: Payer: Self-pay | Admitting: Internal Medicine

## 2012-12-25 ENCOUNTER — Encounter: Payer: Self-pay | Admitting: Internal Medicine

## 2012-12-27 ENCOUNTER — Encounter: Payer: Self-pay | Admitting: Internal Medicine

## 2012-12-28 ENCOUNTER — Telehealth: Payer: Self-pay | Admitting: *Deleted

## 2012-12-28 ENCOUNTER — Encounter: Payer: Self-pay | Admitting: Internal Medicine

## 2012-12-28 NOTE — Telephone Encounter (Signed)
Called patient to let him know I have taken his 5 email questions to Dr Asa Lente RN for his appointment on Monday. To call us back if he has further questions

## 2013-01-02 ENCOUNTER — Encounter: Payer: Self-pay | Admitting: Internal Medicine

## 2013-01-02 ENCOUNTER — Ambulatory Visit (HOSPITAL_BASED_OUTPATIENT_CLINIC_OR_DEPARTMENT_OTHER): Payer: 59 | Admitting: Internal Medicine

## 2013-01-02 ENCOUNTER — Telehealth: Payer: Self-pay | Admitting: Internal Medicine

## 2013-01-02 VITALS — BP 137/54 | HR 68 | Temp 97.6°F | Resp 20 | Ht 67.0 in | Wt 263.0 lb

## 2013-01-02 DIAGNOSIS — D509 Iron deficiency anemia, unspecified: Secondary | ICD-10-CM

## 2013-01-02 DIAGNOSIS — D568 Other thalassemias: Secondary | ICD-10-CM

## 2013-01-02 MED ORDER — INTEGRA PLUS PO CAPS: 1.0000 | ORAL_CAPSULE | Freq: Every morning | ORAL | Status: DC

## 2013-01-02 NOTE — Progress Notes (Signed)
[facility] [phone]) [phone]   [phone]  OFFICE PROGRESS NOTE  Illene Regulus, MD [address] [place] [address]  DIAGNOSIS: History of beta thalassemia minor and iron deficiency anemia  PRIOR THERAPY: None  CURRENT THERAPY: Integra plus 1 capsule by mouth daily.  INTERVAL HISTORY: Jacob [name] 79 y.o. male returns to the clinic today for followup visit. The patient is feeling fine today with no specific complaints. He was seen a few weeks ago for evaluation of his persistent anemia and I ordered several studies for evaluation of his anemia including hemoglobin electrophoresis which showed pattern consistent with thalassemia minor. The patient also has iron study that showed a serum iron of 30, total iron-binding capacity 569 and iron saturation 5%. Ferritin was also low at 8. The patient has normal serum folate and elevated vitamin B12 level as well as a slightly elevated erythropoietin level. He is here today for evaluation and discussion of his lab results and recommendation regarding treatment of his condition. He denied having any significant fatigue or weakness, no chest pain, shortness of breath, cough or hemoptysis. He has no significant weight loss or night sweats.  MEDICAL HISTORY: Past Medical History  Diagnosis Date  . Benign prostatic hypertrophy     hx of  . Other and unspecified hyperlipidemia   . Personal history of unspecified disorders of nervous system and sense organs   . Mitral valve disorders   . Unspecified sleep apnea   . Morbid obesity   . Personal history of urinary calculi   . Diverticulosis of colon (without mention of hemorrhage)   . Other thalassemia   . First degree atrioventricular block   . Gout   . Osteoarthritis   . Basal cell carcinoma   . Anemia   . GERD (gastroesophageal reflux disease)   . Peptic ulcer with hemorrhage     duodenum    ALLERGIES:  is allergic to prednisone and  sulfonamide derivatives.  MEDICATIONS:  Current Outpatient Prescriptions  Medication Sig Dispense Refill  . acetaminophen (TYLENOL) 500 MG tablet Take 500 mg by mouth every 6 (six) hours as needed for pain (take 2 tablets prn).      Marland Kitchen allopurinol (ZYLOPRIM) 300 MG tablet Take 1 tablet (300 mg total) by mouth daily.  90 tablet  3  . aspirin 81 MG tablet Take 81 mg by mouth daily.        Marland Kitchen b complex vitamins capsule Take 1 capsule by mouth daily.      . Cholecalciferol (VITAMIN D3) 2000 UNITS capsule Take 1 capsule (2,000 Units total) by mouth daily.      . Coenzyme Q-10 100 MG capsule Take 300 mg by mouth daily.       . colchicine 0.6 MG tablet Take  2 tab , then take 1 tab within 2hrs and repeat the next day if pain persist      . Cyanocobalamin (VITAMIN B 12 PO) Take by mouth.        . diphenhydramine-acetaminophen (TYLENOL PM) 25-500 MG TABS Take 2 tablets by mouth at bedtime as needed.  14 tablet    . folic acid (CVS FOLIC ACID) 400 MCG tablet Take 600 mcg by mouth daily.       Marland Kitchen ibuprofen (ADVIL,MOTRIN) 200 MG tablet Take 200 mg by mouth every 6 (six) hours as needed for pain.      Marland Kitchen loratadine (CLARITIN) 10 MG tablet Take 10 mg by mouth daily.      Marland Kitchen  Multiple Vitamins-Minerals (CENTRUM SILVER ULTRA MENS PO) Take 1 capsule by mouth daily.      . Omega-3 Fatty Acids (FISH OIL) 600 MG CAPS Take by mouth.      Marland Kitchen omeprazole (PRILOSEC) 20 MG capsule Take 20 mg by mouth daily as needed.       . saw palmetto 160 MG capsule 160 mg. Take 2 tabs  daily      . vitamin C (ASCORBIC ACID) 500 MG tablet Take 500 mg by mouth daily.       No current facility-administered medications for this visit.    SURGICAL HISTORY:  Past Surgical History  Procedure Laterality Date  . Tumor removed      from finger- left index  . Moles removed    . Ankle surgery      ORIF right '78  . Cataract extraction, bilateral      hx of  . Gastrectomy      hx of '56. Transfused 7 pints blood '55, 4 pints '56     REVIEW OF SYSTEMS:  A comprehensive review of systems was negative.   PHYSICAL EXAMINATION: General appearance: alert, cooperative and no distress Head: Normocephalic, without obvious abnormality, atraumatic Neck: no adenopathy, no JVD, supple, symmetrical, trachea midline and thyroid not enlarged, symmetric, no tenderness/mass/nodules Lymph nodes: Cervical, supraclavicular, and axillary nodes normal. Resp: clear to auscultation bilaterally Back: symmetric, no curvature. ROM normal. No CVA tenderness. Cardio: regular rate and rhythm, S1, S2 normal, no murmur, click, rub or gallop GI: soft, non-tender; bowel sounds normal; no masses,  no organomegaly Extremities: extremities normal, atraumatic, no cyanosis or edema  ECOG PERFORMANCE STATUS: 0 - Asymptomatic  Blood pressure 137/54, pulse 68, temperature 97.6 F (36.4 C), temperature source Oral, resp. rate 20, height 5\' 7"  (1.702 m), weight 263 lb (119.296 kg).  LABORATORY DATA: Lab Results  Component Value Date   WBC 5.3 12/12/2012   HGB 11.1* 12/12/2012   HCT 35.4* 12/12/2012   MCV 55.3* 12/12/2012   PLT 141 Large platelets present 12/12/2012      Chemistry      Component Value Date/Time   NA 141 12/12/2012 1522   NA 138 11/03/2012 0735   K 4.2 12/12/2012 1522   K 4.5 11/03/2012 0735   CL 104 11/03/2012 0735   CO2 24 12/12/2012 1522   CO2 27 11/03/2012 0735   BUN 22.4 12/12/2012 1522   BUN 22 11/03/2012 0735   CREATININE 1.1 12/12/2012 1522   CREATININE 1.0 11/03/2012 0735      Component Value Date/Time   CALCIUM 9.7 12/12/2012 1522   CALCIUM 9.5 11/03/2012 0735   ALKPHOS 86 12/12/2012 1522   ALKPHOS 72 11/03/2012 0735   AST 21 12/12/2012 1522   AST 17 11/03/2012 0735   ALT 19 12/12/2012 1522   ALT 17 11/03/2012 0735   BILITOT 0.96 12/12/2012 1522   BILITOT 0.9 11/03/2012 0735       RADIOGRAPHIC STUDIES: No results found.  ASSESSMENT AND PLAN: This is a very pleasant 79 years old white male with beta thalassemia minor as well  as iron deficiency anemia. I discussed the lab result with the patient today. I recommended for him to start tablets in the form of Integra plus 1 capsule by mouth daily. I also gave him the option of Feraheme infusion intravenously. He prefers to proceed with the iron tablets for now. I would see him back for followup visit in 2 months with repeat CBC, iron study and ferritin. He was  advised to call immediately if he has any concerning symptoms in the interval. The patient voices understanding of current disease status and treatment options and is in agreement with the current care plan.  All questions were answered. The patient knows to call the clinic with any problems, questions or concerns. We can certainly see the patient much sooner if necessary.

## 2013-01-02 NOTE — Telephone Encounter (Signed)
gv adn printed appt sched and avs for pt for Dec °

## 2013-01-02 NOTE — Patient Instructions (Signed)
Follow up visit in 2 months with repeat CBC, iron study and ferritin 

## 2013-01-04 ENCOUNTER — Encounter: Payer: Self-pay | Admitting: Internal Medicine

## 2013-01-05 ENCOUNTER — Telehealth: Payer: Self-pay | Admitting: Internal Medicine

## 2013-01-05 NOTE — Telephone Encounter (Signed)
s.w. pt and advised on dec lab moved two days b4 per pt request

## 2013-02-06 ENCOUNTER — Ambulatory Visit (INDEPENDENT_AMBULATORY_CARE_PROVIDER_SITE_OTHER): Payer: 59 | Admitting: Cardiology

## 2013-02-06 ENCOUNTER — Encounter: Payer: Self-pay | Admitting: Cardiology

## 2013-02-06 VITALS — BP 126/64 | HR 67 | Ht 67.0 in | Wt 245.0 lb

## 2013-02-06 DIAGNOSIS — R002 Palpitations: Secondary | ICD-10-CM

## 2013-02-06 DIAGNOSIS — I10 Essential (primary) hypertension: Secondary | ICD-10-CM

## 2013-02-06 NOTE — Patient Instructions (Signed)
The current medical regimen is effective;  continue present plan and medications.  Your physician has requested that you have an exercise tolerance test. For further information please visit [email or web address]. Please also follow instruction sheet, as given.   

## 2013-02-06 NOTE — Progress Notes (Signed)
HPI The patient presents for followup of an abnormal EKG.  He has a past history of palpitations  He is having no active problem with these. He denies anypresyncope or syncope. He has had no chest pressure, neck or arm discomfort. He has had no shortness of breath, PND or orthopnea. He has had no weight gain or edema.  He has lost weight with diet and exercise.  We spent a long time talking about his exercise plan.  He is doing intervals.    Allergies  Allergen Reactions  . Prednisone     REACTION: GI bleed  . Sulfonamide Derivatives     REACTION: Rash    Current Outpatient Prescriptions  Medication Sig Dispense Refill  . acetaminophen (TYLENOL) 500 MG tablet Take 500 mg by mouth every 6 (six) hours as needed for pain (take 2 tablets prn).      Marland Kitchen allopurinol (ZYLOPRIM) 300 MG tablet Take 1 tablet (300 mg total) by mouth daily.  90 tablet  3  . aspirin 81 MG tablet Take 81 mg by mouth daily.        Marland Kitchen b complex vitamins capsule Take 1 capsule by mouth daily.      . Cholecalciferol (VITAMIN D3) 2000 UNITS capsule Take 1 capsule (2,000 Units total) by mouth daily.      . Coenzyme Q-10 100 MG capsule Take 300 mg by mouth daily.       . colchicine 0.6 MG tablet Take  2 tab , then take 1 tab within 2hrs and repeat the next day if pain persist      . Cyanocobalamin (VITAMIN B 12 PO) Take by mouth.        . diphenhydramine-acetaminophen (TYLENOL PM) 25-500 MG TABS Take 2 tablets by mouth at bedtime as needed.  14 tablet    . FeFum-FePoly-FA-B Cmp-C-Biot (INTEGRA PLUS) CAPS Take 1 capsule by mouth every morning.  30 capsule  2  . ibuprofen (ADVIL,MOTRIN) 200 MG tablet Take 200 mg by mouth every 6 (six) hours as needed for pain.      Marland Kitchen loratadine (CLARITIN) 10 MG tablet Take 10 mg by mouth daily.      . Multiple Vitamins-Minerals (CENTRUM SILVER ULTRA MENS PO) Take 1 capsule by mouth daily.      . Omega-3 Fatty Acids (FISH OIL) 600 MG CAPS Take by mouth.      Marland Kitchen omeprazole (PRILOSEC) 20 MG capsule  Take 20 mg by mouth daily as needed.       . saw palmetto 160 MG capsule 160 mg. Take 2 tabs  daily       No current facility-administered medications for this visit.    Past Medical History  Diagnosis Date  . Benign prostatic hypertrophy     hx of  . Other and unspecified hyperlipidemia   . Personal history of unspecified disorders of nervous system and sense organs   . Mitral valve disorders   . Unspecified sleep apnea   . Morbid obesity   . Personal history of urinary calculi   . Diverticulosis of colon (without mention of hemorrhage)   . Other thalassemia   . First degree atrioventricular block   . Gout   . Osteoarthritis   . Basal cell carcinoma   . Anemia   . GERD (gastroesophageal reflux disease)   . Peptic ulcer with hemorrhage     duodenum    Past Surgical History  Procedure Laterality Date  . Tumor removed  from finger- left index  . Moles removed    . Ankle surgery      ORIF right '78  . Cataract extraction, bilateral      hx of  . Gastrectomy      hx of '56. Transfused 7 pints blood '55, 4 pints '56    ROS:  As stated in the HPI and negative for all other systems.  PHYSICAL EXAM BP 126/64  Pulse 67  Ht 5\' 7"  (1.702 m)  Wt 245 lb (111.131 kg)  BMI 38.36 kg/m2 GENERAL:  Well appearing HEENT:  Pupils equal round and reactive, fundi not visualized, oral mucosa unremarkable NECK:  No jugular venous distention, waveform within normal limits, carotid upstroke brisk and symmetric, no bruits, no thyromegaly LUNGS:  Clear to auscultation bilaterally BACK:  No CVA tenderness CHEST:  Unremarkable HEART:  PMI not displaced or sustained,S1 and S2 within normal limits, no S3, no S4, no clicks, no rubs, no murmurs ABD:  Flat, positive bowel sounds normal in frequency in pitch, no bruits, no rebound, no guarding, no midline pulsatile mass, no hepatomegaly, no splenomegaly, obese EXT:  2 plus pulses throughout, no edema, no cyanosis no clubbing SKIN:  No rashes  no nodules   EKG:  Normal sinus rhythm, rate 67, left axis deviation, marked first degree AV block, poor anterior R wave progression, no change from previous. 02/06/2013   ASSESSMENT AND PLAN   MORBID OBESITY, HX OF -  I am very proud of his weight loss and I encourage more of the same.  I will bring the patient back for a POET (Plain Old Exercise Test). This will allow me to screen for obstructive coronary disease, risk stratify and very importantly provide a prescription for exercise. This will allow me to clear him for exercise  BLOCK, AV, 1ST DEGREE -  He continues to have no symptoms related to this.  No change in therapy is indicated.

## 2013-02-11 ENCOUNTER — Encounter: Payer: Self-pay | Admitting: Cardiology

## 2013-02-15 ENCOUNTER — Encounter (HOSPITAL_COMMUNITY): Payer: 59

## 2013-02-24 ENCOUNTER — Other Ambulatory Visit (HOSPITAL_BASED_OUTPATIENT_CLINIC_OR_DEPARTMENT_OTHER): Payer: 59

## 2013-02-24 DIAGNOSIS — D509 Iron deficiency anemia, unspecified: Secondary | ICD-10-CM

## 2013-02-24 DIAGNOSIS — D568 Other thalassemias: Secondary | ICD-10-CM

## 2013-02-24 LAB — CBC WITH DIFFERENTIAL/PLATELET
BASO%: 0.5 % (ref 0.0–2.0)
Basophils Absolute: 0 10*3/uL (ref 0.0–0.1)
EOS%: 1.9 % (ref 0.0–7.0)
HCT: 37.9 % — ABNORMAL LOW (ref 38.4–49.9)
LYMPH%: 31.3 % (ref 14.0–49.0)
MCH: 18.9 pg — ABNORMAL LOW (ref 27.2–33.4)
MCHC: 33.2 g/dL (ref 32.0–36.0)
NEUT%: 59.4 % (ref 39.0–75.0)
RBC: 6.65 10*6/uL — ABNORMAL HIGH (ref 4.20–5.82)
RDW: 21.1 % — ABNORMAL HIGH (ref 11.0–14.6)
lymph#: 1.9 10*3/uL (ref 0.9–3.3)
nRBC: 0 % (ref 0–0)

## 2013-02-24 LAB — IRON AND TIBC CHCC: Iron: 109 ug/dL (ref 42–163)

## 2013-02-24 LAB — TECHNOLOGIST REVIEW

## 2013-02-24 LAB — FERRITIN CHCC: Ferritin: 26 ng/ml (ref 22–316)

## 2013-02-28 ENCOUNTER — Other Ambulatory Visit: Payer: 59 | Admitting: Lab

## 2013-02-28 ENCOUNTER — Encounter: Payer: Self-pay | Admitting: Internal Medicine

## 2013-02-28 ENCOUNTER — Ambulatory Visit (HOSPITAL_BASED_OUTPATIENT_CLINIC_OR_DEPARTMENT_OTHER): Payer: 59 | Admitting: Internal Medicine

## 2013-02-28 VITALS — BP 137/72 | HR 60 | Temp 97.3°F | Resp 18 | Ht 67.0 in | Wt 242.0 lb

## 2013-02-28 DIAGNOSIS — D509 Iron deficiency anemia, unspecified: Secondary | ICD-10-CM | POA: Insufficient documentation

## 2013-02-28 DIAGNOSIS — D563 Thalassemia minor: Secondary | ICD-10-CM

## 2013-02-28 NOTE — Progress Notes (Signed)
[facility] Telephone:([phone]   [phone]  OFFICE PROGRESS NOTE  Illene Regulus, MD [address] [place] [address]  DIAGNOSIS: History of beta thalassemia minor and iron deficiency anemia  PRIOR THERAPY: None  CURRENT THERAPY: Integra plus 1 capsule by mouth daily.  INTERVAL HISTORY: Jacob [name] 79 y.o. male returns to the clinic today for followup visit. The patient is feeling fine today with no specific complaints. He denied having any significant fever or chills, no nausea or vomiting. He denied having any significant fatigue or weakness, no chest pain, shortness of breath, cough or hemoptysis. He has no significant weight loss or night sweats. He had repeat CBC and iron study performed recently and he is here for evaluation and discussion of his lab results. He is tolerating his treatment with Integra plus fairly well.   MEDICAL HISTORY: Past Medical History  Diagnosis Date  . Benign prostatic hypertrophy     hx of  . Other and unspecified hyperlipidemia   . Personal history of unspecified disorders of nervous system and sense organs   . Mitral valve disorders   . Unspecified sleep apnea   . Morbid obesity   . Personal history of urinary calculi   . Diverticulosis of colon (without mention of hemorrhage)   . Other thalassemia   . First degree atrioventricular block   . Gout   . Osteoarthritis   . Basal cell carcinoma   . Anemia   . GERD (gastroesophageal reflux disease)   . Peptic ulcer with hemorrhage     duodenum    ALLERGIES:  is allergic to prednisone and sulfonamide derivatives.  MEDICATIONS:  Current Outpatient Prescriptions  Medication Sig Dispense Refill  . acetaminophen (TYLENOL) 500 MG tablet Take 500 mg by mouth every 6 (six) hours as needed for pain (take 2 tablets prn).      Marland Kitchen allopurinol (ZYLOPRIM) 300 MG tablet Take 1 tablet (300 mg total) by mouth daily.  90 tablet  3  . aspirin 81 MG tablet Take  81 mg by mouth daily.        Marland Kitchen b complex vitamins capsule Take 1 capsule by mouth daily.      . Cholecalciferol (VITAMIN D3) 2000 UNITS capsule Take 1 capsule (2,000 Units total) by mouth daily.      . Coenzyme Q-10 100 MG capsule Take 300 mg by mouth daily.       . colchicine 0.6 MG tablet Take  2 tab , then take 1 tab within 2hrs and repeat the next day if pain persist      . Cyanocobalamin (VITAMIN B 12 PO) Take by mouth.        . diphenhydramine-acetaminophen (TYLENOL PM) 25-500 MG TABS Take 2 tablets by mouth at bedtime as needed.  14 tablet    . FeFum-FePoly-FA-B Cmp-C-Biot (INTEGRA PLUS) CAPS Take 1 capsule by mouth every morning.  30 capsule  2  . ibuprofen (ADVIL,MOTRIN) 200 MG tablet Take 200 mg by mouth every 6 (six) hours as needed for pain.      Marland Kitchen loratadine (CLARITIN) 10 MG tablet Take 10 mg by mouth daily.      . Multiple Vitamins-Minerals (CENTRUM SILVER ULTRA MENS PO) Take 1 capsule by mouth daily.      . Omega-3 Fatty Acids (FISH OIL) 600 MG CAPS Take by mouth.      Marland Kitchen omeprazole (PRILOSEC) 20 MG capsule Take 20 mg by mouth daily as needed.       Marland Kitchen  saw palmetto 160 MG capsule 160 mg. Take 2 tabs  daily       No current facility-administered medications for this visit.    SURGICAL HISTORY:  Past Surgical History  Procedure Laterality Date  . Tumor removed      from finger- left index  . Moles removed    . Ankle surgery      ORIF right '78  . Cataract extraction, bilateral      hx of  . Gastrectomy      hx of '56. Transfused 7 pints blood '55, 4 pints '56    REVIEW OF SYSTEMS:  A comprehensive review of systems was negative.   PHYSICAL EXAMINATION: General appearance: alert, cooperative and no distress Head: Normocephalic, without obvious abnormality, atraumatic Neck: no adenopathy, no JVD, supple, symmetrical, trachea midline and thyroid not enlarged, symmetric, no tenderness/mass/nodules Lymph nodes: Cervical, supraclavicular, and axillary nodes normal. Resp:  clear to auscultation bilaterally Back: symmetric, no curvature. ROM normal. No CVA tenderness. Cardio: regular rate and rhythm, S1, S2 normal, no murmur, click, rub or gallop GI: soft, non-tender; bowel sounds normal; no masses,  no organomegaly Extremities: extremities normal, atraumatic, no cyanosis or edema  ECOG PERFORMANCE STATUS: 0 - Asymptomatic  Blood pressure 137/72, pulse 60, temperature 97.3 F (36.3 C), temperature source Oral, resp. rate 18, height 5\' 7"  (1.702 m), weight 242 lb (109.77 kg).  LABORATORY DATA: Lab Results  Component Value Date   WBC 6.2 02/24/2013   HGB 12.6* 02/24/2013   HCT 37.9* 02/24/2013   MCV 57.0* 02/24/2013   PLT 153 02/24/2013      Chemistry      Component Value Date/Time   NA 141 12/12/2012 1522   NA 138 11/03/2012 0735   K 4.2 12/12/2012 1522   K 4.5 11/03/2012 0735   CL 104 11/03/2012 0735   CO2 24 12/12/2012 1522   CO2 27 11/03/2012 0735   BUN 22.4 12/12/2012 1522   BUN 22 11/03/2012 0735   CREATININE 1.1 12/12/2012 1522   CREATININE 1.0 11/03/2012 0735      Component Value Date/Time   CALCIUM 9.7 12/12/2012 1522   CALCIUM 9.5 11/03/2012 0735   ALKPHOS 86 12/12/2012 1522   ALKPHOS 72 11/03/2012 0735   AST 21 12/12/2012 1522   AST 17 11/03/2012 0735   ALT 19 12/12/2012 1522   ALT 17 11/03/2012 0735   BILITOT 0.96 12/12/2012 1522   BILITOT 0.9 11/03/2012 0735     Other lab results: Ferritin 26, serum iron 109, total iron binding capacity 411 and iron saturation 27%.  RADIOGRAPHIC STUDIES: No results found.  ASSESSMENT AND PLAN: This is a very pleasant 79 years old white male with beta thalassemia minor as well as iron deficiency anemia. I discussed the lab result with the patient today. I recommended for him to continue on treatment with Integra plus 1 capsule by mouth daily. I would see him back for followup visit in 3 months with repeat CBC, iron study and ferritin. He was advised to call immediately if he has any concerning symptoms in  the interval. The patient voices understanding of current disease status and treatment options and is in agreement with the current care plan.  All questions were answered. The patient knows to call the clinic with any problems, questions or concerns. We can certainly see the patient much sooner if necessary.

## 2013-02-28 NOTE — Patient Instructions (Signed)
Follow up visit in 3 months with repeat CBC, iron study and ferritin 

## 2013-03-01 ENCOUNTER — Telehealth: Payer: Self-pay | Admitting: Internal Medicine

## 2013-03-01 NOTE — Telephone Encounter (Signed)
s.w. pt wife and advised on appt....ok and aware

## 2013-03-16 HISTORY — PX: COLONOSCOPY: SHX174

## 2013-03-21 ENCOUNTER — Encounter: Payer: Self-pay | Admitting: Internal Medicine

## 2013-03-27 ENCOUNTER — Encounter: Payer: Self-pay | Admitting: Cardiology

## 2013-03-27 ENCOUNTER — Telehealth: Payer: Self-pay | Admitting: Cardiology

## 2013-03-27 DIAGNOSIS — I440 Atrioventricular block, first degree: Secondary | ICD-10-CM

## 2013-03-27 DIAGNOSIS — I10 Essential (primary) hypertension: Secondary | ICD-10-CM

## 2013-03-27 DIAGNOSIS — E785 Hyperlipidemia, unspecified: Secondary | ICD-10-CM

## 2013-03-27 DIAGNOSIS — I059 Rheumatic mitral valve disease, unspecified: Secondary | ICD-10-CM

## 2013-03-27 NOTE — Telephone Encounter (Signed)
New message  Patient stated he sent Dr. Percival Spanish a message on my chart .   C/O having arrythmia, ingestion.

## 2013-03-27 NOTE — Telephone Encounter (Signed)
Having palpitations occuring on occasional with exercise.  Denies any s/s at this time.  States he is not exercising any more until after this gets checked out.  The last time he had palpitations was after exercise and it lasted the "whole weekend." Can feel a steady pulse but can feel his heart bumping against his chest.  Supposed to have GXT but had leg injury.  Wants to know if he can have a lexiscan myoview instead.  Will forward information to Dr Percival Spanish for review and orders.  Pt is aware I will call him back with recommendations.

## 2013-03-28 ENCOUNTER — Other Ambulatory Visit: Payer: Self-pay | Admitting: *Deleted

## 2013-03-28 ENCOUNTER — Encounter: Payer: Self-pay | Admitting: Internal Medicine

## 2013-03-28 DIAGNOSIS — D568 Other thalassemias: Secondary | ICD-10-CM

## 2013-03-28 DIAGNOSIS — D509 Iron deficiency anemia, unspecified: Secondary | ICD-10-CM

## 2013-03-28 MED ORDER — INTEGRA PLUS PO CAPS: 1.0000 | ORAL_CAPSULE | Freq: Every morning | ORAL | Status: DC

## 2013-03-29 ENCOUNTER — Encounter: Payer: Self-pay | Admitting: Cardiology

## 2013-03-29 NOTE — Telephone Encounter (Signed)
OK to have [employer].

## 2013-03-29 NOTE — Telephone Encounter (Signed)
Returned call to patient Dr.Hochrein advised ok to have a lexiscan myoview.

## 2013-04-04 ENCOUNTER — Encounter (HOSPITAL_COMMUNITY): Payer: 59

## 2013-04-04 ENCOUNTER — Encounter: Payer: Self-pay | Admitting: *Deleted

## 2013-04-07 ENCOUNTER — Encounter: Payer: Self-pay | Admitting: Cardiology

## 2013-04-12 ENCOUNTER — Encounter (HOSPITAL_COMMUNITY): Payer: 59

## 2013-04-18 ENCOUNTER — Encounter: Payer: Self-pay | Admitting: Cardiology

## 2013-04-18 ENCOUNTER — Telehealth: Payer: Self-pay | Admitting: Internal Medicine

## 2013-04-18 ENCOUNTER — Ambulatory Visit (HOSPITAL_COMMUNITY): Payer: Medicare Other | Attending: Cardiology | Admitting: Radiology

## 2013-04-18 VITALS — BP 125/56 | Ht 67.0 in | Wt 223.0 lb

## 2013-04-18 DIAGNOSIS — I10 Essential (primary) hypertension: Secondary | ICD-10-CM

## 2013-04-18 DIAGNOSIS — I059 Rheumatic mitral valve disease, unspecified: Secondary | ICD-10-CM

## 2013-04-18 DIAGNOSIS — R0609 Other forms of dyspnea: Secondary | ICD-10-CM | POA: Insufficient documentation

## 2013-04-18 DIAGNOSIS — R0989 Other specified symptoms and signs involving the circulatory and respiratory systems: Secondary | ICD-10-CM | POA: Insufficient documentation

## 2013-04-18 DIAGNOSIS — I440 Atrioventricular block, first degree: Secondary | ICD-10-CM

## 2013-04-18 DIAGNOSIS — Z8249 Family history of ischemic heart disease and other diseases of the circulatory system: Secondary | ICD-10-CM | POA: Insufficient documentation

## 2013-04-18 DIAGNOSIS — R002 Palpitations: Secondary | ICD-10-CM | POA: Insufficient documentation

## 2013-04-18 DIAGNOSIS — R42 Dizziness and giddiness: Secondary | ICD-10-CM | POA: Insufficient documentation

## 2013-04-18 DIAGNOSIS — R9431 Abnormal electrocardiogram [ECG] [EKG]: Secondary | ICD-10-CM

## 2013-04-18 DIAGNOSIS — Z87891 Personal history of nicotine dependence: Secondary | ICD-10-CM | POA: Insufficient documentation

## 2013-04-18 DIAGNOSIS — E785 Hyperlipidemia, unspecified: Secondary | ICD-10-CM

## 2013-04-18 MED ORDER — TECHNETIUM TC 99M SESTAMIBI GENERIC - CARDIOLITE
33.0000 | Freq: Once | INTRAVENOUS | Status: AC | PRN
  Administered 2013-04-18: 33 via INTRAVENOUS

## 2013-04-18 MED ORDER — REGADENOSON 0.4 MG/5ML IV SOLN
0.4000 mg | Freq: Once | INTRAVENOUS | Status: AC
  Administered 2013-04-18: 0.4 mg via INTRAVENOUS

## 2013-04-18 MED ORDER — TECHNETIUM TC 99M SESTAMIBI GENERIC - CARDIOLITE
10.8000 | Freq: Once | INTRAVENOUS | Status: AC | PRN
  Administered 2013-04-18: 11 via INTRAVENOUS

## 2013-04-18 NOTE — Progress Notes (Signed)
[name] [place],  [address] [phone]    Cardiology Nuclear Med Study  Jacob [name] is a 80 y.o. male     MRN : [id number]     DOB: 1933-05-16  Procedure Date: 04/18/2013  Nuclear Med Background Indication for Stress Test:  Evaluation for Ischemia and Abnormal EKG History:  2007 MPI: NL EF: 73% '10 ECHO: EF: 55-60% Cardiac Risk Factors: Family History - CAD, History of Smoking and Lipids  Symptoms:  Dizziness, DOE and Palpitations   Nuclear Pre-Procedure Caffeine/Decaff Intake:  None > 12 hrs NPO After: 8:30pm   Lungs:  clear O2 Sat: 97% on room air. IV 0.9% NS with Angio Cath:  22g  IV Site: R Antecubital x 1, tolerated well IV Started by:  Irven Baltimore, RN  Chest Size (in):  46 Cup Size: n/a  Height: 5\' 7"  (1.702 m)  Weight:  223 lb (101.152 kg)  BMI:  Body mass index is 34.92 kg/(m^2). Tech Comments:  N/A    Nuclear Med Study 1 or 2 day study: 1 day  Stress Test Type:  Treadmill/Lexiscan  Reading MD: N/A  Order Authorizing Provider:  [name], MD  Resting Radionuclide: Technetium 13m Sestamibi  Resting Radionuclide Dose: 11.0 mCi   Stress Radionuclide:  Technetium 75m Sestamibi  Stress Radionuclide Dose: 33.0 mCi           Stress Protocol Rest HR: 41 Stress HR: 81  Rest BP: 125/56 Stress BP: 148/65  Exercise Time (min): n/a METS: n/a   Predicted Max HR: 143 bpm % Max HR: 56.64 bpm Rate Pressure Product: 11988   Dose of Adenosine (mg):  n/a Dose of Lexiscan: 0.4 mg  Dose of Atropine (mg): n/a Dose of Dobutamine: n/a mcg/kg/min (at max HR)  Stress Test Technologist: Perrin Maltese, EMT-P  Nuclear Technologist:  Charlton Amor, CNMT     Rest Procedure:  Myocardial perfusion imaging was performed at rest 45 minutes following the intravenous administration of Technetium 38m Sestamibi. Rest ECG: SR, 1. AVB, LAFB  Stress Procedure:  The patient received IV Lexiscan 0.4 mg over 15-seconds with  concurrent low level exercise and then Technetium 6m Sestamibi was injected at 30-seconds while the patient continued walking one more minute. This patient had sob and was dizzy with the Lexiscan injection.  Quantitative spect images were obtained after a 45-minute delay. Stress ECG: No significant change from baseline ECG  QPS Raw Data Images:  There is interference from nuclear activity from structures below the diaphragm. This does not affect the ability to read the study. Stress Images:  There is small area moderate severity perfusion defect in the  mid and apical inferior walls. Rest Images:  Normal homogeneous uptake in all areas of the myocardium. Subtraction (SDS):  Small area moderate severity reversible defect in the mid and distal inferior wall.  Transient Ischemic Dilatation (Normal <1.22):  1.03 Lung/Heart Ratio (Normal <0.45):  0.23  Quantitative Gated Spect Images QGS EDV:  163 ml QGS ESV:  61 ml  Impression Exercise Capacity:  Lexiscan with low level exercise. BP Response:  Normal blood pressure response. Clinical Symptoms:  There is dyspnea. ECG Impression:  No significant ST segment change suggestive of ischemia. Comparison with Prior Nuclear Study: There is change when compared to the study report from 2007 that was normal.   Overall Impression:  Intermediate risk stress nuclear study with small moderately severe reversible defect in PDA territory. .  LV Ejection Fraction: 62%.  LV Wall Motion:  NL LV Function; NL Wall Motion  Ena Dawley, Lemmie Evens 04/18/2013

## 2013-04-18 NOTE — Telephone Encounter (Signed)
s.w. pt and r/s time of appt on 2.25.Marland KitchenMarland KitchenMarland Kitchenpt ok and aware

## 2013-04-24 ENCOUNTER — Ambulatory Visit: Payer: 59 | Admitting: Cardiology

## 2013-04-25 ENCOUNTER — Encounter: Payer: Self-pay | Admitting: Internal Medicine

## 2013-05-03 ENCOUNTER — Emergency Department (HOSPITAL_BASED_OUTPATIENT_CLINIC_OR_DEPARTMENT_OTHER): Payer: Medicare Other

## 2013-05-03 ENCOUNTER — Emergency Department (HOSPITAL_BASED_OUTPATIENT_CLINIC_OR_DEPARTMENT_OTHER)
Admission: EM | Admit: 2013-05-03 | Discharge: 2013-05-03 | Disposition: A | Discharge status: Home / Self Care | Payer: Medicare Other | Attending: Emergency Medicine | Admitting: Emergency Medicine

## 2013-05-03 ENCOUNTER — Encounter (HOSPITAL_BASED_OUTPATIENT_CLINIC_OR_DEPARTMENT_OTHER): Payer: Self-pay | Admitting: Emergency Medicine

## 2013-05-03 DIAGNOSIS — Z87442 Personal history of urinary calculi: Secondary | ICD-10-CM | POA: Insufficient documentation

## 2013-05-03 DIAGNOSIS — Z87891 Personal history of nicotine dependence: Secondary | ICD-10-CM | POA: Insufficient documentation

## 2013-05-03 DIAGNOSIS — K219 Gastro-esophageal reflux disease without esophagitis: Secondary | ICD-10-CM | POA: Insufficient documentation

## 2013-05-03 DIAGNOSIS — Z79899 Other long term (current) drug therapy: Secondary | ICD-10-CM | POA: Insufficient documentation

## 2013-05-03 DIAGNOSIS — M109 Gout, unspecified: Secondary | ICD-10-CM | POA: Insufficient documentation

## 2013-05-03 DIAGNOSIS — Z8711 Personal history of peptic ulcer disease: Secondary | ICD-10-CM | POA: Insufficient documentation

## 2013-05-03 DIAGNOSIS — N400 Enlarged prostate without lower urinary tract symptoms: Secondary | ICD-10-CM | POA: Insufficient documentation

## 2013-05-03 DIAGNOSIS — Z9889 Other specified postprocedural states: Secondary | ICD-10-CM | POA: Insufficient documentation

## 2013-05-03 DIAGNOSIS — R110 Nausea: Secondary | ICD-10-CM | POA: Insufficient documentation

## 2013-05-03 DIAGNOSIS — D649 Anemia, unspecified: Secondary | ICD-10-CM | POA: Insufficient documentation

## 2013-05-03 DIAGNOSIS — R109 Unspecified abdominal pain: Secondary | ICD-10-CM

## 2013-05-03 DIAGNOSIS — K5900 Constipation, unspecified: Secondary | ICD-10-CM | POA: Insufficient documentation

## 2013-05-03 DIAGNOSIS — Z7982 Long term (current) use of aspirin: Secondary | ICD-10-CM | POA: Insufficient documentation

## 2013-05-03 DIAGNOSIS — Z8679 Personal history of other diseases of the circulatory system: Secondary | ICD-10-CM | POA: Insufficient documentation

## 2013-05-03 DIAGNOSIS — M1990 Unspecified osteoarthritis, unspecified site: Secondary | ICD-10-CM | POA: Insufficient documentation

## 2013-05-03 DIAGNOSIS — K824 Cholesterolosis of gallbladder: Secondary | ICD-10-CM | POA: Insufficient documentation

## 2013-05-03 DIAGNOSIS — Z85828 Personal history of other malignant neoplasm of skin: Secondary | ICD-10-CM | POA: Insufficient documentation

## 2013-05-03 DIAGNOSIS — Z8669 Personal history of other diseases of the nervous system and sense organs: Secondary | ICD-10-CM | POA: Insufficient documentation

## 2013-05-03 LAB — CBC WITH DIFFERENTIAL/PLATELET
Band Neutrophils: 0 % (ref 0–10)
Basophils Absolute: 0.1 10*3/uL (ref 0.0–0.1)
Basophils Relative: 1 % (ref 0–1)
Blasts: 0 %
Eosinophils Absolute: 0 10*3/uL (ref 0.0–0.7)
Eosinophils Relative: 0 % (ref 0–5)
HCT: 34.5 % — ABNORMAL LOW (ref 39.0–52.0)
Hemoglobin: 11.7 g/dL — ABNORMAL LOW (ref 13.0–17.0)
LYMPHS PCT: 22 % (ref 12–46)
Lymphs Abs: 1.2 10*3/uL (ref 0.7–4.0)
MCH: 20.3 pg — ABNORMAL LOW (ref 26.0–34.0)
MCHC: 33.9 g/dL (ref 30.0–36.0)
MCV: 59.8 fL — ABNORMAL LOW (ref 78.0–100.0)
MONO ABS: 0.6 10*3/uL (ref 0.1–1.0)
MONOS PCT: 11 % (ref 3–12)
Metamyelocytes Relative: 0 %
Myelocytes: 0 %
NRBC: 0 /100{WBCs}
Neutro Abs: 3.7 10*3/uL (ref 1.7–7.7)
Neutrophils Relative %: 66 % (ref 43–77)
PLATELETS: 176 10*3/uL (ref 150–400)
PROMYELOCYTES ABS: 0 %
RBC: 5.77 MIL/uL (ref 4.22–5.81)
RDW: 19 % — ABNORMAL HIGH (ref 11.5–15.5)
WBC: 5.6 10*3/uL (ref 4.0–10.5)

## 2013-05-03 LAB — COMPREHENSIVE METABOLIC PANEL
ALT: 20 U/L (ref 0–53)
AST: 26 U/L (ref 0–37)
Albumin: 4.6 g/dL (ref 3.5–5.2)
Alkaline Phosphatase: 70 U/L (ref 39–117)
BILIRUBIN TOTAL: 1.9 mg/dL — AB (ref 0.3–1.2)
BUN: 18 mg/dL (ref 6–23)
CO2: 26 meq/L (ref 19–32)
CREATININE: 0.9 mg/dL (ref 0.50–1.35)
Calcium: 10.2 mg/dL (ref 8.4–10.5)
Chloride: 102 mEq/L (ref 96–112)
GFR calc Af Amer: >90 mL/min (ref >90)
GFR, EST NON AFRICAN AMERICAN: 80 mL/min — AB (ref >90)
Glucose, Bld: 111 mg/dL — ABNORMAL HIGH (ref 70–99)
Potassium: 4.3 mEq/L (ref 3.7–5.3)
Sodium: 142 mEq/L (ref 137–147)
Total Protein: 7.5 g/dL (ref 6.0–8.3)

## 2013-05-03 LAB — URINALYSIS, ROUTINE W REFLEX MICROSCOPIC
BILIRUBIN URINE: NEGATIVE
GLUCOSE, UA: NEGATIVE mg/dL
HGB URINE DIPSTICK: NEGATIVE
KETONES UR: 15 mg/dL — AB
Leukocytes, UA: NEGATIVE
NITRITE: NEGATIVE
PH: 6 (ref 5.0–8.0)
Protein, ur: NEGATIVE mg/dL
SPECIFIC GRAVITY, URINE: 1.027 (ref 1.005–1.030)
Urobilinogen, UA: 1 mg/dL (ref 0.0–1.0)

## 2013-05-03 LAB — LIPASE, BLOOD: Lipase: 29 U/L (ref 11–59)

## 2013-05-03 LAB — TROPONIN I: Troponin I: <0.3 ng/mL (ref <0.30)

## 2013-05-03 MED ORDER — SODIUM CHLORIDE 0.9 % IV SOLN
Freq: Once | INTRAVENOUS | Status: AC
  Administered 2013-05-03: 13:00:00 via INTRAVENOUS

## 2013-05-03 MED ORDER — IOHEXOL 300 MG/ML  SOLN
100.0000 mL | Freq: Once | INTRAMUSCULAR | Status: AC | PRN
  Administered 2013-05-03: 100 mL via INTRAVENOUS

## 2013-05-03 MED ORDER — IOHEXOL 300 MG/ML  SOLN
50.0000 mL | Freq: Once | INTRAMUSCULAR | Status: AC | PRN
  Administered 2013-05-03: 50 mL via ORAL

## 2013-05-03 MED ORDER — ONDANSETRON HCL 4 MG/2ML IJ SOLN: 4.0000 mg | Freq: Once | INTRAMUSCULAR | Status: DC

## 2013-05-03 MED ORDER — POLYETHYLENE GLYCOL 3350 17 G PO PACK: 17.0000 g | PACK | Freq: Every day | ORAL | Status: DC

## 2013-05-03 NOTE — ED Notes (Signed)
Pt is drinking PO contrast.  CT tech informed pt will do test.  Daughter remains at bedside.

## 2013-05-03 NOTE — ED Notes (Signed)
Pt c/o feeling bloated and "gassy". Pt sts he took gas-x this morning and tried to pass gas but sts he cannot. Pt sts he had bowel movement yesterday.

## 2013-05-03 NOTE — ED Provider Notes (Signed)
CSN: [id number]     Arrival date & time 05/03/13  1113 History   First MD Initiated Contact with Patient 05/03/13 1250     Chief Complaint  Patient presents with  . Bloated     (Consider location/radiation/quality/duration/timing/severity/associated sxs/prior Treatment) HPI Comments: Patient awoke this morning feeling "bloated and gassy". He tried to walk around the house and right leg to relieve the gas by belching. About 3 hours ago at 10:30 AM he developed abdominal pain it radiates to his back on the right side. He states he had this pain in 1987 with a kidney stone. He denies any vomiting but has felt nauseated. No chest pain or shortness of breath. No change in bowel habits had 3 bowel movements yesterday. He is still passing gas today. He has a previous history of ulcer repair remotely. Denies any cardiac history. He took Gas-X for the pain without relief. The pain in his epigastric area and right upper quadrant and radiates to his right flank. There is no testicular pain or urinary symptoms.  The history is provided by the patient.    Past Medical History  Diagnosis Date  . Benign prostatic hypertrophy     hx of  . Other and unspecified hyperlipidemia   . Personal history of unspecified disorders of nervous system and sense organs   . Mitral valve disorders   . Unspecified sleep apnea   . Morbid obesity   . Personal history of urinary calculi   . Diverticulosis of colon (without mention of hemorrhage)   . Other thalassemia   . First degree atrioventricular block   . Gout   . Osteoarthritis   . Basal cell carcinoma   . Anemia   . GERD (gastroesophageal reflux disease)   . Peptic ulcer with hemorrhage     duodenum   Past Surgical History  Procedure Laterality Date  . Tumor removed      from finger- left index  . Moles removed    . Ankle surgery      ORIF right '78  . Cataract extraction, bilateral      hx of  . Gastrectomy      hx of '56. Transfused 7 pints blood  '55, 4 pints '56   Family History  Problem Relation Age of Onset  . Coronary artery disease Father   . Heart attack Father   . Gout Father   . Cancer Daughter     skin   History  Substance Use Topics  . Smoking status: Former Smoker    Start date: 01/21/1970  . Smokeless tobacco: Never Used     Comment: quite in 1971  . Alcohol Use: No    Review of Systems  Constitutional: Negative for fever, activity change and appetite change.  HENT: Negative for voice change.   Eyes: Negative for visual disturbance.  Respiratory: Negative for cough, chest tightness and shortness of breath.   Cardiovascular: Negative for chest pain.  Gastrointestinal: Positive for nausea and abdominal pain. Negative for vomiting and diarrhea.  Genitourinary: Negative for dysuria, urgency and hematuria.  Musculoskeletal: Negative for back pain.  Skin: Negative for rash.  Neurological: Negative for dizziness, weakness and headaches.  A complete 10 system review of systems was obtained and all systems are negative except as noted in the HPI and PMH.      Allergies  Prednisone and Sulfonamide derivatives  Home Medications   Current Outpatient Rx  Name  Route  Sig  Dispense  Refill  . acetaminophen (  TYLENOL) 500 MG tablet   Oral   Take 500 mg by mouth every 6 (six) hours as needed for pain (take 2 tablets prn).         Marland Kitchen allopurinol (ZYLOPRIM) 300 MG tablet   Oral   Take 1 tablet (300 mg total) by mouth daily.   90 tablet   3   . aspirin 81 MG tablet   Oral   Take 81 mg by mouth daily.           Marland Kitchen b complex vitamins capsule   Oral   Take 1 capsule by mouth daily.         . Cholecalciferol (VITAMIN D3) 2000 UNITS capsule   Oral   Take 1 capsule (2,000 Units total) by mouth daily.         . Coenzyme Q-10 100 MG capsule   Oral   Take 300 mg by mouth daily.          . colchicine 0.6 MG tablet      Take  2 tab , then take 1 tab within 2hrs and repeat the next day if pain  persist         . Cyanocobalamin (VITAMIN B 12 PO)   Oral   Take by mouth.           . diphenhydramine-acetaminophen (TYLENOL PM) 25-500 MG TABS   Oral   Take 2 tablets by mouth at bedtime as needed.   14 tablet      . FeFum-FePoly-FA-B Cmp-C-Biot (INTEGRA PLUS) CAPS   Oral   Take 1 capsule by mouth every morning.   30 capsule   2   . ibuprofen (ADVIL,MOTRIN) 200 MG tablet   Oral   Take 200 mg by mouth every 6 (six) hours as needed for pain.         Marland Kitchen loratadine (CLARITIN) 10 MG tablet   Oral   Take 10 mg by mouth daily.         . Multiple Vitamins-Minerals (CENTRUM SILVER ULTRA MENS PO)   Oral   Take 1 capsule by mouth daily.         . Omega-3 Fatty Acids (FISH OIL) 600 MG CAPS   Oral   Take by mouth.         Marland Kitchen omeprazole (PRILOSEC) 20 MG capsule   Oral   Take 20 mg by mouth daily as needed.          . polyethylene glycol (MIRALAX) packet   Oral   Take 17 g by mouth daily.   14 each   0   . saw palmetto 160 MG capsule      160 mg. Take 2 tabs  daily          BP 127/57  Pulse 58  Temp(Src) 97.8 F (36.6 C) (Oral)  Resp 16  SpO2 98% Physical Exam  Constitutional: He is oriented to person, place, and time. He appears well-developed and well-nourished. No distress.  HENT:  Head: Normocephalic and atraumatic.  Mouth/Throat: Oropharynx is clear and moist. No oropharyngeal exudate.  Eyes: Conjunctivae and EOM are normal. Pupils are equal, round, and reactive to light.  Neck: Normal range of motion. Neck supple.  Cardiovascular: Normal rate, regular rhythm, normal heart sounds and intact distal pulses.   No murmur heard. Pulmonary/Chest: Effort normal and breath sounds normal. No respiratory distress.  Abdominal: Soft. There is tenderness. There is guarding. There is no rebound.  TTP RUQ with guarding  Musculoskeletal: Normal range of motion. He exhibits no edema and no tenderness.  No CVAT  Neurological: He is oriented to person, place,  and time. No cranial nerve deficit. He exhibits normal muscle tone. Coordination normal.  Skin: Skin is warm.    ED Course  Procedures (including critical care time) Labs Review Labs Reviewed  CBC WITH DIFFERENTIAL - Abnormal; Notable for the following:    Hemoglobin 11.7 (*)    HCT 34.5 (*)    MCV 59.8 (*)    MCH 20.3 (*)    RDW 19.0 (*)    All other components within normal limits  COMPREHENSIVE METABOLIC PANEL - Abnormal; Notable for the following:    Glucose, Bld 111 (*)    Total Bilirubin 1.9 (*)    GFR calc non Af Amer 80 (*)    All other components within normal limits  URINALYSIS, ROUTINE W REFLEX MICROSCOPIC - Abnormal; Notable for the following:    Ketones, ur 15 (*)    All other components within normal limits  LIPASE, BLOOD  TROPONIN I  TROPONIN I   Imaging Review US Abdomen Complete  05/03/2013   CLINICAL DATA:  Right upper quadrant pain.  Nausea.  EXAM: ULTRASOUND ABDOMEN COMPLETE  COMPARISON:  09/20/2009 abdominal ultrasound. 01/11/2012 abdominal CT.  FINDINGS: Gallbladder:  5 mm non mobile echogenic focus arising from the gallbladder wall is unchanged and may represent a small polyp. No gallstones are identified. Gallbladder wall measures up to 3 mm in thickness. Sonographic Murphy's sign is negative.  Common bile duct:  Diameter: 4 mm  Liver:  Slightly echogenic echotexture diffusely without focal lesion seen.  IVC:  Poorly visualized.  Pancreas:  Not visualized due to bowel gas.  Spleen:  Size and appearance within normal limits.  Right Kidney:  Length: 12.0 cm. Echogenicity within normal limits. No mass or hydronephrosis visualized.  Left Kidney:  Length: 13.0 cm. Suboptimally visualized without hydronephrosis. 2.2 x 2.1 x 2.1 cm hypoechoic mass superior to the left kidney likely corresponds to the adrenal mass described on prior CT.  Abdominal aorta:  No aneurysm visualized.  Other findings:  None.  IMPRESSION: 1. Unchanged 5 mm gallbladder polyp. No gallstones or  biliary dilatation. 2. Slightly echogenic liver, which is nonspecific but can be seen in the setting of steatosis.   Electronically Signed   By: Logan Bores   On: 05/03/2013 15:11   Ct Abdomen Pelvis W Contrast  05/03/2013   CLINICAL DATA:  Abdominal bloating, history of gastrectomy and diverticulosis and urinary tract stones  EXAM: CT ABDOMEN AND PELVIS WITH CONTRAST  TECHNIQUE: Multidetector CT imaging of the abdomen and pelvis was performed using the standard protocol following bolus administration of intravenous contrast.  CONTRAST:  119mL OMNIPAQUE IOHEXOL 300 MG/ML SOLN intravenously. The patient also received oral contrast material.  COMPARISON:  US ABDOMEN COMPLETE dated 05/03/2013; CT ABD/PELV WO CM dated 01/11/2012  FINDINGS: The patient has undergone partial gastrectomy and drainage procedure into the jejunum. There is a small amount of retrograde filling of the duodenum with contrast. There is no evidence of a small or large bowel obstruction. There is a moderate stool burden within the colon. There is sigmoid diverticulosis without evidence of acute diverticulitis. There are scattered diverticula elsewhere within the colon. A normal-appearing appendix is demonstrated on images 46 through 53.  The liver exhibits no focal mass nor ductal dilation. The gallbladder exhibits no calcified stones or surrounding inflammatory change. The pancreas demonstrates diffuse fatty infiltration but no focal mass  or inflammatory change. The spleen, right adrenal gland, and kidneys exhibit no acute abnormalities. There is a left adrenal mass which measures 2 cm in short axis and appears stable. It exhibits Hounsfield measurement of +33. Along the course of the ureters no calcified stones are demonstrated. The urinary bladder is partially distended and grossly normal. The prostate gland is mildly enlarged and produces an impression upon the urinary bladder base.  There are shallow bilateral inguinal hernias. There is a  small fat-containing umbilical hernia. There is no evidence of ascites. The psoas musculature is normal in appearance. The lumbar vertebral bodies are preserved in height. The bony pelvis exhibits no acute abnormalities. The lung bases are clear.  IMPRESSION: 1. There is no evidence of a small or large bowel obstruction nor ileus. The appearance of the gastric remnant and anastomosis is normal. A moderately increased stool burden is present within the colon. There is no evidence of acute diverticulitis. 2. There is no evidence of acute urinary tract obstruction or calcified urinary tract stones. 3. No acute hepatobiliary abnormality is demonstrated. 4. There is stable appearance of the left adrenal mass.   Electronically Signed   By: David  Martinique   On: 05/03/2013 16:10   Dg Abd Acute W/chest  05/03/2013   CLINICAL DATA:  Right-sided abdominal discomfort with five-month history of weight loss  EXAM: ACUTE ABDOMEN SERIES (ABDOMEN 2 VIEW & CHEST 1 VIEW)  COMPARISON:  None.  FINDINGS: The lungs are borderline hypoinflated. There is no focal infiltrate. There is no pleural effusion. The cardiac silhouette is normal in size. The pulmonary vascularity is not engorged.  Within the abdomen the bowel gas pattern is nonspecific. There is a moderate stool burden present within the colon. There are loops of moderately distended gas-filled small bowel throughout the abdomen . No free extraluminal gas collections are demonstrated. There are no abnormal soft tissue calcifications. There are mild degenerative changes of the lumbar discs.  IMPRESSION: 1. The bowel gas pattern is nonspecific. A moderately increased stool burden is noted throughout the colon. Proximal to this mild distention of the small bowel with gas is demonstrated. This could be related to enteritis or gastroenteritis or low level partial small bowel obstruction. 2. There is no evidence of active cardiopulmonary disease.   Electronically Signed   By: David   Martinique   On: 05/03/2013 14:15    EKG Interpretation    Date/Time:  Wednesday May 03 2013 13:21:47 EST Ventricular Rate:  50 PR Interval:  448 QRS Duration: 110 QT Interval:  450 QTC Calculation: 410 R Axis:   -40 Text Interpretation:  Sinus bradycardia with 1st degree A-V block Left axis deviation Abnormal ECG prolonged PR Confirmed by Wyvonnia Dusky  MD, Emer Onnen ([id number]) on 05/03/2013 1:32:39 PM            MDM   Final diagnoses:  Abdominal pain  Gallbladder polyp  Constipation   Abdominal fullness and gas since this morning.  No vomiting or chest pain.  Vitals stable. EKG with significant prolonged PR, unchanged. Patient with intermediate risk stress test 2 weeks ago.  Was done for palpitations, not chest pain. Denies chest pain today, has RUQ and epigastric pain.  Atypical for ACS. EKG unchanged. TTP RUQ with guarding, will evaluate gallbladder. Gallbladder polyp on [place] without stones. T bili 1.9.  LFTs normal, lipase normal. CT with no biliary pathology. No obstruction.  Shows constipation.  Pain and discomfort resolved in ED. Troponin negative x2. He is feeling better and anxious to  go home. Suspect pain possibly related to constipation.  Ezequiel Essex, MD 05/03/13 2134

## 2013-05-03 NOTE — ED Notes (Signed)
Pt is unable to void.  He declines pain and nausea medication.

## 2013-05-03 NOTE — ED Notes (Signed)
Patient transported to X-ray 

## 2013-05-03 NOTE — Discharge Instructions (Signed)
Abdominal Pain, Adult Follow up with your doctor this week. Return to the ED if you develop new or worsening symptoms. Many things can cause abdominal pain. Usually, abdominal pain is not caused by a disease and will improve without treatment. It can often be observed and treated at home. Your health care provider will do a physical exam and possibly order blood tests and X-rays to help determine the seriousness of your pain. However, in many cases, more time must pass before a clear cause of the pain can be found. Before that point, your health care provider may not know if you need more testing or further treatment. HOME CARE INSTRUCTIONS  Monitor your abdominal pain for any changes. The following actions may help to alleviate any discomfort you are experiencing:  Only take over-the-counter or prescription medicines as directed by your health care provider.  Do not take laxatives unless directed to do so by your health care provider.  Try a clear liquid diet (broth, tea, or water) as directed by your health care provider. Slowly move to a bland diet as tolerated. SEEK MEDICAL CARE IF:  You have unexplained abdominal pain.  You have abdominal pain associated with nausea or diarrhea.  You have pain when you urinate or have a bowel movement.  You experience abdominal pain that wakes you in the night.  You have abdominal pain that is worsened or improved by eating food.  You have abdominal pain that is worsened with eating fatty foods. SEEK IMMEDIATE MEDICAL CARE IF:   Your pain does not go away within 2 hours.  You have a fever.  You keep throwing up (vomiting).  Your pain is felt only in portions of the abdomen, such as the right side or the left lower portion of the abdomen.  You pass bloody or black tarry stools. MAKE SURE YOU:  Understand these instructions.   Will watch your condition.   Will get help right away if you are not doing well or get worse.  Document  Released: 12/10/2004 Document Revised: 12/21/2012 Document Reviewed: 11/09/2012 [facility] Patient Information 2014 [place].

## 2013-05-04 ENCOUNTER — Encounter: Payer: Self-pay | Admitting: Internal Medicine

## 2013-05-04 MED ORDER — ALLOPURINOL 300 MG PO TABS: 300.0000 mg | ORAL_TABLET | Freq: Every day | ORAL | Status: DC

## 2013-05-07 ENCOUNTER — Encounter: Payer: Self-pay | Admitting: Internal Medicine

## 2013-05-08 MED ORDER — COLCHICINE 0.6 MG PO TABS: 0.6000 mg | ORAL_TABLET | ORAL | Status: DC

## 2013-05-09 ENCOUNTER — Encounter: Payer: Self-pay | Admitting: Internal Medicine

## 2013-05-09 NOTE — Telephone Encounter (Signed)
sw pt spouse gv appt for labs for 06/05/13@ 2:30pm. Pt spouse is aware...td

## 2013-05-10 ENCOUNTER — Telehealth: Payer: Self-pay

## 2013-05-10 NOTE — Telephone Encounter (Signed)
Received a fax from [facility] approving Colchicine tablets. Valid 04/09/13 until 05/09/14

## 2013-05-31 ENCOUNTER — Other Ambulatory Visit: Payer: 59

## 2013-06-02 ENCOUNTER — Telehealth: Payer: Self-pay | Admitting: Internal Medicine

## 2013-06-02 NOTE — Telephone Encounter (Signed)
pt called to delay appts....done...pt aware of new d.t

## 2013-06-05 ENCOUNTER — Other Ambulatory Visit: Payer: 59

## 2013-06-05 ENCOUNTER — Other Ambulatory Visit (HOSPITAL_COMMUNITY): Payer: Self-pay | Admitting: Urology

## 2013-06-05 DIAGNOSIS — R972 Elevated prostate specific antigen [PSA]: Secondary | ICD-10-CM

## 2013-06-07 ENCOUNTER — Ambulatory Visit: Payer: 59 | Admitting: Internal Medicine

## 2013-06-08 ENCOUNTER — Ambulatory Visit: Payer: 59 | Admitting: Cardiology

## 2013-06-30 ENCOUNTER — Ambulatory Visit (AMBULATORY_SURGERY_CENTER): Payer: Self-pay | Admitting: *Deleted

## 2013-06-30 VITALS — Ht 67.0 in | Wt 207.6 lb

## 2013-06-30 DIAGNOSIS — Z86010 Personal history of colonic polyps: Secondary | ICD-10-CM

## 2013-06-30 MED ORDER — MOVIPREP 100 G PO SOLR: ORAL | Status: DC

## 2013-06-30 NOTE — Progress Notes (Signed)
No allergies to eggs or soy. No problems with anesthesia.  Pt given Emmi instructions for colonoscopy  No oxygen use  No diet drug use  

## 2013-07-01 ENCOUNTER — Encounter: Payer: Self-pay | Admitting: Internal Medicine

## 2013-07-05 ENCOUNTER — Encounter: Payer: Self-pay | Admitting: Internal Medicine

## 2013-07-07 ENCOUNTER — Other Ambulatory Visit (HOSPITAL_COMMUNITY): Payer: Medicare Other

## 2013-07-10 ENCOUNTER — Other Ambulatory Visit: Payer: Self-pay | Admitting: Medical Oncology

## 2013-07-10 ENCOUNTER — Other Ambulatory Visit (HOSPITAL_BASED_OUTPATIENT_CLINIC_OR_DEPARTMENT_OTHER): Payer: Medicare Other

## 2013-07-10 ENCOUNTER — Telehealth: Payer: Self-pay | Admitting: Medical Oncology

## 2013-07-10 DIAGNOSIS — D568 Other thalassemias: Secondary | ICD-10-CM

## 2013-07-10 DIAGNOSIS — D509 Iron deficiency anemia, unspecified: Secondary | ICD-10-CM

## 2013-07-10 LAB — CBC WITH DIFFERENTIAL/PLATELET
BASO%: 0.6 % (ref 0.0–2.0)
BASOS ABS: 0 10*3/uL (ref 0.0–0.1)
EOS%: 2.2 % (ref 0.0–7.0)
Eosinophils Absolute: 0.1 10*3/uL (ref 0.0–0.5)
HEMATOCRIT: 35.9 % — AB (ref 38.4–49.9)
HGB: 11.8 g/dL — ABNORMAL LOW (ref 13.0–17.1)
LYMPH%: 34.9 % (ref 14.0–49.0)
MCH: 20.6 pg — ABNORMAL LOW (ref 27.2–33.4)
MCHC: 32.9 g/dL (ref 32.0–36.0)
MCV: 62.8 fL — AB (ref 79.3–98.0)
MONO#: 0.5 10*3/uL (ref 0.1–0.9)
MONO%: 10.2 % (ref 0.0–14.0)
NEUT%: 52.1 % (ref 39.0–75.0)
NEUTROS ABS: 2.6 10*3/uL (ref 1.5–6.5)
NRBC: 0 % (ref 0–0)
Platelets: 167 10*3/uL (ref 140–400)
RBC: 5.72 10*6/uL (ref 4.20–5.82)
RDW: 15.8 % — ABNORMAL HIGH (ref 11.0–14.6)
WBC: 5 10*3/uL (ref 4.0–10.3)
lymph#: 1.7 10*3/uL (ref 0.9–3.3)

## 2013-07-10 NOTE — Telephone Encounter (Signed)
They will redraw iron studies this week.

## 2013-07-11 ENCOUNTER — Ambulatory Visit (AMBULATORY_SURGERY_CENTER): Payer: Medicare Other | Admitting: Internal Medicine

## 2013-07-11 ENCOUNTER — Encounter: Payer: Self-pay | Admitting: Internal Medicine

## 2013-07-11 VITALS — BP 124/62 | HR 45 | Temp 96.6°F | Resp 16 | Ht 67.0 in | Wt 207.0 lb

## 2013-07-11 DIAGNOSIS — Z86010 Personal history of colonic polyps: Secondary | ICD-10-CM

## 2013-07-11 MED ORDER — SODIUM CHLORIDE 0.9 % IV SOLN: 500.0000 mL | INTRAVENOUS | Status: DC

## 2013-07-11 NOTE — Progress Notes (Signed)
Lidocaine-40mg IV prior to Propofol InductionPropofol given over incremental dosages 

## 2013-07-11 NOTE — Op Note (Signed)
[facility]  [address] [place], [address]   COLONOSCOPY PROCEDURE REPORT  PATIENT: Jacob, [name]  MR#: [id number] BIRTHDATE: 1933/06/28 , 79  yrs. old GENDER: Male ENDOSCOPIST: Eustace Quail, MD REFERRED MP:[id number] Program Recall PROCEDURE DATE:  07/11/2013 PROCEDURE:   Colonoscopy, surveillance First Screening Colonoscopy - Avg.  risk and is 50 yrs.  old or older - No.  Prior Negative Screening - Now for repeat screening. N/A  History of Adenoma - Now for follow-up colonoscopy & has been > or = to 3 yrs.  Yes hx of adenoma.  Has been 3 or more years since last colonoscopy.  Polyps Removed Today? No.  Recommend repeat exam, <10 yrs? No. ASA CLASS:   Class II INDICATIONS:Patient's personal history of adenomatous colon polyps. Previous exams with tubular adenomas 2003, 2006, 2010. MEDICATIONS: MAC sedation, administered by CRNA and propofol (Diprivan) 100mg  IV  DESCRIPTION OF PROCEDURE:   After the risks benefits and alternatives of the procedure were thoroughly explained, informed consent was obtained.  A digital rectal exam revealed no abnormalities of the rectum.   The LB [id number]  endoscope was introduced through the anus and advanced to the cecum, which was identified by both the appendix and ileocecal valve. No adverse events experienced.   The quality of the prep was excellent, using MoviPrep  The instrument was then slowly withdrawn as the colon was fully examined.   COLON FINDINGS: Severe diverticulosis was noted  in the left colon with moderate ascending diverticulosis.   The colon mucosa was otherwise normal. No polyps or cancer seen.  Retroflexed views revealed internal hemorrhoids. The time to cecum=3 minutes 35 seconds.  Withdrawal time=7 minutes 50 seconds.  The scope was withdrawn and the procedure completed.  COMPLICATIONS: There were no complications.  ENDOSCOPIC IMPRESSION: 1.   Severe diverticulosis was noted  in the left colon with moderate right-sided diverticulosis 2.   The colon mucosa was otherwise normal . No polyps or cancer  RECOMMENDATIONS: 1. Return to the care of your primary provider.  GI follow up as needed   eSigned:  Eustace Quail, MD 07/11/2013 9:31 AM   cc: The Patient and Neena Rhymes, MD

## 2013-07-11 NOTE — Patient Instructions (Signed)
YOU HAD AN ENDOSCOPIC PROCEDURE TODAY AT [facility]: Refer to the procedure report that was given to you for any specific questions about what was found during the examination.  If the procedure report does not answer your questions, please call your gastroenterologist to clarify.  If you requested that your care partner not be given the details of your procedure findings, then the procedure report has been included in a sealed envelope for you to review at your convenience later.  YOU SHOULD EXPECT: Some feelings of bloating in the abdomen. Passage of more gas than usual.  Walking can help get rid of the air that was put into your GI tract during the procedure and reduce the bloating. If you had a lower endoscopy (such as a colonoscopy or flexible sigmoidoscopy) you may notice spotting of blood in your stool or on the toilet paper. If you underwent a bowel prep for your procedure, then you may not have a normal bowel movement for a few days.  DIET: Your first meal following the procedure should be a light meal and then it is ok to progress to your normal diet.  A half-sandwich or bowl of soup is an example of a good first meal.  Heavy or fried foods are harder to digest and may make you feel nauseous or bloated.  Likewise meals heavy in dairy and vegetables can cause extra gas to form and this can also increase the bloating.  Drink plenty of fluids but you should avoid alcoholic beverages for 24 hours.Try to eat a high fiber diet to help your Diverticulosis.  ACTIVITY: Your care partner should take you home directly after the procedure.  You should plan to take it easy, moving slowly for the rest of the day.  You can resume normal activity the day after the procedure however you should NOT DRIVE or use heavy machinery for 24 hours (because of the sedation medicines used during the test).    SYMPTOMS TO REPORT IMMEDIATELY: A gastroenterologist can be reached at any hour.  During normal  business hours, 8:30 AM to 5:00 PM Monday through Friday, call [phone].  After hours and on weekends, please call the GI answering service at [phone] who will take a message and have the physician on call contact you.   Following lower endoscopy (colonoscopy or flexible sigmoidoscopy):  Excessive amounts of blood in the stool  Significant tenderness or worsening of abdominal pains  Swelling of the abdomen that is new, acute  Fever of 100F or higher  FOLLOW UP: If any biopsies were taken you will be contacted by phone or by letter within the next 1-3 weeks.  Call your gastroenterologist if you have not heard about the biopsies in 3 weeks.  Our staff will call the home number listed on your records the next business day following your procedure to check on you and address any questions or concerns that you may have at that time regarding the information given to you following your procedure. This is a courtesy call and so if there is no answer at the home number and we have not heard from you through the emergency physician on call, we will assume that you have returned to your regular daily activities without incident.  SIGNATURES/CONFIDENTIALITY: You and/or your care partner have signed paperwork which will be entered into your electronic medical record.  These signatures attest to the fact that that the information above on your After Visit Summary has been reviewed and  is understood.  Full responsibility of the confidentiality of this discharge information lies with you and/or your care-partner.

## 2013-07-12 ENCOUNTER — Ambulatory Visit (HOSPITAL_BASED_OUTPATIENT_CLINIC_OR_DEPARTMENT_OTHER): Payer: Medicare Other | Admitting: Internal Medicine

## 2013-07-12 ENCOUNTER — Other Ambulatory Visit: Payer: Medicare Other

## 2013-07-12 ENCOUNTER — Telehealth: Payer: Self-pay | Admitting: *Deleted

## 2013-07-12 ENCOUNTER — Encounter: Payer: Self-pay | Admitting: Internal Medicine

## 2013-07-12 ENCOUNTER — Other Ambulatory Visit: Payer: Self-pay | Admitting: Internal Medicine

## 2013-07-12 VITALS — BP 138/55 | HR 46 | Resp 18 | Ht 67.0 in

## 2013-07-12 DIAGNOSIS — D561 Beta thalassemia: Secondary | ICD-10-CM

## 2013-07-12 DIAGNOSIS — D509 Iron deficiency anemia, unspecified: Secondary | ICD-10-CM

## 2013-07-12 LAB — IRON AND TIBC CHCC
%SAT: 34 % (ref 20–55)
Iron: 117 ug/dL (ref 42–163)
TIBC: 340 ug/dL (ref 202–409)
UIBC: 223 ug/dL (ref 117–376)

## 2013-07-12 LAB — FERRITIN CHCC: Ferritin: 92 ng/ml (ref 22–316)

## 2013-07-12 NOTE — Telephone Encounter (Signed)
  Follow up Call-  Call back number 07/11/2013  Post procedure Call Back phone  # [phone]  Permission to leave phone message Yes     Patient questions:  Do you have a fever, pain , or abdominal swelling? no Pain Score  0 *  Have you tolerated food without any problems? yes  Have you been able to return to your normal activities? yes  Do you have any questions about your discharge instructions: Diet   no Medications  no Follow up visit  no  Do you have questions or concerns about your Care? no  Actions: * If pain score is 4 or above: No action needed, pain <4.

## 2013-07-12 NOTE — Progress Notes (Signed)
[facility] Telephone:([phone]) [phone]   Fax:(336) [phone]  OFFICE PROGRESS NOTE  Adella Hare, MD [address] [place] [address]  DIAGNOSIS: History of beta thalassemia minor and iron deficiency anemia  PRIOR THERAPY: None  CURRENT THERAPY: Integra plus 1 capsule by mouth daily.  INTERVAL HISTORY: Jacob [name] 80 y.o. male returns to the clinic today for followup visit. The patient is feeling very well these days is no fatigue or weakness. He denied having any significant fever or chills, no nausea or vomiting. He has no chest pain, shortness of breath, cough or hemoptysis. He has no significant weight loss or night sweats. He had repeat CBC and iron study performed recently and he is here for evaluation and discussion of his lab results. He is tolerating his treatment with Integra plus fairly well.   MEDICAL HISTORY: Past Medical History  Diagnosis Date  . Benign prostatic hypertrophy     hx of  . Other and unspecified hyperlipidemia   . Personal history of unspecified disorders of nervous system and sense organs   . Mitral valve disorders   . Unspecified sleep apnea   . Morbid obesity   . Personal history of urinary calculi   . Diverticulosis of colon (without mention of hemorrhage)   . Other thalassemia   . First degree atrioventricular block   . Gout   . Osteoarthritis   . Basal cell carcinoma   . Anemia   . GERD (gastroesophageal reflux disease)   . Peptic ulcer with hemorrhage     duodenum  . Blood transfusion without reported diagnosis 1956    gastrectomy    ALLERGIES:  is allergic to prednisone and sulfonamide derivatives.  MEDICATIONS:  Current Outpatient Prescriptions  Medication Sig Dispense Refill  . acetaminophen (TYLENOL) 500 MG tablet Take 500 mg by mouth every 6 (six) hours as needed for pain (take 2 tablets prn).      Marland Kitchen allopurinol (ZYLOPRIM) 300 MG tablet Take 1 tablet (300 mg total) by mouth daily.  90 tablet  3    . aspirin 81 MG tablet Take 81 mg by mouth daily.        Marland Kitchen b complex vitamins capsule Take 1 capsule by mouth daily.      . Cetirizine HCl (ZYRTEC PO) Take by mouth daily.      . Cholecalciferol (VITAMIN D3) 2000 UNITS capsule Take 1 capsule (2,000 Units total) by mouth daily.      . Coenzyme Q-10 100 MG capsule Take 300 mg by mouth daily.       . colchicine 0.6 MG tablet Take 1 tablet (0.6 mg total) by mouth as directed. Take  2 tab , then take 1 tab within 2hrs and repeat the next day if pain persist  12 tablet  1  . Cyanocobalamin (VITAMIN B 12 PO) Take by mouth.        . FeFum-FePoly-FA-B Cmp-C-Biot (INTEGRA PLUS) CAPS Take 1 capsule by mouth every morning.  30 capsule  2  . ibuprofen (ADVIL,MOTRIN) 200 MG tablet Take 200 mg by mouth every 6 (six) hours as needed for pain.      . Multiple Vitamins-Minerals (CENTRUM SILVER ULTRA MENS PO) Take 1 capsule by mouth daily.      . Omega-3 Fatty Acids (FISH OIL) 600 MG CAPS Take by mouth.      Marland Kitchen omeprazole (PRILOSEC) 20 MG capsule Take 20 mg by mouth daily as needed.       Marland Kitchen  polyethylene glycol (MIRALAX) packet Take 17 g by mouth daily.  14 each  0  . saw palmetto 160 MG capsule 160 mg. Take 2 tabs  daily       No current facility-administered medications for this visit.    SURGICAL HISTORY:  Past Surgical History  Procedure Laterality Date  . Tumor removed Left 2002    from finger- left index  . Moles removed  2008  . Cataract extraction, bilateral  [id number]    hx of  . Gastrectomy      hx of '56. Transfused 7 pints blood '55, 4 pints '56    REVIEW OF SYSTEMS:  A comprehensive review of systems was negative.   PHYSICAL EXAMINATION: General appearance: alert, cooperative and no distress Head: Normocephalic, without obvious abnormality, atraumatic Neck: no adenopathy, no JVD, supple, symmetrical, trachea midline and thyroid not enlarged, symmetric, no tenderness/mass/nodules Lymph nodes: Cervical, supraclavicular, and axillary nodes  normal. Resp: clear to auscultation bilaterally Back: symmetric, no curvature. ROM normal. No CVA tenderness. Cardio: regular rate and rhythm, S1, S2 normal, no murmur, click, rub or gallop GI: soft, non-tender; bowel sounds normal; no masses,  no organomegaly Extremities: extremities normal, atraumatic, no cyanosis or edema  ECOG PERFORMANCE STATUS: 0 - Asymptomatic  Blood pressure 138/55, pulse 46, temperature 0 F (-17.8 C), resp. rate 18, height 5\' 7"  (1.702 m).  LABORATORY DATA: Lab Results  Component Value Date   WBC 5.0 07/10/2013   HGB 11.8* 07/10/2013   HCT 35.9* 07/10/2013   MCV 62.8* 07/10/2013   PLT 167 07/10/2013      Chemistry      Component Value Date/Time   NA 142 05/03/2013 1300   NA 141 12/12/2012 1522   K 4.3 05/03/2013 1300   K 4.2 12/12/2012 1522   CL 102 05/03/2013 1300   CO2 26 05/03/2013 1300   CO2 24 12/12/2012 1522   BUN 18 05/03/2013 1300   BUN 22.4 12/12/2012 1522   CREATININE 0.90 05/03/2013 1300   CREATININE 1.1 12/12/2012 1522      Component Value Date/Time   CALCIUM 10.2 05/03/2013 1300   CALCIUM 9.7 12/12/2012 1522   ALKPHOS 70 05/03/2013 1300   ALKPHOS 86 12/12/2012 1522   AST 26 05/03/2013 1300   AST 21 12/12/2012 1522   ALT 20 05/03/2013 1300   ALT 19 12/12/2012 1522   BILITOT 1.9* 05/03/2013 1300   BILITOT 0.96 12/12/2012 1522     Other lab results: Ferritin 92, serum iron 117, total iron binding capacity 340 and iron saturation 34%.  RADIOGRAPHIC STUDIES: No results found.  ASSESSMENT AND PLAN: This is a very pleasant 80 years old white male with beta thalassemia minor as well as iron deficiency anemia. He has mild anemia consistent with his history of thalassemia minor but his iron study has significantly improved. I discussed the lab result with the patient today. I recommended for him to continue on treatment with Integra plus 1 capsule by mouth daily. I would see him back for followup visit in 6 months with repeat CBC, iron study and  ferritin. He was advised to call immediately if he has any concerning symptoms in the interval. The patient voices understanding of current disease status and treatment options and is in agreement with the current care plan.  All questions were answered. The patient knows to call the clinic with any problems, questions or concerns. We can certainly see the patient much sooner if necessary.  Disclaimer: This note was dictated with voice  recognition software. Similar sounding words can inadvertently be transcribed and may not be corrected upon review.

## 2013-07-13 ENCOUNTER — Ambulatory Visit (HOSPITAL_COMMUNITY)
Admission: RE | Admit: 2013-07-13 | Discharge: 2013-07-13 | Disposition: A | Discharge status: Home / Self Care | Payer: Medicare Other | Source: Ambulatory Visit | Attending: Urology | Admitting: Urology

## 2013-07-13 DIAGNOSIS — R350 Frequency of micturition: Secondary | ICD-10-CM | POA: Insufficient documentation

## 2013-07-13 DIAGNOSIS — R972 Elevated prostate specific antigen [PSA]: Secondary | ICD-10-CM | POA: Insufficient documentation

## 2013-07-13 LAB — POCT I-STAT CREATININE: CREATININE: 1.1 mg/dL (ref 0.50–1.35)

## 2013-07-13 MED ORDER — GADOBENATE DIMEGLUMINE 529 MG/ML IV SOLN
19.0000 mL | Freq: Once | INTRAVENOUS | Status: AC | PRN
  Administered 2013-07-13: 19 mL via INTRAVENOUS

## 2013-07-14 ENCOUNTER — Telehealth: Payer: Self-pay | Admitting: Internal Medicine

## 2013-07-14 NOTE — Addendum Note (Signed)
Addended by: Lowry Ram on: 07/14/2013 03:24 PM   Modules accepted: Level of Service

## 2013-07-14 NOTE — Telephone Encounter (Signed)
, °

## 2013-07-25 ENCOUNTER — Encounter: Payer: Self-pay | Admitting: Cardiology

## 2013-07-25 ENCOUNTER — Ambulatory Visit (INDEPENDENT_AMBULATORY_CARE_PROVIDER_SITE_OTHER): Payer: Medicare Other | Admitting: Cardiology

## 2013-07-25 VITALS — BP 128/66 | HR 70 | Ht 67.0 in | Wt 199.4 lb

## 2013-07-25 DIAGNOSIS — R9439 Abnormal result of other cardiovascular function study: Secondary | ICD-10-CM

## 2013-07-25 NOTE — Progress Notes (Signed)
HPI The patient presents for followup of an abnormal Lexiscan Myoview.  He was to have a POET (Plain Old Exercise Treadmill) for screening purposes as he was increasing her exercise.  This did demonstrate asmall moderately severe reversible defect in PDA territory.   He has had no chest pressure, neck or arm discomfort. He has had no shortness of breath, PND or orthopnea. He has had no weight gain or edema.  He has lost weight with diet and exercise.    Allergies  Allergen Reactions  . Prednisone     REACTION: GI bleed  . Sulfonamide Derivatives     REACTION: Rash    Current Outpatient Prescriptions  Medication Sig Dispense Refill  . acetaminophen (TYLENOL) 500 MG tablet Take 500 mg by mouth every 6 (six) hours as needed for pain (take 2 tablets prn).      Marland Kitchen allopurinol (ZYLOPRIM) 300 MG tablet Take 1 tablet (300 mg total) by mouth daily.  90 tablet  3  . aspirin 81 MG tablet Take 81 mg by mouth daily.        Marland Kitchen b complex vitamins capsule Take 1 capsule by mouth daily.      . Cetirizine HCl (ZYRTEC PO) Take 1 tablet by mouth as needed. Pt alternates with Allegra 180 mg prn      . Cholecalciferol (VITAMIN D3) 2000 UNITS capsule Take 1 capsule (2,000 Units total) by mouth daily.      . Coenzyme Q-10 100 MG capsule Take 300 mg by mouth daily.       . colchicine 0.6 MG tablet Take 0.6 mg by mouth as needed.      . Cyanocobalamin (VITAMIN B 12 PO) Take by mouth.        . fexofenadine (ALLEGRA) 180 MG tablet Take 180 mg by mouth as needed for allergies or rhinitis. Alternates with Zyrtec      . ibuprofen (ADVIL,MOTRIN) 200 MG tablet Take 200 mg by mouth every 6 (six) hours as needed for pain.      . Multiple Vitamins-Minerals (CENTRUM SILVER ULTRA MENS PO) Take 1 capsule by mouth daily.      . Omega-3 Fatty Acids (FISH OIL) 600 MG CAPS Take by mouth.      . saw palmetto 160 MG capsule 160 mg. Take 2 tabs  daily       No current facility-administered medications for this visit.    Past  Medical History  Diagnosis Date  . Benign prostatic hypertrophy     hx of  . Other and unspecified hyperlipidemia   . Personal history of unspecified disorders of nervous system and sense organs   . Mitral valve disorders   . Unspecified sleep apnea   . Morbid obesity   . Personal history of urinary calculi   . Diverticulosis of colon (without mention of hemorrhage)   . Other thalassemia   . First degree atrioventricular block   . Gout   . Osteoarthritis   . Basal cell carcinoma   . Anemia   . GERD (gastroesophageal reflux disease)   . Peptic ulcer with hemorrhage     duodenum  . Blood transfusion without reported diagnosis 1956    gastrectomy    Past Surgical History  Procedure Laterality Date  . Tumor removed Left 2002    from finger- left index  . Moles removed  2008  . Cataract extraction, bilateral  [id number]    hx of  . Gastrectomy      hx of '  56. Transfused 7 pints blood '55, 4 pints '56    ROS:  As stated in the HPI and negative for all other systems.  PHYSICAL EXAM BP 128/66  Pulse 70  Ht 5\' 7"  (1.702 m)  Wt 199 lb 6.4 oz (90.447 kg)  BMI 31.22 kg/m2 GENERAL:  Well appearing NECK:  No jugular venous distention, waveform within normal limits, carotid upstroke brisk and symmetric, no bruits, no thyromegaly LUNGS:  Clear to auscultation bilaterally CHEST:  Unremarkable HEART:  PMI not displaced or sustained,S1 and S2 within normal limits, no S3, no S4, no clicks, no rubs, no murmurs ABD:  Flat, positive bowel sounds normal in frequency in pitch, no bruits, no rebound, no guarding, no midline pulsatile mass, no hepatomegaly, no splenomegaly, obese EXT:  2 plus pulses throughout, no edema, no cyanosis no clubbing   ASSESSMENT AND PLAN  ABNORMAL STRESS TEST - This was read as moderate risk with a small inferior defect. However, he did have a defect on a previous study in 2007. His EF was normal. He has absolutely no symptoms and a very active functional  level. Therefore, no further cardiovascular testing is suggested. He needs continued risk reduction.  MORBID OBESITY, HX OF -  He will continue with exercise and a healthy diet.

## 2013-07-25 NOTE — Patient Instructions (Signed)
The current medical regimen is effective;  continue present plan and medications.  Follow up in 1 year with Dr Percival Spanish at the [facility] office.  You will receive a letter in the mail 2 months before you are due.  Please call us when you receive this letter to schedule your follow up appointment.

## 2013-07-31 ENCOUNTER — Telehealth: Payer: Self-pay | Admitting: Internal Medicine

## 2013-07-31 ENCOUNTER — Emergency Department (HOSPITAL_BASED_OUTPATIENT_CLINIC_OR_DEPARTMENT_OTHER)
Admission: EM | Admit: 2013-07-31 | Discharge: 2013-07-31 | Disposition: A | Discharge status: Home / Self Care | Payer: Medicare Other | Attending: Emergency Medicine | Admitting: Emergency Medicine

## 2013-07-31 ENCOUNTER — Encounter (HOSPITAL_BASED_OUTPATIENT_CLINIC_OR_DEPARTMENT_OTHER): Payer: Self-pay | Admitting: Emergency Medicine

## 2013-07-31 DIAGNOSIS — D649 Anemia, unspecified: Secondary | ICD-10-CM | POA: Insufficient documentation

## 2013-07-31 DIAGNOSIS — Z9089 Acquired absence of other organs: Secondary | ICD-10-CM | POA: Insufficient documentation

## 2013-07-31 DIAGNOSIS — N400 Enlarged prostate without lower urinary tract symptoms: Secondary | ICD-10-CM | POA: Insufficient documentation

## 2013-07-31 DIAGNOSIS — Z8669 Personal history of other diseases of the nervous system and sense organs: Secondary | ICD-10-CM | POA: Insufficient documentation

## 2013-07-31 DIAGNOSIS — Z79899 Other long term (current) drug therapy: Secondary | ICD-10-CM | POA: Insufficient documentation

## 2013-07-31 DIAGNOSIS — R109 Unspecified abdominal pain: Secondary | ICD-10-CM

## 2013-07-31 DIAGNOSIS — Z8679 Personal history of other diseases of the circulatory system: Secondary | ICD-10-CM | POA: Insufficient documentation

## 2013-07-31 DIAGNOSIS — Z7982 Long term (current) use of aspirin: Secondary | ICD-10-CM | POA: Insufficient documentation

## 2013-07-31 DIAGNOSIS — M1990 Unspecified osteoarthritis, unspecified site: Secondary | ICD-10-CM | POA: Insufficient documentation

## 2013-07-31 DIAGNOSIS — Z87891 Personal history of nicotine dependence: Secondary | ICD-10-CM | POA: Insufficient documentation

## 2013-07-31 DIAGNOSIS — M109 Gout, unspecified: Secondary | ICD-10-CM | POA: Insufficient documentation

## 2013-07-31 DIAGNOSIS — Z87442 Personal history of urinary calculi: Secondary | ICD-10-CM | POA: Insufficient documentation

## 2013-07-31 DIAGNOSIS — R1032 Left lower quadrant pain: Secondary | ICD-10-CM | POA: Insufficient documentation

## 2013-07-31 DIAGNOSIS — K5900 Constipation, unspecified: Secondary | ICD-10-CM | POA: Insufficient documentation

## 2013-07-31 DIAGNOSIS — Z8589 Personal history of malignant neoplasm of other organs and systems: Secondary | ICD-10-CM | POA: Insufficient documentation

## 2013-07-31 LAB — CBC WITH DIFFERENTIAL/PLATELET
Basophils Absolute: 0 10*3/uL (ref 0.0–0.1)
Basophils Relative: 1 % (ref 0–1)
Eosinophils Absolute: 0.1 10*3/uL (ref 0.0–0.7)
Eosinophils Relative: 2 % (ref 0–5)
HEMATOCRIT: 33.7 % — AB (ref 39.0–52.0)
Hemoglobin: 11.6 g/dL — ABNORMAL LOW (ref 13.0–17.0)
LYMPHS PCT: 25 % (ref 12–46)
Lymphs Abs: 1.7 10*3/uL (ref 0.7–4.0)
MCH: 21.2 pg — ABNORMAL LOW (ref 26.0–34.0)
MCHC: 34.4 g/dL (ref 30.0–36.0)
MCV: 61.5 fL — AB (ref 78.0–100.0)
MONO ABS: 0.7 10*3/uL (ref 0.1–1.0)
MONOS PCT: 10 % (ref 3–12)
Neutro Abs: 4.1 10*3/uL (ref 1.7–7.7)
Neutrophils Relative %: 62 % (ref 43–77)
Platelets: 191 10*3/uL (ref 150–400)
RBC: 5.48 MIL/uL (ref 4.22–5.81)
RDW: 16.3 % — ABNORMAL HIGH (ref 11.5–15.5)
WBC: 6.6 10*3/uL (ref 4.0–10.5)

## 2013-07-31 LAB — URINALYSIS, ROUTINE W REFLEX MICROSCOPIC
Bilirubin Urine: NEGATIVE
GLUCOSE, UA: NEGATIVE mg/dL
Hgb urine dipstick: NEGATIVE
Ketones, ur: NEGATIVE mg/dL
Leukocytes, UA: NEGATIVE
NITRITE: NEGATIVE
PH: 6.5 (ref 5.0–8.0)
Protein, ur: NEGATIVE mg/dL
SPECIFIC GRAVITY, URINE: 1.015 (ref 1.005–1.030)
Urobilinogen, UA: 0.2 mg/dL (ref 0.0–1.0)

## 2013-07-31 LAB — BASIC METABOLIC PANEL
BUN: 20 mg/dL (ref 6–23)
CHLORIDE: 104 meq/L (ref 96–112)
CO2: 27 mEq/L (ref 19–32)
Calcium: 10.2 mg/dL (ref 8.4–10.5)
Creatinine, Ser: 1 mg/dL (ref 0.50–1.35)
GFR calc Af Amer: 82 mL/min — ABNORMAL LOW (ref >90)
GFR calc non Af Amer: 70 mL/min — ABNORMAL LOW (ref >90)
Glucose, Bld: 91 mg/dL (ref 70–99)
Potassium: 4.2 mEq/L (ref 3.7–5.3)
SODIUM: 143 meq/L (ref 137–147)

## 2013-07-31 LAB — I-STAT CG4 LACTIC ACID, ED: Lactic Acid, Venous: 0.74 mmol/L (ref 0.5–2.2)

## 2013-07-31 MED ORDER — AMOXICILLIN-POT CLAVULANATE 875-125 MG PO TABS
1.0000 | ORAL_TABLET | Freq: Once | ORAL | Status: AC
  Administered 2013-07-31: 1 via ORAL
  Filled 2013-07-31: qty 1

## 2013-07-31 MED ORDER — AMOXICILLIN-POT CLAVULANATE 875-125 MG PO TABS: ORAL_TABLET | ORAL | Status: DC

## 2013-07-31 NOTE — ED Provider Notes (Signed)
CSN: [id number]     Arrival date & time 07/31/13  0550 History   First MD Initiated Contact with Patient 07/31/13 0557     Chief Complaint  Patient presents with  . Abdominal Pain     Patient is a 80 y.o. male presenting with abdominal pain. The history is provided by the patient.  Abdominal Pain Pain location:  LLQ Pain quality: aching   Pain radiates to:  Does not radiate Pain severity:  Moderate Onset quality:  Gradual Duration:  3 weeks Timing:  Constant Progression:  Worsening Relieved by:  Nothing Worsened by:  Palpation Associated symptoms: constipation   Associated symptoms: no chest pain, no diarrhea, no dysuria, no fever, no hematochezia, no melena and no vomiting   pt reports he has had LLQ pain for about 3 weeks, ever since having colonoscopy and MR prostate in the same week.  He reports tonight he moved around in bed and his pain became abruptly worse.  He has known diverticulosis  He denies any changes in urine output He reports he is supposed to have prostate biopsy soon after his recent MRI  Past Medical History  Diagnosis Date  . Benign prostatic hypertrophy     hx of  . Other and unspecified hyperlipidemia   . Personal history of unspecified disorders of nervous system and sense organs   . Mitral valve disorders   . Unspecified sleep apnea   . Morbid obesity   . Personal history of urinary calculi   . Diverticulosis of colon (without mention of hemorrhage)   . Other thalassemia   . First degree atrioventricular block   . Gout   . Osteoarthritis   . Basal cell carcinoma   . Anemia   . GERD (gastroesophageal reflux disease)   . Peptic ulcer with hemorrhage     duodenum  . Blood transfusion without reported diagnosis 1956    gastrectomy   Past Surgical History  Procedure Laterality Date  . Tumor removed Left 2002    from finger- left index  . Moles removed  2008  . Cataract extraction, bilateral  [id number]    hx of  . Gastrectomy      hx of  '56. Transfused 7 pints blood '55, 4 pints '56   Family History  Problem Relation Age of Onset  . Coronary artery disease Father   . Heart attack Father   . Gout Father   . Cancer Daughter     skin  . Colon cancer Neg Hx    History  Substance Use Topics  . Smoking status: Former Smoker    Start date: 01/21/1970  . Smokeless tobacco: Never Used     Comment: quite in 1971  . Alcohol Use: No    Review of Systems  Constitutional: Negative for fever.  Cardiovascular: Negative for chest pain.  Gastrointestinal: Positive for abdominal pain and constipation. Negative for vomiting, diarrhea, blood in stool, melena and hematochezia.  Genitourinary: Negative for dysuria.  Musculoskeletal: Negative for back pain.  Neurological: Negative for weakness.  All other systems reviewed and are negative.     Allergies  Prednisone and Sulfonamide derivatives  Home Medications   Prior to Admission medications   Medication Sig Start Date End Date Taking? Authorizing Provider  acetaminophen (TYLENOL) 500 MG tablet Take 500 mg by mouth every 6 (six) hours as needed for pain (take 2 tablets prn).   Yes Historical Provider, MD  allopurinol (ZYLOPRIM) 300 MG tablet Take 1 tablet (300 mg total) by  mouth daily. 05/04/13  Yes Neena Rhymes, MD  aspirin 81 MG tablet Take 81 mg by mouth daily.     Yes Historical Provider, MD  b complex vitamins capsule Take 1 capsule by mouth daily.   Yes Historical Provider, MD  Cetirizine HCl (ZYRTEC PO) Take 1 tablet by mouth as needed. Pt alternates with Allegra 180 mg prn   Yes Historical Provider, MD  Cholecalciferol (VITAMIN D3) 2000 UNITS capsule Take 1 capsule (2,000 Units total) by mouth daily. 11/02/12  Yes Neena Rhymes, MD  Coenzyme Q-10 100 MG capsule Take 300 mg by mouth daily.    Yes Historical Provider, MD  colchicine 0.6 MG tablet Take 0.6 mg by mouth as needed (2 tabs followed by 1 tab 2 hours later-repeat next day if pain persists.).  05/08/13  Yes  Neena Rhymes, MD  Cyanocobalamin (VITAMIN B 12 PO) Take by mouth.     Yes Historical Provider, MD  fexofenadine (ALLEGRA) 180 MG tablet Take 180 mg by mouth as needed for allergies or rhinitis. Alternates with Zyrtec   Yes Historical Provider, MD  ibuprofen (ADVIL,MOTRIN) 200 MG tablet Take 200 mg by mouth every 6 (six) hours as needed for pain.   Yes Historical Provider, MD  Multiple Vitamins-Minerals (CENTRUM SILVER ULTRA MENS PO) Take 1 capsule by mouth daily.   Yes Historical Provider, MD  Omega-3 Fatty Acids (FISH OIL) 600 MG CAPS Take by mouth.   Yes Historical Provider, MD  saw palmetto 160 MG capsule 160 mg. Take 2 tabs  daily   Yes Historical Provider, MD   BP 117/59  Pulse 55  Temp(Src) 98.6 F (37 C) (Oral)  Resp 18  Ht 5\' 7"  (1.702 m)  Wt 199 lb (90.266 kg)  BMI 31.16 kg/m2  SpO2 97% Physical Exam CONSTITUTIONAL: Well developed/well nourished, no distress noted HEAD: Normocephalic/atraumatic EYES: EOMI/PERRL ENMT: Mucous membranes moist NECK: supple no meningeal signs SPINE:entire spine nontender CV: S1/S2 noted, no murmurs/rubs/gallops noted LUNGS: Lungs are clear to auscultation bilaterally, no apparent distress ABDOMEN: soft, mild LLQ tenderness, no rebound or guarding GU:no cva tenderness, no inguinal hernia noted, no scrotal tenderness, chaperone present NEURO: Pt is awake/alert, moves all extremitiesx4 EXTREMITIES: pulses normal, full ROM SKIN: warm, color normal PSYCH: no abnormalities of mood noted  ED Course  Procedures  6:20 AM Pt well appearing here with LLQ pain that worsened tonight He had c-scope at end of April that revealed diverticulosis, and had recent MR prostate that reveal possible carcinoma but also BPH Will start with labs/urinalysis and reassess Pt does not request any pain meds at this time  At time of discharge Pt well appearing, reading a book, no distress.   Will empirically treat for diverticulitis as known h/o diverticulosis  with LLQ pain Labs/vitals reassuring I told patient that without further imaging, we would not know definitively, but he feels comfortable with plan and does not want further testing.  He does not want pain meds. He has no signs of acute surgical abdomen at this time We discussed strict ER return precautions   Labs Review Labs Reviewed  BASIC METABOLIC PANEL - Abnormal; Notable for the following:    GFR calc non Af Amer 70 (*)    GFR calc Af Amer 82 (*)    All other components within normal limits  CBC WITH DIFFERENTIAL - Abnormal; Notable for the following:    Hemoglobin 11.6 (*)    HCT 33.7 (*)    MCV 61.5 (*)  MCH 21.2 (*)    RDW 16.3 (*)    All other components within normal limits  URINALYSIS, ROUTINE W REFLEX MICROSCOPIC  I-STAT CG4 LACTIC ACID, ED      MDM   Final diagnoses:  Abdominal pain    Nursing notes including past medical history and social history reviewed and considered in documentation Labs/vital reviewed and considered Previous records reviewed and considered     Sharyon Cable, MD 07/31/13 [phone]

## 2013-07-31 NOTE — ED Notes (Signed)
Pt presents with LLQ pain x3 weeks, that has worsened over the past 2 days. Pt reports a colonoscopy/prostate MRI 3 weeks ago. Pt states he feels constipated.

## 2013-07-31 NOTE — Telephone Encounter (Signed)
Pt states he was seen at [facility] for diverticulitis and placed on amoxicillin. Pt was told to follow a clear liquid diet and he wanted to know if he should do that. Discussed with pt that it is a good idea to do this to allow the bowel to rest. Pt verbalized understanding.

## 2013-08-15 ENCOUNTER — Telehealth: Payer: Self-pay | Admitting: Internal Medicine

## 2013-08-15 NOTE — Telephone Encounter (Signed)
Pt states he was seen in the hospital recently and treated for diverticulitis. He has finished the meds he was given and states he still has LLQ pain. Pt scheduled to see Nicoletta Ba PA tomorrow at 3pm. Pt aware of appt.

## 2013-08-16 ENCOUNTER — Ambulatory Visit (INDEPENDENT_AMBULATORY_CARE_PROVIDER_SITE_OTHER): Payer: Medicare Other | Admitting: Physician Assistant

## 2013-08-16 ENCOUNTER — Encounter: Payer: Self-pay | Admitting: Physician Assistant

## 2013-08-16 VITALS — BP 120/68 | HR 49 | Ht 67.0 in | Wt 192.8 lb

## 2013-08-16 DIAGNOSIS — K5732 Diverticulitis of large intestine without perforation or abscess without bleeding: Secondary | ICD-10-CM

## 2013-08-16 MED ORDER — METRONIDAZOLE 500 MG PO TABS: 500.0000 mg | ORAL_TABLET | Freq: Two times a day (BID) | ORAL | Status: AC

## 2013-08-16 MED ORDER — CIPROFLOXACIN HCL 500 MG PO TABS: 500.0000 mg | ORAL_TABLET | Freq: Two times a day (BID) | ORAL | Status: AC

## 2013-08-16 NOTE — Patient Instructions (Signed)
We sent prescriptions to [facility].  We have given you samples of Florastor. Take 1 capsule twice daily for 14 days.  You can get this at your pharmacy. [name] may have this as well.  .   You can use Milk of Magnesia or Miralax as needed for constipation. Call us in 1 week if you are not better.

## 2013-08-16 NOTE — Progress Notes (Signed)
Subjective:    Patient ID: Jacob [name], male    DOB: 05-18-1933, 80 y.o.   MRN: [id number]  HPI  Jacob [name] is a pleasant 80 yo  old white male known to Dr. Scarlette Shorts. He has history of adenomatous colon polyps, diverticulosis and is status post partial gastrectomy in the 1950s for peptic ulcer disease. He also has history of beta thalassemia and iron deficiency. Patient had undergone colonoscopy on 07/11/2013 for followup of adenomatous polyps and was found to have severe diverticulosis in the left colon and moderate diverticulosis in the a descending colon. He relates that later that same week he underwent MRI of the prostate and then within several days he had developed left lower quadrant abdominal pain radiated across his lower abdomen. This persisted and he was seen in the emergency room on [address] on 07/31/2013. He was diagnosed with diverticulitis and given a course of Augmentin. He did not have imaging done at that time. He states that he finished the Augmentin last week but that his abdominal pain had not completely resolved. He is not having severe pain but has an ongoing tenderness that is more noticeable with certain movements. He says he's also had increased constipation and has been using milk of magnesia as needed. No associated nausea,iarrhea, fever, chills etc. Patient is very concerned because the MRI of his prostate was abnormal and concerning for prostate cancer and he is set up to have a prostate biopsy done on June 30 with Dr. Gaynelle Arabian which apparently is to be done transrectally. Patient has been on a diet since October of 2014 and has lost about 80 pounds and says otherwise he feels great.    Review of Systems  Constitutional: Negative.   HENT: Negative.   Eyes: Negative.   Respiratory: Negative.   Cardiovascular: Negative.   Gastrointestinal: Positive for abdominal pain and constipation.  Genitourinary: Negative.   Musculoskeletal: Negative.   Skin: Negative.    Allergic/Immunologic: Negative.   Neurological: Negative.   Hematological: Negative.   Psychiatric/Behavioral: Negative.    Outpatient Prescriptions Prior to Visit  Medication Sig Dispense Refill  . acetaminophen (TYLENOL) 500 MG tablet Take 500 mg by mouth every 6 (six) hours as needed for pain (take 2 tablets prn).      Marland Kitchen allopurinol (ZYLOPRIM) 300 MG tablet Take 1 tablet (300 mg total) by mouth daily.  90 tablet  3  . aspirin 81 MG tablet Take 81 mg by mouth daily.        Marland Kitchen b complex vitamins capsule Take 1 capsule by mouth daily.      . Cetirizine HCl (ZYRTEC PO) Take 1 tablet by mouth as needed. Pt alternates with Allegra 180 mg prn      . Cholecalciferol (VITAMIN D3) 2000 UNITS capsule Take 1 capsule (2,000 Units total) by mouth daily.      . Coenzyme Q-10 100 MG capsule Take 300 mg by mouth daily.       . colchicine 0.6 MG tablet Take 0.6 mg by mouth as needed (2 tabs followed by 1 tab 2 hours later-repeat next day if pain persists.).       Marland Kitchen Cyanocobalamin (VITAMIN B 12 PO) Take by mouth.        . fexofenadine (ALLEGRA) 180 MG tablet Take 180 mg by mouth as needed for allergies or rhinitis. Alternates with Zyrtec      . ibuprofen (ADVIL,MOTRIN) 200 MG tablet Take 200 mg by mouth every 6 (six) hours as needed  for pain.      . Multiple Vitamins-Minerals (CENTRUM SILVER ULTRA MENS PO) Take 1 capsule by mouth daily.      . Omega-3 Fatty Acids (FISH OIL) 600 MG CAPS Take by mouth.      . saw palmetto 160 MG capsule 160 mg. Take 2 tabs  daily      . amoxicillin-clavulanate (AUGMENTIN) 875-125 MG per tablet One po bid x 10 days  20 tablet  0   No facility-administered medications prior to visit.   Allergies  Allergen Reactions  . Prednisone     REACTION: GI bleed  . Sulfonamide Derivatives     REACTION: Rash   Patient Active Problem List   Diagnosis Date Noted  . Iron deficiency anemia, unspecified 02/28/2013  . Need for prophylactic vaccination and inoculation against  influenza 12/31/2011  . Routine health maintenance 01/20/2011  . FLATULENCE-GAS-BLOATING 10/14/2009  . GERD 09/18/2009  . PERSONAL HX COLONIC POLYPS 09/18/2009  . PALPITATIONS 11/20/2008  . MURMUR 11/20/2008  . LABRYNTHITIS 09/26/2008  . HEMATURIA UNSPECIFIED 07/04/2008  . DIVERTICULITIS, HX OF 07/04/2008  . COUGH 05/24/2008  . Gout, unspecified 08/29/2007  . Unspecified essential hypertension 08/29/2007  . CHRONIC RHINITIS 08/29/2007  . BENIGN PROSTATIC HYPERTROPHY 08/29/2007  . OSTEOARTHRITIS 08/29/2007  . INTERMITTENT VERTIGO 08/29/2007  . DYSLIPIDEMIA 12/08/2006  . THALASSEMIA NEC 12/08/2006  . MITRAL VALVE PROLAPSE 12/08/2006  . BLOCK, AV, 1ST DEGREE 12/08/2006  . PEPTIC ULCER DISEASE 12/08/2006  . DIVERTICULOSIS, COLON 12/08/2006  . SLEEP APNEA 12/08/2006  . CATARACT, HX OF 12/08/2006  . NEPHROLITHIASIS, HX OF 12/08/2006  . MORBID OBESITY, HX OF 12/08/2006  . GASTRECTOMY, HX OF 12/08/2006  . CATARACT EXTRACTION, HX OF 12/08/2006   History  Substance Use Topics  . Smoking status: Former Smoker    Start date: 01/21/1970  . Smokeless tobacco: Never Used     Comment: quite in 1971  . Alcohol Use: No      family history includes Cancer in his daughter; Coronary artery disease in his father; Gout in his father; Heart attack in his father. There is no history of Colon cancer.  Objective:   Physical Exam   White male in no acute distress, pleasant blood pressure 120/68 pulse 49 height 5 foot 7 weight 192. HEENT; nontraumatic normocephalic EOMI PERRLA sclera anicteric, Supple ;no JVD, Cardiovascular; regular rate and rhythm with S1-S2 no murmur or gallop, Pulmonary; clear bilaterally, Abdomen; soft he has a midline incisional scar , he is tender in the left lower quadrant no guarding or rebound there is some mild fullness no palpable mass or hepatosplenomegaly bowel sounds present, Rectal; exam not done, Extremities no clubbing cyanosis or edema skin warm and dry, Psych ;mood  and affect appropriate       Assessment & Plan:  #26 80 year old male with persistent diverticulitis partially resolved after a course of Augmentin. #2 abnormal MRI of the prostate concerning for prostate cancer biopsy schedule 09/12/2013 this is to be done transrectally #3 history of adenomatous colon polyps #4 status post remote partial gastrectomy #5 beta thalassemia #6 iron deficiency  Plan; start Cipro 500 mg by mouth twice daily x14 days and Flagyl 500 mg by mouth twice daily x14 days both to be taken with food. Have also added a probiotic in the form of Florastor twice daily x2-3 weeks Patient is encouraged to call if his symptoms have not improved in 1 week and at that time would proceed with CT of the abdomen and pelvis. Is also asked  to call if he has not completely resolved his discomfort after he completes the antibiotics as we certainly want him to be well prior to scheduled prostate biopsy He will continue milk of magnesia as needed or MiraLax 17 g daily as needed for constipation.

## 2013-08-17 ENCOUNTER — Telehealth: Payer: Self-pay | Admitting: Physician Assistant

## 2013-08-17 NOTE — Progress Notes (Signed)
Agree 

## 2013-08-17 NOTE — Telephone Encounter (Signed)
Spoke with pt and he is aware. 

## 2013-08-17 NOTE — Telephone Encounter (Signed)
Patient is calling with concerns about Cipro and the side effect of tendon damage. He states he exercises for 40 minutes/daily of rowing, bicycling or treadmill. He is also doing yard work that requires physical activity. He states [place], [place] told him he may need to slow down but he is concerned of tendon damage. He states he will go crazy if he has to stop exercise for 14 days. Please, advise.

## 2013-08-17 NOTE — Telephone Encounter (Signed)
He really needs to be on antibiotics, and already took a course of augmentin which did not work. Cipro does carry a small  risk of tendon damge- he can exercise he just needs to take it easy on the exercise the next couple weeks

## 2013-08-31 ENCOUNTER — Encounter: Payer: Self-pay | Admitting: Physician Assistant

## 2013-09-01 ENCOUNTER — Encounter: Payer: Self-pay | Admitting: *Deleted

## 2013-09-01 ENCOUNTER — Telehealth: Payer: Self-pay | Admitting: Internal Medicine

## 2013-09-01 NOTE — Telephone Encounter (Signed)
Pt states he is supposed to have a prostate biopsy the end of the month and needs clearance from diverticulitis. Pt scheduled to see Alonza Bogus PA 09/05/13@2pm . Pts wife aware of appt.

## 2013-09-05 ENCOUNTER — Encounter: Payer: Self-pay | Admitting: Gastroenterology

## 2013-09-05 ENCOUNTER — Telehealth: Payer: Self-pay | Admitting: *Deleted

## 2013-09-05 ENCOUNTER — Ambulatory Visit (INDEPENDENT_AMBULATORY_CARE_PROVIDER_SITE_OTHER): Payer: Medicare Other | Admitting: Gastroenterology

## 2013-09-05 VITALS — BP 124/58 | Ht 67.0 in | Wt 191.0 lb

## 2013-09-05 DIAGNOSIS — K5900 Constipation, unspecified: Secondary | ICD-10-CM

## 2013-09-05 MED ORDER — LUBIPROSTONE 8 MCG PO CAPS: 8.0000 ug | ORAL_CAPSULE | Freq: Two times a day (BID) | ORAL | Status: DC

## 2013-09-05 NOTE — Progress Notes (Signed)
     09/05/2013 UTAH [name] [id number] 10/20/1933   History of Present Illness:  This is a pleasant 81 year old white male known to Jacob [name]. He has history of adenomatous colon polyps, diverticulosis, and is status post partial gastrectomy in the 1950s for peptic ulcer disease. He also has history of beta thalassemia and iron deficiency. Patient had undergone colonoscopy on 07/11/2013 for followup of adenomatous polyps and was found to have severe diverticulosis in the left colon and moderate diverticulosis in the a descending colon.  He was treated recently for diverticulitis with a course of Augmentin via the urgent care and then was seen here on 6/3 with ongoing pain and was given cipro and flagyl for 14 days.  There was no CT imaging performed.  He is here today for "clearance" regarding his diverticulitis to be sure that it has resolved prior to undergoing transrectal prostate biopsy next week.  He denies any further LLQ abdominal pain.  He has some lower abdominal discomfort when doing crunches and lifting his legs, but that pain has been present since February.  The LLQ abdominal pain that he had with the diverticulitis has resolved.  He does continue to report some constipation.  Is taking MOM daily but is asking if there is anything different that he could try.  He did not have any improvement in the past with daily Miralax or fiber supplements.   Current Medications, Allergies, Past Medical History, Past Surgical History, Family History and Social History were reviewed in [employer] record.   Physical Exam: BP 124/58  Ht 5\' 7"  (1.702 m)  Wt 191 lb (86.637 kg)  BMI 29.91 kg/m2 General: Well developed white male in no acute distress Head: Normocephalic and atraumatic Eyes:  Sclerae anicteric, conjunctiva pink  Ears: Normal auditory acuity Lungs: Clear throughout to auscultation Heart: Regular rate and rhythm Abdomen: Soft, non-distended.  Normal  bowel sounds.  Non-tender. Musculoskeletal: Symmetrical with no gross deformities  Extremities: No edema  Neurological: Alert oriented x 4, grossly non-focal Psychological:  Alert and cooperative. Normal mood and affect  Assessment and Recommendations: -Diverticulitis:  I believe that this has resolved and that he is clear to have his transrectal prostate biopsy on 6/30. -Constipation:  Could continue MOM if he wishes, but since he is taking this daily then we could try amitiza 8 mcg BID with food instead (samples given and he will call back for a prescription if he would like to continue).

## 2013-09-05 NOTE — Telephone Encounter (Signed)
Message copied by Hulan Saas on Tue Sep 05, 2013  3:21 PM ------      Message from: [place], [place] S      Created: Tue Sep 05, 2013  3:04 PM       Please get pt an appt with me early next week , if he doesn't want to wait that long please offer follow up with Nevin Bloodgood this week ------

## 2013-09-05 NOTE — Patient Instructions (Addendum)
You have been provided samples of Amitiza 8 mcg please take one tablet twice daily with food.  Please call for a prescription if this works for you We will send a copy of today's note to your [facility]

## 2013-09-05 NOTE — Telephone Encounter (Signed)
Spoke with patient's wife and he saw Alonza Bogus, [place] today.

## 2013-09-06 NOTE — Telephone Encounter (Signed)
ok 

## 2013-09-06 NOTE — Progress Notes (Signed)
agree with initial assessment and plans

## 2013-11-29 ENCOUNTER — Encounter: Payer: Self-pay | Admitting: Internal Medicine

## 2013-12-13 ENCOUNTER — Telehealth: Payer: Self-pay

## 2013-12-13 NOTE — Telephone Encounter (Signed)
Pt is with [facility] now.

## 2014-01-08 ENCOUNTER — Other Ambulatory Visit (HOSPITAL_BASED_OUTPATIENT_CLINIC_OR_DEPARTMENT_OTHER): Payer: Medicare Other

## 2014-01-08 DIAGNOSIS — D509 Iron deficiency anemia, unspecified: Secondary | ICD-10-CM

## 2014-01-08 LAB — IRON AND TIBC CHCC
%SAT: 16 % — AB (ref 20–55)
Iron: 57 ug/dL (ref 42–163)
TIBC: 359 ug/dL (ref 202–409)
UIBC: 301 ug/dL (ref 117–376)

## 2014-01-08 LAB — CBC WITH DIFFERENTIAL/PLATELET
BASO%: 0.7 % (ref 0.0–2.0)
BASOS ABS: 0.1 10*3/uL (ref 0.0–0.1)
EOS ABS: 0.1 10*3/uL (ref 0.0–0.5)
EOS%: 1.1 % (ref 0.0–7.0)
HCT: 34.2 % — ABNORMAL LOW (ref 38.4–49.9)
HEMOGLOBIN: 10.8 g/dL — AB (ref 13.0–17.1)
LYMPH%: 16.3 % (ref 14.0–49.0)
MCH: 20 pg — AB (ref 27.2–33.4)
MCHC: 31.4 g/dL — ABNORMAL LOW (ref 32.0–36.0)
MCV: 63.7 fL — AB (ref 79.3–98.0)
MONO#: 0.8 10*3/uL (ref 0.1–0.9)
MONO%: 9.5 % (ref 0.0–14.0)
NEUT#: 6.1 10*3/uL (ref 1.5–6.5)
NEUT%: 72.4 % (ref 39.0–75.0)
PLATELETS: 158 10*3/uL (ref 140–400)
RBC: 5.38 10*6/uL (ref 4.20–5.82)
RDW: 17.1 % — ABNORMAL HIGH (ref 11.0–14.6)
WBC: 8.5 10*3/uL (ref 4.0–10.3)
lymph#: 1.4 10*3/uL (ref 0.9–3.3)

## 2014-01-08 LAB — FERRITIN CHCC: Ferritin: 44 ng/ml (ref 22–316)

## 2014-01-15 ENCOUNTER — Encounter: Payer: Self-pay | Admitting: Internal Medicine

## 2014-01-15 ENCOUNTER — Ambulatory Visit (HOSPITAL_BASED_OUTPATIENT_CLINIC_OR_DEPARTMENT_OTHER): Payer: Medicare Other | Admitting: Internal Medicine

## 2014-01-15 VITALS — BP 141/60 | HR 52 | Temp 97.8°F | Resp 18 | Ht 67.0 in | Wt 191.6 lb

## 2014-01-15 DIAGNOSIS — D561 Beta thalassemia: Secondary | ICD-10-CM

## 2014-01-15 DIAGNOSIS — D509 Iron deficiency anemia, unspecified: Secondary | ICD-10-CM

## 2014-01-15 DIAGNOSIS — D568 Other thalassemias: Secondary | ICD-10-CM

## 2014-01-15 NOTE — Progress Notes (Signed)
[place] [phone]   [phone]  OFFICE PROGRESS NOTE  No PCP Per Patient No address on file  DIAGNOSIS: History of beta thalassemia minor and iron deficiency anemia  PRIOR THERAPY:  Integra plus 1 capsule by mouth daily.  CURRENT THERAPY: None.  INTERVAL HISTORY: Jacob [name] 80 y.o. male returns to the clinic today for followup visit. The patient is feeling very wellwith no fatigue or weakness. He is currently under a lot of stress taking care of his wife and daughters dealing with some medical issues. He denied having any significant fever or chills, no nausea or vomiting. He has no chest pain, shortness of breath, cough or hemoptysis. He has no significant weight loss or night sweats. He had repeat CBC and iron study performed recently and he is here for evaluation and discussion of his lab results. He quit taking the Integra plus.  MEDICAL HISTORY: Past Medical History  Diagnosis Date  . Benign prostatic hypertrophy     hx of  . Other and unspecified hyperlipidemia   . Personal history of unspecified disorders of nervous system and sense organs   . Mitral valve disorders   . Unspecified sleep apnea   . Morbid obesity   . Personal history of urinary calculi   . Diverticulosis of colon (without mention of hemorrhage)   . Other thalassemia   . First degree atrioventricular block   . Gout   . Osteoarthritis   . Basal cell carcinoma   . Anemia   . GERD (gastroesophageal reflux disease)   . Peptic ulcer with hemorrhage     duodenum  . Blood transfusion without reported diagnosis 1956    gastrectomy  . Colon polyp 2006, 2010    TUBULAR ADENOMA    ALLERGIES:  is allergic to prednisone and sulfonamide derivatives.  MEDICATIONS:  Current Outpatient Prescriptions  Medication Sig Dispense Refill  . acetaminophen (TYLENOL) 500 MG tablet Take 500 mg by mouth every 6 (six) hours as needed for pain (take 2 tablets prn).    Marland Kitchen  allopurinol (ZYLOPRIM) 300 MG tablet Take 1 tablet (300 mg total) by mouth daily. 90 tablet 3  . aspirin 81 MG tablet Take 81 mg by mouth daily.      Marland Kitchen b complex vitamins capsule Take 1 capsule by mouth daily.    . Cetirizine HCl (ZYRTEC PO) Take 1 tablet by mouth as needed. Pt alternates with Allegra 180 mg prn    . Cholecalciferol (VITAMIN D3) 2000 UNITS capsule Take 1 capsule (2,000 Units total) by mouth daily.    . Coenzyme Q-10 100 MG capsule Take 300 mg by mouth daily.     . colchicine 0.6 MG tablet Take 0.6 mg by mouth as needed (2 tabs followed by 1 tab 2 hours later-repeat next day if pain persists.).     Marland Kitchen Cyanocobalamin (VITAMIN B 12 PO) Take by mouth.      . fexofenadine (ALLEGRA) 180 MG tablet Take 180 mg by mouth as needed for allergies or rhinitis. Alternates with Zyrtec    . ibuprofen (ADVIL,MOTRIN) 200 MG tablet Take 200 mg by mouth every 6 (six) hours as needed for pain.    . Multiple Vitamins-Minerals (CENTRUM SILVER ULTRA MENS PO) Take 1 capsule by mouth daily.    . Omega-3 Fatty Acids (FISH OIL) 600 MG CAPS Take by mouth.    . tamsulosin (FLOMAX) 0.4 MG CAPS capsule Take 0.4 mg by mouth daily.  [place]  No current facility-administered medications for this visit.    SURGICAL HISTORY:  Past Surgical History  Procedure Laterality Date  . Tumor removed Left 2002    from finger- left index  . Moles removed  2008  . Cataract extraction, bilateral  [id number]    hx of  . Gastrectomy      hx of '56. Transfused 7 pints blood '55, 4 pints '56  . Colonoscopy  2015    multiple   . Esophagogastroduodenoscopy  2011    multiple    REVIEW OF SYSTEMS:  A comprehensive review of systems was negative.   PHYSICAL EXAMINATION: General appearance: alert, cooperative and no distress Head: Normocephalic, without obvious abnormality, atraumatic Neck: no adenopathy, no JVD, supple, symmetrical, trachea midline and thyroid not enlarged, symmetric, no tenderness/mass/nodules Lymph nodes:  Cervical, supraclavicular, and axillary nodes normal. Resp: clear to auscultation bilaterally Back: symmetric, no curvature. ROM normal. No CVA tenderness. Cardio: regular rate and rhythm, S1, S2 normal, no murmur, click, rub or gallop GI: soft, non-tender; bowel sounds normal; no masses,  no organomegaly Extremities: extremities normal, atraumatic, no cyanosis or edema  ECOG PERFORMANCE STATUS: 0 - Asymptomatic  Blood pressure 141/60, pulse 52, temperature 97.8 F (36.6 C), temperature source Oral, resp. rate 18, height 5\' 7"  (1.702 m), weight 191 lb 9.6 oz (86.909 kg), SpO2 100 %.  LABORATORY DATA: Lab Results  Component Value Date   WBC 8.5 01/08/2014   HGB 10.8* 01/08/2014   HCT 34.2* 01/08/2014   MCV 63.7* 01/08/2014   PLT 158 01/08/2014      Chemistry      Component Value Date/Time   NA 143 07/31/2013 0625   NA 141 12/12/2012 1522   K 4.2 07/31/2013 0625   K 4.2 12/12/2012 1522   CL 104 07/31/2013 0625   CO2 27 07/31/2013 0625   CO2 24 12/12/2012 1522   BUN 20 07/31/2013 0625   BUN 22.4 12/12/2012 1522   CREATININE 1.00 07/31/2013 0625   CREATININE 1.1 12/12/2012 1522      Component Value Date/Time   CALCIUM 10.2 07/31/2013 0625   CALCIUM 9.7 12/12/2012 1522   ALKPHOS 70 05/03/2013 1300   ALKPHOS 86 12/12/2012 1522   AST 26 05/03/2013 1300   AST 21 12/12/2012 1522   ALT 20 05/03/2013 1300   ALT 19 12/12/2012 1522   BILITOT 1.9* 05/03/2013 1300   BILITOT 0.96 12/12/2012 1522     Other lab results: Ferritin 44, serum iron 57, total iron binding capacity 301 and iron saturation 16%.  RADIOGRAPHIC STUDIES: No results found.  ASSESSMENT AND PLAN: This is a very pleasant 80 years old white male with beta thalassemia minor as well as iron deficiency anemia. He has mild anemia consistent with his history of thalassemia minor. His serum iron and ferritin as well as the iron saturation is drifting down. I recommended for the patient to resume treatment with oral  iron tablets and advise him to take over-the-counter ferrous sulfate 1-2 tablets by mouth daily. I would see him back for followup visit in 6 months with repeat CBC, iron study and ferritin. He was advised to call immediately if he has any concerning symptoms in the interval. The patient voices understanding of current disease status and treatment options and is in agreement with the current care plan.  All questions were answered. The patient knows to call the clinic with any problems, questions or concerns. We can certainly see the patient much sooner if necessary.  Disclaimer: This note  was dictated with voice recognition software. Similar sounding words can inadvertently be transcribed and may not be corrected upon review.

## 2014-05-16 ENCOUNTER — Ambulatory Visit (INDEPENDENT_AMBULATORY_CARE_PROVIDER_SITE_OTHER): Payer: Medicare Other | Admitting: Podiatry

## 2014-05-16 ENCOUNTER — Encounter: Payer: Self-pay | Admitting: Podiatry

## 2014-05-16 VITALS — BP 116/60 | HR 63 | Resp 18

## 2014-05-16 DIAGNOSIS — M79676 Pain in unspecified toe(s): Secondary | ICD-10-CM | POA: Diagnosis not present

## 2014-05-16 DIAGNOSIS — Q828 Other specified congenital malformations of skin: Secondary | ICD-10-CM | POA: Diagnosis not present

## 2014-05-16 DIAGNOSIS — B351 Tinea unguium: Secondary | ICD-10-CM

## 2014-05-16 DIAGNOSIS — B353 Tinea pedis: Secondary | ICD-10-CM

## 2014-05-16 MED ORDER — KETOCONAZOLE 2 % EX CREA: 1.0000 "application " | TOPICAL_CREAM | Freq: Every day | CUTANEOUS | Status: DC

## 2014-05-16 NOTE — Progress Notes (Signed)
   Subjective:    Patient ID: Jacob [name], male    DOB: September 18, 1933, 81 y.o.   MRN: [id number]  HPI  81 year old male presents the office today with complaints of right third toe pain. He states he has had pain to this area over the last couple of months and his been progressive. He states he has pain particularly with pressure and ambulation. He is unsure was going on with the toe in a may be ingrown toenail or callus which he is unable to see himself. He denies any redness or drainage along the toenail or on the digit. He denies any history of injury or trauma to the area. No other complaints at this time.   Review of Systems  All other systems reviewed and are negative.      Objective:   Physical Exam AAO 3, NAD DP/PT pulses palpable, CRT less than 3 seconds Protective sensation intact with Simms Weinstein monofilament, vibratory sensation intact, Achilles tendon reflex intact. On the distal aspect of the right third digit there is a hyperkeratotic lesion which is tender to palpation. Upon debridement a lesion there is no underlying ulceration, drainage or other clinical signs of infection. There is underlying hammertoe identified. There is no evidence of ingrowing along the toenail. There is also a hyperkeratotic lesion along the left second digit distally. Upon debridement underlying ulceration or signs of infection. There is underlying hammertoe contractures of lesser digits particularly the right third and left second digit. Nails are hypertrophic, dystrophic, discolored, elongated, brittle. They're subjective tenderness on the nails 1-5 bilaterally. There is no swelling erythema or drainage on the nail sites. On the dorsal aspect of the right foot there is a dry, peeling, scaly area with the erythematous base which is likely consistent with tinea pedis. No other open lesions or pre-ulcer lesions identified bilaterally. No pain with calf compression, swelling, warmth,  erythema.       Assessment & Plan:  81 year old male with symptomatic onychomycosis, hyperkeratotic lesions 2, likely tinea pedis -Treatment options were discussed with the patient include alternatives, risks, complications. -Hyperkeratotic lesions were sharply debrided 2 without complication/bleeding. I discussed with the patient likely etiology of the pain. -Nail sharply debrided 10 without complication/bleeding. -Dispensed offloading pads. -Prescribed ketoconazole for likely tinea pedis. If not resolved within the next couple weeks to call the office for follow-up appointment. -Follow-up as needed. In the meantime, encouraged call the office with any question, concerns, change in symptoms.

## 2014-05-17 ENCOUNTER — Encounter: Payer: Self-pay | Admitting: Podiatry

## 2014-05-25 ENCOUNTER — Emergency Department (HOSPITAL_BASED_OUTPATIENT_CLINIC_OR_DEPARTMENT_OTHER)
Admission: EM | Admit: 2014-05-25 | Discharge: 2014-05-25 | Disposition: A | Discharge status: Home / Self Care | Payer: Medicare Other | Attending: Emergency Medicine | Admitting: Emergency Medicine

## 2014-05-25 ENCOUNTER — Emergency Department (HOSPITAL_BASED_OUTPATIENT_CLINIC_OR_DEPARTMENT_OTHER): Payer: Medicare Other

## 2014-05-25 ENCOUNTER — Telehealth: Payer: Self-pay | Admitting: Cardiology

## 2014-05-25 ENCOUNTER — Encounter (HOSPITAL_BASED_OUTPATIENT_CLINIC_OR_DEPARTMENT_OTHER): Payer: Self-pay | Admitting: *Deleted

## 2014-05-25 DIAGNOSIS — I441 Atrioventricular block, second degree: Secondary | ICD-10-CM | POA: Insufficient documentation

## 2014-05-25 DIAGNOSIS — Z79899 Other long term (current) drug therapy: Secondary | ICD-10-CM | POA: Diagnosis not present

## 2014-05-25 DIAGNOSIS — Z86018 Personal history of other benign neoplasm: Secondary | ICD-10-CM | POA: Diagnosis not present

## 2014-05-25 DIAGNOSIS — Z8719 Personal history of other diseases of the digestive system: Secondary | ICD-10-CM | POA: Diagnosis not present

## 2014-05-25 DIAGNOSIS — Z85828 Personal history of other malignant neoplasm of skin: Secondary | ICD-10-CM | POA: Diagnosis not present

## 2014-05-25 DIAGNOSIS — Z87442 Personal history of urinary calculi: Secondary | ICD-10-CM | POA: Diagnosis not present

## 2014-05-25 DIAGNOSIS — Z87891 Personal history of nicotine dependence: Secondary | ICD-10-CM | POA: Diagnosis not present

## 2014-05-25 DIAGNOSIS — M7989 Other specified soft tissue disorders: Secondary | ICD-10-CM | POA: Diagnosis present

## 2014-05-25 DIAGNOSIS — M109 Gout, unspecified: Secondary | ICD-10-CM | POA: Insufficient documentation

## 2014-05-25 DIAGNOSIS — R600 Localized edema: Secondary | ICD-10-CM | POA: Diagnosis not present

## 2014-05-25 DIAGNOSIS — Z7982 Long term (current) use of aspirin: Secondary | ICD-10-CM | POA: Diagnosis not present

## 2014-05-25 DIAGNOSIS — M1990 Unspecified osteoarthritis, unspecified site: Secondary | ICD-10-CM | POA: Insufficient documentation

## 2014-05-25 DIAGNOSIS — Z8711 Personal history of peptic ulcer disease: Secondary | ICD-10-CM | POA: Insufficient documentation

## 2014-05-25 DIAGNOSIS — R609 Edema, unspecified: Secondary | ICD-10-CM

## 2014-05-25 DIAGNOSIS — N400 Enlarged prostate without lower urinary tract symptoms: Secondary | ICD-10-CM | POA: Insufficient documentation

## 2014-05-25 DIAGNOSIS — D649 Anemia, unspecified: Secondary | ICD-10-CM | POA: Insufficient documentation

## 2014-05-25 DIAGNOSIS — E785 Hyperlipidemia, unspecified: Secondary | ICD-10-CM | POA: Diagnosis not present

## 2014-05-25 LAB — CBC WITH DIFFERENTIAL/PLATELET
BASOS ABS: 0.1 10*3/uL (ref 0.0–0.1)
Basophils Relative: 1 % (ref 0–1)
EOS PCT: 3 % (ref 0–5)
Eosinophils Absolute: 0.2 10*3/uL (ref 0.0–0.7)
HEMATOCRIT: 31.8 % — AB (ref 39.0–52.0)
Hemoglobin: 10.4 g/dL — ABNORMAL LOW (ref 13.0–17.0)
Lymphocytes Relative: 32 % (ref 12–46)
Lymphs Abs: 1.7 10*3/uL (ref 0.7–4.0)
MCH: 19.2 pg — ABNORMAL LOW (ref 26.0–34.0)
MCHC: 32.7 g/dL (ref 30.0–36.0)
MCV: 58.7 fL — AB (ref 78.0–100.0)
Monocytes Absolute: 0.6 10*3/uL (ref 0.1–1.0)
Monocytes Relative: 12 % (ref 3–12)
Neutro Abs: 2.7 10*3/uL (ref 1.7–7.7)
Neutrophils Relative %: 52 % (ref 43–77)
Platelets: 170 10*3/uL (ref 150–400)
RBC: 5.42 MIL/uL (ref 4.22–5.81)
RDW: 15.8 % — ABNORMAL HIGH (ref 11.5–15.5)
WBC: 5.3 10*3/uL (ref 4.0–10.5)

## 2014-05-25 LAB — BASIC METABOLIC PANEL
ANION GAP: 5 (ref 5–15)
BUN: 26 mg/dL — ABNORMAL HIGH (ref 6–23)
CALCIUM: 9.1 mg/dL (ref 8.4–10.5)
CO2: 30 mmol/L (ref 19–32)
Chloride: 107 mmol/L (ref 96–112)
Creatinine, Ser: 0.9 mg/dL (ref 0.50–1.35)
GFR calc Af Amer: >90 mL/min (ref >90)
GFR, EST NON AFRICAN AMERICAN: 79 mL/min — AB (ref >90)
Glucose, Bld: 91 mg/dL (ref 70–99)
Potassium: 4 mmol/L (ref 3.5–5.1)
Sodium: 142 mmol/L (ref 135–145)

## 2014-05-25 LAB — TROPONIN I: Troponin I: <0.03 ng/mL (ref <0.031)

## 2014-05-25 MED ORDER — FUROSEMIDE 40 MG PO TABS: 40.0000 mg | ORAL_TABLET | Freq: Every day | ORAL | Status: DC

## 2014-05-25 NOTE — Discharge Instructions (Signed)
Please follow up as arranged in the emergency department.  Return if light headed, weak or chest pain.   Edema Edema is an abnormal buildup of fluids. It is more common in your legs and thighs. Painless swelling of the feet and ankles is more likely as a person ages. It also is common in looser skin, like around your eyes. HOME CARE   Keep the affected body part above the level of the heart while lying down.  Do not sit still or stand for a long time.  Do not put anything right under your knees when you lie down.  Do not wear tight clothes on your upper legs.  Exercise your legs to help the puffiness (swelling) go down.  Wear elastic bandages or support stockings as told by your doctor.  A low-salt diet may help lessen the puffiness.  Only take medicine as told by your doctor. GET HELP IF:  Treatment is not working.  You have heart, liver, or kidney disease and notice that your skin looks puffy or shiny.  You have puffiness in your legs that does not get better when you raise your legs.  You have sudden weight gain for no reason. GET HELP RIGHT AWAY IF:   You have shortness of breath or chest pain.  You cannot breathe when you lie down.  You have pain, redness, or warmth in the areas that are puffy.  You have heart, liver, or kidney disease and get edema all of a sudden.  You have a fever and your symptoms get worse all of a sudden. MAKE SURE YOU: Second Degree Atrioventricular Block Second degree atrioventricular block is a type of heart block. The heartbeat is a coordinated contraction between the upper and lower chambers of the heart. This coordinated contraction happens because of an electrical impulse that is sent from the upper chambers of the heart to the lower chambers of the heart. The electrical impulse causes the heart to beat and pump blood. Normally, this electrical impulse is transmitted without delay. In a second degree heart block, an interruption occurs in  the heart's electrical impulse between the upper and lower chambers of the heart. When this happens, the heart does not beat in a timely manner, which affects the amount of blood pumped by the heart. There are two types of 2nd degree heart block:   Mobitz Type 1. In this type of 2nd degree heart block, the electrical impulse is gradually delayed more and more until the heart misses a beat. This type of 2nd degree heart block is less serious than Mobitz type 2.  Mobitz Type 2. This type of 2nd degree heart block is more serious and can become a more severe form of heart block. With Mobitz type 2, some of the electrical signals are blocked and do not reach the lower chambers of the heart. This can occur suddenly and without warning. Some people may need a permanent pacemaker with this type of heart block. CAUSES  Second degree heart block may be a result of:  Age. The heart's electrical system can degenerate due to the aging process.  Heart attack. A heart attack can cause scarring which can damage the heart's electrical system.  Open heart surgery can damage and scar areas of the heart which affect the heart's electrical system.  Heart medications such as beta and calcium channel blockers. These kinds of medications can affect the electrical impulse of the heart and can slow the heart rate if the dosage is  too high. SYMPTOMS   Mobitz type 1 - Usually, no symptoms are noticed, but a person may have the same symptoms listed under Mobitz type 2.  Mobitz type 2 - Compared to Mobitz type 1, there is a greater likelihood of experiencing the following symptoms:  Fatigue.  Shortness of breath.  Dizziness or light-headedness.  Fainting.  Chest pain. DIAGNOSIS   Electrocardiogram (ECG). An ECG is a tracing of the heartbeat and can show a 2nd degree heart block.  Holter monitor. A Holter monitor is a continuous heart rhythm recording for 24 hours. This can be helpful in determining the kind of  heart block you have and how it can be treated.  Electrophysiology (EP) study. This is a procedure that tests the electrical pathway of your heart. This type of test is done by a specialist who places long thin tubes (catheters) in your heart. The catheters are used to study your heart and record your heart's electrical signals. TREATMENT   Mobitz Type 1. Generally, no treatment is needed.  Mobitz Type 2. A permanent pacemaker may be needed.  Heart medications such as beta-blockers or calcium channel blockers can slow the heart rate. Your caregiver may need to adjust your heart medication if this is the cause of your heart block. Adjusting your heart medication may reverse the heart block. SEEK MEDICAL CARE IF:   You have unexplained fatigue.  You feel light-headed.  You feel faint.  You feel your heart skipping beats or your heart beats very fast. SEEK IMMEDIATE MEDICAL CARE IF:   You have severe chest pain, especially if the pain is crushing or pressure-like and spreads to the arms, back, neck, or jaw. This is an emergency. Do not wait to see if the pain will go away. Get medical help at once. Call your local emergency services (911 in the [place] Do not drive yourself to the hospital.  You notice increasing shortness of breath during rest, sleeping, or with activity.  You "black out" or faint. MAKE SURE YOU:   Understand these instructions.  Will watch your condition.  Will get help right away if you are not doing well or get worse. Document Released: 02/13/2008 Document Revised: 07/17/2013 Document Reviewed: 02/13/2008 [facility] Patient Information 2015 [place], [place]. This information is not intended to replace advice given to you by your health care provider. Make sure you discuss any questions you have with your health care provider.   Understand these instructions.  Will watch your condition.  Will get help right away if you are not doing well or get worse. Document  Released: 08/19/2007 Document Revised: 03/07/2013 Document Reviewed: 12/23/2012 [facility] Patient Information 2015 [place], [place]. This information is not intended to replace advice given to you by your health care provider. Make sure you discuss any questions you have with your health care provider.

## 2014-05-25 NOTE — ED Provider Notes (Signed)
CSN: [id number]     Arrival date & time 05/25/14  0840 History   First MD Initiated Contact with Patient 05/25/14 1013     Chief Complaint  Patient presents with  . Leg Swelling     (Consider location/radiation/quality/duration/timing/severity/associated sxs/prior Treatment) HPI  81 y.o. Male with history of thalasemia, prostate cancer presents today with 5 days history of bilateral lower extremity swelling.  Patient notes pitting edema 2/3 up lower legs.  He denies chest pain, or dyspnea.  No recent immobilization, or history of dvt.    Past Medical History  Diagnosis Date  . Benign prostatic hypertrophy     hx of  . Other and unspecified hyperlipidemia   . Personal history of unspecified disorders of nervous system and sense organs   . Mitral valve disorders   . Unspecified sleep apnea   . Morbid obesity   . Personal history of urinary calculi   . Diverticulosis of colon (without mention of hemorrhage)   . Other thalassemia   . First degree atrioventricular block   . Gout   . Osteoarthritis   . Basal cell carcinoma   . Anemia   . GERD (gastroesophageal reflux disease)   . Peptic ulcer with hemorrhage     duodenum  . Blood transfusion without reported diagnosis 1956    gastrectomy  . Colon polyp 2006, 2010    TUBULAR ADENOMA   Past Surgical History  Procedure Laterality Date  . Tumor removed Left 2002    from finger- left index  . Moles removed  2008  . Cataract extraction, bilateral  [id number]    hx of  . Gastrectomy      hx of '56. Transfused 7 pints blood '55, 4 pints '56  . Colonoscopy  2015    multiple   . Esophagogastroduodenoscopy  2011    multiple   Family History  Problem Relation Age of Onset  . Coronary artery disease Father   . Heart attack Father   . Gout Father   . Cancer Daughter     skin  . Colon cancer Neg Hx    History  Substance Use Topics  . Smoking status: Former Smoker    Start date: 01/21/1970  . Smokeless tobacco: Never Used     Comment: quite in 1971  . Alcohol Use: No    Review of Systems  All other systems reviewed and are negative.     Allergies  Prednisone and Sulfonamide derivatives  Home Medications   Prior to Admission medications   Medication Sig Start Date End Date Taking? Authorizing Provider  acetaminophen (TYLENOL) 500 MG tablet Take 500 mg by mouth every 6 (six) hours as needed for pain (take 2 tablets prn).    Historical Provider, MD  allopurinol (ZYLOPRIM) 300 MG tablet Take 1 tablet (300 mg total) by mouth daily. 05/04/13   Neena Rhymes, MD  aspirin 81 MG tablet Take 81 mg by mouth daily.      Historical Provider, MD  b complex vitamins capsule Take 1 capsule by mouth daily.    Historical Provider, MD  Cetirizine HCl (ZYRTEC PO) Take 1 tablet by mouth as needed. Pt alternates with Allegra 180 mg prn    Historical Provider, MD  Cholecalciferol (VITAMIN D3) 2000 UNITS capsule Take 1 capsule (2,000 Units total) by mouth daily. 11/02/12   Neena Rhymes, MD  Coenzyme Q-10 100 MG capsule Take 300 mg by mouth daily.     Historical Provider, MD  colchicine 0.6  MG tablet Take 0.6 mg by mouth as needed (2 tabs followed by 1 tab 2 hours later-repeat next day if pain persists.).  05/08/13   Neena Rhymes, MD  Cyanocobalamin (VITAMIN B 12 PO) Take by mouth.      Historical Provider, MD  fexofenadine (ALLEGRA) 180 MG tablet Take 180 mg by mouth as needed for allergies or rhinitis. Alternates with Zyrtec    Historical Provider, MD  ibuprofen (ADVIL,MOTRIN) 200 MG tablet Take 200 mg by mouth every 6 (six) hours as needed for pain.    Historical Provider, MD  ketoconazole (NIZORAL) 2 % cream Apply 1 application topically daily. 05/16/14   Trula Slade, DPM  Multiple Vitamins-Minerals (CENTRUM SILVER ULTRA MENS PO) Take 1 capsule by mouth daily.    Historical Provider, MD  Omega-3 Fatty Acids (FISH OIL) 600 MG CAPS Take by mouth.    Historical Provider, MD  tamsulosin (FLOMAX) 0.4 MG CAPS capsule  Take 0.4 mg by mouth daily. 11/13/13   Historical Provider, MD   BP 130/45 mmHg  Pulse 56  Temp(Src) 97.8 F (36.6 C) (Oral)  Resp 18  Ht 5\' 7"  (1.702 m)  Wt 201 lb (91.173 kg)  BMI 31.47 kg/m2  SpO2 100% Physical Exam  Constitutional: He is oriented to person, place, and time. He appears well-developed and well-nourished.  HENT:  Head: Normocephalic.  Right Ear: External ear normal.  Left Ear: External ear normal.  Nose: Nose normal.  Mouth/Throat: Oropharynx is clear and moist.  Eyes: Conjunctivae and EOM are normal. Pupils are equal, round, and reactive to light.  Neck: Normal range of motion. Neck supple.  Cardiovascular: Normal rate, regular rhythm and normal heart sounds.   Pulmonary/Chest: Effort normal and breath sounds normal.  Abdominal: Soft. Bowel sounds are normal.  Musculoskeletal: He exhibits edema. He exhibits no tenderness.  Bilateral lower extremity pitting edema.    Neurological: He is alert and oriented to person, place, and time.  Skin: Skin is warm and dry.  Psychiatric: He has a normal mood and affect. His behavior is normal. Thought content normal.  Vitals reviewed.   ED Course  Procedures (including critical care time) Labs Review Labs Reviewed - No data to display  Imaging Review No results found.   EKG Interpretation   Date/Time:  Friday May 25 2014 10:43:39 EST Ventricular Rate:  44 PR Interval:    QRS Duration: 110 QT Interval:  472 QTC Calculation: 403 R Axis:   -44 Text Interpretation: Critical Test Result: AV Block Sinus bradycardia  with 2nd degree A-V block (Mobitz I) Left axis deviation Abnormal ECG  Confirmed by Kamarri Fischetti MD, Dora Clauss ([id number]) on 05/25/2014 10:49:38 AM      MDM   Final diagnoses:  Atrioventricular block, Mobitz type 1, Wenckebach  Peripheral edema    81 y.o. Male with new onset peripheral edema and new Wenkebach.  Patient normotensive, no syncope or lightheadedness.  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  Discussed with Dr. Martinique and he will arrange op f/u.  No medications identified as iatrogenic source.  Discussed return precautions and patient voices understanding.  Of note, his heart rate has been this slow before with first degree block.     Pattricia Boss, MD 05/31/14 [id number]

## 2014-05-25 NOTE — ED Notes (Signed)
Spoke to Dr Jeanell Sparrow - she reports I will receive a phone call regarding exact details for patient f/u - updated patient, who states he has to leave to prepare lunch for wife. EDP aware. Awaiting phone call.

## 2014-05-25 NOTE — ED Notes (Addendum)
Appointment for Thursday at 1pm for Cardio given to patient per EDP per Secretary who scheduled it. EDP informed me that she wanted patient to have an Echo. Pt unsure if this appointment was appropiate being that it was already scheduled. Spoke with Dr Jeanell Sparrow who states she is waiting on a call back for the exact appointment. Instructed pt to remain in ED Consult Room until further instructions received.

## 2014-05-25 NOTE — ED Notes (Signed)
Spoke to Dr Jeanell Sparrow regarding following up with patient for potential echo - she now states to have patient f/u with his cardiologist to have echo arranged. Called patient at [phone] and made him aware.

## 2014-05-25 NOTE — ED Notes (Signed)
Pt c/o pitting edema to his lower extremities x5 days. Pt called his PCP but cannot be seen until June 2.

## 2014-05-25 NOTE — ED Notes (Signed)
Patient transported to Ultrasound 

## 2014-05-25 NOTE — ED Notes (Signed)
Patient appt is at 1 pm Thursday

## 2014-05-25 NOTE — Telephone Encounter (Signed)
Closed encounter °

## 2014-05-28 ENCOUNTER — Other Ambulatory Visit: Payer: Self-pay | Admitting: *Deleted

## 2014-05-28 ENCOUNTER — Telehealth (HOSPITAL_COMMUNITY): Payer: Self-pay | Admitting: *Deleted

## 2014-05-28 ENCOUNTER — Encounter: Payer: Self-pay | Admitting: Cardiology

## 2014-05-28 DIAGNOSIS — I341 Nonrheumatic mitral (valve) prolapse: Secondary | ICD-10-CM

## 2014-05-28 NOTE — Telephone Encounter (Signed)
Pt was in the ER and was instructed to have an echo before seeing Dr. Percival Spanish on 3/17. Does Dr. Percival Spanish want him to have the echo done before his visit? If so please put order in epic so that I can schedule it

## 2014-05-28 NOTE — Telephone Encounter (Signed)
Echo order placed.

## 2014-05-30 ENCOUNTER — Ambulatory Visit (HOSPITAL_COMMUNITY)
Admission: RE | Admit: 2014-05-30 | Discharge: 2014-05-30 | Disposition: A | Discharge status: Home / Self Care | Payer: Medicare Other | Source: Ambulatory Visit | Attending: Internal Medicine | Admitting: Internal Medicine

## 2014-05-30 DIAGNOSIS — I341 Nonrheumatic mitral (valve) prolapse: Secondary | ICD-10-CM | POA: Diagnosis present

## 2014-05-30 DIAGNOSIS — I059 Rheumatic mitral valve disease, unspecified: Secondary | ICD-10-CM

## 2014-05-30 NOTE — Progress Notes (Signed)
2D Echo Performed 05/30/2014    Gaylan Fauver, RCS  

## 2014-05-31 ENCOUNTER — Encounter: Payer: Self-pay | Admitting: Cardiology

## 2014-05-31 ENCOUNTER — Ambulatory Visit (INDEPENDENT_AMBULATORY_CARE_PROVIDER_SITE_OTHER): Payer: Medicare Other | Admitting: Cardiology

## 2014-05-31 VITALS — BP 126/56 | Ht 67.0 in | Wt 194.0 lb

## 2014-05-31 DIAGNOSIS — R600 Localized edema: Secondary | ICD-10-CM

## 2014-05-31 NOTE — Patient Instructions (Signed)
Your physician recommends that you schedule a follow-up appointment in: May with Dr. Percival Spanish  Take your lasix as needed

## 2014-05-31 NOTE — Progress Notes (Signed)
HPI The patient presents for followup after an ER visit a couple of days ago. He had some lower extremity swelling. He was seen in the emergency room and did have venous Dopplers which demonstrated no DVTs. He was given some Lasix today. He did get an echocardiogram and I reviewed this. He has a well preserved ejection fraction with some mild to moderate mitral regurgitation. He does have tricuspid regurgitation and some elevated pulmonary pressures. He was also noted to have his usual bradycardia but with Mobitz type I. He says that his swelling is improved. He had been spending excessive amount of time with his feet on the ground as he was working on the computer. He's not been having any new shortness of breath, PND or orthopnea. He's not been having any chest pressure, neck or arm discomfort. He otherwise feels well and wants to get back to exercising.   Allergies  Allergen Reactions  . Prednisone     REACTION: GI bleed  . Sulfonamide Derivatives     REACTION: Rash    Current Outpatient Prescriptions  Medication Sig Dispense Refill  . acetaminophen (TYLENOL) 500 MG tablet Take 500 mg by mouth every 6 (six) hours as needed for pain (take 2 tablets prn).    Marland Kitchen allopurinol (ZYLOPRIM) 300 MG tablet Take 1 tablet (300 mg total) by mouth daily. 90 tablet 3  . aspirin 81 MG tablet Take 81 mg by mouth daily.      Marland Kitchen b complex vitamins capsule Take 1 capsule by mouth daily.    . Cetirizine HCl (ZYRTEC PO) Take 1 tablet by mouth as needed. Pt alternates with Allegra 180 mg prn    . Cholecalciferol (VITAMIN D3) 2000 UNITS capsule Take 1 capsule (2,000 Units total) by mouth daily.    . Coenzyme Q-10 100 MG capsule Take 300 mg by mouth daily.     . colchicine 0.6 MG tablet Take 0.6 mg by mouth as needed (2 tabs followed by 1 tab 2 hours later-repeat next day if pain persists.).     Marland Kitchen Cyanocobalamin (VITAMIN B 12 PO) Take by mouth.      . fexofenadine (ALLEGRA) 180 MG tablet Take 180 mg by mouth as  needed for allergies or rhinitis. Alternates with Zyrtec    . furosemide (LASIX) 40 MG tablet Take 1 tablet (40 mg total) by mouth daily. 30 tablet 0  . ibuprofen (ADVIL,MOTRIN) 200 MG tablet Take 200 mg by mouth every 6 (six) hours as needed for pain.    . Omega-3 Fatty Acids (FISH OIL) 600 MG CAPS Take by mouth.    . tamsulosin (FLOMAX) 0.4 MG CAPS capsule Take 0.4 mg by mouth daily.  11   No current facility-administered medications for this visit.    Past Medical History  Diagnosis Date  . Benign prostatic hypertrophy     hx of  . Other and unspecified hyperlipidemia   . Personal history of unspecified disorders of nervous system and sense organs   . Mitral valve disorders   . Unspecified sleep apnea   . Morbid obesity   . Personal history of urinary calculi   . Diverticulosis of colon (without mention of hemorrhage)   . Other thalassemia   . First degree atrioventricular block   . Gout   . Osteoarthritis   . Basal cell carcinoma   . Anemia   . GERD (gastroesophageal reflux disease)   . Peptic ulcer with hemorrhage     duodenum  . Blood transfusion without  reported diagnosis 1956    gastrectomy  . Colon polyp 2006, 2010    TUBULAR ADENOMA    Past Surgical History  Procedure Laterality Date  . Tumor removed Left 2002    from finger- left index  . Moles removed  2008  . Cataract extraction, bilateral  [id number]    hx of  . Gastrectomy      hx of '56. Transfused 7 pints blood '55, 4 pints '56  . Colonoscopy  2015    multiple   . Esophagogastroduodenoscopy  2011    multiple    ROS:  As stated in the HPI and negative for all other systems.  PHYSICAL EXAM BP 126/56 mmHg  Ht 5\' 7"  (1.702 m)  Wt 194 lb (87.998 kg)  BMI 30.38 kg/m2 GENERAL:  Well appearing NECK:  No jugular venous distention, waveform within normal limits, carotid upstroke brisk and symmetric, no bruits, no thyromegaly LUNGS:  Clear to auscultation bilaterally CHEST:  Unremarkable HEART:  PMI  not displaced or sustained,S1 and S2 within normal limits, no S3, no S4, no clicks, no rubs, no murmurs ABD:  Flat, positive bowel sounds normal in frequency in pitch, no bruits, no rebound, no guarding, no midline pulsatile mass, no hepatomegaly, no splenomegaly, obese EXT:  2 plus pulses throughout, no edema, no cyanosis no clubbing NEURO:  Nonfocal   ASSESSMENT AND PLAN  ABNORMAL STRESS TEST - The patient had this before.  This was read as moderate risk with a small inferior defect. However, he did have a defect on a previous study in 2007. His EF was normal. He has absolutely no symptoms and a very active functional level. Therefore, no further cardiovascular testing is suggested. He needs continued risk reduction.    MORBID OBESITY, HX OF -  He has had about a 70 pound weight loss overall. I congratulated him on this.  BRADYCARDIA - He has no symptoms related to this. He's had no presyncope or syncope. He has chronotropic competence. We have discussed the symptoms that could precipitate the need for a pacemaker. He will let me know if he has symptoms going forward. For now he will continue the meds as listed.   EDEMA - At this point I think this is mostly related to having his legs down. He does have some elevated pulmonary pressures it is mild has TR which probably contributes. However, we are going to continue with conservative therapies. He will take his Lasix however when necessary. No further workup is planned.   Outpatient records reviewed. Echocardiogram results reviewed.

## 2014-06-25 ENCOUNTER — Other Ambulatory Visit: Payer: Self-pay | Admitting: *Deleted

## 2014-06-25 ENCOUNTER — Telehealth: Payer: Self-pay | Admitting: Cardiology

## 2014-06-25 MED ORDER — FUROSEMIDE 40 MG PO TABS: 40.0000 mg | ORAL_TABLET | Freq: Every day | ORAL | Status: DC

## 2014-06-25 NOTE — Telephone Encounter (Signed)
Pt would like the name of the place in [place] where you can get support hose please.

## 2014-06-25 NOTE — Telephone Encounter (Signed)
Left message for patient on private voicemail of [employer]. Placed a flyer from the company in the mail to the patient.

## 2014-06-25 NOTE — Telephone Encounter (Signed)
°  1. Which medications need to be refilled? Furosemide  2. Which pharmacy is medication to be sent to?[facility]-[phone]  3. Do they need a 30 day or 90 day supply? 30 l  4. Would they like a call back once the medication has been sent to the pharmacy? yes

## 2014-07-12 ENCOUNTER — Telehealth: Payer: Self-pay | Admitting: Cardiology

## 2014-07-12 NOTE — Telephone Encounter (Signed)
New Prob    Pts HR is running low but says this is baseline. Pt is also having dizziness which has worsened in last week. No evidence of edema. Representative is requesting to speak to a nurse. Please call.

## 2014-07-12 NOTE — Telephone Encounter (Signed)
Called no answer left message to call me back

## 2014-07-13 ENCOUNTER — Telehealth: Payer: Self-pay | Admitting: Cardiology

## 2014-07-13 NOTE — Telephone Encounter (Signed)
I have spoken this pt. At length and Dr. Percival Spanish will see him on may 17th

## 2014-07-13 NOTE — Telephone Encounter (Signed)
Pt was told to call Dr Warren Lacy by the NP yesterday. She is concerned about Edema and missing heart beats and low pulse rate.

## 2014-07-27 ENCOUNTER — Ambulatory Visit: Payer: Medicare Other | Admitting: Cardiology

## 2014-07-31 ENCOUNTER — Ambulatory Visit (INDEPENDENT_AMBULATORY_CARE_PROVIDER_SITE_OTHER): Payer: Medicare Other | Admitting: Cardiology

## 2014-07-31 ENCOUNTER — Encounter: Payer: Self-pay | Admitting: Cardiology

## 2014-07-31 VITALS — BP 116/52 | HR 41 | Ht 67.0 in | Wt 201.5 lb

## 2014-07-31 DIAGNOSIS — R531 Weakness: Secondary | ICD-10-CM | POA: Diagnosis not present

## 2014-07-31 DIAGNOSIS — I059 Rheumatic mitral valve disease, unspecified: Secondary | ICD-10-CM | POA: Diagnosis not present

## 2014-07-31 NOTE — Patient Instructions (Signed)
Your physician recommends that you schedule a follow-up appointment in: 2 months with Dr. Percival Spanish  We are ordering a lab test for you to get done  We are getting a EP evaluation for Bradycardia

## 2014-07-31 NOTE — Progress Notes (Addendum)
HPI The patient presents for followup of his slow heart rate.   As previously described he was seen in March in the emergency department. He had some lower extremity swelling. He had venous Dopplers which demonstrated no DVTs. He did get an echocardiogram with a well preserved ejection fraction with some mild to moderate mitral regurgitation. He does have tricuspid regurgitation and some elevated pulmonary pressures. He was also noted to have his usual bradycardia but with Mobitz type I.   He was doing relatively well when I saw him previously. However, since then he's had what he thinks are progressively slower heart rates. His heart rates in the 30s. He's had more dizziness. He's not had any frank syncope. He doesn't feel as well though he still does some exercising. He says he has felt his heart rate increase with exercise but at rest is very slow. He is more fatigued. He's concerned because he had weight gain from 1 day to the next 6 pounds despite having good bowel movements. He's continued to have lower externally swelling which seems to be at baseline but not better than previous. He's not describing any new PND or orthopnea. He's not describing any chest pressure, neck or arm discomfort.  Allergies  Allergen Reactions  . Prednisone     REACTION: GI bleed  . Sulfonamide Derivatives     REACTION: Rash    Current Outpatient Prescriptions  Medication Sig Dispense Refill  . acetaminophen (TYLENOL) 500 MG tablet Take 500 mg by mouth every 6 (six) hours as needed for pain (take 2 tablets prn).    Marland Kitchen allopurinol (ZYLOPRIM) 300 MG tablet Take 1 tablet (300 mg total) by mouth daily. 90 tablet 3  . aspirin 81 MG tablet Take 81 mg by mouth daily.      Marland Kitchen b complex vitamins capsule Take 1 capsule by mouth daily.    . Cetirizine HCl (ZYRTEC PO) Take 1 tablet by mouth as needed. Pt alternates with Allegra 180 mg prn    . Cholecalciferol (VITAMIN D3) 2000 UNITS capsule Take 1 capsule (2,000 Units  total) by mouth daily.    . Coenzyme Q-10 100 MG capsule Take 300 mg by mouth daily.     . colchicine 0.6 MG tablet Take 0.6 mg by mouth as needed (2 tabs followed by 1 tab 2 hours later-repeat next day if pain persists.).     Marland Kitchen Cyanocobalamin (VITAMIN B 12 PO) Take by mouth.      . fexofenadine (ALLEGRA) 180 MG tablet Take 180 mg by mouth as needed for allergies or rhinitis. Alternates with Zyrtec    . FOLIC ACID PO Take 284 mg by mouth daily.    . furosemide (LASIX) 40 MG tablet Take 1 tablet (40 mg total) by mouth daily. 30 tablet 6  . ibuprofen (ADVIL,MOTRIN) 200 MG tablet Take 200 mg by mouth every 6 (six) hours as needed for pain.    . Omega-3 Fatty Acids (FISH OIL) 600 MG CAPS Take by mouth.    . tamsulosin (FLOMAX) 0.4 MG CAPS capsule Take 0.4 mg by mouth daily.  11   No current facility-administered medications for this visit.    Past Medical History  Diagnosis Date  . Benign prostatic hypertrophy     hx of  . Other and unspecified hyperlipidemia   . Personal history of unspecified disorders of nervous system and sense organs   . Mitral valve disorders   . Unspecified sleep apnea   . Morbid obesity   .  Personal history of urinary calculi   . Diverticulosis of colon (without mention of hemorrhage)   . Other thalassemia   . First degree atrioventricular block   . Gout   . Osteoarthritis   . Basal cell carcinoma   . Anemia   . GERD (gastroesophageal reflux disease)   . Peptic ulcer with hemorrhage     duodenum  . Blood transfusion without reported diagnosis 1956    gastrectomy  . Colon polyp 2006, 2010    TUBULAR ADENOMA    Past Surgical History  Procedure Laterality Date  . Tumor removed Left 2002    from finger- left index  . Moles removed  2008  . Cataract extraction, bilateral  [id number]    hx of  . Gastrectomy      hx of '56. Transfused 7 pints blood '55, 4 pints '56  . Colonoscopy  2015    multiple   . Esophagogastroduodenoscopy  2011    multiple     ROS:  As stated in the HPI and negative for all other systems.  PHYSICAL EXAM BP 116/52 mmHg  Pulse 41  Ht 5\' 7"  (1.702 m)  Wt 201 lb 8 oz (91.4 kg)  BMI 31.55 kg/m2 GENERAL:  Well appearing NECK:  No jugular venous distention, waveform within normal limits, carotid upstroke brisk and symmetric, no bruits, no thyromegaly LUNGS:  Clear to auscultation bilaterally CHEST:  Unremarkable HEART:  PMI not displaced or sustained,S1 and S2 within normal limits, no S3, no S4, no clicks, no rubs, no murmurs ABD:  Flat, positive bowel sounds normal in frequency in pitch, no bruits, no rebound, no guarding, no midline pulsatile mass, no hepatomegaly, no splenomegaly, obese EXT:  2 plus pulses throughout, no edema, no cyanosis no clubbing NEURO:  Nonfocal  EKG:  Sinus rhythm, rate 58, 2-1 heart block, poor anterior R wave progression, left axis deviation. 07/31/2014   ASSESSMENT AND PLAN  ABNORMAL STRESS TEST - The patient had this before.  This was read as moderate risk with a small inferior defect. However, he did have a defect on a previous study in 2007. His EF was normal. We are continuing to manage this medically.  BRADYCARDIA - He has increasing symptoms of dizziness. He's had significant first degree AV block and documented 25. Today he's found to have 2-1 block. He does report probable chronotropic response to exercise. However, I think he is now symptomatic with his progressive bradycardia arrhythmia and will refer him to electrophysiology for consideration of a pacemaker. I will also be checking a TSH.  EDEMA - He continues to complain of this. We are going to continue conservative measures. He does have some tricuspid regurgitation but I don't strongly suspect that this is contributing. He is convinced is related to his bradycardia arrhythmia. I will further assess after his EP evaluation. He has had a recent echocardiogram as described.  MR - This was mild to moderate in the past.  No change in therapy is indicated.

## 2014-08-01 LAB — TSH: TSH: 3.373 u[IU]/mL (ref 0.350–4.500)

## 2014-08-07 ENCOUNTER — Ambulatory Visit (INDEPENDENT_AMBULATORY_CARE_PROVIDER_SITE_OTHER): Payer: Medicare Other | Admitting: Internal Medicine

## 2014-08-07 ENCOUNTER — Encounter: Payer: Self-pay | Admitting: Internal Medicine

## 2014-08-07 VITALS — BP 128/58 | HR 49 | Ht 67.0 in | Wt 199.8 lb

## 2014-08-07 DIAGNOSIS — I442 Atrioventricular block, complete: Secondary | ICD-10-CM

## 2014-08-07 DIAGNOSIS — I10 Essential (primary) hypertension: Secondary | ICD-10-CM

## 2014-08-07 DIAGNOSIS — Z01812 Encounter for preprocedural laboratory examination: Secondary | ICD-10-CM | POA: Diagnosis not present

## 2014-08-07 DIAGNOSIS — R001 Bradycardia, unspecified: Secondary | ICD-10-CM

## 2014-08-07 DIAGNOSIS — R002 Palpitations: Secondary | ICD-10-CM

## 2014-08-07 NOTE — Assessment & Plan Note (Signed)
The patient's last ECG demonstrated complete heart block and a narrow qrs. We discussed the implications of pacing. I have notified him that he could hold off on pacing but because he has symptomatic bradycardia at times, and I suspect return of conduction at other times, he would like to proceed with PPM insertion. The risks/benefits/goals/expectations of the procedure have been discussed with the patient and he wishes to proceed with PPM.

## 2014-08-07 NOTE — Progress Notes (Signed)
HPI Jacob [name] is referred today by Jacob [name] for evaluation of complete heart block. The patient is a very pleasant 81 yo man with a h/o bradycardia but appropriate heart rate response with exercise. He has never had syncope but he does note periods of dizziness. He has been on no AV nodal blocking drugs. He saw Jacob [name] several weeks ago and was in complete heart block and dizzy. He returns for additional evaluation. He has had minimal fatigue with exertion. Allergies  Allergen Reactions  . Prednisone     REACTION: GI bleed  . Sulfonamide Derivatives     REACTION: Rash     Current Outpatient Prescriptions  Medication Sig Dispense Refill  . acetaminophen (TYLENOL) 500 MG tablet Take 500-1,000 mg by mouth every 6 (six) hours as needed (pain).     Marland Kitchen allopurinol (ZYLOPRIM) 300 MG tablet Take 1 tablet (300 mg total) by mouth daily. 90 tablet 3  . aspirin 81 MG tablet Take 81 mg by mouth daily.      Marland Kitchen b complex vitamins capsule Take 1 capsule by mouth daily.    . Cetirizine HCl (ZYRTEC PO) Take 1 tablet by mouth daily as needed (allergies). Pt alternates with Allegra 180 mg prn    . Cholecalciferol (VITAMIN D3) 2000 UNITS capsule Take 1 capsule (2,000 Units total) by mouth daily.    . Coenzyme Q-10 100 MG capsule Take 300 mg by mouth daily.     . colchicine 0.6 MG tablet Take 0.6 mg by mouth daily as needed (2 tabs followed by 1 tab 2 hours later-repeat next day if pain persists.).     Marland Kitchen Cyanocobalamin (VITAMIN B 12 PO) Take by mouth.      . fexofenadine (ALLEGRA) 180 MG tablet Take 180 mg by mouth as needed for allergies or rhinitis. Alternates with Zyrtec    . FOLIC ACID PO Take 545 mg by mouth daily.    . furosemide (LASIX) 40 MG tablet Take 1 tablet (40 mg total) by mouth daily. 30 tablet 6  . ibuprofen (ADVIL,MOTRIN) 200 MG tablet Take 200 mg by mouth every 6 (six) hours as needed for pain.    . Omega-3 Fatty Acids (FISH OIL) 600 MG CAPS Take by mouth.    . tamsulosin  (FLOMAX) 0.4 MG CAPS capsule Take 0.4 mg by mouth daily.  11   No current facility-administered medications for this visit.     Past Medical History  Diagnosis Date  . Benign prostatic hypertrophy     hx of  . Other and unspecified hyperlipidemia   . Personal history of unspecified disorders of nervous system and sense organs   . Mitral valve disorders   . Unspecified sleep apnea   . Morbid obesity   . Personal history of urinary calculi   . Diverticulosis of colon (without mention of hemorrhage)   . Other thalassemia   . First degree atrioventricular block   . Gout   . Osteoarthritis   . Basal cell carcinoma   . Anemia   . GERD (gastroesophageal reflux disease)   . Peptic ulcer with hemorrhage     duodenum  . Blood transfusion without reported diagnosis 1956    gastrectomy  . Colon polyp 2006, 2010    TUBULAR ADENOMA    ROS:   All systems reviewed and negative except as noted in the HPI.   Past Surgical History  Procedure Laterality Date  . Tumor removed Left 2002    from  finger- left index  . Moles removed  2008  . Cataract extraction, bilateral  [id number]    hx of  . Gastrectomy      hx of '56. Transfused 7 pints blood '55, 4 pints '56  . Colonoscopy  2015    multiple   . Esophagogastroduodenoscopy  2011    multiple     Family History  Problem Relation Age of Onset  . Coronary artery disease Father   . Heart attack Father   . Gout Father   . Cancer Daughter     skin  . Colon cancer Neg Hx      History   Social History  . Marital Status: Married    Spouse Name: ANn  . Number of Children: 8  . Years of Education: 18+   Occupational History  . executive     retired   Social History Main Topics  . Smoking status: Former Smoker    Start date: 01/21/1970  . Smokeless tobacco: Never Used     Comment: quite in 1971  . Alcohol Use: No  . Drug Use: No  . Sexual Activity:    Partners: Female   Other Topics Concern  . 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 Married '59. Eight Daughters '60, '62, '66, '70, '73, '74, '78, '83; 19 grandchildren. Lives independently with wife. ACP- asked pt to think about these issues (DEc '12)           BP 128/58 mmHg  Pulse 49  Ht 5\' 7"  (1.702 m)  Wt 199 lb 12.8 oz (90.629 kg)  BMI 31.29 kg/m2  SpO2 99%  Physical Exam:  Well appearing 81 yo man, NAD HEENT: Unremarkable Neck:  No JVD, no thyromegally Back:  No CVA tenderness Lungs:  Clear with no wheezes HEART:  Regular rate rhythm, no murmurs, no rubs, no clicks Abd:  soft, positive bowel sounds, no organomegally, no rebound, no guarding Ext:  2 plus pulses, no edema, no cyanosis, no clubbing Skin:  No rashes no nodules Neuro:  CN II through XII intact, motor grossly intact  EKG - nsr with complete heart block   Assess/Plan:

## 2014-08-07 NOTE — Assessment & Plan Note (Signed)
His blood pressure is well controlled today. He will continue his current meds. 

## 2014-08-07 NOTE — Patient Instructions (Addendum)
Medication Instructions:  Your physician recommends that you continue on your current medications as directed. Please refer to the Current Medication list given to you today.   Labwork: Your physician recommends that you return for lab work in: 6/13  (CBC, BMP)  Testing/Procedures: NONE  Follow-Up: Your physician recommends that you schedule a follow-up appointment in: 7-10 days from 6/22 in [facility] for a Wound Check.  I will call you with details related to your procedure and will mail you a letter.   Any Other Special Instructions Will Be Listed Below (If Applicable).

## 2014-08-07 NOTE — Assessment & Plan Note (Signed)
He is symptomatic and I suspect these symptoms are related to his intermittent AV conduction.

## 2014-08-15 ENCOUNTER — Institutional Professional Consult (permissible substitution): Payer: Medicare Other | Admitting: Internal Medicine

## 2014-08-28 LAB — BASIC METABOLIC PANEL
BUN: 32 mg/dL — AB (ref 6–23)
CO2: 27 meq/L (ref 19–32)
Calcium: 9.6 mg/dL (ref 8.4–10.5)
Chloride: 103 mEq/L (ref 96–112)
Creat: 0.96 mg/dL (ref 0.50–1.35)
Glucose, Bld: 70 mg/dL (ref 70–99)
POTASSIUM: 4.6 meq/L (ref 3.5–5.3)
Sodium: 138 mEq/L (ref 135–145)

## 2014-08-28 LAB — CBC WITH DIFFERENTIAL/PLATELET
Basophils Absolute: 0.1 10*3/uL (ref 0.0–0.1)
Basophils Relative: 1 % (ref 0–1)
EOS ABS: 0.2 10*3/uL (ref 0.0–0.7)
Eosinophils Relative: 3 % (ref 0–5)
HEMATOCRIT: 36.5 % — AB (ref 39.0–52.0)
Hemoglobin: 11.3 g/dL — ABNORMAL LOW (ref 13.0–17.0)
LYMPHS ABS: 2 10*3/uL (ref 0.7–4.0)
LYMPHS PCT: 33 % (ref 12–46)
MCH: 18.2 pg — ABNORMAL LOW (ref 26.0–34.0)
MCHC: 31 g/dL (ref 30.0–36.0)
MCV: 58.9 fL — ABNORMAL LOW (ref 78.0–100.0)
MONOS PCT: 9 % (ref 3–12)
Monocytes Absolute: 0.5 10*3/uL (ref 0.1–1.0)
Neutro Abs: 3.3 10*3/uL (ref 1.7–7.7)
Neutrophils Relative %: 54 % (ref 43–77)
Platelets: 215 10*3/uL (ref 150–400)
RBC: 6.2 MIL/uL — AB (ref 4.22–5.81)
RDW: 20.8 % — ABNORMAL HIGH (ref 11.5–15.5)
WBC: 6.1 10*3/uL (ref 4.0–10.5)

## 2014-08-30 ENCOUNTER — Telehealth: Payer: Self-pay | Admitting: Cardiology

## 2014-08-30 NOTE — Telephone Encounter (Signed)
Received records from [facility] for appointment with Dr Percival Spanish on 09/26/14.  Records given to [facility] (medical records) for Dr Hochrein's schedule on 09/26/14. lp

## 2014-09-04 ENCOUNTER — Telehealth: Payer: Self-pay | Admitting: Internal Medicine

## 2014-09-04 MED ORDER — SODIUM CHLORIDE 0.9 % IR SOLN
80.0000 mg | Status: DC
  Filled 2014-09-04: qty 2

## 2014-09-04 MED ORDER — CEFAZOLIN SODIUM-DEXTROSE 2-3 GM-% IV SOLR: 2.0000 g | INTRAVENOUS | Status: DC

## 2014-09-04 MED ORDER — SODIUM CHLORIDE 0.9 % IV SOLN
INTRAVENOUS | Status: DC
  Administered 2014-09-05: 11:00:00 via INTRAVENOUS

## 2014-09-04 NOTE — Telephone Encounter (Signed)
New message      Need clarifications on instructions on upcoming procedure----regarding eating or not eating prior to procedure

## 2014-09-04 NOTE — Telephone Encounter (Signed)
NPO after midnight  He verbalized understanding

## 2014-09-05 ENCOUNTER — Ambulatory Visit (HOSPITAL_COMMUNITY)
Admission: RE | Admit: 2014-09-05 | Discharge: 2014-09-06 | Disposition: A | Discharge status: Home / Self Care | Payer: Medicare Other | Source: Ambulatory Visit | Attending: Internal Medicine | Admitting: Internal Medicine

## 2014-09-05 ENCOUNTER — Encounter (HOSPITAL_COMMUNITY): Payer: Self-pay | Admitting: Internal Medicine

## 2014-09-05 ENCOUNTER — Encounter (HOSPITAL_COMMUNITY)
Admission: RE | Disposition: A | Discharge status: Home / Self Care | Payer: Medicare Other | Source: Ambulatory Visit | Attending: Internal Medicine

## 2014-09-05 DIAGNOSIS — Z6831 Body mass index (BMI) 31.0-31.9, adult: Secondary | ICD-10-CM | POA: Insufficient documentation

## 2014-09-05 DIAGNOSIS — Z7982 Long term (current) use of aspirin: Secondary | ICD-10-CM | POA: Insufficient documentation

## 2014-09-05 DIAGNOSIS — N400 Enlarged prostate without lower urinary tract symptoms: Secondary | ICD-10-CM | POA: Diagnosis not present

## 2014-09-05 DIAGNOSIS — R001 Bradycardia, unspecified: Secondary | ICD-10-CM | POA: Diagnosis not present

## 2014-09-05 DIAGNOSIS — Z950 Presence of cardiac pacemaker: Secondary | ICD-10-CM | POA: Diagnosis not present

## 2014-09-05 DIAGNOSIS — E785 Hyperlipidemia, unspecified: Secondary | ICD-10-CM | POA: Diagnosis not present

## 2014-09-05 DIAGNOSIS — Z87891 Personal history of nicotine dependence: Secondary | ICD-10-CM | POA: Insufficient documentation

## 2014-09-05 DIAGNOSIS — G4730 Sleep apnea, unspecified: Secondary | ICD-10-CM | POA: Diagnosis not present

## 2014-09-05 DIAGNOSIS — I442 Atrioventricular block, complete: Secondary | ICD-10-CM | POA: Diagnosis not present

## 2014-09-05 HISTORY — DX: Presence of cardiac pacemaker: Z95.0

## 2014-09-05 HISTORY — DX: Pneumonia, unspecified organism: J18.9

## 2014-09-05 HISTORY — DX: Obstructive sleep apnea (adult) (pediatric): G47.33

## 2014-09-05 HISTORY — DX: Personal history of other medical treatment: Z92.89

## 2014-09-05 HISTORY — PX: INSERT / REPLACE / REMOVE PACEMAKER: SUR710

## 2014-09-05 HISTORY — PX: EP IMPLANTABLE DEVICE: SHX172B

## 2014-09-05 HISTORY — DX: Nonrheumatic mitral (valve) prolapse: I34.1

## 2014-09-05 HISTORY — DX: Personal history of other diseases of the digestive system: Z87.19

## 2014-09-05 HISTORY — DX: Calculus of kidney: N20.0

## 2014-09-05 HISTORY — DX: Unspecified osteoarthritis, unspecified site: M19.90

## 2014-09-05 HISTORY — DX: Malignant neoplasm of prostate: C61

## 2014-09-05 LAB — SURGICAL PCR SCREEN
MRSA, PCR: POSITIVE — AB
STAPHYLOCOCCUS AUREUS: POSITIVE — AB

## 2014-09-05 SURGERY — PACEMAKER IMPLANT
Anesthesia: LOCAL

## 2014-09-05 MED ORDER — SODIUM CHLORIDE 0.9 % IR SOLN
Status: AC
  Filled 2014-09-05: qty 2

## 2014-09-05 MED ORDER — COLCHICINE 0.6 MG PO TABS: 0.6000 mg | ORAL_TABLET | Freq: Every day | ORAL | Status: DC | PRN

## 2014-09-05 MED ORDER — ALLOPURINOL 300 MG PO TABS
300.0000 mg | ORAL_TABLET | Freq: Every day | ORAL | Status: DC
  Administered 2014-09-05 – 2014-09-06 (×2): 300 mg via ORAL
  Filled 2014-09-05 (×2): qty 1

## 2014-09-05 MED ORDER — MIDAZOLAM HCL 5 MG/5ML IJ SOLN
INTRAMUSCULAR | Status: AC
  Filled 2014-09-05: qty 5

## 2014-09-05 MED ORDER — LIDOCAINE HCL (PF) 1 % IJ SOLN
INTRAMUSCULAR | Status: AC
  Filled 2014-09-05: qty 30

## 2014-09-05 MED ORDER — FENTANYL CITRATE (PF) 100 MCG/2ML IJ SOLN
INTRAMUSCULAR | Status: DC | PRN
  Administered 2014-09-05: 12.5 ug via INTRAVENOUS

## 2014-09-05 MED ORDER — FENTANYL CITRATE (PF) 100 MCG/2ML IJ SOLN
INTRAMUSCULAR | Status: AC
  Filled 2014-09-05: qty 2

## 2014-09-05 MED ORDER — CHLORHEXIDINE GLUCONATE 4 % EX LIQD
60.0000 mL | Freq: Once | CUTANEOUS | Status: DC
  Filled 2014-09-05: qty 60

## 2014-09-05 MED ORDER — FUROSEMIDE 40 MG PO TABS
40.0000 mg | ORAL_TABLET | Freq: Every day | ORAL | Status: DC
  Administered 2014-09-05 – 2014-09-06 (×2): 40 mg via ORAL
  Filled 2014-09-05 (×2): qty 1

## 2014-09-05 MED ORDER — MUPIROCIN 2 % EX OINT
TOPICAL_OINTMENT | Freq: Two times a day (BID) | CUTANEOUS | Status: DC
  Administered 2014-09-05: 1 via NASAL
  Filled 2014-09-05: qty 22

## 2014-09-05 MED ORDER — CEFAZOLIN SODIUM 1-5 GM-% IV SOLN
1.0000 g | Freq: Four times a day (QID) | INTRAVENOUS | Status: AC
Start: 2014-09-05 — End: 2014-09-06
  Administered 2014-09-05 – 2014-09-06 (×3): 1 g via INTRAVENOUS
  Filled 2014-09-05 (×3): qty 50

## 2014-09-05 MED ORDER — ACETAMINOPHEN 500 MG PO TABS: 500.0000 mg | ORAL_TABLET | Freq: Four times a day (QID) | ORAL | Status: DC | PRN

## 2014-09-05 MED ORDER — ONDANSETRON HCL 4 MG/2ML IJ SOLN
4.0000 mg | Freq: Four times a day (QID) | INTRAMUSCULAR | Status: DC | PRN
Start: 2014-09-05 — End: 2014-09-06

## 2014-09-05 MED ORDER — ASPIRIN EC 81 MG PO TBEC
81.0000 mg | DELAYED_RELEASE_TABLET | Freq: Every day | ORAL | Status: DC
  Administered 2014-09-05 – 2014-09-06 (×2): 81 mg via ORAL
  Filled 2014-09-05 (×3): qty 1

## 2014-09-05 MED ORDER — CEFAZOLIN SODIUM 1 G IJ SOLR
2.0000 g | INTRAMUSCULAR | Status: DC | PRN
  Administered 2014-09-05: 2 g via INTRAVENOUS

## 2014-09-05 MED ORDER — TAMSULOSIN HCL 0.4 MG PO CAPS
0.4000 mg | ORAL_CAPSULE | Freq: Every day | ORAL | Status: DC
  Administered 2014-09-05 – 2014-09-06 (×2): 0.4 mg via ORAL
  Filled 2014-09-05 (×2): qty 1

## 2014-09-05 MED ORDER — ACETAMINOPHEN 325 MG PO TABS: 325.0000 mg | ORAL_TABLET | ORAL | Status: DC | PRN

## 2014-09-05 MED ORDER — HEPARIN (PORCINE) IN NACL 2-0.9 UNIT/ML-% IJ SOLN
INTRAMUSCULAR | Status: AC
  Filled 2014-09-05: qty 500

## 2014-09-05 MED ORDER — CEFAZOLIN SODIUM-DEXTROSE 2-3 GM-% IV SOLR
INTRAVENOUS | Status: AC
  Filled 2014-09-05: qty 50

## 2014-09-05 MED ORDER — MIDAZOLAM HCL 5 MG/5ML IJ SOLN
INTRAMUSCULAR | Status: DC | PRN
  Administered 2014-09-05 (×2): 1 mg via INTRAVENOUS

## 2014-09-05 MED ORDER — LIDOCAINE HCL (PF) 1 % IJ SOLN
INTRAMUSCULAR | Status: DC | PRN
  Administered 2014-09-05: 30 mL via SUBCUTANEOUS

## 2014-09-05 MED ORDER — MUPIROCIN 2 % EX OINT
TOPICAL_OINTMENT | CUTANEOUS | Status: AC
  Filled 2014-09-05: qty 22

## 2014-09-05 SURGICAL SUPPLY — 7 items
CABLE SURGICAL S-101-97-12 (CABLE) ×2 IMPLANT
LEAD CAPSURE NOVUS 45CM (Lead) ×2 IMPLANT
LEAD CAPSURE NOVUS 5076-52CM (Lead) ×2 IMPLANT
PAD DEFIB LIFELINK (PAD) ×2 IMPLANT
PPM ADVISA MRI DR A2DR01 (Pacemaker) ×2 IMPLANT
SHEATH COOK PEEL AWAY SET 7F (SHEATH) ×4 IMPLANT
TRAY PACEMAKER INSERTION (CUSTOM PROCEDURE TRAY) ×2 IMPLANT

## 2014-09-05 NOTE — H&P (Signed)
HPI [name] is referred today by Dr. Percival Spanish for evaluation of complete heart block. The patient is a very pleasant 81 yo man with a h/o bradycardia but appropriate heart rate response with exercise. He has never had syncope but he does note periods of dizziness. He has been on no AV nodal blocking drugs. He saw Dr. Lenore Cordia several weeks ago and was in complete heart block and dizzy. He returns for additional evaluation. He has had minimal fatigue with exertion. Allergies  Allergen Reactions  . Prednisone     REACTION: GI bleed  . Sulfonamide Derivatives     REACTION: Rash     Current Outpatient Prescriptions  Medication Sig Dispense Refill  . acetaminophen (TYLENOL) 500 MG tablet Take 500-1,000 mg by mouth every 6 (six) hours as needed (pain).     Marland Kitchen allopurinol (ZYLOPRIM) 300 MG tablet Take 1 tablet (300 mg total) by mouth daily. 90 tablet 3  . aspirin 81 MG tablet Take 81 mg by mouth daily.     Marland Kitchen b complex vitamins capsule Take 1 capsule by mouth daily.    . Cetirizine HCl (ZYRTEC PO) Take 1 tablet by mouth daily as needed (allergies). Pt alternates with Allegra 180 mg prn    . Cholecalciferol (VITAMIN D3) 2000 UNITS capsule Take 1 capsule (2,000 Units total) by mouth daily.    . Coenzyme Q-10 100 MG capsule Take 300 mg by mouth daily.     . colchicine 0.6 MG tablet Take 0.6 mg by mouth daily as needed (2 tabs followed by 1 tab 2 hours later-repeat next day if pain persists.).     Marland Kitchen Cyanocobalamin (VITAMIN B 12 PO) Take by mouth.     . fexofenadine (ALLEGRA) 180 MG tablet Take 180 mg by mouth as needed for allergies or rhinitis. Alternates with Zyrtec    . FOLIC ACID PO Take 938 mg by mouth daily.    . furosemide (LASIX) 40 MG tablet Take 1 tablet (40 mg total) by mouth daily. 30 tablet 6  . ibuprofen (ADVIL,MOTRIN) 200 MG tablet Take 200 mg by mouth every 6 (six) hours as needed for pain.    .  Omega-3 Fatty Acids (FISH OIL) 600 MG CAPS Take by mouth.    . tamsulosin (FLOMAX) 0.4 MG CAPS capsule Take 0.4 mg by mouth daily.  11   No current facility-administered medications for this visit.     Past Medical History  Diagnosis Date  . Benign prostatic hypertrophy     hx of  . Other and unspecified hyperlipidemia   . Personal history of unspecified disorders of nervous system and sense organs   . Mitral valve disorders   . Unspecified sleep apnea   . Morbid obesity   . Personal history of urinary calculi   . Diverticulosis of colon (without mention of hemorrhage)   . Other thalassemia   . First degree atrioventricular block   . Gout   . Osteoarthritis   . Basal cell carcinoma   . Anemia   . GERD (gastroesophageal reflux disease)   . Peptic ulcer with hemorrhage     duodenum  . Blood transfusion without reported diagnosis 1956    gastrectomy  . Colon polyp 2006, 2010    TUBULAR ADENOMA    ROS:  All systems reviewed and negative except as noted in the HPI.   Past Surgical History  Procedure Laterality Date  . Tumor removed Left 2002    from finger- left index  . Moles removed  2008  . Cataract extraction, bilateral  [id number]    hx of  . Gastrectomy      hx of '56. Transfused 7 pints blood '55, 4 pints '56  . Colonoscopy  2015    multiple   . Esophagogastroduodenoscopy  2011    multiple     Family History  Problem Relation Age of Onset  . Coronary artery disease Father   . Heart attack Father   . Gout Father   . Cancer Daughter     skin  . Colon cancer Neg Hx      History   Social History  . Marital Status: Married    Spouse Name: Jacob [name]  . Number of Children: 8  . Years of Education: 18+   Occupational History  . executive     retired   Social History  Main Topics  . Smoking status: Former Smoker    Start date: 01/21/1970  . Smokeless tobacco: Never Used     Comment: quite in 1971  . Alcohol Use: No  . Drug Use: No  . Sexual Activity:    Partners: Female   Other Topics Concern  . Not on file   Social History Narrative   [employer] of [place]; [employer]. Married '59. Eight Daughters '60, '62, '66, '70, '73, '74, '78, '83; 19 grandchildren. Lives independently with wife. ACP- asked pt to think about these issues (DEc '12)           BP 128/58 mmHg  Pulse 49  Ht 5\' 7"  (1.702 m)  Wt 199 lb 12.8 oz (90.629 kg)  BMI 31.29 kg/m2  SpO2 99%  Physical Exam:  Well appearing 81 yo man, NAD HEENT: Unremarkable Neck: No JVD, no thyromegally Back: No CVA tenderness Lungs: Clear with no wheezes HEART: Regular rate rhythm, no murmurs, no rubs, no clicks Abd: soft, positive bowel sounds, no organomegally, no rebound, no guarding Ext: 2 plus pulses, no edema, no cyanosis, no clubbing Skin: No rashes no nodules Neuro: CN II through XII intact, motor grossly intact  EKG - nsr with complete heart block   Assess/Plan:            Complete heart block - Jacob Lance, MD at 08/07/2014 5:42 PM     Status: Written Related Problem: Complete heart block   Expand All Collapse All   The patient's last ECG demonstrated complete heart block and a narrow qrs. We discussed the implications of pacing. I have notified him that he could hold off on pacing but because he has symptomatic bradycardia at times, and I suspect return of conduction at other times, he would like to proceed with PPM insertion. The risks/benefits/goals/expectations of the procedure have been discussed with the patient and he wishes to proceed with PPM.             Essential hypertension - Jacob Lance, MD at 08/07/2014 5:43 PM     Status: Written Related Problem: Essential hypertension   Expand  All Collapse All   His blood pressure is well controlled today. He will continue his current meds.            PALPITATIONS - Jacob Lance, MD at 08/07/2014 5:44 PM     Status: Written Related Problem: PALPITATIONS   Expand All Collapse All   He is symptomatic and I suspect these symptoms are related to his intermittent AV conduction       Patient seen and examined. Agree with above. No  change since his prior clinic visit. Will proceed with PPM.  Jacob [name].D.

## 2014-09-05 NOTE — Discharge Summary (Signed)
ELECTROPHYSIOLOGY PROCEDURE DISCHARGE SUMMARY    Patient ID: Jacob [name],  MRN: [id number], DOB/AGE: Aug 16, 1933 81 y.o.  Admit date: 09/05/2014 Discharge date: 09/06/2014  Primary Care Physician: [name], MD Primary Cardiologist: Hochrein Electrophysiologist: Lovena Le  Primary Discharge Diagnosis:  Symptomatic complete heart block status post pacemaker implantation this admission  Secondary Discharge Diagnosis:  1.  BPH 2.  Hyperlipidemia 3.  Sleep apnea  Allergies  Allergen Reactions  . Prednisone     REACTION: GI bleed  . Sulfonamide Derivatives     REACTION: Rash     Procedures This Admission:  1.  Implantation of a MDT dual chamber PPM on 09/05/14 by Dr Lovena Le.  The patient received a MDT model number Advisa PPM with model number 5076 right atrial lead and 5076 right ventricular lead. There were no immediate post procedure complications. 2.  CXR on 09/06/14 demonstrated no pneumothorax status post device implantation.   Brief HPI: Jacob [name] is a 81 y.o. male was referred to electrophysiology in the outpatient setting for consideration of PPM implantation.  He has been found to have symptomatic complete heart block without reversible causes identified.  Risks, benefits, and alternatives to PPM implantation were reviewed with the patient who wished to proceed.   Hospital Course:  The patient was admitted and underwent implantation of a MDT dual chamber pacemaker with details as outlined above.  He  was monitored on telemetry overnight which demonstrated AV pacing.  Left chest was without hematoma or ecchymosis.  The device was interrogated and found to be functioning normally.  CXR was obtained and demonstrated no pneumothorax status post device implantation.  Wound care, arm mobility, and restrictions were reviewed with the patient.  The patient was examined and considered stable for discharge to home.    Physical Exam: Filed Vitals:   09/05/14 1530 09/05/14 1600 09/05/14 2100 09/06/14 0500  BP: 141/59 135/64 133/60 133/67  Pulse: 59 59 60 59  Temp:   98.7 F (37.1 C) 98.6 F (37 C)  TempSrc:   Oral Oral  Resp: 14 12 18 18   Weight:    199 lb 4.7 oz (90.4 kg)  SpO2: 100% 100% 99% 98%    GEN- The patient is well appearing, alert and oriented x 3 today.   HEENT: normocephalic, atraumatic; sclera clear, conjunctiva pink; hearing intact; oropharynx clear; neck supple, no JVP Lymph- no cervical lymphadenopathy Lungs- Clear to ausculation bilaterally, normal work of breathing.  No wheezes, rales, rhonchi Heart- Regular rate and rhythm (paced) GI- soft, non-tender, non-distended, bowel sounds present  Extremities- no clubbing, cyanosis, or edema; DP/PT/radial pulses 2+ bilaterally MS- no significant deformity or atrophy Skin- warm and dry, no rash or lesion, left chest without hematoma/ecchymosis Psych- euthymic mood, full affect Neuro- strength and sensation are intact   Labs:   Lab Results  Component Value Date   WBC 6.1 08/27/2014   HGB 11.3* 08/27/2014   HCT 36.5* 08/27/2014   MCV 58.9* 08/27/2014   PLT 215 08/27/2014   No results for input(s): NA, K, CL, CO2, BUN, CREATININE, CALCIUM, PROT, BILITOT, ALKPHOS, ALT, AST, GLUCOSE in the last 168 hours.  Invalid input(s): LABALBU  Discharge Medications:    Medication List    TAKE these medications        acetaminophen 500 MG tablet  Commonly known as:  TYLENOL  Take 500 mg by mouth every 6 (six) hours as needed (pain).     allopurinol 300 MG tablet  Commonly known as:  ZYLOPRIM  Take 1 tablet (300 mg total) by mouth daily.     aspirin 81 MG tablet  Take 81 mg by mouth daily.     b complex vitamins capsule  Take 1 capsule by mouth daily.     Coenzyme Q10 300 MG Caps  Take 300 mg by mouth daily.     colchicine 0.6 MG tablet  Take 0.6 mg by mouth daily as needed (gout).     fexofenadine 180 MG tablet  Commonly known as:  ALLEGRA  Take 180 mg  by mouth daily.     Fish Oil 600 MG Caps  Take 600 mg by mouth daily.     FOLIC ACID PO  Take 093 mg by mouth daily.     furosemide 40 MG tablet  Commonly known as:  LASIX  Take 1 tablet (40 mg total) by mouth daily.     ibuprofen 200 MG tablet  Commonly known as:  ADVIL,MOTRIN  Take 200 mg by mouth every 6 (six) hours as needed for pain.     tamsulosin 0.4 MG Caps capsule  Commonly known as:  FLOMAX  Take 0.4 mg by mouth daily.     VITAMIN B 12 PO  Take 1,000 mg by mouth daily.     Vitamin D3 2000 UNITS capsule  Take 1 capsule (2,000 Units total) by mouth daily.        Disposition:  Discharge Instructions    Diet - low sodium heart healthy    Complete by:  As directed      Increase activity slowly    Complete by:  As directed           Follow-up Information    Follow up with [facility] On 09/13/2014.   Why:  at 9:30AM for wound check   Contact information:   [address] [place] [address]       Follow up with [name], MD On 09/26/2014.   Specialty:  Cardiology   Why:  at 2:45PM   Contact information:   [address] [place] [address] [phone]       Follow up with Cristopher Peru, MD On 12/11/2014.   Specialty:  Cardiology   Why:  at 11:15AM   Contact information:   [address] [address] [phone]       Duration of Discharge Encounter: Greater than 30 minutes including physician time.  Signed, Chanetta Marshall, NP 09/06/2014 8:02 AM   EP Attending  Patient seen and examined. Agree with above. [place] for discharge.He has no escape this morning. Usual followup.   Mikle Bosworth.D.

## 2014-09-05 NOTE — Discharge Instructions (Signed)
° ° °  Supplemental Discharge Instructions for  Pacemaker/Defibrillator Patients  Activity No heavy lifting or vigorous activity with your left/right arm for 6 to 8 weeks.  Do not raise your left/right arm above your head for one week.  Gradually raise your affected arm as drawn below.           __       09/09/14                  09/10/14                      09/11/14              09/12/14  NO DRIVING for  1 week   ; you may begin driving on  4/32/76   .  WOUND CARE - Keep the wound area clean and dry.  Do not get this area wet for one week. No showers for one week; you may shower on   09/12/14  . - The tape/steri-strips on your wound will fall off; do not pull them off.  No bandage is needed on the site.  DO  NOT apply any creams, oils, or ointments to the wound area. - If you notice any drainage or discharge from the wound, any swelling or bruising at the site, or you develop a fever > 101? F after you are discharged home, call the office at once.  Special Instructions - You are still able to use cellular telephones; use the ear opposite the side where you have your pacemaker/defibrillator.  Avoid carrying your cellular phone near your device. - When traveling through airports, show security personnel your identification card to avoid being screened in the metal detectors.  Ask the security personnel to use the hand wand. - Avoid arc welding equipment, MRI testing (magnetic resonance imaging), TENS units (transcutaneous nerve stimulators).  Call the office for questions about other devices. - Avoid electrical appliances that are in poor condition or are not properly grounded. - Microwave ovens are safe to be near or to operate.

## 2014-09-06 ENCOUNTER — Ambulatory Visit (HOSPITAL_COMMUNITY): Payer: Medicare Other

## 2014-09-06 DIAGNOSIS — Z950 Presence of cardiac pacemaker: Secondary | ICD-10-CM | POA: Diagnosis not present

## 2014-09-06 DIAGNOSIS — N400 Enlarged prostate without lower urinary tract symptoms: Secondary | ICD-10-CM | POA: Diagnosis not present

## 2014-09-06 DIAGNOSIS — E785 Hyperlipidemia, unspecified: Secondary | ICD-10-CM | POA: Diagnosis not present

## 2014-09-06 DIAGNOSIS — R001 Bradycardia, unspecified: Secondary | ICD-10-CM | POA: Diagnosis not present

## 2014-09-06 DIAGNOSIS — I442 Atrioventricular block, complete: Secondary | ICD-10-CM | POA: Diagnosis not present

## 2014-09-06 MED FILL — Cefazolin Sodium for IV Soln 2 GM and Dextrose 3% (50 ML): INTRAVENOUS | Qty: 50 | Status: AC

## 2014-09-06 MED FILL — Gentamicin Sulfate Inj 40 MG/ML: INTRAMUSCULAR | Qty: 2 | Status: AC

## 2014-09-06 MED FILL — Sodium Chloride Irrigation Soln 0.9%: Qty: 500 | Status: AC

## 2014-09-06 MED FILL — Heparin Sodium (Porcine) 2 Unit/ML in Sodium Chloride 0.9%: INTRAMUSCULAR | Qty: 500 | Status: AC

## 2014-09-07 ENCOUNTER — Encounter: Payer: Self-pay | Admitting: Internal Medicine

## 2014-09-07 NOTE — Telephone Encounter (Signed)
Called patient regarding email. 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 Confirmed that his pacer site is not red, draining, he does not have fever or chills. Advised that if he experiences bleeding, drainage fever or chills to go to the ER over the weekend. Pt reports tenderness and mild swelling at the site- explained that this is normal. Confirmed wound check appt 09-13-14.

## 2014-09-09 ENCOUNTER — Encounter (HOSPITAL_BASED_OUTPATIENT_CLINIC_OR_DEPARTMENT_OTHER): Payer: Self-pay | Admitting: Emergency Medicine

## 2014-09-09 ENCOUNTER — Emergency Department (HOSPITAL_BASED_OUTPATIENT_CLINIC_OR_DEPARTMENT_OTHER)
Admission: EM | Admit: 2014-09-09 | Discharge: 2014-09-09 | Disposition: A | Discharge status: Home / Self Care | Payer: Medicare Other | Attending: Emergency Medicine | Admitting: Emergency Medicine

## 2014-09-09 DIAGNOSIS — N400 Enlarged prostate without lower urinary tract symptoms: Secondary | ICD-10-CM | POA: Insufficient documentation

## 2014-09-09 DIAGNOSIS — Z8546 Personal history of malignant neoplasm of prostate: Secondary | ICD-10-CM | POA: Diagnosis not present

## 2014-09-09 DIAGNOSIS — M1990 Unspecified osteoarthritis, unspecified site: Secondary | ICD-10-CM | POA: Diagnosis not present

## 2014-09-09 DIAGNOSIS — Z87891 Personal history of nicotine dependence: Secondary | ICD-10-CM | POA: Insufficient documentation

## 2014-09-09 DIAGNOSIS — Z79899 Other long term (current) drug therapy: Secondary | ICD-10-CM | POA: Insufficient documentation

## 2014-09-09 DIAGNOSIS — Z86010 Personal history of colonic polyps: Secondary | ICD-10-CM | POA: Insufficient documentation

## 2014-09-09 DIAGNOSIS — D649 Anemia, unspecified: Secondary | ICD-10-CM | POA: Insufficient documentation

## 2014-09-09 DIAGNOSIS — Z7982 Long term (current) use of aspirin: Secondary | ICD-10-CM | POA: Insufficient documentation

## 2014-09-09 DIAGNOSIS — M109 Gout, unspecified: Secondary | ICD-10-CM | POA: Insufficient documentation

## 2014-09-09 DIAGNOSIS — Z85828 Personal history of other malignant neoplasm of skin: Secondary | ICD-10-CM | POA: Insufficient documentation

## 2014-09-09 DIAGNOSIS — Z87442 Personal history of urinary calculi: Secondary | ICD-10-CM | POA: Diagnosis not present

## 2014-09-09 DIAGNOSIS — Z8701 Personal history of pneumonia (recurrent): Secondary | ICD-10-CM | POA: Diagnosis not present

## 2014-09-09 DIAGNOSIS — Z8719 Personal history of other diseases of the digestive system: Secondary | ICD-10-CM | POA: Diagnosis not present

## 2014-09-09 DIAGNOSIS — Z8669 Personal history of other diseases of the nervous system and sense organs: Secondary | ICD-10-CM | POA: Insufficient documentation

## 2014-09-09 DIAGNOSIS — Z950 Presence of cardiac pacemaker: Secondary | ICD-10-CM | POA: Insufficient documentation

## 2014-09-09 DIAGNOSIS — R21 Rash and other nonspecific skin eruption: Secondary | ICD-10-CM | POA: Diagnosis present

## 2014-09-09 NOTE — Discharge Instructions (Signed)
°  Use the medication as we discussed.  Apply antibiotic ointment every 4 hours alternating with hydrocortisone cream every 4 hours  Contact Dermatitis Contact dermatitis is a rash that happens when something touches the skin. You touched something that irritates your skin, or you have allergies to something you touched. HOME CARE   Avoid the thing that caused your rash.  Keep your rash away from hot water, soap, sunlight, chemicals, and other things that might bother it.  Do not scratch your rash.  You can take cool baths to help stop itching.  Only take medicine as told by your doctor.  Keep all doctor visits as told. GET HELP RIGHT AWAY IF:   Your rash is not better after 3 days.  Your rash gets worse.  Your rash is puffy (swollen), tender, red, sore, or warm.  You have problems with your medicine. MAKE SURE YOU:   Understand these instructions.  Will watch your condition.  Will get help right away if you are not doing well or get worse. Document Released: 12/28/2008 Document Revised: 05/25/2011 Document Reviewed: 08/05/2010 [facility] Patient Information 2015 [place], [place]. This information is not intended to replace advice given to you by your health care provider. Make sure you discuss any questions you have with your health care provider.

## 2014-09-09 NOTE — ED Notes (Signed)
Pt had pacemaker inserted on Thursday and chest shaved for procedure.  Friday morning, pt started to develop a rash across his left anterior chest.  Rash has worsened each day since.  Pt states rash is very itchy.  Topical antibiotic creams applied with no relief.

## 2014-09-10 NOTE — ED Provider Notes (Signed)
CSN: [id number]     Arrival date & time 09/09/14  1032 History   First MD Initiated Contact with Patient 09/09/14 1129     Chief Complaint  Patient presents with  . Rash      HPI Pt had pacemaker inserted on Thursday and chest shaved for procedure. Friday morning, pt started to develop a rash across his left anterior chest. Rash has worsened each day since. Pt states rash is very itchy. Topical antibiotic creams applied with no relief. Past Medical History  Diagnosis Date  . Benign prostatic hypertrophy     hx of  . Diverticulosis of colon (without mention of hemorrhage)   . Other thalassemia   . Gout   . Anemia   . GERD (gastroesophageal reflux disease)   . Colon polyp 2006, 2010    TUBULAR ADENOMA  . Presence of permanent cardiac pacemaker   . History of blood transfusion 12/1953; 03/1954    Transfused 7 pints blood '55, 4 pints '56  . MVP (mitral valve prolapse)   . First degree atrioventricular block   . Pneumonia "a number of times"  . OSA (obstructive sleep apnea)     "stopped wearing mask after I lost 100#"  . History of duodenal ulcer 1955    w/hemorrhage  . Osteoarthritis   . Arthritis     "middle finger both hands" (09/05/2014)  . Basal cell carcinoma   . Prostate cancer   . Kidney stones     "passed them"  . Morbid obesity     "I was 89; lost 100# 2014-2015"   Past Surgical History  Procedure Laterality Date  . Tumor removal Left 2002    from finger- left index  . Mole removal Left 2008    "forearm"  . Cataract extraction w/ intraocular lens  implant, bilateral  2002-2004  . Gastrectomy  03/1954    Transfused 7 pints blood '55, 4 pints '56  . Colonoscopy  2015    multiple   . Esophagogastroduodenoscopy  2011    multiple  . Ep implantable device N/A 09/05/2014    Procedure: Pacemaker Implant;  Surgeon: [name], MD;  Location: [place] [facility];  Service: Cardiovascular;  Laterality: N/A;  . Insert / replace / remove pacemaker  09/05/2014  .  Closed reduction ankle dislocation Right 1980's    "toe my achilles"   Family History  Problem Relation Age of Onset  . Coronary artery disease Father   . Heart attack Father   . Gout Father   . Cancer Daughter     skin  . Colon cancer Neg Hx    History  Substance Use Topics  . Smoking status: Former Smoker -- 3.00 packs/day for 20 years    Types: Cigarettes    Start date: 01/21/1970  . Smokeless tobacco: Never Used     Comment: quite smoking cigarettes in 1971  . Alcohol Use: Yes     Comment: "no alcohol since 1987"    Review of Systems  All other systems reviewed and are negative  Allergies  Prednisone and Sulfonamide derivatives  Home Medications   Prior to Admission medications   Medication Sig Start Date End Date Taking? Authorizing Provider  acetaminophen (TYLENOL) 500 MG tablet Take 500 mg by mouth every 6 (six) hours as needed (pain).     Historical Provider, MD  allopurinol (ZYLOPRIM) 300 MG tablet Take 1 tablet (300 mg total) by mouth daily. 05/04/13   Neena Rhymes, MD  aspirin  81 MG tablet Take 81 mg by mouth daily.      Historical Provider, MD  b complex vitamins capsule Take 1 capsule by mouth daily.    Historical Provider, MD  Cholecalciferol (VITAMIN D3) 2000 UNITS capsule Take 1 capsule (2,000 Units total) by mouth daily. 11/02/12   Neena Rhymes, MD  Coenzyme Q10 300 MG CAPS Take 300 mg by mouth daily.    Historical Provider, MD  colchicine 0.6 MG tablet Take 0.6 mg by mouth daily as needed (gout).  05/08/13   Neena Rhymes, MD  Cyanocobalamin (VITAMIN B 12 PO) Take 1,000 mg by mouth daily.     Historical Provider, MD  fexofenadine (ALLEGRA) 180 MG tablet Take 180 mg by mouth daily.     Historical Provider, MD  FOLIC ACID PO Take 938 mg by mouth daily.     Historical Provider, MD  furosemide (LASIX) 40 MG tablet Take 1 tablet (40 mg total) by mouth daily. 06/25/14   Pixie Casino, MD  ibuprofen (ADVIL,MOTRIN) 200 MG tablet Take 200 mg by mouth  every 6 (six) hours as needed for pain.    Historical Provider, MD  Omega-3 Fatty Acids (FISH OIL) 600 MG CAPS Take 600 mg by mouth daily.     Historical Provider, MD  tamsulosin (FLOMAX) 0.4 MG CAPS capsule Take 0.4 mg by mouth daily. 11/13/13   Historical Provider, MD   BP 132/62 mmHg  Pulse 82  Temp(Src) 98.2 F (36.8 C) (Oral)  Resp 18  Ht 5\' 7"  (1.702 m)  Wt 194 lb (87.998 kg)  BMI 30.38 kg/m2  SpO2 96% Physical Exam  Skin:       ED Course  Procedures (including critical care time) Labs Review   MDM   Final diagnoses:  Rash        Leonard Schwartz, MD 09/10/14 [phone]

## 2014-09-11 ENCOUNTER — Telehealth: Payer: Self-pay | Admitting: Internal Medicine

## 2014-09-11 ENCOUNTER — Telehealth: Payer: Self-pay | Admitting: Cardiology

## 2014-09-11 NOTE — Telephone Encounter (Signed)
Spoke with pt, he has had palpitations Friday/ Saturday and today. The episode today lasted about 20 min and the patient got concerned. He sent 2 transmissions during the episode and one after they were gone. The chest discomfort is located in the center to left chest. It is related to the palpitations and goes away when they resolve. He states it feels like his heart is out of synch with the pacemaker. Explained will forward to the device clinic.

## 2014-09-11 NOTE — Telephone Encounter (Signed)
Pt had a pacemaker implanted on 09-05-14.Friday felt like he had palpitations,that lasted a few. Saturday he had a discomfotr in his chest and it felt like a lump in his throat. This morning he had a discomfort in his chest ,which turned into pressure in his chest . This lasted for about 25 minutes,this morning he did 3 transmission 2 while this was happening. Please call to advise.

## 2014-09-11 NOTE — Telephone Encounter (Signed)
New message      Patient c/o Palpitations:  High priority if patient c/o lightheadedness and shortness of breath.  1. How long have you been having palpitations? 3 times since Friday  2. Are you currently experiencing lightheadedness and shortness of breath?No  3. Have you checked your BP and heart rate? (document readings) Heart Rate 60bmp;   4. Are you experiencing any other symptoms? Discomfort and pressure in chest  Please advise

## 2014-09-11 NOTE — Telephone Encounter (Signed)
Spoke w/ pt and informed him that I showed his remote transmissions (all 5) to device clinic nurse who stated that his device is functioning normally and he has had no episodes. Pt stated that he was not imagining it and that he did not completely understanding terminology and he would discuss this further at his appt on 6-30 w/ device tech.

## 2014-09-11 NOTE — Telephone Encounter (Signed)
Spoke with patient- discussed transmissions and symptoms- no episodes on any transmission. Clarified that we will troubleshoot device at the appt on the 30th. Patient appreciative.

## 2014-09-12 ENCOUNTER — Encounter: Payer: Self-pay | Admitting: Internal Medicine

## 2014-09-12 ENCOUNTER — Encounter: Payer: Self-pay | Admitting: Cardiology

## 2014-09-12 ENCOUNTER — Telehealth: Payer: Self-pay | Admitting: Internal Medicine

## 2014-09-12 NOTE — Telephone Encounter (Signed)
New message      Pt has had 4 "episodes" since procedure.  Pt heart rate went to 120 bpm while sitting yesterday   Patient c/o Palpitations:  High priority if patient c/o lightheadedness and shortness of breath.  1. How long have you been having palpitations? Lasted 2 to 2.5 minutes  2. Are you currently experiencing lightheadedness and shortness of breath?No  3. Have you checked your BP and heart rate? (document readings) No  4. Are you experiencing any other symptoms?No  Please advise

## 2014-09-12 NOTE — Telephone Encounter (Signed)
Will put him on Dr Tanna Furry schedule tomorrow and not device for wound check to discuss.  Lenna Sciara is calling patient to discuss

## 2014-09-13 ENCOUNTER — Ambulatory Visit: Payer: Medicare Other

## 2014-09-13 ENCOUNTER — Encounter: Payer: Self-pay | Admitting: Internal Medicine

## 2014-09-13 ENCOUNTER — Ambulatory Visit (INDEPENDENT_AMBULATORY_CARE_PROVIDER_SITE_OTHER): Payer: Medicare Other | Admitting: Internal Medicine

## 2014-09-13 VITALS — BP 115/50 | HR 70 | Ht 67.0 in | Wt 200.4 lb

## 2014-09-13 DIAGNOSIS — I480 Paroxysmal atrial fibrillation: Secondary | ICD-10-CM

## 2014-09-13 DIAGNOSIS — Z950 Presence of cardiac pacemaker: Secondary | ICD-10-CM

## 2014-09-13 DIAGNOSIS — I442 Atrioventricular block, complete: Secondary | ICD-10-CM | POA: Diagnosis not present

## 2014-09-13 DIAGNOSIS — I10 Essential (primary) hypertension: Secondary | ICD-10-CM | POA: Diagnosis not present

## 2014-09-13 DIAGNOSIS — I4891 Unspecified atrial fibrillation: Secondary | ICD-10-CM | POA: Insufficient documentation

## 2014-09-13 LAB — CUP PACEART INCLINIC DEVICE CHECK
Brady Statistic AP VP Percent: 62.68 %
Brady Statistic AS VP Percent: 34.3 %
Brady Statistic AS VS Percent: 3.01 %
Brady Statistic RV Percent Paced: 96.99 %
Date Time Interrogation Session: 20160630101403
Lead Channel Impedance Value: 285 Ohm
Lead Channel Pacing Threshold Amplitude: 0.5 V
Lead Channel Pacing Threshold Pulse Width: 0.4 ms
Lead Channel Setting Pacing Amplitude: 3.5 V
Lead Channel Setting Pacing Amplitude: 3.5 V
Lead Channel Setting Pacing Pulse Width: 0.4 ms
Lead Channel Setting Sensing Sensitivity: 4 mV
MDC IDC MSMT BATTERY VOLTAGE: 3.08 V
MDC IDC MSMT LEADCHNL RA IMPEDANCE VALUE: 437 Ohm
MDC IDC MSMT LEADCHNL RA SENSING INTR AMPL: 6.3 mV
MDC IDC MSMT LEADCHNL RV IMPEDANCE VALUE: 342 Ohm
MDC IDC MSMT LEADCHNL RV IMPEDANCE VALUE: 418 Ohm
MDC IDC MSMT LEADCHNL RV PACING THRESHOLD AMPLITUDE: 0.75 V
MDC IDC MSMT LEADCHNL RV PACING THRESHOLD PULSEWIDTH: 0.4 ms
MDC IDC MSMT LEADCHNL RV SENSING INTR AMPL: 15.9 mV
MDC IDC SET ZONE DETECTION INTERVAL: 400 ms
MDC IDC STAT BRADY AP VS PERCENT: 0.01 %
MDC IDC STAT BRADY RA PERCENT PACED: 62.69 %
Zone Setting Detection Interval: 400 ms

## 2014-09-13 MED ORDER — RIVAROXABAN 20 MG PO TABS: 20.0000 mg | ORAL_TABLET | Freq: Every day | ORAL | Status: DC

## 2014-09-13 NOTE — Patient Instructions (Addendum)
Medication Instructions:  Your physician has recommended you make the following change in your medication:  1) Start Xarelto 20 mg daily 2) Stop Aspirin   Labwork: None ordered  Testing/Procedures: None ordered  Follow-Up: Your physician recommends that you schedule a follow-up appointment as scheduled    Any Other Special Instructions Will Be Listed Below (If Applicable).

## 2014-09-13 NOTE — Assessment & Plan Note (Signed)
His medtronic DDD PM is working normally. Will recheck in several months. 

## 2014-09-13 NOTE — Assessment & Plan Note (Signed)
He has lost over 100 lbs. He is now at a reasonably good weight.

## 2014-09-13 NOTE — Assessment & Plan Note (Signed)
His blood pressure is well controlled. No change in meds.  

## 2014-09-13 NOTE — Assessment & Plan Note (Signed)
This is a new problem having never been diagnosed. He will be started on Xarelto and will followup in several months.

## 2014-09-13 NOTE — Progress Notes (Signed)
HPI Mr. Nees returns today for followup. He is a 81 yo man who underwent PPM insertion over a week ago and subsequently developed irritation of his skin. He was given an anti-biotic ointment that made thinks even worse. He has had an episode of palpitations and feeling strange that lasted several hours which coincides with atrial fib with an CVR. He has not had syncope. No sob. No fever or chills. He is here today to discuss additional treatment options.   Allergies  Allergen Reactions  . Prednisone     REACTION: GI bleed  . Sulfonamide Derivatives     REACTION: Rash     Current Outpatient Prescriptions  Medication Sig Dispense Refill  . acetaminophen (TYLENOL) 500 MG tablet Take 500 mg by mouth every 6 (six) hours as needed (pain).     Marland Kitchen allopurinol (ZYLOPRIM) 300 MG tablet Take 1 tablet (300 mg total) by mouth daily. 90 tablet 3  . b complex vitamins capsule Take 1 capsule by mouth daily.    . Cholecalciferol (VITAMIN D3) 2000 UNITS capsule Take 1 capsule (2,000 Units total) by mouth daily.    . Coenzyme Q10 300 MG CAPS Take 300 mg by mouth daily.    . colchicine 0.6 MG tablet Take 0.6 mg by mouth daily as needed (gout).     . Cyanocobalamin (VITAMIN B 12 PO) Take 1,000 mg by mouth daily.     . fexofenadine (ALLEGRA) 180 MG tablet Take 180 mg by mouth daily.     Marland Kitchen FOLIC ACID PO Take 967 mg by mouth daily.     . furosemide (LASIX) 40 MG tablet Take 1 tablet (40 mg total) by mouth daily. 30 tablet 6  . ibuprofen (ADVIL,MOTRIN) 200 MG tablet Take 200 mg by mouth every 6 (six) hours as needed for pain.    . Omega-3 Fatty Acids (FISH OIL) 600 MG CAPS Take 600 mg by mouth daily.     . tamsulosin (FLOMAX) 0.4 MG CAPS capsule Take 0.4 mg by mouth daily.  11  . rivaroxaban (XARELTO) 20 MG TABS tablet Take 1 tablet (20 mg total) by mouth daily with supper. 30 tablet 11   No current facility-administered medications for this visit.     Past Medical History  Diagnosis Date  .  Benign prostatic hypertrophy     hx of  . Diverticulosis of colon (without mention of hemorrhage)   . Other thalassemia   . Gout   . Anemia   . GERD (gastroesophageal reflux disease)   . Colon polyp 2006, 2010    TUBULAR ADENOMA  . Presence of permanent cardiac pacemaker   . History of blood transfusion 12/1953; 03/1954    Transfused 7 pints blood '55, 4 pints '56  . MVP (mitral valve prolapse)   . First degree atrioventricular block   . Pneumonia "a number of times"  . OSA (obstructive sleep apnea)     "stopped wearing mask after I lost 100#"  . History of duodenal ulcer 1955    w/hemorrhage  . Osteoarthritis   . Arthritis     "middle finger both hands" (09/05/2014)  . Basal cell carcinoma   . Prostate cancer   . Kidney stones     "passed them"  . Morbid obesity     "I was 89; lost 100# 2014-2015"    ROS:   All systems reviewed and negative except as noted in the HPI.   Past Surgical History  Procedure Laterality Date  .  Tumor removal Left 2002    from finger- left index  . Mole removal Left 2008    "forearm"  . Cataract extraction w/ intraocular lens  implant, bilateral  2002-2004  . Gastrectomy  03/1954    Transfused 7 pints blood '55, 4 pints '56  . Colonoscopy  2015    multiple   . Esophagogastroduodenoscopy  2011    multiple  . Ep implantable device N/A 09/05/2014    Procedure: Pacemaker Implant;  Surgeon: [name], MD;  Location: [facility];  Service: Cardiovascular;  Laterality: N/A;  . Insert / replace / remove pacemaker  09/05/2014  . Closed reduction ankle dislocation Right 1980's    "toe my achilles"     Family History  Problem Relation Age of Onset  . Coronary artery disease Father   . Heart attack Father   . Gout Father   . Cancer Daughter     skin  . Colon cancer Neg Hx      History   Social History  . Marital Status: Married    Spouse Name: ANn  . Number of Children: 8  . Years of Education: 18+   Occupational  History  . executive     retired   Social History Main Topics  . Smoking status: Former Smoker -- 3.00 packs/day for 20 years    Types: Cigarettes    Start date: 01/21/1970  . Smokeless tobacco: Never Used     Comment: quite smoking cigarettes in 1971  . Alcohol Use: Yes     Comment: "no alcohol since 1987"  . Drug Use: No  . Sexual Activity:    Partners: Female   Other Topics Concern  . Not on file   Social History Narrative   [employer]; [place]. Married '59. Eight Daughters '60, '62, '66, '70, '73, '74, '78, '83; 19 grandchildren. Lives independently with wife. ACP- asked pt to think about these issues (DEc '12)           BP 115/50 mmHg  Pulse 70  Ht 5\' 7"  (1.702 m)  Wt 200 lb 6.4 oz (90.901 kg)  BMI 31.38 kg/m2  SpO2 97%  Physical Exam:  Well appearing 81 yo man, NAD HEENT: Unremarkable Neck:  No JVD, no thyromegally Back:  No CVA tenderness Lungs:  Clear with no wheezes HEART:  Regular rate rhythm, no murmurs, no rubs, no clicks Abd:  soft, positive bowel sounds, no organomegally, no rebound, no guarding Ext:  2 plus pulses, no edema, no cyanosis, no clubbing Skin:  No rashes no nodules Neuro:  CN II through XII intact, motor grossly intact  DEVICE  Normal device function.  See PaceArt for details.   Assess/Plan:

## 2014-09-25 ENCOUNTER — Encounter: Payer: Self-pay | Admitting: Cardiology

## 2014-09-25 ENCOUNTER — Encounter: Payer: Self-pay | Admitting: Internal Medicine

## 2014-09-26 ENCOUNTER — Ambulatory Visit (INDEPENDENT_AMBULATORY_CARE_PROVIDER_SITE_OTHER): Payer: Medicare Other | Admitting: Cardiology

## 2014-09-26 ENCOUNTER — Encounter: Payer: Self-pay | Admitting: Cardiology

## 2014-09-26 VITALS — BP 130/72 | HR 85 | Ht 67.0 in | Wt 168.0 lb

## 2014-09-26 DIAGNOSIS — Z79899 Other long term (current) drug therapy: Secondary | ICD-10-CM

## 2014-09-26 MED ORDER — FUROSEMIDE 20 MG PO TABS: 20.0000 mg | ORAL_TABLET | Freq: Every day | ORAL | Status: DC

## 2014-09-26 NOTE — Progress Notes (Signed)
HPI The patient presents for followup of his slow heart rate.  He is status post pacemaker placement. More recently he saw Dr. Lovena Le and was noted to be in atrial fibrillation with rapid rate. He was started on anticoagulation.  He is not convinced that he feels this arrhythmia. He's not had any new complaints recently. He's had no recent palpitations, presyncope or syncope. He's had no chest pressure, neck or arm discomfort. He has had no weight gain or edema.  He is going to start exercising.   Allergies  Allergen Reactions  . Prednisone     REACTION: GI bleed  . Sulfonamide Derivatives     REACTION: Rash    Current Outpatient Prescriptions  Medication Sig Dispense Refill  . acetaminophen (TYLENOL) 500 MG tablet Take 500 mg by mouth every 6 (six) hours as needed (pain).     Marland Kitchen allopurinol (ZYLOPRIM) 300 MG tablet Take 1 tablet (300 mg total) by mouth daily. 90 tablet 3  . b complex vitamins capsule Take 1 capsule by mouth daily.    . Cholecalciferol (VITAMIN D3) 2000 UNITS capsule Take 1 capsule (2,000 Units total) by mouth daily.    . Coenzyme Q10 300 MG CAPS Take 300 mg by mouth daily.    . colchicine 0.6 MG tablet Take 0.6 mg by mouth daily as needed (gout).     . Cyanocobalamin (VITAMIN B 12 PO) Take 1,000 mg by mouth daily.     . fexofenadine (ALLEGRA) 180 MG tablet Take 180 mg by mouth daily.     Marland Kitchen FOLIC ACID PO Take 937 mg by mouth daily.     . furosemide (LASIX) 40 MG tablet Take 1 tablet (40 mg total) by mouth daily. 30 tablet 6  . ibuprofen (ADVIL,MOTRIN) 200 MG tablet Take 200 mg by mouth every 6 (six) hours as needed for pain.    . Omega-3 Fatty Acids (FISH OIL) 600 MG CAPS Take 600 mg by mouth daily.     . rivaroxaban (XARELTO) 20 MG TABS tablet Take 1 tablet (20 mg total) by mouth daily with supper. 30 tablet 11  . tamsulosin (FLOMAX) 0.4 MG CAPS capsule Take 0.4 mg by mouth daily.  11   No current facility-administered medications for this visit.    Past Medical  History  Diagnosis Date  . Benign prostatic hypertrophy     hx of  . Diverticulosis of colon (without mention of hemorrhage)   . Other thalassemia   . Gout   . Anemia   . GERD (gastroesophageal reflux disease)   . Colon polyp 2006, 2010    TUBULAR ADENOMA  . Presence of permanent cardiac pacemaker   . History of blood transfusion 12/1953; 03/1954    Transfused 7 pints blood '55, 4 pints '56  . MVP (mitral valve prolapse)   . First degree atrioventricular block   . Pneumonia "a number of times"  . OSA (obstructive sleep apnea)     "stopped wearing mask after I lost 100#"  . History of duodenal ulcer 1955    w/hemorrhage  . Osteoarthritis   . Arthritis     "middle finger both hands" (09/05/2014)  . Basal cell carcinoma   . Prostate cancer   . Kidney stones     "passed them"  . Morbid obesity     "I was 89; lost 100# 2014-2015"    Past Surgical History  Procedure Laterality Date  . Tumor removal Left 2002    from finger- left index  .  Mole removal Left 2008    "forearm"  . Cataract extraction w/ intraocular lens  implant, bilateral  2002-2004  . Gastrectomy  03/1954    Transfused 7 pints blood '55, 4 pints '56  . Colonoscopy  2015    multiple   . Esophagogastroduodenoscopy  2011    multiple  . Ep implantable device N/A 09/05/2014    Procedure: Pacemaker Implant;  Surgeon: [name], MD;  Location: [facility];  Service: Cardiovascular;  Laterality: N/A;  . Insert / replace / remove pacemaker  09/05/2014  . Closed reduction ankle dislocation Right 1980's    "toe my achilles"    ROS:  As stated in the HPI and negative for all other systems.  PHYSICAL EXAM BP 130/72 mmHg  Pulse 85  Ht 5\' 7"  (1.702 m)  Wt 168 lb (76.204 kg)  BMI 26.31 kg/m2 GENERAL:  Well appearing NECK:  No jugular venous distention, waveform within normal limits, carotid upstroke brisk and symmetric, no bruits, no thyromegaly LUNGS:  Clear to auscultation bilaterally CHEST:  Well  healed pacemaker pocket with some resolving erythema/rash below the incision HEART:  PMI not displaced or sustained,S1 and S2 within normal limits, no S3, no S4, no clicks, no rubs, no murmurs ABD:  Flat, positive bowel sounds normal in frequency in pitch, no bruits, no rebound, no guarding, no midline pulsatile mass, no hepatomegaly, no splenomegaly, obese EXT:  2 plus pulses throughout, no edema, no cyanosis no clubbing NEURO:  Nonfocal  EKG:  Sinus rhythm, rate 58, 2-1 heart block, poor anterior R wave progression, left axis deviation. 09/26/2014   ASSESSMENT AND PLAN  ATRIAL FIB - The patient is now noted to have this.   He's not feeling any paroxysms. He's tolerating the blood thinner. I will check a CBC in a couple of weeks. No change in therapy is indicated.   I reviewed with him his latest pacemaker interrogation.   [name] has a CHA2DS2 - VASc score of 2 with a risk of stroke of 2.2%.  BRADYCARDIA - He has not had any symptoms and his previous dizziness hopefully is resolved with the pacemaker.   EDEMA - This is resolved.  He will reduce his Lasix to 20 mg daily.   MR - This was mild to moderate in the past. No change in therapy is indicated.  ABNORMAL STRESS TEST - The patient had this before.  This was read as moderate risk with a small inferior defect. However, he did have a defect on a previous study in 2007. His EF was normal. We are continuing to manage this medically.

## 2014-09-26 NOTE — Patient Instructions (Addendum)
Your physician wants you to follow-up in: 6 Months. You will receive a reminder letter in the mail two months in advance. If you don't receive a letter, please call our office to schedule the follow-up appointment.  Your physician recommends that you return for lab work in: [place] has recommended you make the following change in your medication: Decrease Furosemide (Lasix) 20 mg daily

## 2014-09-27 ENCOUNTER — Encounter: Payer: Self-pay | Admitting: Cardiology

## 2014-09-27 ENCOUNTER — Encounter: Payer: Self-pay | Admitting: Internal Medicine

## 2014-10-05 ENCOUNTER — Encounter: Payer: Self-pay | Admitting: Internal Medicine

## 2014-10-05 NOTE — Telephone Encounter (Signed)
Informed patient that he didn't have any episodes recorded on Tuesday or Wednesday of this week. Patient voiced understanding. Patient requested that a copy of his transmission be mailed to his home.

## 2014-10-15 ENCOUNTER — Encounter: Payer: Self-pay | Admitting: Internal Medicine

## 2014-10-15 NOTE — Telephone Encounter (Signed)
Informed patient that he has been in [facility] since 10/05/14. Patient voiced understanding. Will defer further recommendations to Janan Halter, RN and GT.

## 2014-10-19 LAB — CBC
HCT: 30 % — ABNORMAL LOW (ref 39.0–52.0)
HEMOGLOBIN: 9.5 g/dL — AB (ref 13.0–17.0)
MCH: 18 pg — ABNORMAL LOW (ref 26.0–34.0)
MCHC: 31.7 g/dL (ref 30.0–36.0)
MCV: 56.8 fL — ABNORMAL LOW (ref 78.0–100.0)
PLATELETS: 271 10*3/uL (ref 150–400)
RBC: 5.28 MIL/uL (ref 4.22–5.81)
RDW: 18.4 % — AB (ref 11.5–15.5)
WBC: 6.4 10*3/uL (ref 4.0–10.5)

## 2014-10-22 ENCOUNTER — Encounter: Payer: Self-pay | Admitting: Internal Medicine

## 2014-10-22 ENCOUNTER — Encounter: Payer: Self-pay | Admitting: Cardiology

## 2014-10-24 ENCOUNTER — Telehealth: Payer: Self-pay

## 2014-10-24 NOTE — Telephone Encounter (Signed)
Pt called. He stated he had left message on MyChart as well. His latest CBC by Dr Percival Spanish, his Hgb is 9.5. Dr Percival Spanish put him on xarelto for pacemaker/a-fib. The pt feels the xarelto may be nocking down his Hgb. He has an appt with Dr Percival Spanish tomorrow and wishes Dr Julien Nordmann and Dr Percival Spanish to confer.

## 2014-10-24 NOTE — Telephone Encounter (Signed)
Dr. Percival Spanish can call me if he has any questions.

## 2014-10-25 ENCOUNTER — Ambulatory Visit (INDEPENDENT_AMBULATORY_CARE_PROVIDER_SITE_OTHER): Payer: Medicare Other | Admitting: Cardiology

## 2014-10-25 ENCOUNTER — Encounter: Payer: Self-pay | Admitting: Cardiology

## 2014-10-25 VITALS — BP 136/62 | HR 81 | Ht 67.0 in | Wt 200.0 lb

## 2014-10-25 DIAGNOSIS — I480 Paroxysmal atrial fibrillation: Secondary | ICD-10-CM

## 2014-10-25 DIAGNOSIS — I440 Atrioventricular block, first degree: Secondary | ICD-10-CM | POA: Diagnosis not present

## 2014-10-25 DIAGNOSIS — I10 Essential (primary) hypertension: Secondary | ICD-10-CM | POA: Diagnosis not present

## 2014-10-25 NOTE — Telephone Encounter (Addendum)
S/w pt about Dr Julien Nordmann reply. Pt feels that if Dr Percival Spanish has not called Dr Julien Nordmann as of yet he probably won't. Pt has a list of questions for Dr Percival Spanish. I encouraged pt to see Dr Percival Spanish today and if he feels he needs to have an appt with Dr Julien Nordmann to give [place] a call back. Pt is unhappy that he feels so listless and worn out with the lower Hgb, and hct of 30. Pt feels he needs to switch to Integra rather than xarelto. Told him to discuss this with Dr Percival Spanish, as he is the prescribing physician.

## 2014-10-25 NOTE — Patient Instructions (Signed)
Your physician recommends that you schedule a follow-up appointment in: [place] physician recommends that you return for lab work CBC

## 2014-10-25 NOTE — Progress Notes (Signed)
HPI The patient presents for followup of his slow heart rate.  He is status post pacemaker placement. He was also in atrial fibrillation more recently. I actually followed an email chain between the patient and our device clinic. He has been noted to be in more persistent atrial fibrillation since 7/22.  He said he actually thought likely went into fibrillation when he went out and mowed the lawn 1 day. He's had follow-up blood work on his anticoagulation which was started following the diagnosis of atrial fibrillation. His hemoglobin is drifting down from his baseline anemia. A few days ago was 9.5. He's not had any active bleeding other than some nosebleeds which she's had in the past but seemed to be slightly more problematic. He's not noticing any dark black stools or red blood in his bowel movement. He's not really noticing any palpitations, presyncope or syncope. He has shortness of breath, PND or orthopnea. He's had no weight gain or edema.  Allergies  Allergen Reactions  . Prednisone     REACTION: GI bleed  . Sulfonamide Derivatives     REACTION: Rash    Current Outpatient Prescriptions  Medication Sig Dispense Refill  . acetaminophen (TYLENOL) 500 MG tablet Take 500 mg by mouth every 6 (six) hours as needed (pain).     Marland Kitchen allopurinol (ZYLOPRIM) 300 MG tablet Take 1 tablet (300 mg total) by mouth daily. 90 tablet 3  . b complex vitamins capsule Take 1 capsule by mouth daily.    . Cholecalciferol (VITAMIN D3) 2000 UNITS capsule Take 1 capsule (2,000 Units total) by mouth daily.    . Coenzyme Q10 300 MG CAPS Take 300 mg by mouth daily.    . colchicine 0.6 MG tablet Take 0.6 mg by mouth daily as needed (gout).     . Cyanocobalamin (VITAMIN B 12 PO) Take 1,000 mg by mouth daily.     . fexofenadine (ALLEGRA) 180 MG tablet Take 180 mg by mouth daily.     Marland Kitchen FOLIC ACID PO Take 101 mg by mouth daily.     . furosemide (LASIX) 20 MG tablet Take 1 tablet (20 mg total) by mouth daily. 30 tablet 6   . ibuprofen (ADVIL,MOTRIN) 200 MG tablet Take 200 mg by mouth every 6 (six) hours as needed for pain.    . Omega-3 Fatty Acids (FISH OIL) 600 MG CAPS Take 600 mg by mouth daily.     . rivaroxaban (XARELTO) 20 MG TABS tablet Take 1 tablet (20 mg total) by mouth daily with supper. 30 tablet 11  . tamsulosin (FLOMAX) 0.4 MG CAPS capsule Take 0.4 mg by mouth daily.  11   No current facility-administered medications for this visit.    Past Medical History  Diagnosis Date  . Benign prostatic hypertrophy     hx of  . Diverticulosis of colon (without mention of hemorrhage)   . Other thalassemia   . Gout   . Anemia   . GERD (gastroesophageal reflux disease)   . Colon polyp 2006, 2010    TUBULAR ADENOMA  . Presence of permanent cardiac pacemaker   . History of blood transfusion 12/1953; 03/1954    Transfused 7 pints blood '55, 4 pints '56  . MVP (mitral valve prolapse)   . First degree atrioventricular block   . Pneumonia "a number of times"  . OSA (obstructive sleep apnea)     "stopped wearing mask after I lost 100#"  . History of duodenal ulcer 1955    w/hemorrhage  .  Osteoarthritis   . Arthritis     "middle finger both hands" (09/05/2014)  . Basal cell carcinoma   . Prostate cancer   . Kidney stones     "passed them"  . Morbid obesity     "I was 89; lost 100# 2014-2015"    Past Surgical History  Procedure Laterality Date  . Tumor removal Left 2002    from finger- left index  . Mole removal Left 2008    "forearm"  . Cataract extraction w/ intraocular lens  implant, bilateral  2002-2004  . Gastrectomy  03/1954    Transfused 7 pints blood '55, 4 pints '56  . Colonoscopy  2015    multiple   . Esophagogastroduodenoscopy  2011    multiple  . Ep implantable device N/A 09/05/2014    Procedure: Pacemaker Implant;  Surgeon: [name], MD;  Location: [facility];  Service: Cardiovascular;  Laterality: N/A;  . Insert / replace / remove pacemaker  09/05/2014  . Closed  reduction ankle dislocation Right 1980's    "toe my achilles"    ROS:  As stated in the HPI and negative for all other systems.  PHYSICAL EXAM BP 136/62 mmHg  Pulse 81  Ht 5\' 7"  (1.702 m)  Wt 200 lb (90.719 kg)  BMI 31.32 kg/m2 GENERAL:  Well appearing NECK:  No jugular venous distention, waveform within normal limits, carotid upstroke brisk and symmetric, no bruits, no thyromegaly LUNGS:  Clear to auscultation bilaterally CHEST:  Well healed pacemaker pocket with some resolving erythema/rash below the incision HEART:  PMI not displaced or sustained,S1 and S2 within normal limits, no S3, no S4, no clicks, no rubs, no murmurs ABD:  Flat, positive bowel sounds normal in frequency in pitch, no bruits, no rebound, no guarding, no midline pulsatile mass, no hepatomegaly, no splenomegaly, obese EXT:  2 plus pulses throughout, no edema, no cyanosis no clubbing NEURO:  Nonfocal  EKG:  Atrial flutter, ventricular pacing rate 68. . 10/25/2014   ASSESSMENT AND PLAN  ATRIAL FIB - The patient is now noted to have this atrial flutter. He is persistently now in fibrillation or flutter. We did try to overdrive pace him today but this did not work..   He's not feeling any paroxysms.   [name] has a CHA2DS2 - VASc score of 2 with a risk of stroke of 2.2%.  I will check another CBC to make sure his hemoglobin is stable. If it's going down I most likely will hold the Xarelto while he has an anemia workup. He reports previously having a colonoscopy that was unremarkable. He says he has some stool guaiacs were negative recently 2. He does have known thalassemia and chronic anemia.  BRADYCARDIA - This is not treated post pacemaker  EDEMA - This is resolved.  MR - This was mild to moderate in the past. No change in therapy is indicated.

## 2014-10-26 ENCOUNTER — Telehealth: Payer: Self-pay | Admitting: *Deleted

## 2014-10-26 DIAGNOSIS — D649 Anemia, unspecified: Secondary | ICD-10-CM

## 2014-10-26 LAB — CBC
HCT: 29.4 % — ABNORMAL LOW (ref 39.0–52.0)
Hemoglobin: 9.4 g/dL — ABNORMAL LOW (ref 13.0–17.0)
MCH: 18.1 pg — ABNORMAL LOW (ref 26.0–34.0)
MCHC: 32 g/dL (ref 30.0–36.0)
MCV: 56.6 fL — ABNORMAL LOW (ref 78.0–100.0)
Platelets: 282 10*3/uL (ref 150–400)
RBC: 5.19 MIL/uL (ref 4.22–5.81)
RDW: 18.2 % — AB (ref 11.5–15.5)
WBC: 6 10*3/uL (ref 4.0–10.5)

## 2014-10-26 NOTE — Telephone Encounter (Signed)
-----   Message from [name], MD sent at 10/26/2014  1:01 PM EDT ----- Hbg is stable.  Continue blood thinner.  Check Hbg in two weeks.  Check stool guaiac.  Follow with hematology.

## 2014-10-26 NOTE — Telephone Encounter (Signed)
Patient and his wife aware of lab orders. Order for stool guaiac and repeat CBC placed.  Call placed to Dr Arvilla Market office for appt.

## 2014-10-27 IMAGING — CT CT ABD-PELV W/O CM
2 of 4 series · 17 of 46 positions shown, 19 images · non-contrast
Comparison: 08/20/2008

CLINICAL DATA: Rule out kidney stone

CT ABDOMEN AND PELVIS WITHOUT CONTRAST
TECHNIQUE: Multidetector CT imaging of the abdomen and pelvis was
performed following the standard protocol without intravenous
contrast.

[Series 2: ap stone study · axial · 0.90mm/px · z∈[-609,-174]mm · 14 of 95 slices shown, 16 images]
[im 4/95  soft-tissue]
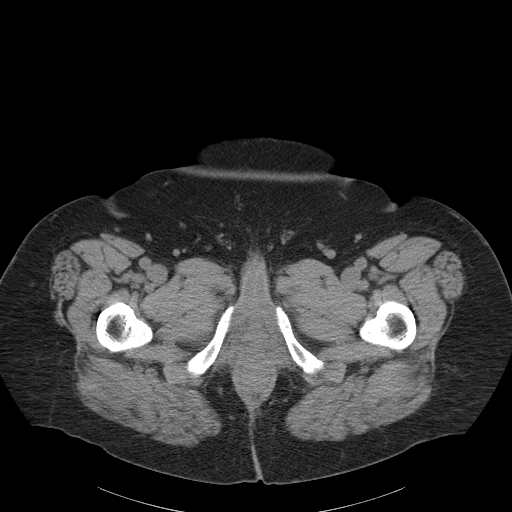
[im 4/95  bone]
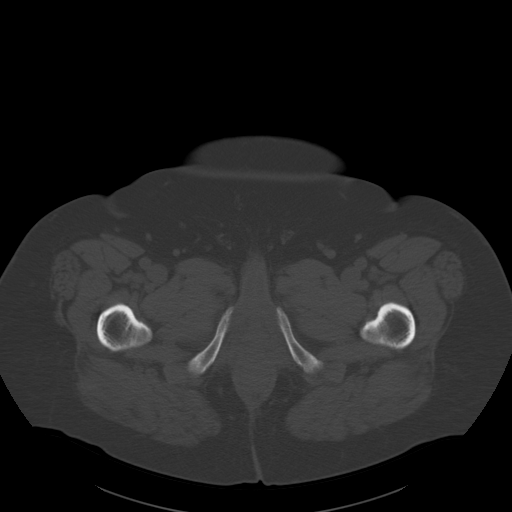
[im 11/95  soft-tissue]
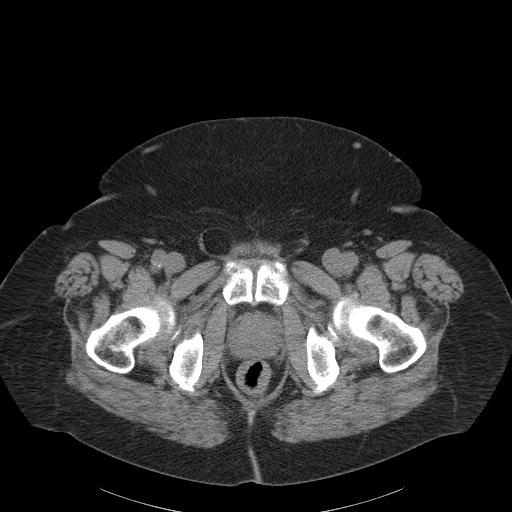
[im 19/95  soft-tissue]
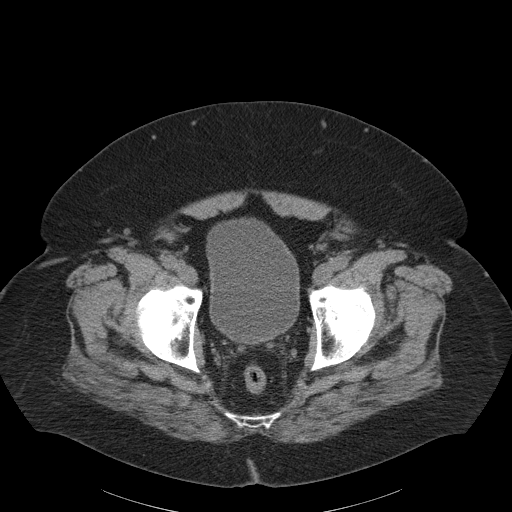
[im 26/95  soft-tissue]
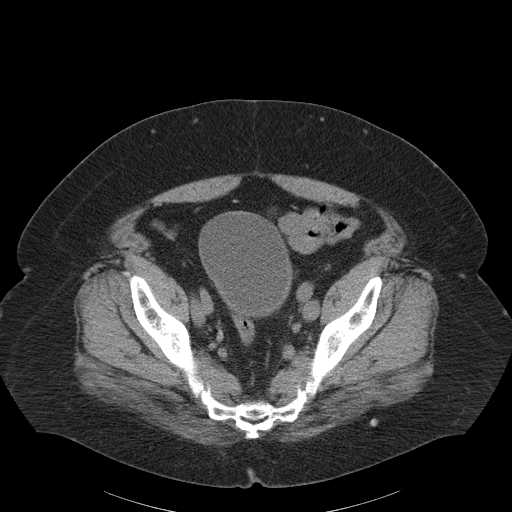
[im 33/95  soft-tissue]
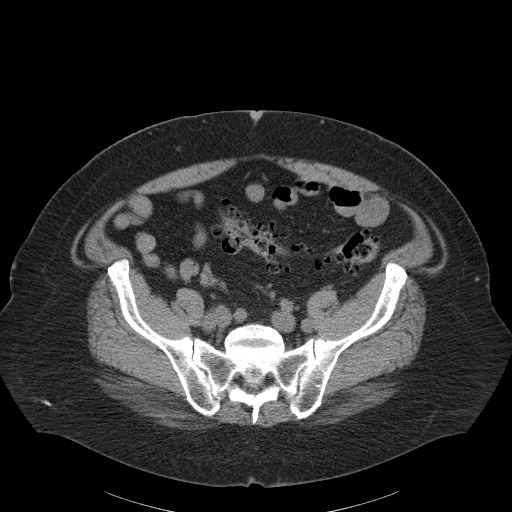
[im 37/95  soft-tissue]
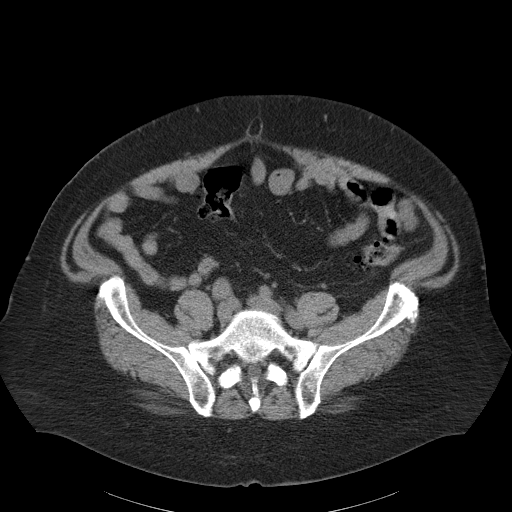
[im 44/95  soft-tissue]
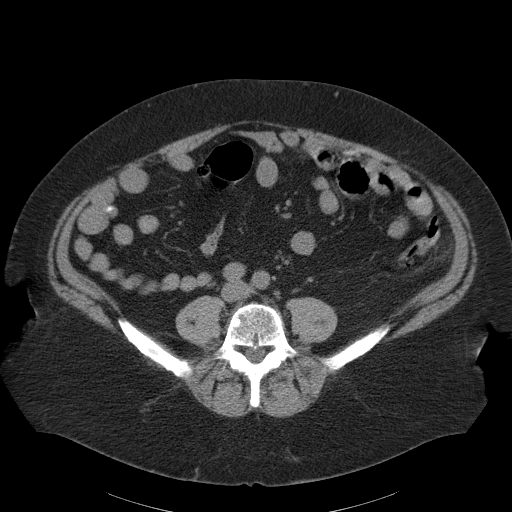
[im 51/95  soft-tissue]
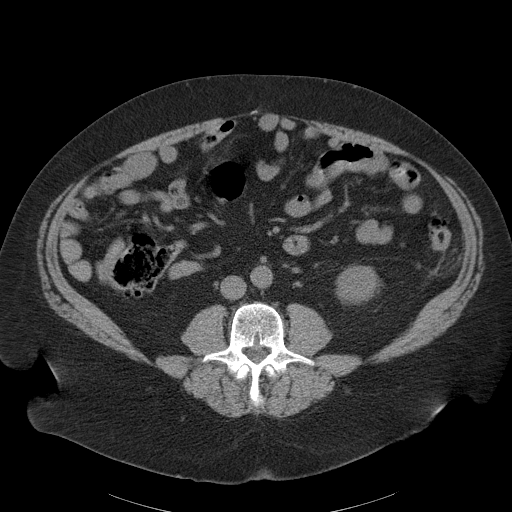
[im 58/95  soft-tissue]
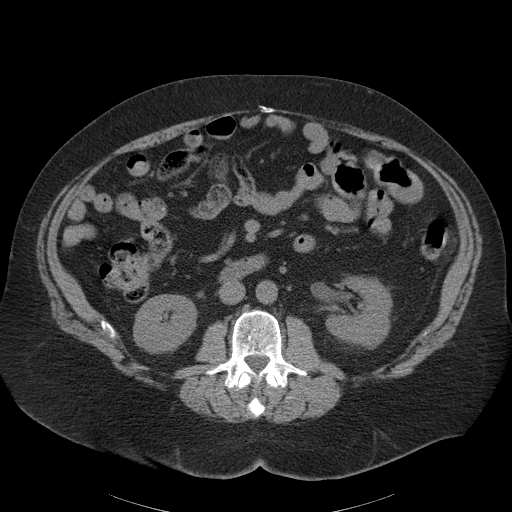
[im 58/95  bone]
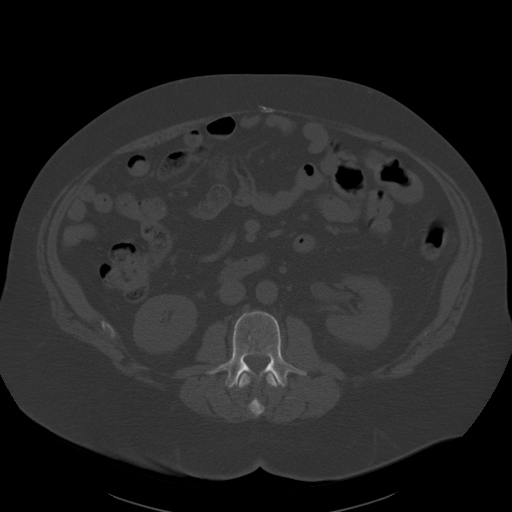
[im 62/95  soft-tissue]
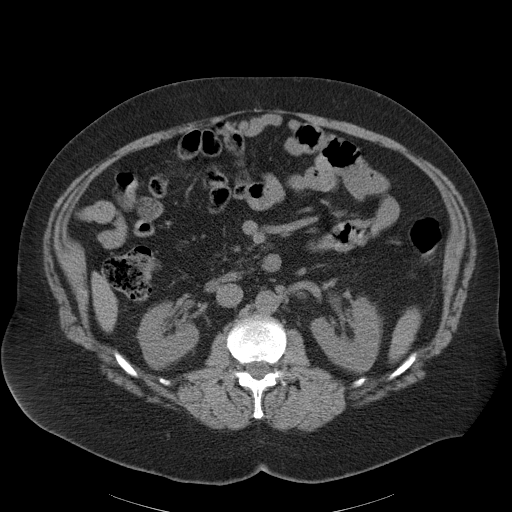
[im 69/95  soft-tissue]
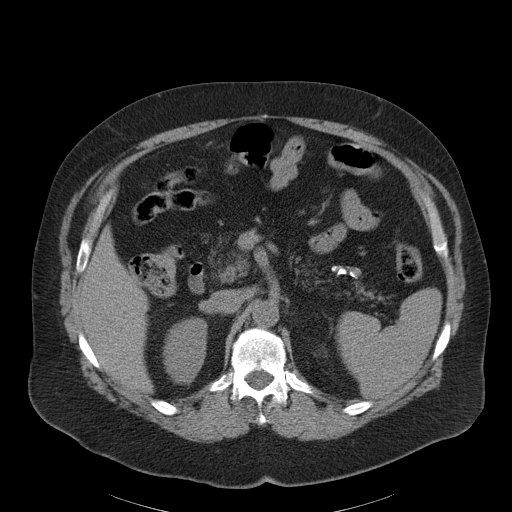
[im 76/95  soft-tissue]
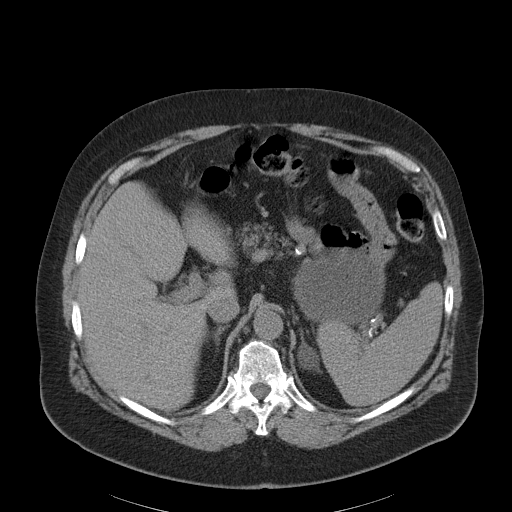
[im 84/95  soft-tissue]
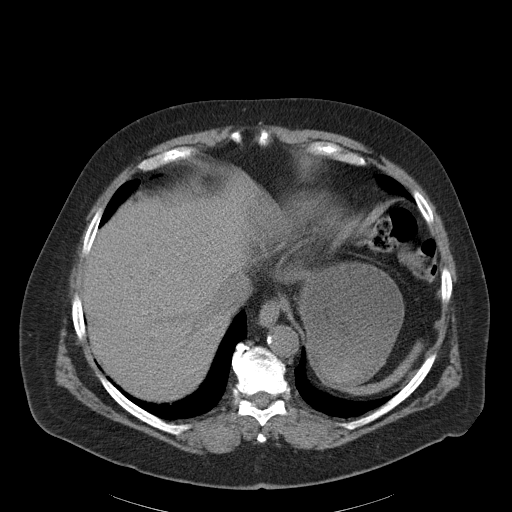
[im 91/95  soft-tissue]
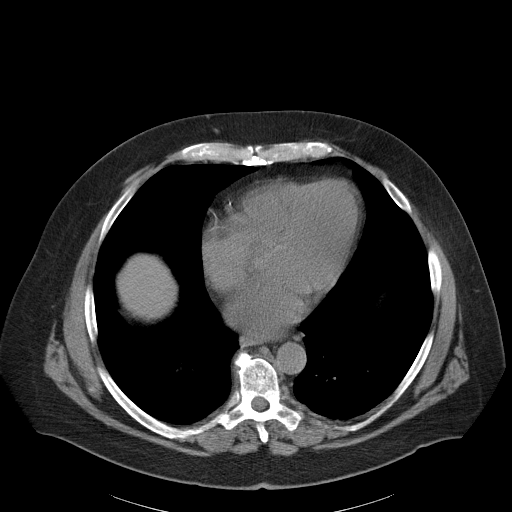

[Series 602: cor · coronal · 0.96mm/px · 3 of 126 slices shown]
[im 42/126  soft-tissue]
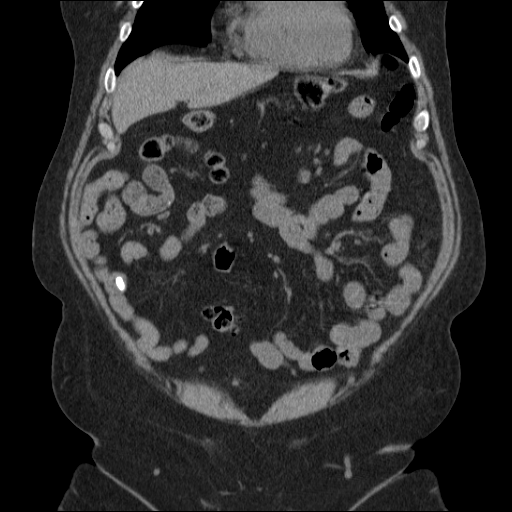
[im 56/126  soft-tissue]
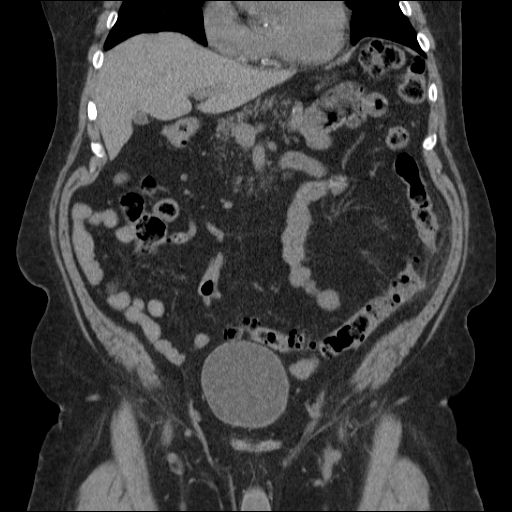
[im 70/126  soft-tissue]
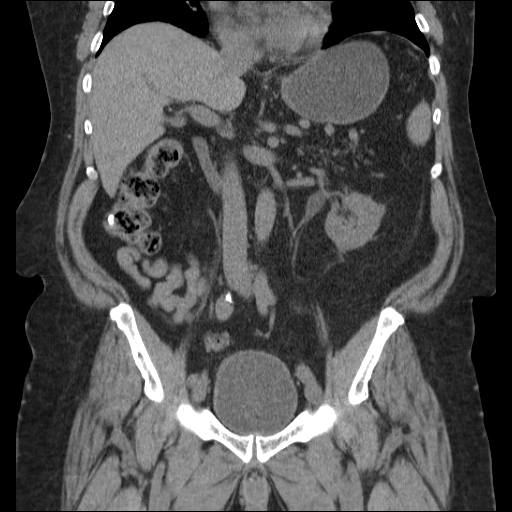

[17 of 46 positions shown; findings below may reference images not displayed]

FINDINGS: The lung bases are clear.  No pericardial or pleural effusion.

The gallbladder is normal.  Normal appearance of the pancreas.  The
spleen appears unremarkable.

The right adrenal gland is normal.  Nodule in the left adrenal
gland likely represents a benign adenoma.  This measures 2.1 cm,
image 19 and is unchanged from previous exam.

The right kidney appears normal.  No right-sided nephrolithiasis or
hydronephrosis. No left-sided nephrolithiasis or hydronephrosis.
The urinary bladder appears normal.  The prostate gland and seminal
vesicles appear normal.

Normal appearance of the stomach.  The small bowel loops are within
normal limits.  The appendix is visualized and appears normal.
There are focal inflammatory changes adjacent to the descending
colon.  There is a fat density structure which measures 2.6 x
cm, image 46, arising from the lateral margin of the colon with
surrounding increased attenuation.  This is favored to represent
acute epiploic appendagitis.  Multiple sigmoid diverticula
identified without acute inflammation.  No perforation or abscess
formation.  No free fluid or abnormal fluid collections noted.

Review of the visualized osseous structures is significant for mild
degenerative disc disease.
IMPRESSION: 1.  Examination is positive for focal inflammation along the
lateral side of the descending colon.  Findings are favored to
represent acute epiploic appendagitis. The differential for this
entity is acute diverticulitis.
2.  No perforation or abscess formation
3.  Stable left adrenal nodule.  Likely benign adenoma.

## 2014-10-31 ENCOUNTER — Telehealth: Payer: Self-pay | Admitting: Medical Oncology

## 2014-10-31 ENCOUNTER — Encounter: Payer: Self-pay | Admitting: Cardiology

## 2014-10-31 ENCOUNTER — Telehealth: Payer: Self-pay | Admitting: Cardiology

## 2014-10-31 ENCOUNTER — Other Ambulatory Visit: Payer: Self-pay | Admitting: Medical Oncology

## 2014-10-31 DIAGNOSIS — D568 Other thalassemias: Secondary | ICD-10-CM

## 2014-10-31 LAB — FECAL OCCULT BLOOD, IMMUNOCHEMICAL: Fecal Occult Blood: NEGATIVE

## 2014-10-31 NOTE — Telephone Encounter (Signed)
Spoke to [place] at Dr. Worthy Flank office - she states she will f/u w/ patient to get him back in for services.

## 2014-10-31 NOTE — Telephone Encounter (Signed)
Called patient, he still has not heard from Dr. Worthy Flank office.  I called back at Dr. Worthy Flank office to speak to nurse - got VM - message left for them to call.

## 2014-10-31 NOTE — Progress Notes (Signed)
ONC tx request sent 

## 2014-10-31 NOTE — Telephone Encounter (Signed)
Pt called in stating that he spoke with Curt Bears on 8/12 about getting an appt with Dr. Mckinley Jewel. Is someone from his office suppose to call him or is that something Curt Bears is specifically taking care of? Please f/u with the pt  Thanks

## 2014-10-31 NOTE — Telephone Encounter (Signed)
Requested appt and labs for 2 weeks-sent to scheduler

## 2014-11-05 ENCOUNTER — Encounter: Payer: Self-pay | Admitting: Cardiology

## 2014-11-05 ENCOUNTER — Encounter: Payer: Self-pay | Admitting: Internal Medicine

## 2014-11-05 ENCOUNTER — Telehealth: Payer: Self-pay | Admitting: *Deleted

## 2014-11-05 NOTE — Telephone Encounter (Signed)
VERIFIED WITH DEBORAH THAT PT.'S LAB WORK ON 11/13/14 WOULD INCLUDE A CBC.

## 2014-11-07 ENCOUNTER — Other Ambulatory Visit: Payer: Self-pay | Admitting: *Deleted

## 2014-11-07 NOTE — Addendum Note (Signed)
Addended by: Zebedee Iba on: 11/07/2014 07:15 AM   Modules accepted: Orders

## 2014-11-12 NOTE — Addendum Note (Signed)
Addended by: Zebedee Iba on: 11/12/2014 02:16 PM   Modules accepted: Orders

## 2014-11-13 ENCOUNTER — Other Ambulatory Visit (HOSPITAL_BASED_OUTPATIENT_CLINIC_OR_DEPARTMENT_OTHER): Payer: Medicare Other

## 2014-11-13 DIAGNOSIS — D509 Iron deficiency anemia, unspecified: Secondary | ICD-10-CM | POA: Diagnosis not present

## 2014-11-13 DIAGNOSIS — D568 Other thalassemias: Secondary | ICD-10-CM

## 2014-11-13 DIAGNOSIS — D561 Beta thalassemia: Secondary | ICD-10-CM

## 2014-11-13 LAB — CBC WITH DIFFERENTIAL/PLATELET
BASO%: 0.2 % (ref 0.0–2.0)
Basophils Absolute: 0 10*3/uL (ref 0.0–0.1)
EOS%: 2.7 % (ref 0.0–7.0)
Eosinophils Absolute: 0.1 10*3/uL (ref 0.0–0.5)
HCT: 30.1 % — ABNORMAL LOW (ref 38.4–49.9)
HEMOGLOBIN: 9.4 g/dL — AB (ref 13.0–17.1)
LYMPH#: 1.5 10*3/uL (ref 0.9–3.3)
LYMPH%: 29.3 % (ref 14.0–49.0)
MCH: 17.8 pg — ABNORMAL LOW (ref 27.2–33.4)
MCHC: 31.2 g/dL — ABNORMAL LOW (ref 32.0–36.0)
MCV: 57 fL — ABNORMAL LOW (ref 79.3–98.0)
MONO#: 0.5 10*3/uL (ref 0.1–0.9)
MONO%: 9.8 % (ref 0.0–14.0)
NEUT#: 3 10*3/uL (ref 1.5–6.5)
NEUT%: 58 % (ref 39.0–75.0)
NRBC: 0 % (ref 0–0)
Platelets: 213 10*3/uL (ref 140–400)
RBC: 5.28 10*6/uL (ref 4.20–5.82)
RDW: 17.9 % — AB (ref 11.0–14.6)
WBC: 5.2 10*3/uL (ref 4.0–10.3)

## 2014-11-13 LAB — IRON AND TIBC CHCC
%SAT: 6 % — AB (ref 20–55)
IRON: 23 ug/dL — AB (ref 42–163)
TIBC: 401 ug/dL (ref 202–409)
UIBC: 378 ug/dL — AB (ref 117–376)

## 2014-11-13 LAB — FERRITIN CHCC: Ferritin: 124 ng/ml (ref 22–316)

## 2014-11-14 NOTE — Addendum Note (Signed)
Addended by: Diana Eves on: 11/14/2014 05:21 PM   Modules accepted: Orders

## 2014-11-15 ENCOUNTER — Ambulatory Visit (HOSPITAL_BASED_OUTPATIENT_CLINIC_OR_DEPARTMENT_OTHER): Payer: Medicare Other | Admitting: Internal Medicine

## 2014-11-15 ENCOUNTER — Telehealth: Payer: Self-pay | Admitting: Internal Medicine

## 2014-11-15 ENCOUNTER — Encounter: Payer: Self-pay | Admitting: Internal Medicine

## 2014-11-15 VITALS — BP 141/75 | HR 81 | Temp 98.4°F | Resp 17 | Ht 67.0 in | Wt 195.3 lb

## 2014-11-15 DIAGNOSIS — D509 Iron deficiency anemia, unspecified: Secondary | ICD-10-CM | POA: Diagnosis not present

## 2014-11-15 DIAGNOSIS — D563 Thalassemia minor: Secondary | ICD-10-CM

## 2014-11-15 DIAGNOSIS — D568 Other thalassemias: Secondary | ICD-10-CM

## 2014-11-15 MED ORDER — INTEGRA PLUS PO CAPS: 1.0000 | ORAL_CAPSULE | Freq: Every morning | ORAL | Status: DC

## 2014-11-15 NOTE — Telephone Encounter (Signed)
Gave and printed appt sched and avs for pt for NOV and DEC °

## 2014-11-15 NOTE — Progress Notes (Signed)
[facility] Telephone:([phone]   [phone]  OFFICE PROGRESS NOTE  [name], MD [place] [address]  DIAGNOSIS: History of beta thalassemia minor and iron deficiency anemia  PRIOR THERAPY:  Integra plus 1 capsule by mouth daily.  CURRENT THERAPY: None.  INTERVAL HISTORY: Jacob [name] 81 y.o. male returns to the clinic today for followup visit. The patient is feeling the same as the last time he was seen here with no fatigue or weakness. He has not taken the Integra plus for several months now. He denied having any significant fever or chills, no nausea or vomiting. He has no chest pain, shortness of breath, cough or hemoptysis. He has no significant weight loss or night sweats. He had repeat CBC, iron study and ferritin performed recently and he is here for evaluation and discussion of his lab results.  MEDICAL HISTORY: Past Medical History  Diagnosis Date  . Benign prostatic hypertrophy     hx of  . Diverticulosis of colon (without mention of hemorrhage)   . Other thalassemia   . Gout   . Anemia   . GERD (gastroesophageal reflux disease)   . Colon polyp 2006, 2010    TUBULAR ADENOMA  . Presence of permanent cardiac pacemaker   . History of blood transfusion 12/1953; 03/1954    Transfused 7 pints blood '55, 4 pints '56  . MVP (mitral valve prolapse)   . First degree atrioventricular block   . Pneumonia "a number of times"  . OSA (obstructive sleep apnea)     "stopped wearing mask after I lost 100#"  . History of duodenal ulcer 1955    w/hemorrhage  . Osteoarthritis   . Arthritis     "middle finger both hands" (09/05/2014)  . Basal cell carcinoma   . Prostate cancer   . Kidney stones     "passed them"  . Morbid obesity     "I was 89; lost 100# 2014-2015"    ALLERGIES:  is allergic to prednisone and sulfonamide derivatives.  MEDICATIONS:  Current Outpatient Prescriptions  Medication Sig Dispense  Refill  . acetaminophen (TYLENOL) 500 MG tablet Take 500 mg by mouth every 6 (six) hours as needed (pain).     Marland Kitchen allopurinol (ZYLOPRIM) 300 MG tablet Take 1 tablet (300 mg total) by mouth daily. 90 tablet 3  . b complex vitamins capsule Take 1 capsule by mouth daily.    . Cholecalciferol (VITAMIN D3) 2000 UNITS capsule Take 1 capsule (2,000 Units total) by mouth daily.    . Coenzyme Q10 300 MG CAPS Take 300 mg by mouth daily.    . colchicine 0.6 MG tablet Take 0.6 mg by mouth daily as needed (gout).     . Cyanocobalamin (VITAMIN B 12 PO) Take 1,000 mg by mouth daily.     . fexofenadine (ALLEGRA) 180 MG tablet Take 180 mg by mouth daily.     Marland Kitchen FOLIC ACID PO Take 858 mg by mouth daily.     . furosemide (LASIX) 20 MG tablet Take 1 tablet (20 mg total) by mouth daily. 30 tablet 6  . ibuprofen (ADVIL,MOTRIN) 200 MG tablet Take 200 mg by mouth every 6 (six) hours as needed for pain.    . Omega-3 Fatty Acids (FISH OIL) 600 MG CAPS Take 600 mg by mouth daily.     . rivaroxaban (XARELTO) 20 MG TABS tablet Take 1 tablet (20 mg total) by mouth daily with supper. [place]  tablet 11  . tamsulosin (FLOMAX) 0.4 MG CAPS capsule Take 0.4 mg by mouth daily.  11   No current facility-administered medications for this visit.    SURGICAL HISTORY:  Past Surgical History  Procedure Laterality Date  . Tumor removal Left 2002    from finger- left index  . Mole removal Left 2008    "forearm"  . Cataract extraction w/ intraocular lens  implant, bilateral  2002-2004  . Gastrectomy  03/1954    Transfused 7 pints blood '55, 4 pints '56  . Colonoscopy  2015    multiple   . Esophagogastroduodenoscopy  2011    multiple  . Ep implantable device N/A 09/05/2014    Procedure: Pacemaker Implant;  Surgeon: [name], MD;  Location: [facility];  Service: Cardiovascular;  Laterality: N/A;  . Insert / replace / remove pacemaker  09/05/2014  . Closed reduction ankle dislocation Right 1980's    "toe my achilles"     REVIEW OF SYSTEMS:  A comprehensive review of systems was negative.   PHYSICAL EXAMINATION: General appearance: alert, cooperative and no distress Head: Normocephalic, without obvious abnormality, atraumatic Neck: no adenopathy, no JVD, supple, symmetrical, trachea midline and thyroid not enlarged, symmetric, no tenderness/mass/nodules Lymph nodes: Cervical, supraclavicular, and axillary nodes normal. Resp: clear to auscultation bilaterally Back: symmetric, no curvature. ROM normal. No CVA tenderness. Cardio: regular rate and rhythm, S1, S2 normal, no murmur, click, rub or gallop GI: soft, non-tender; bowel sounds normal; no masses,  no organomegaly Extremities: extremities normal, atraumatic, no cyanosis or edema  ECOG PERFORMANCE STATUS: 0 - Asymptomatic  Blood pressure 141/75, pulse 81, temperature 98.4 F (36.9 C), temperature source Oral, resp. rate 17, height 5\' 7"  (1.702 m), weight 195 lb 4.8 oz (88.587 kg), SpO2 97 %.  LABORATORY DATA: Lab Results  Component Value Date   WBC 5.2 11/13/2014   HGB 9.4* 11/13/2014   HCT 30.1* 11/13/2014   MCV 57.0* 11/13/2014   PLT 213 11/13/2014      Chemistry      Component Value Date/Time   NA 138 08/27/2014 0916   NA 141 12/12/2012 1522   K 4.6 08/27/2014 0916   K 4.2 12/12/2012 1522   CL 103 08/27/2014 0916   CO2 27 08/27/2014 0916   CO2 24 12/12/2012 1522   BUN 32* 08/27/2014 0916   BUN 22.4 12/12/2012 1522   CREATININE 0.96 08/27/2014 0916   CREATININE 0.90 05/25/2014 1030   CREATININE 1.1 12/12/2012 1522      Component Value Date/Time   CALCIUM 9.6 08/27/2014 0916   CALCIUM 9.7 12/12/2012 1522   ALKPHOS 70 05/03/2013 1300   ALKPHOS 86 12/12/2012 1522   AST 26 05/03/2013 1300   AST 21 12/12/2012 1522   ALT 20 05/03/2013 1300   ALT 19 12/12/2012 1522   BILITOT 1.9* 05/03/2013 1300   BILITOT 0.96 12/12/2012 1522     Other lab results: Ferritin 124, serum iron 23, total iron binding capacity 401 and iron  saturation 6%.  RADIOGRAPHIC STUDIES: No results found.  ASSESSMENT AND PLAN: This is a very pleasant 81 years old white male with beta thalassemia minor as well as iron deficiency anemia. He has mild anemia consistent with his history of thalassemia minor. His hemoglobin, hematocrit as well as iron study showed clear evidence for iron deficiency anemia. I strongly recommended for the patient to resume his treatment with Integra plus 1 capsule by mouth daily. I also discussed with him proceeding with intravenous Feraheme  infusion but the patient would like to proceed with the oral iron tablets for now. I would see him back for followup visit in 3 months with repeat CBC, iron study and ferritin. He was advised to call immediately if he has any concerning symptoms in the interval. The patient voices understanding of current disease status and treatment options and is in agreement with the current care plan.  All questions were answered. The patient knows to call the clinic with any problems, questions or concerns. We can certainly see the patient much sooner if necessary.  Disclaimer: This note was dictated with voice recognition software. Similar sounding words can inadvertently be transcribed and may not be corrected upon review.

## 2014-11-26 ENCOUNTER — Encounter: Payer: Self-pay | Admitting: Internal Medicine

## 2014-11-27 ENCOUNTER — Other Ambulatory Visit: Payer: Self-pay | Admitting: Medical Oncology

## 2014-11-27 DIAGNOSIS — D568 Other thalassemias: Secondary | ICD-10-CM

## 2014-11-27 MED ORDER — INTEGRA PLUS PO CAPS: 1.0000 | ORAL_CAPSULE | Freq: Every morning | ORAL | Status: DC

## 2014-11-27 NOTE — Progress Notes (Signed)
I called in [place] plus  rx to local pharmacy. Pt cannot tolerate folivane. Pt notified.

## 2014-11-27 NOTE — Telephone Encounter (Signed)
Triage voicemail from patient in reference to the "message sent on MyChart in refence to script given he has stopped taking."  Call forwarded to collaborative nurse.

## 2014-11-30 ENCOUNTER — Telehealth: Payer: Self-pay | Admitting: *Deleted

## 2014-11-30 NOTE — Telephone Encounter (Signed)
No note

## 2014-12-04 ENCOUNTER — Encounter: Payer: Self-pay | Admitting: Internal Medicine

## 2014-12-05 ENCOUNTER — Encounter: Payer: Self-pay | Admitting: Medical Oncology

## 2014-12-05 ENCOUNTER — Encounter: Payer: Self-pay | Admitting: Cardiology

## 2014-12-06 ENCOUNTER — Encounter: Payer: Self-pay | Admitting: Cardiology

## 2014-12-06 ENCOUNTER — Ambulatory Visit (INDEPENDENT_AMBULATORY_CARE_PROVIDER_SITE_OTHER): Payer: Medicare Other | Admitting: Cardiology

## 2014-12-06 VITALS — BP 124/68 | HR 60 | Ht 67.0 in | Wt 197.2 lb

## 2014-12-06 DIAGNOSIS — I10 Essential (primary) hypertension: Secondary | ICD-10-CM

## 2014-12-06 DIAGNOSIS — I442 Atrioventricular block, complete: Secondary | ICD-10-CM

## 2014-12-06 DIAGNOSIS — I480 Paroxysmal atrial fibrillation: Secondary | ICD-10-CM

## 2014-12-06 NOTE — Patient Instructions (Signed)
Your physician wants you to follow-up in: 6 Months. You will receive a reminder letter in the mail two months in advance. If you don't receive a letter, please call our office to schedule the follow-up appointment.  Your physician recommends that you return for lab work in: 2 Weeks CBC

## 2014-12-06 NOTE — Progress Notes (Signed)
HPI The patient presents for followup of his slow heart rate, fib/flutter.  He is status post pacemaker placement. He was also in atrial fibrillation more recently had at the last appointment flutter which we could not overdrive pace. He tolerates anticoagulation bruits had some nosebleeds. He has a chronic anemia that is stable. He was stool guaiac negative recently. His hemoglobins have been lower but stable.  He is going to se ENT. He's not having any active GI bleeding. He denies any chest pressure, neck or arm discomfort. He does have some mild dyspnea with exertion. His biggest issue has been fatigue and decreased exercise tolerance. He's also has easy bruising.   Allergies  Allergen Reactions  . Prednisone     REACTION: GI bleed  . Sulfonamide Derivatives     REACTION: Rash    Current Outpatient Prescriptions  Medication Sig Dispense Refill  . acetaminophen (TYLENOL) 500 MG tablet Take 500 mg by mouth every 6 (six) hours as needed (pain).     Marland Kitchen allopurinol (ZYLOPRIM) 300 MG tablet Take 1 tablet (300 mg total) by mouth daily. 90 tablet 3  . b complex vitamins capsule Take 1 capsule by mouth daily.    . Cholecalciferol (VITAMIN D3) 2000 UNITS capsule Take 1 capsule (2,000 Units total) by mouth daily.    . Coenzyme Q10 300 MG CAPS Take 300 mg by mouth daily.    . colchicine 0.6 MG tablet Take 0.6 mg by mouth daily as needed (gout).     . Cyanocobalamin (VITAMIN B 12 PO) Take 1,000 mg by mouth daily.     Marland Kitchen FeFum-FePoly-FA-B Cmp-C-Biot (INTEGRA PLUS) CAPS Take 1 capsule by mouth every morning. 30 capsule 3  . fexofenadine (ALLEGRA) 180 MG tablet Take 180 mg by mouth daily.     . furosemide (LASIX) 20 MG tablet Take 1 tablet (20 mg total) by mouth daily. 30 tablet 6  . ibuprofen (ADVIL,MOTRIN) 200 MG tablet Take 200 mg by mouth every 6 (six) hours as needed for pain.    . Omega-3 Fatty Acids (FISH OIL) 600 MG CAPS Take 600 mg by mouth daily.     . rivaroxaban (XARELTO) 20 MG TABS tablet  Take 1 tablet (20 mg total) by mouth daily with supper. 30 tablet 11  . tamsulosin (FLOMAX) 0.4 MG CAPS capsule Take 0.4 mg by mouth daily.  11   No current facility-administered medications for this visit.    Past Medical History  Diagnosis Date  . Benign prostatic hypertrophy     hx of  . Diverticulosis of colon (without mention of hemorrhage)   . Other thalassemia   . Gout   . Anemia   . GERD (gastroesophageal reflux disease)   . Colon polyp 2006, 2010    TUBULAR ADENOMA  . Presence of permanent cardiac pacemaker   . History of blood transfusion 12/1953; 03/1954    Transfused 7 pints blood '55, 4 pints '56  . MVP (mitral valve prolapse)   . First degree atrioventricular block   . Pneumonia "a number of times"  . OSA (obstructive sleep apnea)     "stopped wearing mask after I lost 100#"  . History of duodenal ulcer 1955    w/hemorrhage  . Osteoarthritis   . Arthritis     "middle finger both hands" (09/05/2014)  . Basal cell carcinoma   . Prostate cancer   . Kidney stones     "passed them"  . Morbid obesity     "I was 294;  lost 100# 2014-2015"    Past Surgical History  Procedure Laterality Date  . Tumor removal Left 2002    from finger- left index  . Mole removal Left 2008    "forearm"  . Cataract extraction w/ intraocular lens  implant, bilateral  2002-2004  . Gastrectomy  03/1954    Transfused 7 pints blood '55, 4 pints '56  . Colonoscopy  2015    multiple   . Esophagogastroduodenoscopy  2011    multiple  . Ep implantable device N/A 09/05/2014    Procedure: Pacemaker Implant;  Surgeon: [name], MD;  Location: [facility];  Service: Cardiovascular;  Laterality: N/A;  . Insert / replace / remove pacemaker  09/05/2014  . Closed reduction ankle dislocation Right 1980's    "toe my achilles"    ROS:  As stated in the HPI and negative for all other systems.  PHYSICAL EXAM BP 124/68 mmHg  Pulse 60  Ht 5\' 7"  (1.702 m)  Wt 197 lb 3.2 oz (89.449 kg)   BMI 30.88 kg/m2 GENERAL:  Well appearing NECK:  No jugular venous distention, waveform within normal limits, carotid upstroke brisk and symmetric, no bruits, no thyromegaly LUNGS:  Clear to auscultation bilaterally CHEST:  Well healed pacemaker pocket.   HEART:  PMI not displaced or sustained,S1 and S2 within normal limits, no S3, no S4, no clicks, no rubs, no murmurs ABD:  Flat, positive bowel sounds normal in frequency in pitch, no bruits, no rebound, no guarding, no midline pulsatile mass, no hepatomegaly, no splenomegaly, obese EXT:  2 plus pulses throughout, trace ankle edema, no cyanosis no clubbing NEURO:  Nonfocal   ASSESSMENT AND PLAN  ATRIAL FIB - The patient is now noted to have this atrial flutter and fib.   Mr. Christia Coaxum Lauf has a CHA2DS2 - VASc score of 2 with a risk of stroke of 2.2%.  In 2 weeks I will check another CBC. He does not have an absolute contraindication to anticoagulation. I don't know if he is persistently in fibrillation or flutter but he will get his device interrogated soon and he will see Dr. Lovena Le. At this point I don't see an advantage to try to maintain sinus rhythm. Rather continue with anticoagulation.   BRADYCARDIA - This is now treated post pacemaker  EDEMA - This is mild. No change in therapy is indicated.  MR - This was mild to moderate in the past. No change in therapy is indicated.  I will follow-up echoes in the future.

## 2014-12-11 ENCOUNTER — Encounter: Payer: Self-pay | Admitting: Internal Medicine

## 2014-12-11 ENCOUNTER — Ambulatory Visit (INDEPENDENT_AMBULATORY_CARE_PROVIDER_SITE_OTHER): Payer: Medicare Other | Admitting: Internal Medicine

## 2014-12-11 VITALS — BP 122/70 | HR 72 | Ht 67.0 in | Wt 200.6 lb

## 2014-12-11 DIAGNOSIS — I10 Essential (primary) hypertension: Secondary | ICD-10-CM

## 2014-12-11 DIAGNOSIS — I480 Paroxysmal atrial fibrillation: Secondary | ICD-10-CM | POA: Diagnosis not present

## 2014-12-11 DIAGNOSIS — I442 Atrioventricular block, complete: Secondary | ICD-10-CM

## 2014-12-11 DIAGNOSIS — Z950 Presence of cardiac pacemaker: Secondary | ICD-10-CM

## 2014-12-11 LAB — CUP PACEART INCLINIC DEVICE CHECK
Battery Remaining Longevity: 104 mo
Battery Voltage: 3.02 V
Brady Statistic AP VP Percent: 47.01 %
Brady Statistic RA Percent Paced: 50.07 %
Brady Statistic RV Percent Paced: 77.93 %
Lead Channel Impedance Value: 380 Ohm
Lead Channel Impedance Value: 399 Ohm
Lead Channel Impedance Value: 494 Ohm
Lead Channel Pacing Threshold Amplitude: 0.5 V
Lead Channel Pacing Threshold Amplitude: 0.625 V
Lead Channel Pacing Threshold Pulse Width: 0.4 ms
Lead Channel Sensing Intrinsic Amplitude: 4.375 mV
Lead Channel Setting Pacing Amplitude: 2 V
Lead Channel Setting Pacing Pulse Width: 0.4 ms
Lead Channel Setting Sensing Sensitivity: 4 mV
MDC IDC MSMT LEADCHNL RA IMPEDANCE VALUE: 266 Ohm
MDC IDC MSMT LEADCHNL RA SENSING INTR AMPL: 4.875 mV
MDC IDC MSMT LEADCHNL RV PACING THRESHOLD PULSEWIDTH: 0.4 ms
MDC IDC MSMT LEADCHNL RV SENSING INTR AMPL: 15.125 mV
MDC IDC MSMT LEADCHNL RV SENSING INTR AMPL: 17.125 mV
MDC IDC SESS DTM: 20160927152205
MDC IDC SET LEADCHNL RV PACING AMPLITUDE: 2.5 V
MDC IDC SET ZONE DETECTION INTERVAL: 400 ms
MDC IDC STAT BRADY AP VS PERCENT: 3.06 %
MDC IDC STAT BRADY AS VP PERCENT: 30.91 %
MDC IDC STAT BRADY AS VS PERCENT: 19.02 %
Zone Setting Detection Interval: 400 ms

## 2014-12-11 NOTE — Progress Notes (Signed)
HPI Mr. Downard returns today for followup. He is a 81 yo man who underwent PPM insertion 3 months ago and subsequently developed irritation of his skin. He was given an anti-biotic ointment that made thinks even worse. He has had an episode of palpitations and feeling strange that lasted several hours which coincides with atrial fib with an CVR. He has not had syncope. No sob. No fever or chills. He has restarted exercising but he has not gotten back to his baseline level of activity.  Allergies  Allergen Reactions  . Prednisone     REACTION: GI bleed  . Sulfonamide Derivatives     REACTION: Rash     Current Outpatient Prescriptions  Medication Sig Dispense Refill  . acetaminophen (TYLENOL) 500 MG tablet Take 500 mg by mouth every 6 (six) hours as needed (pain).     Marland Kitchen allopurinol (ZYLOPRIM) 300 MG tablet Take 1 tablet (300 mg total) by mouth daily. 90 tablet 3  . Ascorbic Acid (VITAMIN C PO) Take 1 capsule by mouth daily as needed (supplement).    Marland Kitchen b complex vitamins capsule Take 1 capsule by mouth daily.    . Cholecalciferol (VITAMIN D3) 2000 UNITS capsule Take 1 capsule (2,000 Units total) by mouth daily.    . Coenzyme Q10 300 MG CAPS Take 300 mg by mouth daily.    . colchicine 0.6 MG tablet Take 0.6 mg by mouth daily as needed (gout).     . Cyanocobalamin (VITAMIN B 12 PO) Take 1,000 mg by mouth daily.     Marland Kitchen FeFum-FePoly-FA-B Cmp-C-Biot (INTEGRA PLUS) CAPS Take 1 capsule by mouth every morning. 30 capsule 3  . fexofenadine (ALLEGRA) 180 MG tablet Take 180 mg by mouth daily.     . furosemide (LASIX) 20 MG tablet Take 1 tablet (20 mg total) by mouth daily. 30 tablet 6  . ibuprofen (ADVIL,MOTRIN) 200 MG tablet Take 200 mg by mouth every 6 (six) hours as needed for pain.    . Omega-3 Fatty Acids (FISH OIL) 600 MG CAPS Take 600 mg by mouth daily.     . rivaroxaban (XARELTO) 20 MG TABS tablet Take 1 tablet (20 mg total) by mouth daily with supper. 30 tablet 11  . tamsulosin  (FLOMAX) 0.4 MG CAPS capsule Take 0.4 mg by mouth daily.  11   No current facility-administered medications for this visit.     Past Medical History  Diagnosis Date  . Benign prostatic hypertrophy     hx of  . Diverticulosis of colon (without mention of hemorrhage)   . Other thalassemia   . Gout   . Anemia   . GERD (gastroesophageal reflux disease)   . Colon polyp 2006, 2010    TUBULAR ADENOMA  . Presence of permanent cardiac pacemaker   . History of blood transfusion 12/1953; 03/1954    Transfused 7 pints blood '55, 4 pints '56  . MVP (mitral valve prolapse)   . First degree atrioventricular block   . Pneumonia "a number of times"  . OSA (obstructive sleep apnea)     "stopped wearing mask after I lost 100#"  . History of duodenal ulcer 1955    w/hemorrhage  . Osteoarthritis   . Arthritis     "middle finger both hands" (09/05/2014)  . Basal cell carcinoma   . Prostate cancer   . Kidney stones     "passed them"  . Morbid obesity     "I was 294; lost 100# 2014-2015"  ROS:   All systems reviewed and negative except as noted in the HPI.   Past Surgical History  Procedure Laterality Date  . Tumor removal Left 2002    from finger- left index  . Mole removal Left 2008    "forearm"  . Cataract extraction w/ intraocular lens  implant, bilateral  2002-2004  . Gastrectomy  03/1954    Transfused 7 pints blood '55, 4 pints '56  . Colonoscopy  2015    multiple   . Esophagogastroduodenoscopy  2011    multiple  . Ep implantable device N/A 09/05/2014    Procedure: Pacemaker Implant;  Surgeon: [name], MD;  Location: [facility];  Service: Cardiovascular;  Laterality: N/A;  . Insert / replace / remove pacemaker  09/05/2014  . Closed reduction ankle dislocation Right 1980's    "toe my achilles"     Family History  Problem Relation Age of Onset  . Coronary artery disease Father   . Heart attack Father   . Gout Father   . Cancer Daughter     skin  . Colon  cancer Neg Hx      Social History   Social History  . Marital Status: Married    Spouse Name: ANn  . Number of Children: 8  . Years of Education: 18+   Occupational History  . executive     retired   Social History Main Topics  . Smoking status: Former Smoker -- 3.00 packs/day for 20 years    Types: Cigarettes    Start date: 01/21/1970  . Smokeless tobacco: Never Used     Comment: quite smoking cigarettes in 1971  . Alcohol Use: Yes     Comment: "no alcohol since 1987"  . Drug Use: No  . Sexual Activity:    Partners: Female   Other Topics Concern  . Not on file   Social History Narrative   [employer] [place]; [place]. Married '59. Eight Daughters '60, '62, '66, '70, '73, '74, '78, '83; 19 grandchildren. Lives independently with wife. ACP- asked pt to think about these issues (DEc '12)           BP 122/70 mmHg  Pulse 72  Ht 5\' 7"  (1.702 m)  Wt 200 lb 9.6 oz (90.992 kg)  BMI 31.41 kg/m2  Physical Exam:  Well appearing 81 yo man, NAD HEENT: Unremarkable Neck:  7 cm JVD, no thyromegally Back:  No CVA tenderness Lungs:  Clear with no wheezes HEART:  Regular rate rhythm, no murmurs, no rubs, no clicks Abd:  soft, positive bowel sounds, no organomegally, no rebound, no guarding Ext:  2 plus pulses, no edema, no cyanosis, no clubbing Skin:  No rashes no nodules Neuro:  CN II through XII intact, motor grossly intact  DEVICE  Normal device function.  See PaceArt for details.   Assess/Plan:

## 2014-12-11 NOTE — Assessment & Plan Note (Signed)
HIs medtronic DDD PM is working normally. Will recheck in several months.

## 2014-12-11 NOTE — Patient Instructions (Signed)
Medication Instructions:  Your physician recommends that you continue on your current medications as directed. Please refer to the Current Medication list given to you today.   Labwork: None ordered  Testing/Procedures: None ordered  Follow-Up: Your physician wants you to follow-up in: June with Dr Knox Saliva will receive a reminder letter in the mail two months in advance. If you don't receive a letter, please call our office to schedule the follow-up appointment.  Remote monitoring is used to monitor your Pacemaker  from home. This monitoring reduces the number of office visits required to check your device to one time per year. It allows [place] to keep an eye on the functioning of your device to ensure it is working properly. You are scheduled for a device check from home on 03/12/15. You may send your transmission at any time that day. If you have a wireless device, the transmission will be sent automatically. After your physician reviews your transmission, you will receive a postcard with your next transmission date.    Any Other Special Instructions Will Be Listed Below (If Applicable).

## 2014-12-11 NOTE — Assessment & Plan Note (Signed)
His blood pressure is well controlled. No change in meds.  

## 2014-12-11 NOTE — Assessment & Plan Note (Signed)
His ventricular rate is controlled. He is currently maintaining NSR. He will remain on Xarelto.

## 2014-12-20 LAB — CBC
HCT: 32.9 % — ABNORMAL LOW (ref 39.0–52.0)
Hemoglobin: 9.9 g/dL — ABNORMAL LOW (ref 13.0–17.0)
MCH: 17.3 pg — ABNORMAL LOW (ref 26.0–34.0)
MCHC: 30.1 g/dL (ref 30.0–36.0)
MCV: 57.4 fL — ABNORMAL LOW (ref 78.0–100.0)
Platelets: 212 10*3/uL (ref 150–400)
RBC: 5.73 MIL/uL (ref 4.22–5.81)
RDW: 20.2 % — ABNORMAL HIGH (ref 11.5–15.5)
WBC: 6.2 10*3/uL (ref 4.0–10.5)

## 2015-01-21 DIAGNOSIS — H43391 Other vitreous opacities, right eye: Secondary | ICD-10-CM | POA: Insufficient documentation

## 2015-01-21 DIAGNOSIS — H26491 Other secondary cataract, right eye: Secondary | ICD-10-CM | POA: Insufficient documentation

## 2015-01-21 DIAGNOSIS — H01021 Squamous blepharitis right upper eyelid: Secondary | ICD-10-CM | POA: Insufficient documentation

## 2015-01-21 DIAGNOSIS — H01024 Squamous blepharitis left upper eyelid: Secondary | ICD-10-CM

## 2015-01-21 DIAGNOSIS — H35371 Puckering of macula, right eye: Secondary | ICD-10-CM | POA: Insufficient documentation

## 2015-02-12 ENCOUNTER — Other Ambulatory Visit (HOSPITAL_BASED_OUTPATIENT_CLINIC_OR_DEPARTMENT_OTHER): Payer: Medicare Other

## 2015-02-12 DIAGNOSIS — D509 Iron deficiency anemia, unspecified: Secondary | ICD-10-CM

## 2015-02-12 DIAGNOSIS — D568 Other thalassemias: Secondary | ICD-10-CM

## 2015-02-12 LAB — CBC WITH DIFFERENTIAL/PLATELET
BASO%: 0.6 % (ref 0.0–2.0)
BASOS ABS: 0 10*3/uL (ref 0.0–0.1)
EOS ABS: 0.2 10*3/uL (ref 0.0–0.5)
EOS%: 3.9 % (ref 0.0–7.0)
HCT: 35.5 % — ABNORMAL LOW (ref 38.4–49.9)
HGB: 10.8 g/dL — ABNORMAL LOW (ref 13.0–17.1)
LYMPH%: 37.2 % (ref 14.0–49.0)
MCH: 17.8 pg — AB (ref 27.2–33.4)
MCHC: 30.3 g/dL — ABNORMAL LOW (ref 32.0–36.0)
MCV: 58.7 fL — AB (ref 79.3–98.0)
MONO#: 0.6 10*3/uL (ref 0.1–0.9)
MONO%: 11.2 % (ref 0.0–14.0)
NEUT#: 2.3 10*3/uL (ref 1.5–6.5)
NEUT%: 47.1 % (ref 39.0–75.0)
PLATELETS: 114 10*3/uL — AB (ref 140–400)
RBC: 6.05 10*6/uL — ABNORMAL HIGH (ref 4.20–5.82)
RDW: 22.2 % — ABNORMAL HIGH (ref 11.0–14.6)
WBC: 5 10*3/uL (ref 4.0–10.3)
lymph#: 1.8 10*3/uL (ref 0.9–3.3)
nRBC: 0 % (ref 0–0)

## 2015-02-12 LAB — IRON AND TIBC CHCC
%SAT: 24 % (ref 20–55)
IRON: 119 ug/dL (ref 42–163)
TIBC: 495 ug/dL — ABNORMAL HIGH (ref 202–409)
UIBC: 376 ug/dL (ref 117–376)

## 2015-02-12 LAB — TECHNOLOGIST REVIEW

## 2015-02-12 LAB — FERRITIN CHCC: Ferritin: 7 ng/ml — ABNORMAL LOW (ref 22–316)

## 2015-02-14 ENCOUNTER — Telehealth: Payer: Self-pay | Admitting: *Deleted

## 2015-02-14 ENCOUNTER — Encounter: Payer: Self-pay | Admitting: Internal Medicine

## 2015-02-14 ENCOUNTER — Ambulatory Visit (HOSPITAL_BASED_OUTPATIENT_CLINIC_OR_DEPARTMENT_OTHER): Payer: Medicare Other | Admitting: Internal Medicine

## 2015-02-14 VITALS — BP 135/71 | HR 84 | Temp 97.9°F | Ht 67.0 in | Wt 211.7 lb

## 2015-02-14 DIAGNOSIS — D509 Iron deficiency anemia, unspecified: Secondary | ICD-10-CM

## 2015-02-14 DIAGNOSIS — D563 Thalassemia minor: Secondary | ICD-10-CM

## 2015-02-14 NOTE — Telephone Encounter (Signed)
Voicemail: "I saw Dr. Julien Nordmann today, was asked to come in [place] for blood transfusion.  My wife and I looked at out schedules and Monday morning,  Call my mobile [phone] or home number with time."

## 2015-02-14 NOTE — Addendum Note (Signed)
Addended by: Curt Bears on: 02/14/2015 05:21 PM   Modules accepted: Orders

## 2015-02-14 NOTE — Progress Notes (Signed)
[facility] [phone]   [phone]  OFFICE PROGRESS NOTE  [name], MD [place] [address]  DIAGNOSIS: History of beta thalassemia minor and iron deficiency anemia  PRIOR THERAPY: None  CURRENT THERAPY: Integra plus 1 capsule by mouth daily.  INTERVAL HISTORY: Jacob [name] 81 y.o. male returns to the clinic today for followup visit. The patient is feeling the same as the last time he was seen here with no fatigue or weakness. He started his treatment with Integra plus on daily basis few months ago. He denied having any significant fever or chills, no nausea or vomiting. He has no chest pain, shortness of breath, cough or hemoptysis. He has no significant weight loss or night sweats. He had repeat CBC, iron study and ferritin performed recently and he is here for evaluation and discussion of his lab results.  MEDICAL HISTORY: Past Medical History  Diagnosis Date  . Benign prostatic hypertrophy     hx of  . Diverticulosis of colon (without mention of hemorrhage)   . Other thalassemia   . Gout   . Anemia   . GERD (gastroesophageal reflux disease)   . Colon polyp 2006, 2010    TUBULAR ADENOMA  . Presence of permanent cardiac pacemaker   . History of blood transfusion 12/1953; 03/1954    Transfused 7 pints blood '55, 4 pints '56  . MVP (mitral valve prolapse)   . First degree atrioventricular block   . Pneumonia "a number of times"  . OSA (obstructive sleep apnea)     "stopped wearing mask after I lost 100#"  . History of duodenal ulcer 1955    w/hemorrhage  . Osteoarthritis   . Arthritis     "middle finger both hands" (09/05/2014)  . Basal cell carcinoma   . Prostate cancer   . Kidney stones     "passed them"  . Morbid obesity     "I was 294; lost 100# 2014-2015"    ALLERGIES:  is allergic to prednisone and sulfonamide derivatives.  MEDICATIONS:  Current Outpatient Prescriptions  Medication  Sig Dispense Refill  . acetaminophen (TYLENOL) 500 MG tablet Take 500 mg by mouth every 6 (six) hours as needed (pain).     Marland Kitchen allopurinol (ZYLOPRIM) 300 MG tablet Take 1 tablet (300 mg total) by mouth daily. 90 tablet 3  . Ascorbic Acid (VITAMIN C PO) Take 1 capsule by mouth daily as needed (supplement).    Marland Kitchen b complex vitamins capsule Take 1 capsule by mouth daily.    . Cholecalciferol (VITAMIN D3) 2000 UNITS capsule Take 1 capsule (2,000 Units total) by mouth daily.    . Coenzyme Q10 300 MG CAPS Take 300 mg by mouth daily.    . colchicine 0.6 MG tablet Take 0.6 mg by mouth daily as needed (gout).     . Cyanocobalamin (VITAMIN B 12 PO) Take 1,000 mg by mouth daily.     Marland Kitchen FeFum-FePoly-FA-B Cmp-C-Biot (INTEGRA PLUS) CAPS Take 1 capsule by mouth every morning. 30 capsule 3  . fexofenadine (ALLEGRA) 180 MG tablet Take 180 mg by mouth daily.     . furosemide (LASIX) 20 MG tablet Take 1 tablet (20 mg total) by mouth daily. 30 tablet 6  . ibuprofen (ADVIL,MOTRIN) 200 MG tablet Take 200 mg by mouth every 6 (six) hours as needed for pain.    . Omega-3 Fatty Acids (FISH OIL) 600 MG CAPS Take 600 mg by mouth daily.     Marland Kitchen  rivaroxaban (XARELTO) 20 MG TABS tablet Take 1 tablet (20 mg total) by mouth daily with supper. 30 tablet 11  . tamsulosin (FLOMAX) 0.4 MG CAPS capsule Take 0.4 mg by mouth daily.  11   No current facility-administered medications for this visit.    SURGICAL HISTORY:  Past Surgical History  Procedure Laterality Date  . Tumor removal Left 2002    from finger- left index  . Mole removal Left 2008    "forearm"  . Cataract extraction w/ intraocular lens  implant, bilateral  2002-2004  . Gastrectomy  03/1954    Transfused 7 pints blood '55, 4 pints '56  . Colonoscopy  2015    multiple   . Esophagogastroduodenoscopy  2011    multiple  . Ep implantable device N/A 09/05/2014    Procedure: Pacemaker Implant;  Surgeon: [name], MD;  Location: [facility];  Service:  Cardiovascular;  Laterality: N/A;  . Insert / replace / remove pacemaker  09/05/2014  . Closed reduction ankle dislocation Right 1980's    "toe my achilles"    REVIEW OF SYSTEMS:  A comprehensive review of systems was negative.   PHYSICAL EXAMINATION: General appearance: alert, cooperative and no distress Head: Normocephalic, without obvious abnormality, atraumatic Neck: no adenopathy, no JVD, supple, symmetrical, trachea midline and thyroid not enlarged, symmetric, no tenderness/mass/nodules Lymph nodes: Cervical, supraclavicular, and axillary nodes normal. Resp: clear to auscultation bilaterally Back: symmetric, no curvature. ROM normal. No CVA tenderness. Cardio: regular rate and rhythm, S1, S2 normal, no murmur, click, rub or gallop GI: soft, non-tender; bowel sounds normal; no masses,  no organomegaly Extremities: extremities normal, atraumatic, no cyanosis or edema  ECOG PERFORMANCE STATUS: 0 - Asymptomatic  Blood pressure 135/71, pulse 84, temperature 97.9 F (36.6 C), temperature source Oral, height 5\' 7"  (1.702 m), weight 211 lb 11.2 oz (96.026 kg), SpO2 99 %.  LABORATORY DATA: Lab Results  Component Value Date   WBC 5.0 02/12/2015   HGB 10.8* 02/12/2015   HCT 35.5* 02/12/2015   MCV 58.7* 02/12/2015   PLT 114* 02/12/2015      Chemistry      Component Value Date/Time   NA 138 08/27/2014 0916   NA 141 12/12/2012 1522   K 4.6 08/27/2014 0916   K 4.2 12/12/2012 1522   CL 103 08/27/2014 0916   CO2 27 08/27/2014 0916   CO2 24 12/12/2012 1522   BUN 32* 08/27/2014 0916   BUN 22.4 12/12/2012 1522   CREATININE 0.96 08/27/2014 0916   CREATININE 0.90 05/25/2014 1030   CREATININE 1.1 12/12/2012 1522      Component Value Date/Time   CALCIUM 9.6 08/27/2014 0916   CALCIUM 9.7 12/12/2012 1522   ALKPHOS 70 05/03/2013 1300   ALKPHOS 86 12/12/2012 1522   AST 26 05/03/2013 1300   AST 21 12/12/2012 1522   ALT 20 05/03/2013 1300   ALT 19 12/12/2012 1522   BILITOT 1.9*  05/03/2013 1300   BILITOT 0.96 12/12/2012 1522     Other lab results: Ferritin 7, serum iron 119, total iron binding capacity 495 and iron saturation 24%.  RADIOGRAPHIC STUDIES: No results found.  ASSESSMENT AND PLAN: This is a very pleasant 81 years old white male with beta thalassemia minor as well as iron deficiency anemia. He has mild anemia consistent with his history of thalassemia minor. He continues to have mild anemia with normal iron study but his serum ferritin is low.  I will arrange for the patient to receive Feraheme infusion  for 2 doses. He will call in the next few days with the appropriate time for him to receive the infusion. I  I also recommended for the patient to continue his treatment with Integra plus 1 capsule by mouth daily. I would see him back for followup visit in 2 months with repeat CBC, iron study and ferritin. He was advised to call immediately if he has any concerning symptoms in the interval. The patient voices understanding of current disease status and treatment options and is in agreement with the current care plan.  All questions were answered. The patient knows to call the clinic with any problems, questions or concerns. We can certainly see the patient much sooner if necessary.  Disclaimer: This note was dictated with voice recognition software. Similar sounding words can inadvertently be transcribed and may not be corrected upon review.

## 2015-02-15 ENCOUNTER — Encounter: Payer: Self-pay | Admitting: Cardiology

## 2015-02-15 ENCOUNTER — Telehealth: Payer: Self-pay | Admitting: Internal Medicine

## 2015-02-15 NOTE — Telephone Encounter (Signed)
s.w. pt and advised on DEC and Feb 2017 appt.Marland KitchenMarland KitchenMarland KitchenMarland Kitchenpt ok and aware

## 2015-02-19 ENCOUNTER — Ambulatory Visit: Payer: Medicare Other

## 2015-02-19 DIAGNOSIS — H33301 Unspecified retinal break, right eye: Secondary | ICD-10-CM | POA: Insufficient documentation

## 2015-02-19 DIAGNOSIS — H43811 Vitreous degeneration, right eye: Secondary | ICD-10-CM | POA: Insufficient documentation

## 2015-02-19 DIAGNOSIS — Z961 Presence of intraocular lens: Secondary | ICD-10-CM | POA: Insufficient documentation

## 2015-02-22 ENCOUNTER — Ambulatory Visit (HOSPITAL_BASED_OUTPATIENT_CLINIC_OR_DEPARTMENT_OTHER): Payer: Medicare Other

## 2015-02-22 VITALS — BP 126/62 | HR 57 | Temp 98.2°F | Resp 18

## 2015-02-22 DIAGNOSIS — D509 Iron deficiency anemia, unspecified: Secondary | ICD-10-CM | POA: Diagnosis not present

## 2015-02-22 MED ORDER — SODIUM CHLORIDE 0.9 % IV SOLN
510.0000 mg | Freq: Once | INTRAVENOUS | Status: AC
  Administered 2015-02-22: 510 mg via INTRAVENOUS
  Filled 2015-02-22: qty 17

## 2015-02-22 MED ORDER — SODIUM CHLORIDE 0.9 % IV SOLN
Freq: Once | INTRAVENOUS | Status: AC
  Administered 2015-02-22: 11:00:00 via INTRAVENOUS

## 2015-02-22 NOTE — Patient Instructions (Signed)

## 2015-02-25 ENCOUNTER — Telehealth: Payer: Self-pay | Admitting: Medical Oncology

## 2015-02-25 NOTE — Telephone Encounter (Signed)
Was he tested for vit d deficiency. Note to mohamed.

## 2015-02-26 ENCOUNTER — Ambulatory Visit: Payer: Medicare Other

## 2015-02-26 ENCOUNTER — Telehealth: Payer: Self-pay | Admitting: Medical Oncology

## 2015-02-26 NOTE — Telephone Encounter (Signed)
-----   Message from Curt Bears, MD sent at 02/25/2015  4:26 PM EST ----- Regarding: RE: vit d test No. But if he needs it, his pcp can order it. ----- Message -----    From: Ardeen Garland, RN    Sent: 02/25/2015   3:47 PM      To: Curt Bears, MD Subject: vit d test                                     Was he tested for vit d deficiency?

## 2015-02-26 NOTE — Telephone Encounter (Signed)
Pt.notified

## 2015-03-01 ENCOUNTER — Ambulatory Visit (HOSPITAL_BASED_OUTPATIENT_CLINIC_OR_DEPARTMENT_OTHER): Payer: Medicare Other

## 2015-03-01 VITALS — BP 126/82 | HR 55 | Temp 98.8°F | Resp 20

## 2015-03-01 DIAGNOSIS — D509 Iron deficiency anemia, unspecified: Secondary | ICD-10-CM | POA: Diagnosis not present

## 2015-03-01 MED ORDER — SODIUM CHLORIDE 0.9 % IV SOLN
510.0000 mg | Freq: Once | INTRAVENOUS | Status: AC
  Administered 2015-03-01: 510 mg via INTRAVENOUS
  Filled 2015-03-01: qty 17

## 2015-03-01 MED ORDER — SODIUM CHLORIDE 0.9 % IV SOLN
Freq: Once | INTRAVENOUS | Status: AC
  Administered 2015-03-01: 11:00:00 via INTRAVENOUS

## 2015-03-01 NOTE — Patient Instructions (Signed)

## 2015-03-12 ENCOUNTER — Telehealth: Payer: Self-pay | Admitting: Cardiology

## 2015-03-12 ENCOUNTER — Ambulatory Visit (INDEPENDENT_AMBULATORY_CARE_PROVIDER_SITE_OTHER): Payer: Medicare Other | Admitting: *Deleted

## 2015-03-12 DIAGNOSIS — I442 Atrioventricular block, complete: Secondary | ICD-10-CM

## 2015-03-12 NOTE — Progress Notes (Signed)
Remote pacemaker transmission.   

## 2015-03-12 NOTE — Telephone Encounter (Signed)
Confirmed remote transmission w/ pt wife.   

## 2015-03-13 LAB — CUP PACEART REMOTE DEVICE CHECK
Brady Statistic AP VP Percent: 73.17 %
Brady Statistic AP VS Percent: 2.13 %
Brady Statistic AS VP Percent: 23.35 %
Brady Statistic AS VS Percent: 1.35 %
Brady Statistic RA Percent Paced: 75.3 %
Brady Statistic RV Percent Paced: 96.52 %
Implantable Lead Implant Date: 20160622
Implantable Lead Location: 753859
Implantable Lead Location: 753860
Implantable Lead Model: 5076
Lead Channel Impedance Value: 399 Ohm
Lead Channel Impedance Value: 437 Ohm
Lead Channel Impedance Value: 494 Ohm
Lead Channel Pacing Threshold Amplitude: 0.5 V
Lead Channel Sensing Intrinsic Amplitude: 14.5 mV
Lead Channel Sensing Intrinsic Amplitude: 14.5 mV
Lead Channel Sensing Intrinsic Amplitude: 5.375 mV
Lead Channel Setting Pacing Amplitude: 2 V
Lead Channel Setting Pacing Pulse Width: 0.4 ms
Lead Channel Setting Sensing Sensitivity: 4 mV
MDC IDC LEAD IMPLANT DT: 20160622
MDC IDC MSMT BATTERY REMAINING LONGEVITY: 102 mo
MDC IDC MSMT BATTERY VOLTAGE: 3.02 V
MDC IDC MSMT LEADCHNL RA IMPEDANCE VALUE: 304 Ohm
MDC IDC MSMT LEADCHNL RA SENSING INTR AMPL: 5.375 mV
MDC IDC MSMT LEADCHNL RV PACING THRESHOLD PULSEWIDTH: 0.4 ms
MDC IDC SESS DTM: 20161227194348
MDC IDC SET LEADCHNL RV PACING AMPLITUDE: 2.5 V

## 2015-03-14 ENCOUNTER — Encounter: Payer: Self-pay | Admitting: Cardiology

## 2015-03-19 NOTE — Telephone Encounter (Signed)
Spoke with patient regarding presenting rhythm on 12/27 and 9/27. I also informed patient that he has not had any episodes since his 9/27 appt. Patient voiced understanding and appreciation of call.

## 2015-03-21 ENCOUNTER — Encounter: Payer: Self-pay | Admitting: Cardiology

## 2015-03-25 ENCOUNTER — Encounter: Payer: Self-pay | Admitting: Internal Medicine

## 2015-03-27 ENCOUNTER — Other Ambulatory Visit: Payer: Self-pay | Admitting: *Deleted

## 2015-03-27 DIAGNOSIS — D568 Other thalassemias: Secondary | ICD-10-CM

## 2015-03-27 MED ORDER — INTEGRA PLUS PO CAPS: 1.0000 | ORAL_CAPSULE | Freq: Every morning | ORAL | Status: DC

## 2015-04-17 ENCOUNTER — Telehealth: Payer: Self-pay | Admitting: Internal Medicine

## 2015-04-17 NOTE — Telephone Encounter (Signed)
Pt would like to reschedule his 05-14-15 appt to maybe  05-06-15 please.

## 2015-04-17 NOTE — Telephone Encounter (Signed)
Explained to pt that if we moved his 06-11-15 remote check up his insurance would not cover the interrogation. Pt verbalized understanding and did not want to reschedule the remote.

## 2015-04-25 ENCOUNTER — Other Ambulatory Visit (HOSPITAL_BASED_OUTPATIENT_CLINIC_OR_DEPARTMENT_OTHER): Payer: Medicare Other

## 2015-04-25 DIAGNOSIS — D509 Iron deficiency anemia, unspecified: Secondary | ICD-10-CM

## 2015-04-25 LAB — CBC WITH DIFFERENTIAL/PLATELET
BASO%: 0.4 % (ref 0.0–2.0)
BASOS ABS: 0 10*3/uL (ref 0.0–0.1)
EOS%: 3 % (ref 0.0–7.0)
Eosinophils Absolute: 0.2 10*3/uL (ref 0.0–0.5)
HCT: 34.3 % — ABNORMAL LOW (ref 38.4–49.9)
HGB: 11.2 g/dL — ABNORMAL LOW (ref 13.0–17.1)
LYMPH%: 36 % (ref 14.0–49.0)
MCH: 19.8 pg — AB (ref 27.2–33.4)
MCHC: 32.7 g/dL (ref 32.0–36.0)
MCV: 60.6 fL — AB (ref 79.3–98.0)
MONO#: 0.4 10*3/uL (ref 0.1–0.9)
MONO%: 7.5 % (ref 0.0–14.0)
NEUT#: 2.8 10*3/uL (ref 1.5–6.5)
NEUT%: 53.1 % (ref 39.0–75.0)
Platelets: 116 10*3/uL — ABNORMAL LOW (ref 140–400)
RBC: 5.66 10*6/uL (ref 4.20–5.82)
RDW: 19 % — ABNORMAL HIGH (ref 11.0–14.6)
WBC: 5.3 10*3/uL (ref 4.0–10.3)
lymph#: 1.9 10*3/uL (ref 0.9–3.3)
nRBC: 0 % (ref 0–0)

## 2015-04-25 LAB — FERRITIN: FERRITIN: 69 ng/mL (ref 22–316)

## 2015-04-25 LAB — IRON AND TIBC
%SAT: 41 % (ref 20–55)
IRON: 141 ug/dL (ref 42–163)
TIBC: 345 ug/dL (ref 202–409)
UIBC: 204 ug/dL (ref 117–376)

## 2015-04-30 ENCOUNTER — Other Ambulatory Visit: Payer: Self-pay | Admitting: Cardiology

## 2015-04-30 NOTE — Telephone Encounter (Signed)
Rx(s) sent to pharmacy electronically.  

## 2015-05-02 ENCOUNTER — Telehealth: Payer: Self-pay | Admitting: Internal Medicine

## 2015-05-02 ENCOUNTER — Encounter: Payer: Self-pay | Admitting: Internal Medicine

## 2015-05-02 ENCOUNTER — Ambulatory Visit (HOSPITAL_BASED_OUTPATIENT_CLINIC_OR_DEPARTMENT_OTHER): Payer: Medicare Other | Admitting: Internal Medicine

## 2015-05-02 VITALS — BP 127/64 | HR 82 | Temp 97.5°F | Resp 18 | Ht 67.0 in | Wt 217.9 lb

## 2015-05-02 DIAGNOSIS — D509 Iron deficiency anemia, unspecified: Secondary | ICD-10-CM | POA: Diagnosis not present

## 2015-05-02 DIAGNOSIS — D561 Beta thalassemia: Secondary | ICD-10-CM

## 2015-05-02 NOTE — Progress Notes (Signed)
[place] [phone]   [phone]  OFFICE PROGRESS NOTE  Jacob Pao, MD [place] [address]  DIAGNOSIS: History of beta thalassemia minor and iron deficiency anemia  PRIOR THERAPY: Status post Feraheme infusion 510 mg IV last dose was given on 03/01/2015.  CURRENT THERAPY: Integra plus 1 capsule by mouth daily.  INTERVAL HISTORY: Jacob [name] 82 y.o. male returns to the clinic today for 2 months followup visit. The patient is feeling fine today with no specific complaints. He received Feraheme infusion in December 2016 for severe iron deficiency. He did not notice significant change in his general condition. He he is currently on treatment with Integra plus on daily basis and tolerating it well except for constipation. He takes a laxative on daily basis. He denied having any significant fever or chills, no nausea or vomiting. He has no chest pain, shortness of breath, cough or hemoptysis. He has no significant weight loss or night sweats. He had repeat CBC, iron study and ferritin performed recently and he is here for evaluation and discussion of his lab results.  MEDICAL HISTORY: Past Medical History  Diagnosis Date  . Benign prostatic hypertrophy     hx of  . Diverticulosis of colon (without mention of hemorrhage)   . Other thalassemia ([place])   . Gout   . Anemia   . GERD (gastroesophageal reflux disease)   . Colon polyp 2006, 2010    TUBULAR ADENOMA  . Presence of permanent cardiac pacemaker   . History of blood transfusion 12/1953; 03/1954    Transfused 7 pints blood '55, 4 pints '56  . MVP (mitral valve prolapse)   . First degree atrioventricular block   . Pneumonia "a number of times"  . OSA (obstructive sleep apnea)     "stopped wearing mask after I lost 100#"  . History of duodenal ulcer 1955    w/hemorrhage  . Osteoarthritis   . Arthritis     "middle finger both hands" (09/05/2014)  . Basal  cell carcinoma   . Prostate cancer ([place])   . Kidney stones     "passed them"  . Morbid obesity ([place])     "I was 89; lost 100# 2014-2015"    ALLERGIES:  is allergic to prednisone and sulfonamide derivatives.  MEDICATIONS:  Current Outpatient Prescriptions  Medication Sig Dispense Refill  . acetaminophen (TYLENOL) 500 MG tablet Take 500 mg by mouth every 6 (six) hours as needed (pain).     Marland Kitchen allopurinol (ZYLOPRIM) 300 MG tablet Take 1 tablet (300 mg total) by mouth daily. 90 tablet 3  . Ascorbic Acid (VITAMIN C PO) Take 1 capsule by mouth daily as needed (supplement).    Marland Kitchen b complex vitamins capsule Take 1 capsule by mouth daily.    . Cholecalciferol (VITAMIN D3) 2000 UNITS capsule Take 1 capsule (2,000 Units total) by mouth daily.    . Coenzyme Q10 300 MG CAPS Take 300 mg by mouth daily.    . colchicine 0.6 MG tablet Take 0.6 mg by mouth daily as needed (gout).     . Cyanocobalamin (VITAMIN B 12 PO) Take 1,000 mg by mouth daily.     Marland Kitchen FeFum-FePoly-FA-B Cmp-C-Biot (INTEGRA PLUS) CAPS Take 1 capsule by mouth every morning. 30 capsule 5  . fexofenadine (ALLEGRA) 180 MG tablet Take 180 mg by mouth daily.     . furosemide (LASIX) 20 MG tablet TAKE 1 TABLET(20 MG) BY MOUTH DAILY 30  tablet 6  . ibuprofen (ADVIL,MOTRIN) 200 MG tablet Take 200 mg by mouth every 6 (six) hours as needed for pain.    . Omega-3 Fatty Acids (FISH OIL) 600 MG CAPS Take 600 mg by mouth daily.     . rivaroxaban (XARELTO) 20 MG TABS tablet Take 1 tablet (20 mg total) by mouth daily with supper. 30 tablet 11  . tamsulosin (FLOMAX) 0.4 MG CAPS capsule Take 0.4 mg by mouth daily.  11   No current facility-administered medications for this visit.    SURGICAL HISTORY:  Past Surgical History  Procedure Laterality Date  . Tumor removal Left 2002    from finger- left index  . Mole removal Left 2008    "forearm"  . Cataract extraction w/ intraocular lens  implant, bilateral  2002-2004  . Gastrectomy  03/1954     Transfused 7 pints blood '55, 4 pints '56  . Colonoscopy  2015    multiple   . Esophagogastroduodenoscopy  2011    multiple  . Ep implantable device N/A 09/05/2014    Procedure: Pacemaker Implant;  Surgeon: [name], MD;  Location: [facility];  Service: Cardiovascular;  Laterality: N/A;  . Insert / replace / remove pacemaker  09/05/2014  . Closed reduction ankle dislocation Right 1980's    "toe my achilles"    REVIEW OF SYSTEMS:  A comprehensive review of systems was negative.   PHYSICAL EXAMINATION: General appearance: alert, cooperative and no distress Head: Normocephalic, without obvious abnormality, atraumatic Neck: no adenopathy, no JVD, supple, symmetrical, trachea midline and thyroid not enlarged, symmetric, no tenderness/mass/nodules Lymph nodes: Cervical, supraclavicular, and axillary nodes normal. Resp: clear to auscultation bilaterally Back: symmetric, no curvature. ROM normal. No CVA tenderness. Cardio: regular rate and rhythm, S1, S2 normal, no murmur, click, rub or gallop GI: soft, non-tender; bowel sounds normal; no masses,  no organomegaly Extremities: extremities normal, atraumatic, no cyanosis or edema  ECOG PERFORMANCE STATUS: 0 - Asymptomatic  Blood pressure 127/64, pulse 82, temperature 97.5 F (36.4 C), temperature source Oral, resp. rate 18, height 5\' 7"  (1.702 m), weight 217 lb 14.4 oz (98.839 kg), SpO2 98 %.  LABORATORY DATA: Lab Results  Component Value Date   WBC 5.3 04/25/2015   HGB 11.2* 04/25/2015   HCT 34.3* 04/25/2015   MCV 60.6* 04/25/2015   PLT 116* 04/25/2015      Chemistry      Component Value Date/Time   NA 138 08/27/2014 0916   NA 141 12/12/2012 1522   K 4.6 08/27/2014 0916   K 4.2 12/12/2012 1522   CL 103 08/27/2014 0916   CO2 27 08/27/2014 0916   CO2 24 12/12/2012 1522   BUN 32* 08/27/2014 0916   BUN 22.4 12/12/2012 1522   CREATININE 0.96 08/27/2014 0916   CREATININE 0.90 05/25/2014 1030   CREATININE 1.1  12/12/2012 1522      Component Value Date/Time   CALCIUM 9.6 08/27/2014 0916   CALCIUM 9.7 12/12/2012 1522   ALKPHOS 70 05/03/2013 1300   ALKPHOS 86 12/12/2012 1522   AST 26 05/03/2013 1300   AST 21 12/12/2012 1522   ALT 20 05/03/2013 1300   ALT 19 12/12/2012 1522   BILITOT 1.9* 05/03/2013 1300   BILITOT 0.96 12/12/2012 1522     Other lab results: Ferritin 69, serum iron 141, total iron binding capacity 345 and iron saturation 41%.  RADIOGRAPHIC STUDIES: No results found.  ASSESSMENT AND PLAN: This is a very pleasant 82 years old white male  with beta thalassemia minor as well as iron deficiency anemia. He has mild anemia consistent with his history of thalassemia minor. He is status post Feraheme infusion and currently on oral iron tablets with Integra plus. The patient is feeling fine with no specific complaints except for occasional fatigue. His CBC today showed persistent mild anemia with hemoglobin of 11.2 but he also has normal iron study and ferritin. I  I also recommended for the patient to continue his treatment with Integra plus 1 capsule by mouth daily. I would see him back for followup visit in 3 months with repeat CBC, iron study and ferritin. He was advised to call immediately if he has any concerning symptoms in the interval. The patient voices understanding of current disease status and treatment options and is in agreement with the current care plan.  All questions were answered. The patient knows to call the clinic with any problems, questions or concerns. We can certainly see the patient much sooner if necessary.  Disclaimer: This note was dictated with voice recognition software. Similar sounding words can inadvertently be transcribed and may not be corrected upon review.

## 2015-05-02 NOTE — Telephone Encounter (Signed)
Pt confirmed labs/ov per 02/16 POF, gave pt AVS and Calendar.... KJ °

## 2015-06-06 ENCOUNTER — Ambulatory Visit: Payer: Medicare Other | Admitting: Cardiology

## 2015-06-11 ENCOUNTER — Ambulatory Visit (INDEPENDENT_AMBULATORY_CARE_PROVIDER_SITE_OTHER): Payer: Medicare Other | Admitting: *Deleted

## 2015-06-11 DIAGNOSIS — I442 Atrioventricular block, complete: Secondary | ICD-10-CM

## 2015-06-11 LAB — CUP PACEART REMOTE DEVICE CHECK
Battery Remaining Longevity: 103 mo
Battery Voltage: 3.01 V
Brady Statistic AS VP Percent: 23.23 %
Brady Statistic AS VS Percent: 0.96 %
Brady Statistic RA Percent Paced: 75.82 %
Date Time Interrogation Session: 20170328131631
Implantable Lead Implant Date: 20160622
Implantable Lead Implant Date: 20160622
Implantable Lead Location: 753859
Implantable Lead Location: 753860
Implantable Lead Model: 5076
Lead Channel Impedance Value: 285 Ohm
Lead Channel Pacing Threshold Amplitude: 0.625 V
Lead Channel Pacing Threshold Pulse Width: 0.4 ms
Lead Channel Pacing Threshold Pulse Width: 0.4 ms
Lead Channel Sensing Intrinsic Amplitude: 10.75 mV
Lead Channel Sensing Intrinsic Amplitude: 5.125 mV
Lead Channel Setting Pacing Pulse Width: 0.4 ms
MDC IDC MSMT LEADCHNL RA IMPEDANCE VALUE: 399 Ohm
MDC IDC MSMT LEADCHNL RA SENSING INTR AMPL: 5.125 mV
MDC IDC MSMT LEADCHNL RV IMPEDANCE VALUE: 342 Ohm
MDC IDC MSMT LEADCHNL RV IMPEDANCE VALUE: 418 Ohm
MDC IDC MSMT LEADCHNL RV PACING THRESHOLD AMPLITUDE: 0.75 V
MDC IDC MSMT LEADCHNL RV SENSING INTR AMPL: 10.75 mV
MDC IDC SET LEADCHNL RA PACING AMPLITUDE: 2 V
MDC IDC SET LEADCHNL RV PACING AMPLITUDE: 2.5 V
MDC IDC SET LEADCHNL RV SENSING SENSITIVITY: 4 mV
MDC IDC STAT BRADY AP VP PERCENT: 73.71 %
MDC IDC STAT BRADY AP VS PERCENT: 2.11 %
MDC IDC STAT BRADY RV PERCENT PACED: 96.93 %

## 2015-06-11 NOTE — Progress Notes (Signed)
Remote pacemaker transmission.   

## 2015-06-13 NOTE — Progress Notes (Signed)
HPI The patient presents for followup of his slow heart rate, fib/flutter.  He is status post pacemaker placement.  Since I last saw him he has done well. He's starting to slowly increase his activity. He's not as fatigued as he used to be although he doesn't have as much energy as he had when he was a younger man. He denies any acute cardiovascular symptoms. He's not been having any chest pressure, neck or arm discomfort. He's not been having any palpitations, presyncope or syncope.      Allergies  Allergen Reactions  . Prednisone     REACTION: GI bleed  . Sulfonamide Derivatives     REACTION: Rash    Current Outpatient Prescriptions  Medication Sig Dispense Refill  . acetaminophen (TYLENOL) 500 MG tablet Take 500 mg by mouth every 6 (six) hours as needed (pain).     Marland Kitchen allopurinol (ZYLOPRIM) 300 MG tablet Take 1 tablet (300 mg total) by mouth daily. 90 tablet 3  . Ascorbic Acid (VITAMIN C PO) Take 1 capsule by mouth daily as needed (supplement).    Marland Kitchen b complex vitamins capsule Take 1 capsule by mouth daily.    . Cholecalciferol (VITAMIN D3) 2000 UNITS capsule Take 1 capsule (2,000 Units total) by mouth daily.    . Coenzyme Q10 300 MG CAPS Take 300 mg by mouth daily.    . colchicine 0.6 MG tablet Take 0.6 mg by mouth daily as needed (gout).     . Cyanocobalamin (VITAMIN B 12 PO) Take 1,000 mg by mouth daily.     Marland Kitchen FeFum-FePoly-FA-B Cmp-C-Biot (INTEGRA PLUS) CAPS Take 1 capsule by mouth every morning. 30 capsule 5  . fexofenadine (ALLEGRA) 180 MG tablet Take 180 mg by mouth daily.     . furosemide (LASIX) 20 MG tablet TAKE 1 TABLET(20 MG) BY MOUTH DAILY 30 tablet 6  . ibuprofen (ADVIL,MOTRIN) 200 MG tablet Take 200 mg by mouth every 6 (six) hours as needed for pain.    . Omega-3 Fatty Acids (FISH OIL) 600 MG CAPS Take 600 mg by mouth daily.     . rivaroxaban (XARELTO) 20 MG TABS tablet Take 1 tablet (20 mg total) by mouth daily with supper. 30 tablet 11  . tamsulosin (FLOMAX) 0.4 MG  CAPS capsule Take 0.4 mg by mouth daily.  11   No current facility-administered medications for this visit.    Past Medical History  Diagnosis Date  . Benign prostatic hypertrophy     hx of  . Diverticulosis of colon (without mention of hemorrhage)   . Other thalassemia ([place])   . Gout   . Anemia   . GERD (gastroesophageal reflux disease)   . Colon polyp 2006, 2010    TUBULAR ADENOMA  . Presence of permanent cardiac pacemaker   . History of blood transfusion 12/1953; 03/1954    Transfused 7 pints blood '55, 4 pints '56  . MVP (mitral valve prolapse)   . First degree atrioventricular block   . Pneumonia "a number of times"  . OSA (obstructive sleep apnea)     "stopped wearing mask after I lost 100#"  . History of duodenal ulcer 1955    w/hemorrhage  . Osteoarthritis   . Arthritis     "middle finger both hands" (09/05/2014)  . Basal cell carcinoma   . Prostate cancer ([place])   . Kidney stones     "passed them"  . Morbid obesity ([place])     "I was 89; lost 100# 2014-2015"  Past Surgical History  Procedure Laterality Date  . Tumor removal Left 2002    from finger- left index  . Mole removal Left 2008    "forearm"  . Cataract extraction w/ intraocular lens  implant, bilateral  2002-2004  . Gastrectomy  03/1954    Transfused 7 pints blood '55, 4 pints '56  . Colonoscopy  2015    multiple   . Esophagogastroduodenoscopy  2011    multiple  . Ep implantable device N/A 09/05/2014    Procedure: Pacemaker Implant;  Surgeon: [name], MD;  Location: [facility];  Service: Cardiovascular;  Laterality: N/A;  . Insert / replace / remove pacemaker  09/05/2014  . Closed reduction ankle dislocation Right 1980's    "toe my achilles"    ROS:  As stated in the HPI and negative for all other systems.  PHYSICAL EXAM BP 114/64 mmHg  Pulse 74  Ht 5\' 7"  (1.702 m)  Wt 220 lb (99.791 kg)  BMI 34.45 kg/m2 GENERAL:  Well appearing NECK:  No jugular venous distention, waveform  within normal limits, carotid upstroke brisk and symmetric, no bruits, no thyromegaly LUNGS:  Clear to auscultation bilaterally CHEST:  Well healed pacemaker pocket.   HEART:  PMI not displaced or sustained,S1 and S2 within normal limits, no S3, no S4, no clicks, no rubs, no murmurs ABD:  Flat, positive bowel sounds normal in frequency in pitch, no bruits, no rebound, no guarding, no midline pulsatile mass, no hepatomegaly, no splenomegaly, obese EXT:  2 plus pulses throughout, mild ankle edema, no cyanosis no clubbing NEURO:  Nonfocal   ASSESSMENT AND PLAN  ATRIAL FIB -   [name] has a CHA2DS2 - VASc score of 2 with a risk of stroke of 2.2%.  No change in therapy is indicated. He tolerates his anticoagulation.  BRADYCARDIA - This is now treated post pacemaker  EDEMA - This is mild and stable. No change in therapy is indicated.  MR - This was mild to moderate in the past. No change in therapy is indicated.  I will follow-up echoes in the future.

## 2015-06-14 ENCOUNTER — Encounter: Payer: Self-pay | Admitting: Cardiology

## 2015-06-14 ENCOUNTER — Ambulatory Visit (INDEPENDENT_AMBULATORY_CARE_PROVIDER_SITE_OTHER): Payer: Medicare Other | Admitting: Cardiology

## 2015-06-14 VITALS — BP 114/64 | HR 74 | Ht 67.0 in | Wt 220.0 lb

## 2015-06-14 DIAGNOSIS — R001 Bradycardia, unspecified: Secondary | ICD-10-CM

## 2015-06-14 NOTE — Patient Instructions (Addendum)
Your physician wants you to follow-up in: December. You will receive a reminder letter in the mail two months in advance. If you don't receive a letter, please call our office to schedule the follow-up appointment.

## 2015-06-19 ENCOUNTER — Telehealth: Payer: Self-pay | Admitting: Internal Medicine

## 2015-06-19 NOTE — Telephone Encounter (Signed)
New message     4. Are you calling to see if we received your device transmission? Yes and the results

## 2015-06-19 NOTE — Telephone Encounter (Signed)
Returned patient's call.  Advised that his transmission showed no alerts or abnormalities.  Patient verbalized appreciation and denies additional questions or concerns at this time.

## 2015-06-25 ENCOUNTER — Encounter: Payer: Self-pay | Admitting: Cardiology

## 2015-07-26 ENCOUNTER — Other Ambulatory Visit (HOSPITAL_BASED_OUTPATIENT_CLINIC_OR_DEPARTMENT_OTHER): Payer: Medicare Other

## 2015-07-26 DIAGNOSIS — D509 Iron deficiency anemia, unspecified: Secondary | ICD-10-CM

## 2015-07-26 LAB — CBC WITH DIFFERENTIAL/PLATELET
BASO%: 0.8 % (ref 0.0–2.0)
BASOS ABS: 0 10*3/uL (ref 0.0–0.1)
EOS%: 3.6 % (ref 0.0–7.0)
Eosinophils Absolute: 0.2 10*3/uL (ref 0.0–0.5)
HCT: 33.8 % — ABNORMAL LOW (ref 38.4–49.9)
HEMOGLOBIN: 11.2 g/dL — AB (ref 13.0–17.1)
LYMPH#: 1.8 10*3/uL (ref 0.9–3.3)
LYMPH%: 37.3 % (ref 14.0–49.0)
MCH: 20.8 pg — ABNORMAL LOW (ref 27.2–33.4)
MCHC: 33.1 g/dL (ref 32.0–36.0)
MCV: 62.8 fL — AB (ref 79.3–98.0)
MONO#: 0.3 10*3/uL (ref 0.1–0.9)
MONO%: 6.3 % (ref 0.0–14.0)
NEUT%: 52 % (ref 39.0–75.0)
NEUTROS ABS: 2.5 10*3/uL (ref 1.5–6.5)
NRBC: 0 % (ref 0–0)
Platelets: 117 10*3/uL — ABNORMAL LOW (ref 140–400)
RBC: 5.38 10*6/uL (ref 4.20–5.82)
RDW: 16.8 % — AB (ref 11.0–14.6)
WBC: 4.7 10*3/uL (ref 4.0–10.3)

## 2015-07-26 LAB — IRON AND TIBC
%SAT: 44 % (ref 20–55)
IRON: 165 ug/dL — AB (ref 42–163)
TIBC: 373 ug/dL (ref 202–409)
UIBC: 207 ug/dL (ref 117–376)

## 2015-07-26 LAB — FERRITIN: FERRITIN: 54 ng/mL (ref 22–316)

## 2015-07-29 ENCOUNTER — Encounter: Payer: Self-pay | Admitting: Cardiology

## 2015-07-30 ENCOUNTER — Telehealth: Payer: Self-pay | Admitting: *Deleted

## 2015-07-30 MED ORDER — FUROSEMIDE 40 MG PO TABS: ORAL_TABLET | ORAL | Status: DC

## 2015-07-30 NOTE — Telephone Encounter (Signed)
Spoke with pt wife letting her know prescription for Lasix 40 mg was send into pt pharmacy and it is ok for pt to travel out of state in October.Marland Kitchen

## 2015-08-01 ENCOUNTER — Ambulatory Visit (HOSPITAL_BASED_OUTPATIENT_CLINIC_OR_DEPARTMENT_OTHER): Payer: Medicare Other | Admitting: Internal Medicine

## 2015-08-01 ENCOUNTER — Telehealth: Payer: Self-pay | Admitting: Internal Medicine

## 2015-08-01 ENCOUNTER — Encounter: Payer: Self-pay | Admitting: Internal Medicine

## 2015-08-01 VITALS — BP 144/75 | HR 78 | Temp 97.6°F | Resp 18 | Ht 67.0 in | Wt 216.1 lb

## 2015-08-01 DIAGNOSIS — D649 Anemia, unspecified: Secondary | ICD-10-CM

## 2015-08-01 DIAGNOSIS — D561 Beta thalassemia: Secondary | ICD-10-CM

## 2015-08-01 DIAGNOSIS — D509 Iron deficiency anemia, unspecified: Secondary | ICD-10-CM | POA: Diagnosis not present

## 2015-08-01 DIAGNOSIS — D568 Other thalassemias: Secondary | ICD-10-CM

## 2015-08-01 NOTE — Progress Notes (Signed)
[place] Telephone:([phone]   Fax:(336) [phone]  OFFICE PROGRESS NOTE  [name], MD [name] [address]  DIAGNOSIS: History of beta thalassemia minor and iron deficiency anemia  PRIOR THERAPY: Status post Feraheme infusion 510 mg IV last dose was given on 03/01/2015.  CURRENT THERAPY: Integra plus 1 capsule by mouth daily.  INTERVAL HISTORY: Jacob [name] 82 y.o. male returns to the clinic today for 3 months followup visit. The patient is feeling fine today with no specific complaints. He denied having any significant fatigue or weakness. He exercises at regular basis. His weight has been stable. He he is currently on treatment with Integra plus on daily basis and tolerating it well. He denied having any significant fever or chills, no nausea or vomiting. He has no chest pain, shortness of breath, cough or hemoptysis. He has no significant weight loss or night sweats. He had repeat CBC, iron study and ferritin performed recently and he is here for evaluation and discussion of his lab results.  MEDICAL HISTORY: Past Medical History  Diagnosis Date  . Benign prostatic hypertrophy     hx of  . Diverticulosis of colon (without mention of hemorrhage)   . Other thalassemia ([place])   . Gout   . Anemia   . GERD (gastroesophageal reflux disease)   . Colon polyp 2006, 2010    TUBULAR ADENOMA  . Presence of permanent cardiac pacemaker   . History of blood transfusion 12/1953; 03/1954    Transfused 7 pints blood '55, 4 pints '56  . MVP (mitral valve prolapse)   . First degree atrioventricular block   . Pneumonia "a number of times"  . OSA (obstructive sleep apnea)     "stopped wearing mask after I lost 100#"  . History of duodenal ulcer 1955    w/hemorrhage  . Osteoarthritis   . Arthritis     "middle finger both hands" (09/05/2014)  . Basal cell carcinoma   . Prostate cancer ([place])   . Kidney stones     "passed them"  .  Morbid obesity ([place])     "I was 89; lost 100# 2014-2015"    ALLERGIES:  is allergic to prednisone and sulfonamide derivatives.  MEDICATIONS:  Current Outpatient Prescriptions  Medication Sig Dispense Refill  . acetaminophen (TYLENOL) 500 MG tablet Take 500 mg by mouth every 6 (six) hours as needed (pain).     Marland Kitchen allopurinol (ZYLOPRIM) 300 MG tablet Take 1 tablet (300 mg total) by mouth daily. 90 tablet 3  . Ascorbic Acid (VITAMIN C PO) Take 1 capsule by mouth daily as needed (supplement).    Marland Kitchen b complex vitamins capsule Take 1 capsule by mouth daily.    . Cholecalciferol (VITAMIN D3) 2000 UNITS capsule Take 1 capsule (2,000 Units total) by mouth daily.    . Coenzyme Q10 300 MG CAPS Take 300 mg by mouth daily.    . colchicine 0.6 MG tablet Take 0.6 mg by mouth daily as needed (gout).     . Cyanocobalamin (VITAMIN B 12 PO) Take 1,000 mg by mouth daily.     Marland Kitchen FeFum-FePoly-FA-B Cmp-C-Biot (INTEGRA PLUS) CAPS Take 1 capsule by mouth every morning. 30 capsule 5  . fexofenadine (ALLEGRA) 180 MG tablet Take 180 mg by mouth daily.     . furosemide (LASIX) 40 MG tablet Take 1 tablets (40 mg) by mouth daily 30 tablet 6  . ibuprofen (ADVIL,MOTRIN) 200 MG tablet Take 200 mg  by mouth every 6 (six) hours as needed for pain.    . Omega-3 Fatty Acids (FISH OIL) 600 MG CAPS Take 600 mg by mouth daily.     . rivaroxaban (XARELTO) 20 MG TABS tablet Take 1 tablet (20 mg total) by mouth daily with supper. 30 tablet 11  . tamsulosin (FLOMAX) 0.4 MG CAPS capsule Take 0.4 mg by mouth daily.  11   No current facility-administered medications for this visit.    SURGICAL HISTORY:  Past Surgical History  Procedure Laterality Date  . Tumor removal Left 2002    from finger- left index  . Mole removal Left 2008    "forearm"  . Cataract extraction w/ intraocular lens  implant, bilateral  2002-2004  . Gastrectomy  03/1954    Transfused 7 pints blood '55, 4 pints '56  . Colonoscopy  2015    multiple   .  Esophagogastroduodenoscopy  2011    multiple  . Ep implantable device N/A 09/05/2014    Procedure: Pacemaker Implant;  Surgeon: [name], MD;  Location: [facility];  Service: Cardiovascular;  Laterality: N/A;  . Insert / replace / remove pacemaker  09/05/2014  . Closed reduction ankle dislocation Right 1980's    "toe my achilles"    REVIEW OF SYSTEMS:  A comprehensive review of systems was negative.   PHYSICAL EXAMINATION: General appearance: alert, cooperative and no distress Head: Normocephalic, without obvious abnormality, atraumatic Neck: no adenopathy, no JVD, supple, symmetrical, trachea midline and thyroid not enlarged, symmetric, no tenderness/mass/nodules Lymph nodes: Cervical, supraclavicular, and axillary nodes normal. Resp: clear to auscultation bilaterally Back: symmetric, no curvature. ROM normal. No CVA tenderness. Cardio: regular rate and rhythm, S1, S2 normal, no murmur, click, rub or gallop GI: soft, non-tender; bowel sounds normal; no masses,  no organomegaly Extremities: extremities normal, atraumatic, no cyanosis or edema  ECOG PERFORMANCE STATUS: 0 - Asymptomatic  There were no vitals taken for this visit.  LABORATORY DATA: Lab Results  Component Value Date   WBC 4.7 07/26/2015   HGB 11.2* 07/26/2015   HCT 33.8* 07/26/2015   MCV 62.8* 07/26/2015   PLT 117* 07/26/2015      Chemistry      Component Value Date/Time   NA 138 08/27/2014 0916   NA 141 12/12/2012 1522   K 4.6 08/27/2014 0916   K 4.2 12/12/2012 1522   CL 103 08/27/2014 0916   CO2 27 08/27/2014 0916   CO2 24 12/12/2012 1522   BUN 32* 08/27/2014 0916   BUN 22.4 12/12/2012 1522   CREATININE 0.96 08/27/2014 0916   CREATININE 0.90 05/25/2014 1030   CREATININE 1.1 12/12/2012 1522      Component Value Date/Time   CALCIUM 9.6 08/27/2014 0916   CALCIUM 9.7 12/12/2012 1522   ALKPHOS 70 05/03/2013 1300   ALKPHOS 86 12/12/2012 1522   AST 26 05/03/2013 1300   AST 21 12/12/2012  1522   ALT 20 05/03/2013 1300   ALT 19 12/12/2012 1522   BILITOT 1.9* 05/03/2013 1300   BILITOT 0.96 12/12/2012 1522     Other lab results: Ferritin 54, serum iron 165, total iron binding capacity 373 and iron saturation 44%.  RADIOGRAPHIC STUDIES: No results found.  ASSESSMENT AND PLAN: This is a very pleasant 82 years old white male with beta thalassemia minor as well as iron deficiency anemia. He has mild anemia consistent with his history of thalassemia minor. He is status post Feraheme infusion and currently on oral iron tablets with Integra  plus. The patient is feeling fine with no specific complaints. His CBC today showed persistent mild anemia with stable hemoglobin of 11.2 but he also has normal iron study and ferritin. I  I also recommended for the patient to continue his treatment with Integra plus 1 capsule by mouth daily. I would see him back for followup visit in 6 months with repeat CBC, iron study and ferritin. He was advised to call immediately if he has any concerning symptoms in the interval. The patient voices understanding of current disease status and treatment options and is in agreement with the current care plan.  All questions were answered. The patient knows to call the clinic with any problems, questions or concerns. We can certainly see the patient much sooner if necessary.  Disclaimer: This note was dictated with voice recognition software. Similar sounding words can inadvertently be transcribed and may not be corrected upon review.

## 2015-08-01 NOTE — Telephone Encounter (Signed)
Gave and printed appt sched appt sched and avs for pt for NOV

## 2015-09-01 ENCOUNTER — Other Ambulatory Visit: Payer: Self-pay | Admitting: Internal Medicine

## 2015-09-09 ENCOUNTER — Ambulatory Visit (INDEPENDENT_AMBULATORY_CARE_PROVIDER_SITE_OTHER): Payer: Medicare Other | Admitting: Internal Medicine

## 2015-09-09 ENCOUNTER — Encounter: Payer: Self-pay | Admitting: Internal Medicine

## 2015-09-09 VITALS — BP 138/68 | HR 61 | Ht 67.0 in | Wt 220.0 lb

## 2015-09-09 DIAGNOSIS — I480 Paroxysmal atrial fibrillation: Secondary | ICD-10-CM | POA: Diagnosis not present

## 2015-09-09 NOTE — Patient Instructions (Signed)
Medication Instructions:  Your physician recommends that you continue on your current medications as directed. Please refer to the Current Medication list given to you today.  Labwork: NONE  Testing/Procedures: NONE  Follow-Up: Your physician wants you to follow-up in: [place] 12-09-15  You will receive a reminder letter in the mail two months in advance. If you don't receive a letter, please call our office to schedule the follow-up appointment.  Any Other Special Instructions Will Be Listed Below (If Applicable).     If you need a refill on your cardiac medications before your next appointment, please call your pharmacy.

## 2015-09-09 NOTE — Progress Notes (Signed)
HPI Mr. Mertens returns today for followup. He is a 82 yo man who underwent PPM insertion over a year ago and subsequently developed irritation of his skin. He was given an anti-biotic ointment that made thinks even worse. He has had an episode of palpitations and feeling strange that lasted several hours which coincides with atrial fib with an CVR. He has not had syncope. No sob. No fever or chills. He has restarted exercising but he has not gotten back to his baseline level of activity. He has had very minimal recurrent atrial fib. Allergies  Allergen Reactions  . Prednisone     REACTION: GI bleed  . Sulfonamide Derivatives     REACTION: Rash     Current Outpatient Prescriptions  Medication Sig Dispense Refill  . acetaminophen (TYLENOL) 500 MG tablet Take 500 mg by mouth every 6 (six) hours as needed (pain).     Marland Kitchen allopurinol (ZYLOPRIM) 300 MG tablet Take 1 tablet (300 mg total) by mouth daily. 90 tablet 3  . Ascorbic Acid (VITAMIN C PO) Take 1 capsule by mouth daily as needed (supplement).    Marland Kitchen b complex vitamins capsule Take 1 capsule by mouth daily.    . Cholecalciferol (VITAMIN D3) 2000 UNITS capsule Take 1 capsule (2,000 Units total) by mouth daily.    . Coenzyme Q10 300 MG CAPS Take 300 mg by mouth daily.    . colchicine 0.6 MG tablet Take 0.6 mg by mouth daily as needed (gout).     . Cyanocobalamin (VITAMIN B 12 PO) Take 1,000 mg by mouth daily.     Marland Kitchen FeFum-FePoly-FA-B Cmp-C-Biot (INTEGRA PLUS) CAPS Take 1 capsule by mouth every morning. 30 capsule 5  . fexofenadine (ALLEGRA) 180 MG tablet Take 180 mg by mouth daily.     . furosemide (LASIX) 40 MG tablet Take 1 tablets (40 mg) by mouth daily 30 tablet 6  . ibuprofen (ADVIL,MOTRIN) 200 MG tablet Take 200 mg by mouth every 6 (six) hours as needed for pain.    . Omega-3 Fatty Acids (FISH OIL) 600 MG CAPS Take 600 mg by mouth daily.     . tamsulosin (FLOMAX) 0.4 MG CAPS capsule Take 0.4 mg by mouth daily.  11  . XARELTO  20 MG TABS tablet TAKE 1 TABLET(20 MG) BY MOUTH DAILY WITH SUPPER 30 tablet 2   No current facility-administered medications for this visit.     Past Medical History  Diagnosis Date  . Benign prostatic hypertrophy     hx of  . Diverticulosis of colon (without mention of hemorrhage)   . Other thalassemia ([place])   . Gout   . Anemia   . GERD (gastroesophageal reflux disease)   . Colon polyp 2006, 2010    TUBULAR ADENOMA  . Presence of permanent cardiac pacemaker   . History of blood transfusion 12/1953; 03/1954    Transfused 7 pints blood '55, 4 pints '56  . MVP (mitral valve prolapse)   . First degree atrioventricular block   . Pneumonia "a number of times"  . OSA (obstructive sleep apnea)     "stopped wearing mask after I lost 100#"  . History of duodenal ulcer 1955    w/hemorrhage  . Osteoarthritis   . Arthritis     "middle finger both hands" (09/05/2014)  . Basal cell carcinoma   . Prostate cancer ([place])   . Kidney stones     "passed them"  . Morbid obesity ([place])     "  I was 89; lost 100# 2014-2015"    ROS:   All systems reviewed and negative except as noted in the HPI.   Past Surgical History  Procedure Laterality Date  . Tumor removal Left 2002    from finger- left index  . Mole removal Left 2008    "forearm"  . Cataract extraction w/ intraocular lens  implant, bilateral  2002-2004  . Gastrectomy  03/1954    Transfused 7 pints blood '55, 4 pints '56  . Colonoscopy  2015    multiple   . Esophagogastroduodenoscopy  2011    multiple  . Ep implantable device N/A 09/05/2014    Procedure: Pacemaker Implant;  Surgeon: [name], MD;  Location: [place] [facility];  Service: Cardiovascular;  Laterality: N/A;  . Insert / replace / remove pacemaker  09/05/2014  . Closed reduction ankle dislocation Right 1980's    "toe my achilles"     Family History  Problem Relation Age of Onset  . Coronary artery disease Father   . Heart attack Father   . Gout Father   .  Cancer Daughter     skin  . Colon cancer Neg Hx      Social History   Social History  . Marital Status: Married    Spouse Name: ANn  . Number of Children: 8  . Years of Education: 18+   Occupational History  . executive     retired   Social History Main Topics  . Smoking status: Former Smoker -- 3.00 packs/day for 20 years    Types: Cigarettes    Start date: 01/21/1970  . Smokeless tobacco: Never Used     Comment: quite smoking cigarettes in 1971  . Alcohol Use: Yes     Comment: "no alcohol since 1987"  . Drug Use: No  . Sexual Activity:    Partners: Female   Other Topics Concern  . Not on file   Social History Narrative   [employer] of [place]; [place]. Married '59. Eight Daughters '60, '62, '66, '70, '73, '74, '78, '83; 19 grandchildren. Lives independently with wife. ACP- asked pt to think about these issues (DEc '12)           BP 138/68 mmHg  Pulse 61  Ht 5\' 7"  (1.702 m)  Wt 220 lb (99.791 kg)  BMI 34.45 kg/m2  SpO2 96%  Physical Exam:  Well appearing 82 yo man, NAD HEENT: Unremarkable Neck:  7 cm JVD, no thyromegally Back:  No CVA tenderness Lungs:  Clear with no wheezes HEART:  Regular rate rhythm, no murmurs, no rubs, no clicks Abd:  soft, positive bowel sounds, no organomegally, no rebound, no guarding Ext:  2 plus pulses, no edema, no cyanosis, no clubbing Skin:  No rashes no nodules Neuro:  CN II through XII intact, motor grossly intact  DEVICE  Normal device function.  See PaceArt for details.   A/P 1. Atrial fib - he is maintaining NSR over 99% of the time. He will continue his current meds. 2. HTN - his blood pressure has been well controlled. He is encouraged to lose weight and reduce his salt intake. 3. PPM - his Medtronic DDD PM is working normally. He has been reprogrammed to DDDR 60/min.  Gregg Taylor,M.D.  Assess/Plan:

## 2015-09-10 LAB — CUP PACEART INCLINIC DEVICE CHECK
Battery Remaining Longevity: 93 mo
Brady Statistic AS VP Percent: 24.08 %
Brady Statistic AS VS Percent: 1.25 %
Brady Statistic RA Percent Paced: 74.68 %
Date Time Interrogation Session: 20170626155430
Implantable Lead Location: 753860
Implantable Lead Model: 5076
Implantable Lead Model: 5076
Lead Channel Impedance Value: 304 Ohm
Lead Channel Pacing Threshold Pulse Width: 0.4 ms
Lead Channel Pacing Threshold Pulse Width: 0.4 ms
Lead Channel Sensing Intrinsic Amplitude: 15 mV
Lead Channel Sensing Intrinsic Amplitude: 5.375 mV
Lead Channel Sensing Intrinsic Amplitude: 6 mV
Lead Channel Setting Sensing Sensitivity: 4 mV
MDC IDC LEAD IMPLANT DT: 20160622
MDC IDC LEAD IMPLANT DT: 20160622
MDC IDC LEAD LOCATION: 753859
MDC IDC MSMT BATTERY VOLTAGE: 3.01 V
MDC IDC MSMT LEADCHNL RA IMPEDANCE VALUE: 437 Ohm
MDC IDC MSMT LEADCHNL RA PACING THRESHOLD AMPLITUDE: 1 V
MDC IDC MSMT LEADCHNL RV IMPEDANCE VALUE: 380 Ohm
MDC IDC MSMT LEADCHNL RV IMPEDANCE VALUE: 456 Ohm
MDC IDC MSMT LEADCHNL RV PACING THRESHOLD AMPLITUDE: 0.5 V
MDC IDC MSMT LEADCHNL RV SENSING INTR AMPL: 13.25 mV
MDC IDC SET LEADCHNL RA PACING AMPLITUDE: 2 V
MDC IDC SET LEADCHNL RV PACING AMPLITUDE: 2.5 V
MDC IDC SET LEADCHNL RV PACING PULSEWIDTH: 0.4 ms
MDC IDC STAT BRADY AP VP PERCENT: 72.58 %
MDC IDC STAT BRADY AP VS PERCENT: 2.1 %
MDC IDC STAT BRADY RV PERCENT PACED: 96.66 %

## 2015-09-20 ENCOUNTER — Other Ambulatory Visit: Payer: Self-pay | Admitting: *Deleted

## 2015-09-20 DIAGNOSIS — D568 Other thalassemias: Secondary | ICD-10-CM

## 2015-09-20 MED ORDER — INTEGRA PLUS PO CAPS: 1.0000 | ORAL_CAPSULE | Freq: Every morning | ORAL | Status: DC

## 2015-09-20 NOTE — Telephone Encounter (Signed)
Integra Plus refill sent to pt pharmacy via fax and e-scribe.

## 2015-09-24 ENCOUNTER — Ambulatory Visit: Payer: Medicare Other | Admitting: Physical Therapy

## 2015-09-25 ENCOUNTER — Encounter: Payer: Self-pay | Admitting: Physical Therapy

## 2015-09-25 ENCOUNTER — Ambulatory Visit: Payer: Medicare Other | Attending: Internal Medicine | Admitting: Physical Therapy

## 2015-09-25 DIAGNOSIS — M25612 Stiffness of left shoulder, not elsewhere classified: Secondary | ICD-10-CM | POA: Insufficient documentation

## 2015-09-25 DIAGNOSIS — R262 Difficulty in walking, not elsewhere classified: Secondary | ICD-10-CM | POA: Diagnosis present

## 2015-09-25 DIAGNOSIS — M25512 Pain in left shoulder: Secondary | ICD-10-CM | POA: Diagnosis present

## 2015-09-25 NOTE — Therapy (Signed)
[place], [place], [address] Phone: [phone]   Fax:  [phone]  Physical Therapy Evaluation  Patient Details  Name: Jacob [name] MRN: [id number] Date of Birth: Mar 28, 1933 Referring Provider: Osborne Casco  Encounter Date: 09/25/2015      PT End of Session - 09/25/15 1438    Visit Number 1   Date for PT Re-Evaluation 11/26/15   PT Start Time 1400   PT Stop Time 1440   PT Time Calculation (min) 40 min   Activity Tolerance Patient tolerated treatment well   Behavior During Therapy [facility] for tasks assessed/performed      Past Medical History  Diagnosis Date  . Benign prostatic hypertrophy     hx of  . Diverticulosis of colon (without mention of hemorrhage)   . Other thalassemia ([place])   . Gout   . Anemia   . GERD (gastroesophageal reflux disease)   . Colon polyp 2006, 2010    TUBULAR ADENOMA  . Presence of permanent cardiac pacemaker   . History of blood transfusion 12/1953; 03/1954    Transfused 7 pints blood '55, 4 pints '56  . MVP (mitral valve prolapse)   . First degree atrioventricular block   . Pneumonia "a number of times"  . OSA (obstructive sleep apnea)     "stopped wearing mask after I lost 100#"  . History of duodenal ulcer 1955    w/hemorrhage  . Osteoarthritis   . Arthritis     "middle finger both hands" (09/05/2014)  . Basal cell carcinoma   . Prostate cancer ([place])   . Kidney stones     "passed them"  . Morbid obesity ([place])     "I was 294; lost 100# 2014-2015"    Past Surgical History  Procedure Laterality Date  . Tumor removal Left 2002    from finger- left index  . Mole removal Left 2008    "forearm"  . Cataract extraction w/ intraocular lens  implant, bilateral  2002-2004  . Gastrectomy  03/1954    Transfused 7 pints blood '55, 4 pints '56  . Colonoscopy  2015    multiple   . Esophagogastroduodenoscopy  2011    multiple  . Ep implantable device N/A  09/05/2014    Procedure: Pacemaker Implant;  Surgeon: [name], MD;  Location: [place] [facility];  Service: Cardiovascular;  Laterality: N/A;  . Insert / replace / remove pacemaker  09/05/2014  . Closed reduction ankle dislocation Right 1980's    "toe my achilles"    There were no vitals filed for this visit.       Subjective Assessment - 09/25/15 1406    Subjective Patient reports that he has been having left shoulder pain and decreased ROM over the past 6 months.  He did have a pacemaker inserted in June 2016.   Limitations Lifting;House hold activities   Patient Stated Goals have better ROM and less pain   Currently in Pain? Yes   Pain Score 0-No pain   Pain Location Shoulder   Pain Orientation Left   Pain Descriptors / Indicators Aching   Pain Type Chronic pain   Pain Onset More than a month ago   Pain Frequency Intermittent   Aggravating Factors  reaching out and up, pain is up to 6/10, unable to sleep on the left side due to pain an 8/10   Pain Relieving Factors not moving he will have no pain  Effect of Pain on Daily Activities difficulty with sleep and ADL's            [facility] PT Assessment - 09/25/15 0001    Assessment   Medical Diagnosis left shoulder pain   Referring Provider [name]   Onset Date/Surgical Date 08/26/15   Hand Dominance Right   Prior Therapy about 2 years ago   Precautions   Precautions ICD/Pacemaker   Precaution Comments no estim   Balance Screen   Has the patient fallen in the past 6 months No   Has the patient had a decrease in activity level because of a fear of falling?  No   Is the patient reluctant to leave their home because of a fear of falling?  No   Home Environment   Additional Comments does his own yardwork and housework, he does care for his wife who is not in great health   Prior Function   Level of Independence Independent   Vocation Retired   Leisure walks 5x/week   Posture/Postural Control   Posture Comments fwd  head, rounded shoulders   ROM / Strength   AROM / PROM / Strength Strength;AROM;PROM   AROM   Overall AROM Comments has a painful arc from 80-100 degrees of motions   AROM Assessment Site Shoulder   Right/Left Shoulder Left   Left Shoulder Flexion 120 Degrees   Left Shoulder ABduction 100 Degrees   Left Shoulder Internal Rotation 40 Degrees   Left Shoulder External Rotation 40 Degrees   PROM   PROM Assessment Site Shoulder   Right/Left Shoulder Left   Left Shoulder Flexion 135 Degrees   Left Shoulder ABduction 120 Degrees   Left Shoulder Internal Rotation 50 Degrees   Left Shoulder External Rotation 60 Degrees   Strength   Overall Strength Comments 4/5 for ER and IR, 4-/5 for flexion and abduction with some pain   Palpation   Palpation comment non tender, some atrophy above the scapular ridge   Special Tests    Special Tests --  some pain with impingement test, weak empty can test on the    Standardized Balance Assessment   Standardized Balance Assessment Berg Balance Test   Berg Balance Test   Sit to Stand Able to stand without using hands and stabilize independently   Standing Unsupported Able to stand safely 2 minutes   Sitting with Back Unsupported but Feet Supported on Floor or Stool Able to sit safely and securely 2 minutes   Stand to Sit Sits safely with minimal use of hands   Transfers Able to transfer safely, minor use of hands   Standing Unsupported with Eyes Closed Able to stand 10 seconds with supervision   Standing Ubsupported with Feet Together Able to place feet together independently and stand for 1 minute with supervision   From Standing, Reach Forward with Outstretched Arm Can reach confidently >25 cm (10")   From Standing Position, Pick up Object from Floor Able to pick up shoe safely and easily   From Standing Position, Turn to Look Behind Over each Shoulder Looks behind from both sides and weight shifts well   Turn 360 Degrees Able to turn 360 degrees safely  in 4 seconds or less   Standing Unsupported, Alternately Place Feet on Step/Stool Able to stand independently and safely and complete 8 steps in 20 seconds   Standing Unsupported, One Foot in Front Able to take small step independently and hold 30 seconds   Standing on One Leg Tries to  lift leg/unable to hold 3 seconds but remains standing independently   Total Score 49                           PT Education - 09/25/15 1437    Education provided Yes   Education Details balance activities, scapular stabilization   Person(s) Educated Patient   Methods Explanation;Demonstration;Handout   Comprehension Verbalized understanding          PT Short Term Goals - 09/25/15 1446    PT SHORT TERM GOAL #1   Title independent with initial HEP   Time 2   Period Weeks   Status New           PT Long Term Goals - 09/25/15 1447    PT LONG TERM GOAL #1   Title understand posture and body mechanics   Time 8   Period Weeks   Status New   PT LONG TERM GOAL #2   Title decrease pain 25%   Time 8   Period Weeks   Status New   PT LONG TERM GOAL #3   Title increase AROM of the left shoulder to 65 degrees ER and IR   Time 8   Period Weeks   Status New   PT LONG TERM GOAL #4   Title be able to stand on one leg 10 seconds   Time 8   Period Weeks   Status New               Plan - 09/25/15 1438    Clinical Impression Statement Patient reports increased left shoulder pain and decreased ROM and difficulty with ADL's over the past 6 months, no known cause but reports he prefers to sleep on that side, he is right handed, has some limitation with ROM but biggest issue is a painful arc of motion, he also c/o not being able to put on pants by standing on one leg and also issues of loss of balance when closing his eyes in the shower.  He had difficulty with single leg stance and with eyes closed and on foam   Rehab Potential Good   PT Frequency 1x / week   PT Duration 8  weeks   PT Treatment/Interventions ADLs/Self Care Home Management;Cryotherapy;Moist Heat;Therapeutic exercise;Therapeutic activities;Ultrasound;Patient/family education;Neuromuscular re-education;Balance training;Manual techniques   PT Next Visit Plan add RC stabilization exercises   Consulted and Agree with Plan of Care Patient      Patient will benefit from skilled therapeutic intervention in order to improve the following deficits and impairments:  Decreased range of motion, Difficulty walking, Decreased strength, Impaired flexibility, Postural dysfunction, Improper body mechanics, Pain, Decreased balance  Visit Diagnosis: Pain in left shoulder - Plan: PT plan of care cert/re-cert  Stiffness of left shoulder, not elsewhere classified - Plan: PT plan of care cert/re-cert  Difficulty in walking, not elsewhere classified - Plan: PT plan of care cert/re-cert      G-Codes - [id number]    Functional Assessment Tool Used foto 53%  limitation   Functional Limitation Carrying, moving and handling objects   Carrying, Moving and Handling Objects Current Status [id number]) At least 40 percent but less than 60 percent impaired, limited or restricted   Carrying, Moving and Handling Objects Goal Status [id number]) At least 20 percent but less than 40 percent impaired, limited or restricted       Problem List Patient Active Problem List   Diagnosis Date Noted  . Atrial  fibrillation ([place]) 09/13/2014  . Pacemaker 09/05/2014  . Complete heart block ([place]) 08/07/2014  . Unspecified constipation 09/05/2013  . Iron deficiency anemia 02/28/2013  . Need for prophylactic vaccination and inoculation against influenza 12/31/2011  . Routine health maintenance 01/20/2011  . FLATULENCE-GAS-BLOATING 10/14/2009  . GERD 09/18/2009  . PERSONAL HX COLONIC POLYPS 09/18/2009  . PALPITATIONS 11/20/2008  . MURMUR 11/20/2008  . LABRYNTHITIS 09/26/2008  . HEMATURIA UNSPECIFIED 07/04/2008  . DIVERTICULITIS, HX OF  07/04/2008  . COUGH 05/24/2008  . Gout, unspecified 08/29/2007  . Essential hypertension 08/29/2007  . CHRONIC RHINITIS 08/29/2007  . BENIGN PROSTATIC HYPERTROPHY 08/29/2007  . OSTEOARTHRITIS 08/29/2007  . INTERMITTENT VERTIGO 08/29/2007  . DYSLIPIDEMIA 12/08/2006  . THALASSEMIA NEC 12/08/2006  . Mitral valve disorder 12/08/2006  . BLOCK, AV, 1ST DEGREE 12/08/2006  . PEPTIC ULCER DISEASE 12/08/2006  . DIVERTICULOSIS, COLON 12/08/2006  . SLEEP APNEA 12/08/2006  . CATARACT, HX OF 12/08/2006  . NEPHROLITHIASIS, HX OF 12/08/2006  . MORBID OBESITY, HX OF 12/08/2006  . GASTRECTOMY, HX OF 12/08/2006  . CATARACT EXTRACTION, HX OF 12/08/2006    Sumner Boast., PT 09/25/2015, 4:00 PM  [address] [place], [place], [address] Phone: [phone]   Fax:  [phone]  Name: Jacob [name] MRN: [id number] Date of Birth: 1933/10/07

## 2015-10-02 ENCOUNTER — Encounter: Payer: Self-pay | Admitting: Physical Therapy

## 2015-10-02 ENCOUNTER — Ambulatory Visit: Payer: Medicare Other | Admitting: Physical Therapy

## 2015-10-02 DIAGNOSIS — M25612 Stiffness of left shoulder, not elsewhere classified: Secondary | ICD-10-CM

## 2015-10-02 DIAGNOSIS — M25512 Pain in left shoulder: Secondary | ICD-10-CM | POA: Diagnosis not present

## 2015-10-02 DIAGNOSIS — R262 Difficulty in walking, not elsewhere classified: Secondary | ICD-10-CM

## 2015-10-02 NOTE — Therapy (Signed)
[place], [place], [address] Phone: [phone]   Fax:  [phone]  Physical Therapy Treatment  Patient Details  Name: Jacob [name] MRN: [id number] Date of Birth: [birth year] Referring Provider: Osborne Casco  Encounter Date: 10/02/2015      PT End of Session - 10/02/15 1541    Visit Number 2   Date for PT Re-Evaluation 11/26/15   PT Start Time 1443   PT Stop Time 1533   PT Time Calculation (min) 50 min   Activity Tolerance Patient tolerated treatment well   Behavior During Therapy [facility] for tasks assessed/performed      Past Medical History  Diagnosis Date  . Benign prostatic hypertrophy     hx of  . Diverticulosis of colon (without mention of hemorrhage)   . Other thalassemia ([place])   . Gout   . Anemia   . GERD (gastroesophageal reflux disease)   . Colon polyp 2006, 2010    TUBULAR ADENOMA  . Presence of permanent cardiac pacemaker   . History of blood transfusion 12/1953; 03/1954    Transfused 7 pints blood '55, 4 pints '56  . MVP (mitral valve prolapse)   . First degree atrioventricular block   . Pneumonia "a number of times"  . OSA (obstructive sleep apnea)     "stopped wearing mask after I lost 100#"  . History of duodenal ulcer 1955    w/hemorrhage  . Osteoarthritis   . Arthritis     "middle finger both hands" (09/05/2014)  . Basal cell carcinoma   . Prostate cancer ([place])   . Kidney stones     "passed them"  . Morbid obesity ([place])     "I was 89; lost 100# 2014-2015"    Past Surgical History  Procedure Laterality Date  . Tumor removal Left 2002    from finger- left index  . Mole removal Left 2008    "forearm"  . Cataract extraction w/ intraocular lens  implant, bilateral  2002-2004  . Gastrectomy  03/1954    Transfused 7 pints blood '55, 4 pints '56  . Colonoscopy  2015    multiple   . Esophagogastroduodenoscopy  2011    multiple  . Ep implantable device N/A  09/05/2014    Procedure: Pacemaker Implant;  Surgeon: [name], MD;  Location: [place] [facility];  Service: Cardiovascular;  Laterality: N/A;  . Insert / replace / remove pacemaker  09/05/2014  . Closed reduction ankle dislocation Right 1980's    "toe my achilles"    There were no vitals filed for this visit.      Subjective Assessment - 10/02/15 1534    Subjective Patient reports that he is doing okay with the shoulder exercises but is frustrated by the balance exercises because he cannot do them well   Currently in Pain? No/denies                         [facility] Adult PT Treatment/Exercise - 10/02/15 0001    High Level Balance   High Level Balance Activities Side stepping;Backward walking;Tandem walking   High Level Balance Comments toe walking, heel walking, balance exercises on foam and off, ball toss, eyes closed, head turns   Self-Care   Self-Care Other Self-Care Comments   Other Self-Care Comments  Patient needs a lot of cues and education on correct exercise form in the gym and appropriate weight, his normal is  to overachieve and to do high weight and poor form and reports that he feels that he should "hurt" when he exercises, because that is how he knows it is working.   Exercises   Exercises Shoulder   Shoulder Exercises: ROM/Strengthening   Cybex Row 20 reps   Cybex Row Limitations 45#   Other ROM/Strengthening Exercises lat pull down 2 different hand positions 2x10                PT Education - 10/02/15 1544    Education provided Yes   Education Details add RC stabilization   Person(s) Educated Patient   Methods Explanation;Demonstration;Verbal cues   Comprehension Verbal cues required;Returned demonstration;Verbalized understanding          PT Short Term Goals - 10/02/15 1544    PT SHORT TERM GOAL #1   Title independent with initial HEP   Status Achieved           PT Long Term Goals - 09/25/15 1447    PT LONG TERM GOAL #1    Title understand posture and body mechanics   Time 8   Period Weeks   Status New   PT LONG TERM GOAL #2   Title decrease pain 25%   Time 8   Period Weeks   Status New   PT LONG TERM GOAL #3   Title increase AROM of the left shoulder to 65 degrees ER and IR   Time 8   Period Weeks   Status New   PT LONG TERM GOAL #4   Title be able to stand on one leg 10 seconds   Time 8   Period Weeks   Status New               Plan - 10/02/15 1542    Clinical Impression Statement For his shoulder patient needs a lot of education as he is "old School" and feel that more weight is better and that he has to hurt for the exercise to work, I did a lot of education with him about these myths.  He does well with challenging his balance but this is frustrating to him as he feels that he should be normal and have no issues with the tasks.   PT Next Visit Plan he is to continue to try exercises and we will see in two weeks   Consulted and Agree with Plan of Care Patient      Patient will benefit from skilled therapeutic intervention in order to improve the following deficits and impairments:  Decreased range of motion, Difficulty walking, Decreased strength, Impaired flexibility, Postural dysfunction, Improper body mechanics, Pain, Decreased balance  Visit Diagnosis: Pain in left shoulder  Stiffness of left shoulder, not elsewhere classified  Difficulty in walking, not elsewhere classified     Problem List Patient Active Problem List   Diagnosis Date Noted  . Atrial fibrillation ([place]) 09/13/2014  . Pacemaker 09/05/2014  . Complete heart block ([place]) 08/07/2014  . Unspecified constipation 09/05/2013  . Iron deficiency anemia 02/28/2013  . Need for prophylactic vaccination and inoculation against influenza 12/31/2011  . Routine health maintenance 01/20/2011  . FLATULENCE-GAS-BLOATING 10/14/2009  . GERD 09/18/2009  . PERSONAL HX COLONIC POLYPS 09/18/2009  . PALPITATIONS 11/20/2008   . MURMUR 11/20/2008  . LABRYNTHITIS 09/26/2008  . HEMATURIA UNSPECIFIED 07/04/2008  . DIVERTICULITIS, HX OF 07/04/2008  . COUGH 05/24/2008  . Gout, unspecified 08/29/2007  . Essential hypertension 08/29/2007  . CHRONIC RHINITIS 08/29/2007  . BENIGN PROSTATIC  HYPERTROPHY 08/29/2007  . OSTEOARTHRITIS 08/29/2007  . INTERMITTENT VERTIGO 08/29/2007  . DYSLIPIDEMIA 12/08/2006  . THALASSEMIA NEC 12/08/2006  . Mitral valve disorder 12/08/2006  . BLOCK, AV, 1ST DEGREE 12/08/2006  . PEPTIC ULCER DISEASE 12/08/2006  . DIVERTICULOSIS, COLON 12/08/2006  . SLEEP APNEA 12/08/2006  . CATARACT, HX OF 12/08/2006  . NEPHROLITHIASIS, HX OF 12/08/2006  . MORBID OBESITY, HX OF 12/08/2006  . GASTRECTOMY, HX OF 12/08/2006  . CATARACT EXTRACTION, HX OF 12/08/2006    Sumner Boast., PT 10/02/2015, 3:49 PM  [place] [address] [place], [place], [address] Phone: [phone]   Fax:  [phone]  Name: DOSSIE HATCHER MRN: [id number] Date of Birth: 02-19-1934

## 2015-10-16 ENCOUNTER — Encounter: Payer: Self-pay | Admitting: Physical Therapy

## 2015-10-16 ENCOUNTER — Ambulatory Visit: Payer: Medicare Other | Attending: Internal Medicine | Admitting: Physical Therapy

## 2015-10-16 DIAGNOSIS — M25612 Stiffness of left shoulder, not elsewhere classified: Secondary | ICD-10-CM | POA: Insufficient documentation

## 2015-10-16 DIAGNOSIS — R262 Difficulty in walking, not elsewhere classified: Secondary | ICD-10-CM | POA: Diagnosis present

## 2015-10-16 DIAGNOSIS — M25512 Pain in left shoulder: Secondary | ICD-10-CM | POA: Insufficient documentation

## 2015-10-16 NOTE — Therapy (Addendum)
[address] [place], [place], [address] Phone: [phone]   Fax:  [phone]  Physical Therapy Treatment  Patient Details  Name: Jacob [name] MRN: [id number] Date of Birth: 12/06/1933 Referring Provider: Osborne Casco  Encounter Date: 10/16/2015      PT End of Session - 10/16/15 1527    Visit Number 3   Date for PT Re-Evaluation 11/26/15   PT Start Time 1442   PT Stop Time 1524   PT Time Calculation (min) 42 min   Activity Tolerance Patient tolerated treatment well   Behavior During Therapy [facility] for tasks assessed/performed      Past Medical History:  Diagnosis Date  . Anemia   . Arthritis    "middle finger both hands" (09/05/2014)  . Basal cell carcinoma   . Benign prostatic hypertrophy    hx of  . Colon polyp 2006, 2010   TUBULAR ADENOMA  . Diverticulosis of colon (without mention of hemorrhage)   . First degree atrioventricular block   . GERD (gastroesophageal reflux disease)   . Gout   . History of blood transfusion 12/1953; 03/1954   Transfused 7 pints blood '55, 4 pints '56  . History of duodenal ulcer 1955   w/hemorrhage  . Kidney stones    "passed them"  . Morbid obesity ([place])    "I was 294; lost 100# 2014-2015"  . MVP (mitral valve prolapse)   . OSA (obstructive sleep apnea)    "stopped wearing mask after I lost 100#"  . Osteoarthritis   . Other thalassemia ([place])   . Pneumonia "a number of times"  . Presence of permanent cardiac pacemaker   . Prostate cancer [facility])     Past Surgical History:  Procedure Laterality Date  . CATARACT EXTRACTION W/ INTRAOCULAR LENS  IMPLANT, BILATERAL  2002-2004  . CLOSED REDUCTION ANKLE DISLOCATION Right 1980's   "toe my achilles"  . COLONOSCOPY  2015   multiple   . EP IMPLANTABLE DEVICE N/A 09/05/2014   Procedure: Pacemaker Implant;  Surgeon: [name], MD;  Location: [facility];  Service: Cardiovascular;  Laterality: N/A;  .  ESOPHAGOGASTRODUODENOSCOPY  2011   multiple  . GASTRECTOMY  03/1954   Transfused 7 pints blood '55, 4 pints '56  . INSERT / REPLACE / REMOVE PACEMAKER  09/05/2014  . MOLE REMOVAL Left 2008   "forearm"  . TUMOR REMOVAL Left 2002   from finger- left index    There were no vitals filed for this visit.      Subjective Assessment - 10/16/15 1444    Subjective Doing well with balance, no issues with the new shoulder exercises, he does report difficulty reaching above his head   Currently in Pain? No/denies            [facility] PT Assessment - 10/16/15 0001      AROM   Left Shoulder Flexion 140 Degrees   Left Shoulder ABduction 140 Degrees   Left Shoulder Internal Rotation 52 Degrees   Left Shoulder External Rotation 68 Degrees                     [facility] Adult PT Treatment/Exercise - 10/16/15 0001      Self-Care   Self-Care Other Self-Care Comments   Other Self-Care Comments  went over HEP for balance, then went over isometric shoulder exercises with 1#-4# as well as with body blade, went over safety with weights and for him  to avoid heavy lifting as well asn avoiding a lot of pushing exercises, PT demonstrated and had him perform with some cues needed                  PT Short Term Goals - 10/02/15 1544      PT SHORT TERM GOAL #1   Title independent with initial HEP   Status Achieved           PT Long Term Goals - 10/16/15 1529      PT LONG TERM GOAL #1   Title understand posture and body mechanics   Status Achieved     PT LONG TERM GOAL #2   Title decrease pain 25%   Status Achieved     PT LONG TERM GOAL #3   Title increase AROM of the left shoulder to 65 degrees ER and IR   Status Achieved     PT LONG TERM GOAL #4   Title be able to stand on one leg 10 seconds   Status Partially Met               Plan - 10/16/15 1527    Clinical Impression Statement We did a lot of education today about his need to use small weights as well  as to use good form and do isometrics, 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   PT Next Visit Plan place on hold   Consulted and Agree with Plan of Care Patient      Patient will benefit from skilled therapeutic intervention in order to improve the following deficits and impairments:  Decreased range of motion, Difficulty walking, Decreased strength, Impaired flexibility, Postural dysfunction, Improper body mechanics, Pain, Decreased balance  Visit Diagnosis: Pain in left shoulder  Stiffness of left shoulder, not elsewhere classified  Difficulty in walking, not elsewhere classified     Problem List Patient Active Problem List   Diagnosis Date Noted  . Atrial fibrillation ([place]) 09/13/2014  . Pacemaker 09/05/2014  . Complete heart block ([place]) 08/07/2014  . Unspecified constipation 09/05/2013  . Iron deficiency anemia 02/28/2013  . Need for prophylactic vaccination and inoculation against influenza 12/31/2011  . Routine health maintenance 01/20/2011  . FLATULENCE-GAS-BLOATING 10/14/2009  . GERD 09/18/2009  . PERSONAL HX COLONIC POLYPS 09/18/2009  . PALPITATIONS 11/20/2008  . MURMUR 11/20/2008  . LABRYNTHITIS 09/26/2008  . HEMATURIA UNSPECIFIED 07/04/2008  . DIVERTICULITIS, HX OF 07/04/2008  . COUGH 05/24/2008  . Gout, unspecified 08/29/2007  . Essential hypertension 08/29/2007  . CHRONIC RHINITIS 08/29/2007  . BENIGN PROSTATIC HYPERTROPHY 08/29/2007  . OSTEOARTHRITIS 08/29/2007  . INTERMITTENT VERTIGO 08/29/2007  . DYSLIPIDEMIA 12/08/2006  . THALASSEMIA NEC 12/08/2006  . Mitral valve disorder 12/08/2006  . BLOCK, AV, 1ST DEGREE 12/08/2006  . PEPTIC ULCER DISEASE 12/08/2006  . DIVERTICULOSIS, COLON 12/08/2006  . SLEEP APNEA 12/08/2006  . CATARACT, HX OF 12/08/2006  . NEPHROLITHIASIS, HX OF 12/08/2006  . MORBID OBESITY, HX OF 12/08/2006  . GASTRECTOMY, HX OF 12/08/2006  . CATARACT EXTRACTION, HX OF 12/08/2006   PHYSICAL THERAPY DISCHARGE  SUMMARY   Plan: Patient agrees to discharge.  Patient goals were met. Patient is being discharged due to meeting the stated rehab goals.  ?????        Sumner Boast., PT 10/16/2015, 3:30 PM  [address] [place], [place], [address] Phone: [phone]   Fax:  [phone]  Name: Jacob [name] MRN:  [id number] Date of Birth: [birth year]

## 2015-11-30 ENCOUNTER — Other Ambulatory Visit: Payer: Self-pay | Admitting: Internal Medicine

## 2015-12-09 ENCOUNTER — Ambulatory Visit (INDEPENDENT_AMBULATORY_CARE_PROVIDER_SITE_OTHER): Payer: Medicare Other | Admitting: *Deleted

## 2015-12-09 DIAGNOSIS — I442 Atrioventricular block, complete: Secondary | ICD-10-CM

## 2015-12-09 NOTE — Progress Notes (Signed)
Remote pacemaker transmission.   

## 2015-12-11 ENCOUNTER — Encounter: Payer: Self-pay | Admitting: Cardiology

## 2016-01-02 LAB — CUP PACEART REMOTE DEVICE CHECK
Battery Remaining Longevity: 91 mo
Battery Voltage: 3.01 V
Brady Statistic AP VS Percent: 0.83 %
Brady Statistic AS VS Percent: 1.36 %
Implantable Lead Implant Date: 20160622
Implantable Lead Implant Date: 20160622
Implantable Lead Location: 753860
Implantable Lead Model: 5076
Lead Channel Impedance Value: 342 Ohm
Lead Channel Impedance Value: 399 Ohm
Lead Channel Impedance Value: 399 Ohm
Lead Channel Pacing Threshold Amplitude: 0.625 V
Lead Channel Pacing Threshold Amplitude: 0.625 V
Lead Channel Sensing Intrinsic Amplitude: 15.375 mV
Lead Channel Sensing Intrinsic Amplitude: 5.25 mV
Lead Channel Sensing Intrinsic Amplitude: 5.25 mV
Lead Channel Setting Pacing Amplitude: 2 V
Lead Channel Setting Pacing Amplitude: 2.5 V
Lead Channel Setting Sensing Sensitivity: 4 mV
MDC IDC LEAD LOCATION: 753859
MDC IDC MSMT LEADCHNL RA IMPEDANCE VALUE: 304 Ohm
MDC IDC MSMT LEADCHNL RA PACING THRESHOLD PULSEWIDTH: 0.4 ms
MDC IDC MSMT LEADCHNL RV PACING THRESHOLD PULSEWIDTH: 0.4 ms
MDC IDC MSMT LEADCHNL RV SENSING INTR AMPL: 15.375 mV
MDC IDC SESS DTM: 20170925142512
MDC IDC SET LEADCHNL RV PACING PULSEWIDTH: 0.4 ms
MDC IDC STAT BRADY AP VP PERCENT: 81.44 %
MDC IDC STAT BRADY AS VP PERCENT: 16.37 %
MDC IDC STAT BRADY RA PERCENT PACED: 82.27 %
MDC IDC STAT BRADY RV PERCENT PACED: 97.81 %

## 2016-01-06 ENCOUNTER — Ambulatory Visit (INDEPENDENT_AMBULATORY_CARE_PROVIDER_SITE_OTHER): Payer: Medicare Other | Admitting: Physician Assistant

## 2016-01-06 VITALS — BP 122/60 | HR 64 | Temp 98.0°F | Resp 16 | Ht 67.0 in | Wt 231.0 lb

## 2016-01-06 DIAGNOSIS — L03114 Cellulitis of left upper limb: Secondary | ICD-10-CM

## 2016-01-06 MED ORDER — MUPIROCIN 2 % EX OINT: 1.0000 "application " | TOPICAL_OINTMENT | Freq: Three times a day (TID) | CUTANEOUS | 1 refills | Status: DC

## 2016-01-06 MED ORDER — CEPHALEXIN 500 MG PO CAPS
500.0000 mg | ORAL_CAPSULE | Freq: Four times a day (QID) | ORAL | 0 refills | Status: DC
Start: 2016-01-06 — End: 2016-01-30

## 2016-01-06 NOTE — Patient Instructions (Signed)
     IF you received an x-ray today, you will receive an invoice from [facility]. Please contact  Radiology at [phone] with questions or concerns regarding your invoice.   IF you received labwork today, you will receive an invoice from [facility]. Please contact [facility] at [phone] with questions or concerns regarding your invoice.   Our billing staff will not be able to assist you with questions regarding bills from these companies.  You will be contacted with the lab results as soon as they are available. The fastest way to get your results is to activate your My Chart account. Instructions are located on the last page of this paperwork. If you have not heard from us regarding the results in 2 weeks, please contact this office.      

## 2016-01-06 NOTE — Progress Notes (Signed)
By signing my name below, I, Raven Small, attest that this documentation has been prepared under the direction and in the presence of Philis Fendt, PA-C.  Electronically Signed: Thea Alken, ED Scribe. 01/06/2016. 1:51 PM.  01/06/2016 1:45 PM   DOB: 05/05/1933 / MRN: [id number]  SUBJECTIVE:  Jacob [name] is a 82 y.o. male presenting for wound . Notably he has a sulfa allergy, reports he gets a rash. He does not have a hx of diabetes per his chart.  Pt bumped left arm into an unknown object 4 days ago sustaining a skin tear. He notes some tenderness and occasional itching. He has been applying in OTC abx ointment and keeping wound covered with band-aids. States wound has not improved. He is on xeralto. He denies fever.    He is allergic to prednisone and sulfonamide derivatives.   He  has a past medical history of Anemia; Arthritis; Basal cell carcinoma; Benign prostatic hypertrophy; Colon polyp (2006, 2010); Diverticulosis of colon (without mention of hemorrhage); First degree atrioventricular block; GERD (gastroesophageal reflux disease); Gout; History of blood transfusion (12/1953; 03/1954); History of duodenal ulcer (1955); Kidney stones; Morbid obesity ([place]); MVP (mitral valve prolapse); OSA (obstructive sleep apnea); Osteoarthritis; Other thalassemia ([place]); Pneumonia ("a number of times"); Presence of permanent cardiac pacemaker; and Prostate cancer ([place]).    He  reports that he has quit smoking. His smoking use included Cigarettes. He started smoking about 45 years ago. He has a 60.00 pack-year smoking history. He has never used smokeless tobacco. He reports that he drinks alcohol. He reports that he does not use drugs. He  reports that he currently engages in sexual activity and has had male partners. The patient  has a past surgical history that includes Tumor removal (Left, 2002); Mole removal (Left, 2008); Cataract extraction w/ intraocular lens  implant, bilateral (2002-2004);  Gastrectomy (03/1954); Colonoscopy (2015); Esophagogastroduodenoscopy (2011); Cardiac catheterization (N/A, 09/05/2014); Insert / replace / remove pacemaker (09/05/2014); and Closed reduction ankle dislocation (Right, 1980's).  His family history includes Cancer in his daughter; Coronary artery disease in his father; Gout in his father; Heart attack in his father.  Review of Systems  Constitutional: Negative for fever.  Skin: Positive for itching.    The problem list and medications were reviewed and updated by myself where necessary and exist elsewhere in the encounter.   OBJECTIVE:  BP 122/60 (BP Location: Right Arm, Patient Position: Sitting, Cuff Size: Large)    Pulse 64    Temp 98 F (36.7 C) (Oral)    Resp 16    Ht 5\' 7"  (1.702 m)    Wt 231 lb (104.8 kg)    SpO2 97%    BMI 36.18 kg/m   Physical Exam  Constitutional: He is oriented to person, place, and time. Vital signs are normal. He appears well-developed.  Cardiovascular: Normal rate.   Pulmonary/Chest: Effort normal. No respiratory distress.  Musculoskeletal: Normal range of motion.  Neurological: He is alert and oriented to person, place, and time.  Skin: Skin is warm and dry.  "Rash" pictured below with tenderness and warmth. There is some weeping about the rash.   Psychiatric: He has a normal mood and affect. His behavior is normal. Judgment and thought content normal.    Lab Results  Component Value Date   CREATININE 0.96 08/27/2014    ASSESSMENT AND PLAN  Jacob [name] was seen today for wound infection.  Diagnoses and all orders for this visit:  Cellulitis of left upper extremity: He has  warmth and tenderness.  Minimal swelling at this time.  Will change ointment as this could be allergic to the OTC ointment.  Starting medium dose of keflex for 7 days. Cotton dressing and non adhesives supplied to patient and he was instructed on daily dressing changes.  -     cephALEXin (KEFLEX) 500 MG capsule; Take 1 capsule (500 mg  total) by mouth 4 (four) times daily. -     mupirocin ointment (BACTROBAN) 2 %; Apply 1 application topically 3 (three) times daily.      This note was scribed in my presence and I performed the services described in the this documentation.   The patient is advised to call or return to clinic if he does not see an improvement in symptoms, or to seek the care of the closest emergency department if he worsens with the above plan.   Philis Fendt, MHS, PA-C [facility] 01/06/2016 1:45 PM

## 2016-01-24 ENCOUNTER — Other Ambulatory Visit (HOSPITAL_BASED_OUTPATIENT_CLINIC_OR_DEPARTMENT_OTHER): Payer: Medicare Other

## 2016-01-24 DIAGNOSIS — D509 Iron deficiency anemia, unspecified: Secondary | ICD-10-CM | POA: Diagnosis not present

## 2016-01-24 DIAGNOSIS — D568 Other thalassemias: Secondary | ICD-10-CM

## 2016-01-24 LAB — CBC WITH DIFFERENTIAL/PLATELET
BASO%: 1 % (ref 0.0–2.0)
BASOS ABS: 0.1 10*3/uL (ref 0.0–0.1)
EOS%: 2.4 % (ref 0.0–7.0)
Eosinophils Absolute: 0.2 10*3/uL (ref 0.0–0.5)
HEMATOCRIT: 36.5 % — AB (ref 38.4–49.9)
HGB: 11.3 g/dL — ABNORMAL LOW (ref 13.0–17.1)
LYMPH#: 1.6 10*3/uL (ref 0.9–3.3)
LYMPH%: 22.3 % (ref 14.0–49.0)
MCH: 19.8 pg — AB (ref 27.2–33.4)
MCHC: 31 g/dL — AB (ref 32.0–36.0)
MCV: 64.1 fL — AB (ref 79.3–98.0)
MONO#: 0.8 10*3/uL (ref 0.1–0.9)
MONO%: 11.3 % (ref 0.0–14.0)
NEUT#: 4.4 10*3/uL (ref 1.5–6.5)
NEUT%: 63 % (ref 39.0–75.0)
Platelets: 162 10*3/uL (ref 140–400)
RBC: 5.69 10*6/uL (ref 4.20–5.82)
RDW: 16.6 % — ABNORMAL HIGH (ref 11.0–14.6)
WBC: 7.1 10*3/uL (ref 4.0–10.3)

## 2016-01-24 LAB — IRON AND TIBC
%SAT: 31 % (ref 20–55)
IRON: 127 ug/dL (ref 42–163)
TIBC: 408 ug/dL (ref 202–409)
UIBC: 280 ug/dL (ref 117–376)

## 2016-01-24 LAB — FERRITIN: Ferritin: 33 ng/ml (ref 22–316)

## 2016-01-30 ENCOUNTER — Telehealth: Payer: Self-pay | Admitting: Internal Medicine

## 2016-01-30 ENCOUNTER — Encounter: Payer: Self-pay | Admitting: Internal Medicine

## 2016-01-30 ENCOUNTER — Ambulatory Visit (HOSPITAL_BASED_OUTPATIENT_CLINIC_OR_DEPARTMENT_OTHER): Payer: Medicare Other | Admitting: Internal Medicine

## 2016-01-30 VITALS — BP 132/58 | HR 79 | Temp 98.2°F | Resp 18 | Ht 67.0 in | Wt 231.5 lb

## 2016-01-30 DIAGNOSIS — D509 Iron deficiency anemia, unspecified: Secondary | ICD-10-CM | POA: Diagnosis not present

## 2016-01-30 DIAGNOSIS — D561 Beta thalassemia: Secondary | ICD-10-CM

## 2016-01-30 NOTE — Telephone Encounter (Signed)
Gave patient avs report and appointments for May  °

## 2016-01-30 NOTE — Progress Notes (Signed)
[facility] [phone]   [phone]  OFFICE PROGRESS NOTE  [name], MD [place] [address]  DIAGNOSIS: History of beta thalassemia minor and iron deficiency anemia  PRIOR THERAPY: Status post Feraheme infusion 510 mg IV last dose was given on 03/01/2015.  CURRENT THERAPY: Integra plus 1 capsule by mouth daily.  INTERVAL HISTORY: Jacob [name] 82 y.o. male returns to the clinic today for 6 months followup visit. The patient is feeling fine today with no specific complaints. He denied having any significant fatigue or weakness. He exercises at regular basis at least one hour every day. He he is currently on treatment with Integra plus on daily basis and tolerating it well. He denied having any significant fever or chills, no nausea or vomiting. He has no chest pain, shortness of breath, cough or hemoptysis. He has no significant weight loss or night sweats. He had repeat CBC, iron study and ferritin performed recently and he is here for evaluation and discussion of his lab results.  MEDICAL HISTORY: Past Medical History:  Diagnosis Date  . Anemia   . Arthritis    "middle finger both hands" (09/05/2014)  . Basal cell carcinoma   . Benign prostatic hypertrophy    hx of  . Colon polyp 2006, 2010   TUBULAR ADENOMA  . Diverticulosis of colon (without mention of hemorrhage)   . First degree atrioventricular block   . GERD (gastroesophageal reflux disease)   . Gout   . History of blood transfusion 12/1953; 03/1954   Transfused 7 pints blood '55, 4 pints '56  . History of duodenal ulcer 1955   w/hemorrhage  . Kidney stones    "passed them"  . Morbid obesity ([place])    "I was 294; lost 100# 2014-2015"  . MVP (mitral valve prolapse)   . OSA (obstructive sleep apnea)    "stopped wearing mask after I lost 100#"  . Osteoarthritis   . Other thalassemia ([place])   . Pneumonia "a number of times"  . Presence of  permanent cardiac pacemaker   . Prostate cancer ([place])     ALLERGIES:  is allergic to prednisone and sulfonamide derivatives.  MEDICATIONS:  Current Outpatient Prescriptions  Medication Sig Dispense Refill  . acetaminophen (TYLENOL) 500 MG tablet Take 500 mg by mouth every 6 (six) hours as needed (pain).     Marland Kitchen allopurinol (ZYLOPRIM) 300 MG tablet Take 1 tablet (300 mg total) by mouth daily. 90 tablet 3  . Ascorbic Acid (VITAMIN C PO) Take 1 capsule by mouth daily as needed (supplement).    Marland Kitchen b complex vitamins capsule Take 1 capsule by mouth daily.    . Cholecalciferol (VITAMIN D3) 2000 UNITS capsule Take 1 capsule (2,000 Units total) by mouth daily.    . Coenzyme Q10 300 MG CAPS Take 300 mg by mouth daily.    . colchicine 0.6 MG tablet Take 0.6 mg by mouth daily as needed (gout).     . Cyanocobalamin (VITAMIN B 12 PO) Take 1,000 mg by mouth daily.     Marland Kitchen FeFum-FePoly-FA-B Cmp-C-Biot (INTEGRA PLUS) CAPS Take 1 capsule by mouth every morning. 90 capsule 1  . fexofenadine (ALLEGRA) 180 MG tablet Take 180 mg by mouth daily.     . furosemide (LASIX) 40 MG tablet Take 1 tablets (40 mg) by mouth daily 30 tablet 6  . ibuprofen (ADVIL,MOTRIN) 200 MG tablet Take 200 mg by mouth every 6 (six) hours as  needed for pain.    . mupirocin ointment (BACTROBAN) 2 % Apply 1 application topically 3 (three) times daily. 22 g 1  . Omega-3 Fatty Acids (FISH OIL) 600 MG CAPS Take 600 mg by mouth daily.     . tamsulosin (FLOMAX) 0.4 MG CAPS capsule Take 0.4 mg by mouth daily.  11  . XARELTO 20 MG TABS tablet TAKE 1 TABLET(20 MG) BY MOUTH DAILY WITH SUPPER 30 tablet 8   No current facility-administered medications for this visit.     SURGICAL HISTORY:  Past Surgical History:  Procedure Laterality Date  . CATARACT EXTRACTION W/ INTRAOCULAR LENS  IMPLANT, BILATERAL  2002-2004  . CLOSED REDUCTION ANKLE DISLOCATION Right 1980's   "toe my achilles"  . COLONOSCOPY  2015   multiple   . EP IMPLANTABLE DEVICE N/A  09/05/2014   Procedure: Pacemaker Implant;  Surgeon: [name], MD;  Location: [facility];  Service: Cardiovascular;  Laterality: N/A;  . ESOPHAGOGASTRODUODENOSCOPY  2011   multiple  . GASTRECTOMY  03/1954   Transfused 7 pints blood '55, 4 pints '56  . INSERT / REPLACE / REMOVE PACEMAKER  09/05/2014  . MOLE REMOVAL Left 2008   "forearm"  . TUMOR REMOVAL Left 2002   from finger- left index    REVIEW OF SYSTEMS:  A comprehensive review of systems was negative.   PHYSICAL EXAMINATION: General appearance: alert, cooperative and no distress Head: Normocephalic, without obvious abnormality, atraumatic Neck: no adenopathy, no JVD, supple, symmetrical, trachea midline and thyroid not enlarged, symmetric, no tenderness/mass/nodules Lymph nodes: Cervical, supraclavicular, and axillary nodes normal. Resp: clear to auscultation bilaterally Back: symmetric, no curvature. ROM normal. No CVA tenderness. Cardio: regular rate and rhythm, S1, S2 normal, no murmur, click, rub or gallop GI: soft, non-tender; bowel sounds normal; no masses,  no organomegaly Extremities: extremities normal, atraumatic, no cyanosis or edema  ECOG PERFORMANCE STATUS: 0 - Asymptomatic  There were no vitals taken for this visit.  LABORATORY DATA: Lab Results  Component Value Date   WBC 7.1 01/24/2016   HGB 11.3 (L) 01/24/2016   HCT 36.5 (L) 01/24/2016   MCV 64.1 (L) 01/24/2016   PLT 162 01/24/2016      Chemistry      Component Value Date/Time   NA 138 08/27/2014 0916   NA 141 12/12/2012 1522   K 4.6 08/27/2014 0916   K 4.2 12/12/2012 1522   CL 103 08/27/2014 0916   CO2 27 08/27/2014 0916   CO2 24 12/12/2012 1522   BUN 32 (H) 08/27/2014 0916   BUN 22.4 12/12/2012 1522   CREATININE 0.96 08/27/2014 0916   CREATININE 1.1 12/12/2012 1522      Component Value Date/Time   CALCIUM 9.6 08/27/2014 0916   CALCIUM 9.7 12/12/2012 1522   ALKPHOS 70 05/03/2013 1300   ALKPHOS 86 12/12/2012 1522   AST 26  05/03/2013 1300   AST 21 12/12/2012 1522   ALT 20 05/03/2013 1300   ALT 19 12/12/2012 1522   BILITOT 1.9 (H) 05/03/2013 1300   BILITOT 0.96 12/12/2012 1522     Other lab results: Ferritin 33, serum iron 127, total iron binding capacity 408 and iron saturation 31%.  RADIOGRAPHIC STUDIES: No results found.  ASSESSMENT AND PLAN: This is a very pleasant 82 years old white male with beta thalassemia minor as well as iron deficiency anemia. He has mild anemia consistent with his history of thalassemia minor. He is status post Feraheme infusion and currently on oral iron tablets with  Integra plus. His CBC today showed persistent mild anemia with stable hemoglobin of 11.3 but he also has normal iron study and ferritin. I recommended for the patient to continue his treatment with Integra plus 1 capsule by mouth daily. I would see him back for followup visit in 6 months with repeat CBC, iron study and ferritin. He was advised to call immediately if he has any concerning symptoms in the interval. The patient voices understanding of current disease status and treatment options and is in agreement with the current care plan.  All questions were answered. The patient knows to call the clinic with any problems, questions or concerns. We can certainly see the patient much sooner if necessary.  Disclaimer: This note was dictated with voice recognition software. Similar sounding words can inadvertently be transcribed and may not be corrected upon review.

## 2016-02-07 ENCOUNTER — Ambulatory Visit (INDEPENDENT_AMBULATORY_CARE_PROVIDER_SITE_OTHER): Payer: Medicare Other | Admitting: Family Medicine

## 2016-02-07 DIAGNOSIS — L03114 Cellulitis of left upper limb: Secondary | ICD-10-CM

## 2016-02-07 MED ORDER — CEPHALEXIN 500 MG PO CAPS: 500.0000 mg | ORAL_CAPSULE | Freq: Two times a day (BID) | ORAL | 0 refills | Status: DC

## 2016-02-07 MED ORDER — MUPIROCIN 2 % EX OINT: 1.0000 "application " | TOPICAL_OINTMENT | Freq: Three times a day (TID) | CUTANEOUS | 1 refills | Status: DC

## 2016-02-07 NOTE — Progress Notes (Signed)
Subjective:    Patient ID: Jacob [name], male    DOB: Dec 27, 1933, 82 y.o.   MRN: [id number]  HPI Jacob [name] is a 82 y.o. male  On blood thinner, followed by Dr. Percival Spanish.  Takes Xarelto for hx of afib, has pacemaker.   Had skin tear few weeks ago in L arm that wasn't healing. Treated with Bactoban and keflex. Healed 4- 5 days later.   Another skin tear about a week ago on R forearm/wrist area.  Treated with otc antibiotic ointment once per day and bandage.  Appeared to close 3-4 days ago, but then surrounding area started getting more red and a few bumps around skin, and spread of redness. Initially itched in arm. Less itching, but redness and bumps still there. Denies fever, no rash anywhere else.     Patient Active Problem List   Diagnosis Date Noted  . Atrial fibrillation ([place]) 09/13/2014  . Pacemaker 09/05/2014  . Complete heart block ([place]) 08/07/2014  . Unspecified constipation 09/05/2013  . Iron deficiency anemia 02/28/2013  . Need for prophylactic vaccination and inoculation against influenza 12/31/2011  . Routine health maintenance 01/20/2011  . FLATULENCE-GAS-BLOATING 10/14/2009  . GERD 09/18/2009  . PERSONAL HX COLONIC POLYPS 09/18/2009  . PALPITATIONS 11/20/2008  . MURMUR 11/20/2008  . LABRYNTHITIS 09/26/2008  . HEMATURIA UNSPECIFIED 07/04/2008  . DIVERTICULITIS, HX OF 07/04/2008  . COUGH 05/24/2008  . Gout, unspecified 08/29/2007  . Essential hypertension 08/29/2007  . CHRONIC RHINITIS 08/29/2007  . BENIGN PROSTATIC HYPERTROPHY 08/29/2007  . OSTEOARTHRITIS 08/29/2007  . INTERMITTENT VERTIGO 08/29/2007  . DYSLIPIDEMIA 12/08/2006  . THALASSEMIA NEC 12/08/2006  . Mitral valve disorder 12/08/2006  . BLOCK, AV, 1ST DEGREE 12/08/2006  . PEPTIC ULCER DISEASE 12/08/2006  . DIVERTICULOSIS, COLON 12/08/2006  . SLEEP APNEA 12/08/2006  . CATARACT, HX OF 12/08/2006  . NEPHROLITHIASIS, HX OF 12/08/2006  . MORBID OBESITY, HX OF 12/08/2006  .  GASTRECTOMY, HX OF 12/08/2006  . CATARACT EXTRACTION, HX OF 12/08/2006   Past Medical History:  Diagnosis Date  . Anemia   . Arthritis    "middle finger both hands" (09/05/2014)  . Basal cell carcinoma   . Benign prostatic hypertrophy    hx of  . Colon polyp 2006, 2010   TUBULAR ADENOMA  . Diverticulosis of colon (without mention of hemorrhage)   . First degree atrioventricular block   . GERD (gastroesophageal reflux disease)   . Gout   . History of blood transfusion 12/1953; 03/1954   Transfused 7 pints blood '55, 4 pints '56  . History of duodenal ulcer 1955   w/hemorrhage  . Kidney stones    "passed them"  . Morbid obesity ([place])    "I was 294; lost 100# 2014-2015"  . MVP (mitral valve prolapse)   . OSA (obstructive sleep apnea)    "stopped wearing mask after I lost 100#"  . Osteoarthritis   . Other thalassemia ([place])   . Pneumonia "a number of times"  . Presence of permanent cardiac pacemaker   . Prostate cancer [facility])    Past Surgical History:  Procedure Laterality Date  . CATARACT EXTRACTION W/ INTRAOCULAR LENS  IMPLANT, BILATERAL  2002-2004  . CLOSED REDUCTION ANKLE DISLOCATION Right 1980's   "toe my achilles"  . COLONOSCOPY  2015   multiple   . EP IMPLANTABLE DEVICE N/A 09/05/2014   Procedure: Pacemaker Implant;  Surgeon: [name], MD;  Location: [place] [facility];  Service: Cardiovascular;  Laterality: N/A;  . ESOPHAGOGASTRODUODENOSCOPY  2011   multiple  . GASTRECTOMY  03/1954   Transfused 7 pints blood '55, 4 pints '56  . INSERT / REPLACE / REMOVE PACEMAKER  09/05/2014  . MOLE REMOVAL Left 2008   "forearm"  . TUMOR REMOVAL Left 2002   from finger- left index   Allergies  Allergen Reactions  . Prednisone     REACTION: GI bleed  . Sulfonamide Derivatives     REACTION: Rash   Prior to Admission medications   Medication Sig Start Date End Date Taking? Authorizing Provider  acetaminophen (TYLENOL) 500 MG tablet Take 500 mg by mouth every 6 (six) hours  as needed (pain).    Yes Historical Provider, MD  allopurinol (ZYLOPRIM) 300 MG tablet Take 1 tablet (300 mg total) by mouth daily. 05/04/13  Yes Neena Rhymes, MD  Ascorbic Acid (VITAMIN C PO) Take 1 capsule by mouth daily as needed (supplement).   Yes Historical Provider, MD  b complex vitamins capsule Take 1 capsule by mouth daily.   Yes Historical Provider, MD  Cholecalciferol (VITAMIN D3) 2000 UNITS capsule Take 1 capsule (2,000 Units total) by mouth daily. 11/02/12  Yes Neena Rhymes, MD  Coenzyme Q10 300 MG CAPS Take 300 mg by mouth daily.   Yes Historical Provider, MD  colchicine 0.6 MG tablet Take 0.6 mg by mouth daily as needed (gout).  05/08/13  Yes Neena Rhymes, MD  Cyanocobalamin (VITAMIN B 12 PO) Take 1,000 mg by mouth daily.    Yes Historical Provider, MD  FeFum-FePoly-FA-B Cmp-C-Biot (INTEGRA PLUS) CAPS Take 1 capsule by mouth every morning. 09/20/15  Yes Curt Bears, MD  fexofenadine (ALLEGRA) 180 MG tablet Take 180 mg by mouth daily.    Yes Historical Provider, MD  furosemide (LASIX) 40 MG tablet Take 1 tablets (40 mg) by mouth daily 07/30/15  Yes [name], MD  ibuprofen (ADVIL,MOTRIN) 200 MG tablet Take 200 mg by mouth every 6 (six) hours as needed for pain.   Yes Historical Provider, MD  mupirocin ointment (BACTROBAN) 2 % Apply 1 application topically 3 (three) times daily. 01/06/16  Yes Tereasa Coop, PA-C  Omega-3 Fatty Acids (FISH OIL) 600 MG CAPS Take 600 mg by mouth daily.    Yes Historical Provider, MD  tamsulosin (FLOMAX) 0.4 MG CAPS capsule Take 0.4 mg by mouth daily. 11/13/13  Yes Historical Provider, MD  XARELTO 20 MG TABS tablet TAKE 1 TABLET(20 MG) BY MOUTH DAILY WITH SUPPER 12/02/15  Yes [name], MD   Social History   Social History  . Marital status: Married    Spouse name: ANn  . Number of children: 8  . Years of education: 18+   Occupational History  . executive     retired   Social History Main Topics  . Smoking status: Former  Smoker    Packs/day: 3.00    Years: 20.00    Types: Cigarettes    Start date: 01/21/1970  . Smokeless tobacco: Never Used     Comment: quite smoking cigarettes in 1971  . Alcohol use Yes     Comment: "no alcohol since 1987"  . Drug use: No  . Sexual activity: Yes    Partners: Female   Other Topics Concern  . 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 Eight Daughters '60, '62, '66, '70, '73, '74, '78, '83; 19 grandchildren. Lives independently with wife. ACP- asked pt to think about these issues (  DEc '12)            Review of Systems  Constitutional: Negative for chills and fever.  Skin: Positive for rash and wound.       Objective:   Physical Exam  Constitutional: He is oriented to person, place, and time. He appears well-developed and well-nourished. No distress.  HENT:  Head: Normocephalic and atraumatic.  Pulmonary/Chest: Effort normal.  Neurological: He is alert and oriented to person, place, and time.  Skin: Skin is warm and dry. Rash noted.     Right dorsal wrist 3 x 3.5 cm erythematous patch with faint white pustules on periphery.  Center of wound with healing tear/eschar without discharge. Minimal induration of affected area without fluctuance.  Psychiatric: He has a normal mood and affect.  Vitals reviewed.  Vitals:   02/07/16 1609  BP: 122/70  Pulse: 72  Resp: 18  Temp: 98.3 F (36.8 C)  TempSrc: Oral  SpO2: 98%  Weight: 234 lb (106.1 kg)  Height: 5\' 7"  (1.702 m)        Assessment & Plan:    Jacob [name] is a 81 y.o. male Cellulitis of left upper extremity - Plan: cephALEXin (KEFLEX) 500 MG capsule, mupirocin ointment (BACTROBAN) 2 %, WOUND CULTURE  -initial skin tear, now with possible early cellulitis.   -restart keflex 500mg  BID., bactroban ointment. Wound cx obtained.   -rtc precautions given.   Meds ordered this encounter  Medications  . cephALEXin (KEFLEX) 500 MG capsule     Sig: Take 1 capsule (500 mg total) by mouth 2 (two) times daily.    Dispense:  20 capsule    Refill:  0  . mupirocin ointment (BACTROBAN) 2 %    Sig: Apply 1 application topically 3 (three) times daily.    Dispense:  22 g    Refill:  1   Patient Instructions   Keflex twice per day, soap and water twice per day, Bactroban ointment 2-3 times per day. If redness is not improving into next week, or any worsening in the next 3 days, return for recheck. I did check a wound culture, and will let you know if there are any changes needed based on those results.    Cellulitis, Adult Cellulitis is a skin infection. The infected area is usually red and tender. This condition occurs most often in the arms and lower legs. The infection can travel to the muscles, blood, and underlying tissue and become serious. It is very important to get treated for this condition. What are the causes? Cellulitis is caused by bacteria. The bacteria enter through a break in the skin, such as a cut, burn, insect bite, open sore, or crack. What increases the risk? This condition is more likely to occur in people who:  Have a weak defense system (immune system).  Have open wounds on the skin such as cuts, burns, bites, and scrapes. Bacteria can enter the body through these open wounds.  Are older.  Have diabetes.  Have a type of long-lasting (chronic) liver disease (cirrhosis) or kidney disease.  Use IV drugs. What are the signs or symptoms? Symptoms of this condition include:  Redness, streaking, or spotting on the skin.  Swollen area of the skin.  Tenderness or pain when an area of the skin is touched.  Warm skin.  Fever.  Chills.  Blisters. How is this diagnosed? This condition is diagnosed based on a medical history and physical exam. You may also have tests, including:  Blood  tests.  Lab tests.  Imaging tests. How is this treated? Treatment for this condition may include:  Medicines,  such as antibiotic medicines or antihistamines.  Supportive care, such as rest and application of cold or warm cloths (cold or warm compresses) to the skin.  Hospital care, if the condition is severe. The infection usually gets better within 1-2 days of treatment. Follow these instructions at home:  Take over-the-counter and prescription medicines only as told by your health care provider.  If you were prescribed an antibiotic medicine, take it as told by your health care provider. Do not stop taking the antibiotic even if you start to feel better.  Drink enough fluid to keep your urine clear or pale yellow.  Do not touch or rub the infected area.  Raise (elevate) the infected area above the level of your heart while you are sitting or lying down.  Apply warm or cold compresses to the affected area as told by your health care provider.  Keep all follow-up visits as told by your health care provider. This is important. These visits let your health care provider make sure a more serious infection is not developing. Contact a health care provider if:  You have a fever.  Your symptoms do not improve within 1-2 days of starting treatment.  Your bone or joint underneath the infected area becomes painful after the skin has healed.  Your infection returns in the same area or another area.  You notice a swollen bump in the infected area.  You develop new symptoms.  You have a general ill feeling (malaise) with muscle aches and pains. Get help right away if:  Your symptoms get worse.  You feel very sleepy.  You develop vomiting or diarrhea that persists.  You notice red streaks coming from the infected area.  Your red area gets larger or turns dark in color. This information is not intended to replace advice given to you by your health care provider. Make sure you discuss any questions you have with your health care provider. Document Released: 12/10/2004 Document Revised:  07/11/2015 Document Reviewed: 01/09/2015 Elsevier Interactive Patient Education  2017 [employer].   IF you received an x-ray today, you will receive an invoice from [facility]. Please contact [facility] at [phone] with questions or concerns regarding your invoice.   IF you received labwork today, you will receive an invoice from [employer]. Please contact [facility] at [phone] with questions or concerns regarding your invoice.   Our billing staff will not be able to assist you with questions regarding bills from these companies.  You will be contacted with the lab results as soon as they are available. The fastest way to get your results is to activate your My Chart account. Instructions are located on the last page of this paperwork. If you have not heard from [place] regarding the results in 2 weeks, please contact this office.     Signed,   Merri Ray, MD [facility].  02/09/16 12:52 PM

## 2016-02-07 NOTE — Patient Instructions (Addendum)
Keflex twice per day, soap and water twice per day, Bactroban ointment 2-3 times per day. If redness is not improving into next week, or any worsening in the next 3 days, return for recheck. I did check a wound culture, and will let you know if there are any changes needed based on those results.    Cellulitis, Adult Cellulitis is a skin infection. The infected area is usually red and tender. This condition occurs most often in the arms and lower legs. The infection can travel to the muscles, blood, and underlying tissue and become serious. It is very important to get treated for this condition. What are the causes? Cellulitis is caused by bacteria. The bacteria enter through a break in the skin, such as a cut, burn, insect bite, open sore, or crack. What increases the risk? This condition is more likely to occur in people who:  Have a weak defense system (immune system).  Have open wounds on the skin such as cuts, burns, bites, and scrapes. Bacteria can enter the body through these open wounds.  Are older.  Have diabetes.  Have a type of long-lasting (chronic) liver disease (cirrhosis) or kidney disease.  Use IV drugs. What are the signs or symptoms? Symptoms of this condition include:  Redness, streaking, or spotting on the skin.  Swollen area of the skin.  Tenderness or pain when an area of the skin is touched.  Warm skin.  Fever.  Chills.  Blisters. How is this diagnosed? This condition is diagnosed based on a medical history and physical exam. You may also have tests, including:  Blood tests.  Lab tests.  Imaging tests. How is this treated? Treatment for this condition may include:  Medicines, such as antibiotic medicines or antihistamines.  Supportive care, such as rest and application of cold or warm cloths (cold or warm compresses) to the skin.  Hospital care, if the condition is severe. The infection usually gets better within 1-2 days of  treatment. Follow these instructions at home:  Take over-the-counter and prescription medicines only as told by your health care provider.  If you were prescribed an antibiotic medicine, take it as told by your health care provider. Do not stop taking the antibiotic even if you start to feel better.  Drink enough fluid to keep your urine clear or pale yellow.  Do not touch or rub the infected area.  Raise (elevate) the infected area above the level of your heart while you are sitting or lying down.  Apply warm or cold compresses to the affected area as told by your health care provider.  Keep all follow-up visits as told by your health care provider. This is important. These visits let your health care provider make sure a more serious infection is not developing. Contact a health care provider if:  You have a fever.  Your symptoms do not improve within 1-2 days of starting treatment.  Your bone or joint underneath the infected area becomes painful after the skin has healed.  Your infection returns in the same area or another area.  You notice a swollen bump in the infected area.  You develop new symptoms.  You have a general ill feeling (malaise) with muscle aches and pains. Get help right away if:  Your symptoms get worse.  You feel very sleepy.  You develop vomiting or diarrhea that persists.  You notice red streaks coming from the infected area.  Your red area gets larger or turns dark in color. This  information is not intended to replace advice given to you by your health care provider. Make sure you discuss any questions you have with your health care provider. Document Released: 12/10/2004 Document Revised: 07/11/2015 Document Reviewed: 01/09/2015 Elsevier Interactive Patient Education  2017 [employer].   IF you received an x-ray today, you will receive an invoice from [facility]. Please contact [facility] at [phone] with questions or  concerns regarding your invoice.   IF you received labwork today, you will receive an invoice from [employer]. Please contact [facility] at [phone] with questions or concerns regarding your invoice.   Our billing staff will not be able to assist you with questions regarding bills from these companies.  You will be contacted with the lab results as soon as they are available. The fastest way to get your results is to activate your My Chart account. Instructions are located on the last page of this paperwork. If you have not heard from [place] regarding the results in 2 weeks, please contact this office.

## 2016-02-10 LAB — WOUND CULTURE
GRAM STAIN: NONE SEEN
Gram Stain: NONE SEEN
Gram Stain: NONE SEEN
ORGANISM ID, BACTERIA: NORMAL

## 2016-03-04 ENCOUNTER — Encounter: Payer: Self-pay | Admitting: Pulmonary Disease

## 2016-03-04 ENCOUNTER — Ambulatory Visit (INDEPENDENT_AMBULATORY_CARE_PROVIDER_SITE_OTHER): Payer: Medicare Other | Admitting: Pulmonary Disease

## 2016-03-04 DIAGNOSIS — G4733 Obstructive sleep apnea (adult) (pediatric): Secondary | ICD-10-CM

## 2016-03-04 DIAGNOSIS — R05 Cough: Secondary | ICD-10-CM | POA: Diagnosis not present

## 2016-03-04 DIAGNOSIS — R059 Cough, unspecified: Secondary | ICD-10-CM

## 2016-03-04 DIAGNOSIS — Z9989 Dependence on other enabling machines and devices: Secondary | ICD-10-CM | POA: Diagnosis not present

## 2016-03-04 MED ORDER — AZITHROMYCIN 250 MG PO TABS: ORAL_TABLET | ORAL | 0 refills | Status: DC

## 2016-03-04 NOTE — Patient Instructions (Signed)
Z pak We will try to find your CPAP pressure settings from [facility] Based on this we will send prescription for new CPAP machine

## 2016-03-04 NOTE — Assessment & Plan Note (Signed)
We will try to find your CPAP pressure settings from [facility] Based on this we will send prescription for new CPAP machine, if unable to find exact pressure we will send prescription for auto CPAP 10-15 cm and reviewed download and adjust exact pressure   Weight loss encouraged, compliance with goal of at least 4-6 hrs every night is the expectation. Advised against medications with sedative side effects Cautioned against driving when sleepy - understanding that sleepiness will vary on a day to day basis

## 2016-03-04 NOTE — Assessment & Plan Note (Signed)
Z-Pak for acute bronchitis

## 2016-03-04 NOTE — Progress Notes (Signed)
Subjective:    Patient ID: Jacob [name], male    DOB: 1933-06-27, 82 y.o.   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 His current machine is more than 80 years old and would like a new machine. PSG in 04/1998 when he weighed 295 pounds showed severe OSA with AHI of 99/hour which was corrected by CPAP of 9 cm Repeat PSG 01/2002 when he had lost some weight showed AHI of 57/hour.  He feels that the CPAP pressure has been increased but does not know the exact pressure. He reports good results and CPAP with improvement in his daytime somnolence and fatigue. Epworth sleepiness score is 8 He has always in a short sleeper) his wife only needs about 5 hours of sleep. Bedtime is anywhere between midnight to 2 AM, sleep latency is minimal, he sleeps on his side with one pillow and according to his wife rests well without and snoring. He only has one nocturnal awakening and is out of bed by 5:30 AM feeling rested without dryness of mouth or headaches. He occasionally naps during the daytime while watching TV. He is lost weight down to about 190 pounds but has gained about 50 pounds back to his current weight of 243 pounds.  There is no history suggestive of cataplexy, sleep paralysis or parasomnias  He does drink 2-3 cups of coffee today Interval 1 pacemaker installed by Dr. Lovena Le in 08/2014   Past Medical History:  Diagnosis Date  . Anemia   . Arthritis    "middle finger both hands" (09/05/2014)  . Basal cell carcinoma   . Benign prostatic hypertrophy    hx of  . Colon polyp 2006, 2010   TUBULAR ADENOMA  . Diverticulosis of colon (without mention of hemorrhage)   . First degree atrioventricular block   . GERD (gastroesophageal reflux disease)   . Gout   . History of blood transfusion 12/1953; 03/1954   Transfused 7 pints blood '55, 4 pints '56  . History of duodenal ulcer 1955   w/hemorrhage  . Kidney  stones    "passed them"  . Morbid obesity ([place])    "I was 294; lost 100# 2014-2015"  . MVP (mitral valve prolapse)   . OSA (obstructive sleep apnea)    "stopped wearing mask after I lost 100#"  . Osteoarthritis   . Other thalassemia ([place])   . Pneumonia "a number of times"  . Presence of permanent cardiac pacemaker   . Prostate cancer [facility])    Past Surgical History:  Procedure Laterality Date  . CATARACT EXTRACTION W/ INTRAOCULAR LENS  IMPLANT, BILATERAL  2002-2004  . CLOSED REDUCTION ANKLE DISLOCATION Right 1980's   "toe my achilles"  . COLONOSCOPY  2015   multiple   . EP IMPLANTABLE DEVICE N/A 09/05/2014   Procedure: Pacemaker Implant;  Surgeon: [name], MD;  Location: [place] [facility];  Service: Cardiovascular;  Laterality: N/A;  . ESOPHAGOGASTRODUODENOSCOPY  2011   multiple  . GASTRECTOMY  03/1954   Transfused 7 pints blood '55, 4 pints '56  . INSERT / REPLACE / REMOVE PACEMAKER  09/05/2014  . MOLE REMOVAL Left 2008   "forearm"  . TUMOR REMOVAL Left 2002   from finger- left index    Allergies  Allergen Reactions  . Prednisone     REACTION: GI bleed  . Sulfonamide Derivatives     REACTION: Rash     Social History  Social History  . Marital status: Married    Spouse name: ANn  . Number of children: 8  . Years of education: 18+   Occupational History  . executive     retired   Social History Main Topics  . Smoking status: Former Smoker    Packs/day: 3.00    Years: 20.00    Types: Cigarettes    Start date: 01/21/1970  . Smokeless tobacco: Never Used     Comment: quite smoking cigarettes in 1971  . Alcohol use Yes     Comment: "no alcohol since 1987"  . Drug use: No  . Sexual activity: Yes    Partners: Female   Other Topics Concern  . 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 Eight Daughters '60, '62, '66, '70, '73, '74, '78, '83; 19 grandchildren. Lives independently with wife. ACP-  asked pt to think about these issues (DEc '12)           Family History  Problem Relation Age of Onset  . Coronary artery disease Father   . Heart attack Father   . Gout Father   . Cancer Daughter     skin  . Colon cancer Neg Hx      Review of Systems Constitutional: negative for anorexia, fevers and sweats  Eyes: negative for irritation, redness and visual disturbance  Ears, nose, mouth, throat, and face: negative for earaches, epistaxis, nasal congestion and sore throat  Respiratory: negative for cough, dyspnea on exertion, sputum and wheezing  Cardiovascular: negative for chest pain, dyspnea, lower extremity edema, orthopnea, palpitations and syncope  Gastrointestinal: negative for abdominal pain, constipation, diarrhea, melena, nausea and vomiting  Genitourinary:negative for dysuria, frequency and hematuria  Hematologic/lymphatic: negative for bleeding, easy bruising and lymphadenopathy  Musculoskeletal:negative for arthralgias, muscle weakness and stiff joints  Neurological: negative for coordination problems, gait problems, headaches and weakness  Endocrine: negative for diabetic symptoms including polydipsia, polyuria and weight loss      Objective:   Physical Exam  Gen. Pleasant, obese, in no distress, normal affect ENT - no lesions, no post nasal drip, class 2-3 airway Neck: No JVD, no thyromegaly, no carotid bruits Lungs: no use of accessory muscles, no dullness to percussion, decreased without rales or rhonchi  Cardiovascular: Rhythm regular, heart sounds  normal, no murmurs or gallops, no peripheral edema Abdomen: soft and non-tender, no hepatosplenomegaly, BS normal. Musculoskeletal: No deformities, no cyanosis or clubbing Neuro:  alert, non focal, no tremors       Assessment & Plan:

## 2016-03-04 NOTE — Addendum Note (Signed)
Addended by: Benson Setting L on: 03/04/2016 02:52 PM   Modules accepted: Orders

## 2016-03-05 ENCOUNTER — Ambulatory Visit (INDEPENDENT_AMBULATORY_CARE_PROVIDER_SITE_OTHER): Payer: Medicare Other | Admitting: Cardiology

## 2016-03-05 ENCOUNTER — Encounter: Payer: Self-pay | Admitting: Cardiology

## 2016-03-05 VITALS — BP 138/58 | HR 63 | Ht 67.0 in | Wt 241.0 lb

## 2016-03-05 DIAGNOSIS — I340 Nonrheumatic mitral (valve) insufficiency: Secondary | ICD-10-CM

## 2016-03-05 DIAGNOSIS — I480 Paroxysmal atrial fibrillation: Secondary | ICD-10-CM | POA: Diagnosis not present

## 2016-03-05 DIAGNOSIS — I059 Rheumatic mitral valve disease, unspecified: Secondary | ICD-10-CM

## 2016-03-05 NOTE — Patient Instructions (Addendum)
Medication Instructions:  Continue current medications  Labwork: None Ordered  Testing/Procedures: Your physician has requested that you have an echocardiogram in March 2018. Echocardiography is a painless test that uses sound waves to create images of your heart. It provides your doctor with information about the size and shape of your heart and how well your heart's chambers and valves are working. This procedure takes approximately one hour. There are no restrictions for this procedure.  Follow-Up: Your physician wants you to follow-up in: 1 Year. You will receive a reminder letter in the mail two months in advance. If you don't receive a letter, please call our office to schedule the follow-up appointment.   Any Other Special Instructions Will Be Listed Below (If Applicable).            HAPPY HOLIDAY   If you need a refill on your cardiac medications before your next appointment, please call your pharmacy.

## 2016-03-05 NOTE — Progress Notes (Signed)
HPI The patient presents for followup of his slow heart rate, fib/flutter.  He is status post pacemaker placement.  Since I last saw him he has done well. He's walking daily.  The patient denies any new symptoms such as chest discomfort, neck or arm discomfort. There has been no new shortness of breath, PND or orthopnea. There have been no reported palpitations, presyncope or syncope.  Allergies  Allergen Reactions  . Prednisone     REACTION: GI bleed  . Sulfonamide Derivatives     REACTION: Rash    Current Outpatient Prescriptions  Medication Sig Dispense Refill  . acetaminophen (TYLENOL) 500 MG tablet Take 500 mg by mouth every 6 (six) hours as needed (pain).     Marland Kitchen allopurinol (ZYLOPRIM) 300 MG tablet Take 1 tablet (300 mg total) by mouth daily. 90 tablet 3  . Ascorbic Acid (VITAMIN C PO) Take 1 capsule by mouth daily as needed (supplement).    Marland Kitchen azithromycin (ZITHROMAX) 250 MG tablet Take as directed 6 tablet 0  . b complex vitamins capsule Take 1 capsule by mouth daily.    . cephALEXin (KEFLEX) 500 MG capsule Take 1 capsule (500 mg total) by mouth 2 (two) times daily. 20 capsule 0  . Cholecalciferol (VITAMIN D3) 2000 UNITS capsule Take 1 capsule (2,000 Units total) by mouth daily.    . Coenzyme Q10 300 MG CAPS Take 300 mg by mouth daily.    . colchicine 0.6 MG tablet Take 0.6 mg by mouth daily as needed (gout).     . Cyanocobalamin (VITAMIN B 12 PO) Take 1,000 mg by mouth daily.     Marland Kitchen FeFum-FePoly-FA-B Cmp-C-Biot (INTEGRA PLUS) CAPS Take 1 capsule by mouth every morning. 90 capsule 1  . fexofenadine (ALLEGRA) 180 MG tablet Take 180 mg by mouth daily.     . furosemide (LASIX) 40 MG tablet Take 1 tablets (40 mg) by mouth daily 30 tablet 6  . ibuprofen (ADVIL,MOTRIN) 200 MG tablet Take 200 mg by mouth every 6 (six) hours as needed for pain.    . mupirocin ointment (BACTROBAN) 2 % Apply 1 application topically 3 (three) times daily. 22 g 1  . Omega-3 Fatty Acids (FISH OIL) 600 MG CAPS  Take 600 mg by mouth daily.     . tamsulosin (FLOMAX) 0.4 MG CAPS capsule Take 0.4 mg by mouth daily.  11  . XARELTO 20 MG TABS tablet TAKE 1 TABLET(20 MG) BY MOUTH DAILY WITH SUPPER 30 tablet 8   No current facility-administered medications for this visit.     Past Medical History:  Diagnosis Date  . Anemia   . Arthritis    "middle finger both hands" (09/05/2014)  . Basal cell carcinoma   . Benign prostatic hypertrophy    hx of  . Colon polyp 2006, 2010   TUBULAR ADENOMA  . Diverticulosis of colon (without mention of hemorrhage)   . First degree atrioventricular block   . GERD (gastroesophageal reflux disease)   . Gout   . History of blood transfusion 12/1953; 03/1954   Transfused 7 pints blood '55, 4 pints '56  . History of duodenal ulcer 1955   w/hemorrhage  . Kidney stones    "passed them"  . Morbid obesity ([place])    "I was 294; lost 100# 2014-2015"  . MVP (mitral valve prolapse)   . OSA (obstructive sleep apnea)    "stopped wearing mask after I lost 100#"  . Osteoarthritis   . Other thalassemia ([place])   .  Pneumonia "a number of times"  . Presence of permanent cardiac pacemaker   . Prostate cancer [facility])     Past Surgical History:  Procedure Laterality Date  . CATARACT EXTRACTION W/ INTRAOCULAR LENS  IMPLANT, BILATERAL  2002-2004  . CLOSED REDUCTION ANKLE DISLOCATION Right 1980's   "toe my achilles"  . COLONOSCOPY  2015   multiple   . EP IMPLANTABLE DEVICE N/A 09/05/2014   Procedure: Pacemaker Implant;  Surgeon: [name], MD;  Location: [place] [facility];  Service: Cardiovascular;  Laterality: N/A;  . ESOPHAGOGASTRODUODENOSCOPY  2011   multiple  . GASTRECTOMY  03/1954   Transfused 7 pints blood '55, 4 pints '56  . INSERT / REPLACE / REMOVE PACEMAKER  09/05/2014  . MOLE REMOVAL Left 2008   "forearm"  . TUMOR REMOVAL Left 2002   from finger- left index    ROS:  As stated in the HPI and negative for all other systems.  PHYSICAL EXAM BP (!) 138/58   Pulse  63   Ht 5\' 7"  (1.702 m)   Wt 241 lb (109.3 kg)   BMI 37.75 kg/m  GENERAL:  Well appearing NECK:  No jugular venous distention, waveform within normal limits, carotid upstroke brisk and symmetric, no bruits, no thyromegaly LUNGS:  Clear to auscultation bilaterally CHEST:  Well healed pacemaker pocket.   HEART:  PMI not displaced or sustained,S1 and S2 within normal limits, no S3, no S4, no clicks, no rubs, no murmurs ABD:  Flat, positive bowel sounds normal in frequency in pitch, no bruits, no rebound, no guarding, no midline pulsatile mass, no hepatomegaly, no splenomegaly, obese EXT:  2 plus pulses throughout, mild ankle edema, no cyanosis no clubbing NEURO:  Nonfocal  EKG:  Sinus rhythm with ventricular pacing, occasional atrial paced beats.  03/05/2016  CBC    Component Value Date/Time   WBC 7.1 01/24/2016 1032   WBC 6.2 12/20/2014 1158   RBC 5.69 01/24/2016 1032   RBC 5.73 12/20/2014 1158   HGB 11.3 (L) 01/24/2016 1032   HCT 36.5 (L) 01/24/2016 1032   PLT 162 01/24/2016 1032   MCV 64.1 (L) 01/24/2016 1032   MCH 19.8 (L) 01/24/2016 1032   MCH 17.3 (L) 12/20/2014 1158   MCHC 31.0 (L) 01/24/2016 1032   MCHC 30.1 12/20/2014 1158   RDW 16.6 (H) 01/24/2016 1032   LYMPHSABS 1.6 01/24/2016 1032   MONOABS 0.8 01/24/2016 1032   EOSABS 0.2 01/24/2016 1032   BASOSABS 0.1 01/24/2016 1032    ASSESSMENT AND PLAN  ATRIAL FIB -   Mr. Anas Keye Thaw has a CHA2DS2 - VASc score of 2 with a risk of stroke of 2.2%.  No change in therapy is indicated. He tolerates his anticoagulation.    BRADYCARDIA - This is now treated post pacemaker  EDEMA - This is mild and stable. No change in therapy is indicated.  MR - This was mild to moderate in the past. No change in therapy is indicated.  I will follow-up with an echo in March.

## 2016-03-11 ENCOUNTER — Ambulatory Visit: Payer: Medicare Other | Admitting: Cardiology

## 2016-03-12 ENCOUNTER — Ambulatory Visit (INDEPENDENT_AMBULATORY_CARE_PROVIDER_SITE_OTHER): Payer: Medicare Other | Admitting: *Deleted

## 2016-03-12 ENCOUNTER — Telehealth: Payer: Self-pay

## 2016-03-12 DIAGNOSIS — I442 Atrioventricular block, complete: Secondary | ICD-10-CM

## 2016-03-12 DIAGNOSIS — G4733 Obstructive sleep apnea (adult) (pediatric): Secondary | ICD-10-CM

## 2016-03-12 NOTE — Telephone Encounter (Signed)
Called pt. And informed him that RA was not able to find out what his settings of his machine was from [facility] but he verbalized it was ok to place and order for his cpap machine and for the pt. To follow up in 1 month. Pt. Understood, The order has been placed nothing further is needed at this time.

## 2016-03-12 NOTE — Progress Notes (Signed)
Remote pacemaker transmission.   

## 2016-03-13 ENCOUNTER — Encounter: Payer: Self-pay | Admitting: Cardiology

## 2016-03-16 ENCOUNTER — Other Ambulatory Visit: Payer: Self-pay | Admitting: Internal Medicine

## 2016-03-16 DIAGNOSIS — D568 Other thalassemias: Secondary | ICD-10-CM

## 2016-03-17 ENCOUNTER — Telehealth: Payer: Self-pay | Admitting: Pulmonary Disease

## 2016-03-17 DIAGNOSIS — G4733 Obstructive sleep apnea (adult) (pediatric): Secondary | ICD-10-CM

## 2016-03-17 LAB — CUP PACEART REMOTE DEVICE CHECK
Battery Remaining Longevity: 90 mo
Brady Statistic AP VS Percent: 0.72 %
Brady Statistic AS VP Percent: 18.32 %
Brady Statistic AS VS Percent: 1.24 %
Date Time Interrogation Session: 20171228141609
Implantable Lead Implant Date: 20160622
Implantable Lead Location: 753859
Implantable Lead Model: 5076
Implantable Lead Model: 5076
Lead Channel Impedance Value: 323 Ohm
Lead Channel Pacing Threshold Amplitude: 0.625 V
Lead Channel Pacing Threshold Amplitude: 0.625 V
Lead Channel Pacing Threshold Pulse Width: 0.4 ms
Lead Channel Sensing Intrinsic Amplitude: 13.125 mV
Lead Channel Sensing Intrinsic Amplitude: 13.125 mV
Lead Channel Sensing Intrinsic Amplitude: 5.375 mV
Lead Channel Sensing Intrinsic Amplitude: 5.375 mV
Lead Channel Setting Pacing Amplitude: 2.5 V
Lead Channel Setting Pacing Pulse Width: 0.4 ms
MDC IDC LEAD IMPLANT DT: 20160622
MDC IDC LEAD LOCATION: 753860
MDC IDC MSMT BATTERY VOLTAGE: 3.01 V
MDC IDC MSMT LEADCHNL RA IMPEDANCE VALUE: 437 Ohm
MDC IDC MSMT LEADCHNL RA PACING THRESHOLD PULSEWIDTH: 0.4 ms
MDC IDC MSMT LEADCHNL RV IMPEDANCE VALUE: 342 Ohm
MDC IDC MSMT LEADCHNL RV IMPEDANCE VALUE: 418 Ohm
MDC IDC PG IMPLANT DT: 20160622
MDC IDC SET LEADCHNL RA PACING AMPLITUDE: 2 V
MDC IDC SET LEADCHNL RV SENSING SENSITIVITY: 4 mV
MDC IDC STAT BRADY AP VP PERCENT: 79.72 %
MDC IDC STAT BRADY RA PERCENT PACED: 75.64 %
MDC IDC STAT BRADY RV PERCENT PACED: 97.03 %

## 2016-03-17 NOTE — Telephone Encounter (Signed)
RA  Please Advise-  Spoke with [facility] and they stated that in order for this pt. Receive a new machine he will need a new sleep study due to a break in therpay . He stated we could order a home sleep study. In the pt.'s chart under procedures on 05/13/1998 you are able to see his polysomnography.

## 2016-03-17 NOTE — Telephone Encounter (Signed)
I did not get any history a bout break in therapy Pl let pt know, ask him about break  & order home sleep study

## 2016-03-18 NOTE — Telephone Encounter (Signed)
lmtcb x1 for pt. 

## 2016-03-19 NOTE — Telephone Encounter (Signed)
Spoke with pt. He is aware that he will have to repeat a sleep study. Order has been placed for home sleep study. Nothing further was needed.

## 2016-04-09 DIAGNOSIS — G4733 Obstructive sleep apnea (adult) (pediatric): Secondary | ICD-10-CM | POA: Diagnosis not present

## 2016-04-10 ENCOUNTER — Telehealth: Payer: Self-pay | Admitting: Pulmonary Disease

## 2016-04-10 ENCOUNTER — Other Ambulatory Visit: Payer: Self-pay | Admitting: *Deleted

## 2016-04-10 DIAGNOSIS — G4733 Obstructive sleep apnea (adult) (pediatric): Secondary | ICD-10-CM

## 2016-04-10 NOTE — Telephone Encounter (Signed)
Per Dr. Elsworth Soho, the HST showed severe OSA 49 moments per hour. Will obtain CPAP settings from [place]. Then will send RX for CPAP to [facility] per patient's permission.

## 2016-04-14 ENCOUNTER — Other Ambulatory Visit: Payer: Self-pay | Admitting: Cardiology

## 2016-04-15 NOTE — Telephone Encounter (Signed)
Spoke with patient about results. Patient verbalized understanding. No further questions. Will place an order to [facility] now that we have the settings for the previous machine.

## 2016-06-03 ENCOUNTER — Other Ambulatory Visit (HOSPITAL_COMMUNITY): Payer: Medicare Other

## 2016-06-11 ENCOUNTER — Ambulatory Visit (INDEPENDENT_AMBULATORY_CARE_PROVIDER_SITE_OTHER): Payer: Medicare Other | Admitting: *Deleted

## 2016-06-11 DIAGNOSIS — I442 Atrioventricular block, complete: Secondary | ICD-10-CM

## 2016-06-11 NOTE — Progress Notes (Signed)
Remote pacemaker transmission.   

## 2016-06-12 ENCOUNTER — Encounter: Payer: Self-pay | Admitting: Cardiology

## 2016-06-12 LAB — CUP PACEART REMOTE DEVICE CHECK
Battery Remaining Longevity: 87 mo
Brady Statistic AP VS Percent: 0.75 %
Brady Statistic RA Percent Paced: 77.93 %
Date Time Interrogation Session: 20180329132903
Implantable Lead Implant Date: 20160622
Implantable Lead Location: 753859
Implantable Lead Location: 753860
Implantable Lead Model: 5076
Implantable Lead Model: 5076
Lead Channel Impedance Value: 304 Ohm
Lead Channel Impedance Value: 399 Ohm
Lead Channel Pacing Threshold Pulse Width: 0.4 ms
Lead Channel Sensing Intrinsic Amplitude: 12.375 mV
Lead Channel Sensing Intrinsic Amplitude: 12.375 mV
Lead Channel Sensing Intrinsic Amplitude: 5.125 mV
Lead Channel Sensing Intrinsic Amplitude: 5.125 mV
Lead Channel Setting Pacing Amplitude: 2.5 V
Lead Channel Setting Pacing Pulse Width: 0.4 ms
MDC IDC LEAD IMPLANT DT: 20160622
MDC IDC MSMT BATTERY VOLTAGE: 3.01 V
MDC IDC MSMT LEADCHNL RA IMPEDANCE VALUE: 418 Ohm
MDC IDC MSMT LEADCHNL RA PACING THRESHOLD AMPLITUDE: 0.625 V
MDC IDC MSMT LEADCHNL RA PACING THRESHOLD PULSEWIDTH: 0.4 ms
MDC IDC MSMT LEADCHNL RV IMPEDANCE VALUE: 342 Ohm
MDC IDC MSMT LEADCHNL RV PACING THRESHOLD AMPLITUDE: 0.625 V
MDC IDC PG IMPLANT DT: 20160622
MDC IDC SET LEADCHNL RA PACING AMPLITUDE: 2 V
MDC IDC SET LEADCHNL RV SENSING SENSITIVITY: 4 mV
MDC IDC STAT BRADY AP VP PERCENT: 82.37 %
MDC IDC STAT BRADY AS VP PERCENT: 15.75 %
MDC IDC STAT BRADY AS VS PERCENT: 1.13 %
MDC IDC STAT BRADY RV PERCENT PACED: 96.27 %

## 2016-06-21 ENCOUNTER — Emergency Department (HOSPITAL_BASED_OUTPATIENT_CLINIC_OR_DEPARTMENT_OTHER)
Admission: EM | Admit: 2016-06-21 | Discharge: 2016-06-21 | Disposition: A | Discharge status: Home / Self Care | Payer: Medicare Other | Attending: Emergency Medicine | Admitting: Emergency Medicine

## 2016-06-21 ENCOUNTER — Encounter (HOSPITAL_BASED_OUTPATIENT_CLINIC_OR_DEPARTMENT_OTHER): Payer: Self-pay | Admitting: *Deleted

## 2016-06-21 DIAGNOSIS — Z79899 Other long term (current) drug therapy: Secondary | ICD-10-CM | POA: Diagnosis not present

## 2016-06-21 DIAGNOSIS — L03114 Cellulitis of left upper limb: Secondary | ICD-10-CM | POA: Diagnosis not present

## 2016-06-21 DIAGNOSIS — Z4800 Encounter for change or removal of nonsurgical wound dressing: Secondary | ICD-10-CM | POA: Diagnosis present

## 2016-06-21 DIAGNOSIS — Z87891 Personal history of nicotine dependence: Secondary | ICD-10-CM | POA: Diagnosis not present

## 2016-06-21 DIAGNOSIS — Z23 Encounter for immunization: Secondary | ICD-10-CM | POA: Insufficient documentation

## 2016-06-21 MED ORDER — CEPHALEXIN 250 MG PO CAPS
500.0000 mg | ORAL_CAPSULE | Freq: Once | ORAL | Status: AC
  Administered 2016-06-21: 500 mg via ORAL
  Filled 2016-06-21: qty 2

## 2016-06-21 MED ORDER — CEPHALEXIN 500 MG PO CAPS: 500.0000 mg | ORAL_CAPSULE | Freq: Three times a day (TID) | ORAL | 0 refills | Status: DC

## 2016-06-21 MED ORDER — TETANUS-DIPHTH-ACELL PERTUSSIS 5-2.5-18.5 LF-MCG/0.5 IM SUSP
0.5000 mL | Freq: Once | INTRAMUSCULAR | Status: AC
  Administered 2016-06-21: 0.5 mL via INTRAMUSCULAR
  Filled 2016-06-21: qty 0.5

## 2016-06-21 NOTE — ED Provider Notes (Signed)
[facility] DEPT [facility] Provider Note   CSN: [id number] Arrival date & time: 06/21/16  1757  By signing my name below, I, Jaquelyn Bitter., attest that this documentation has been prepared under the direction and in the presence of Malvin Johns, MD. Electronically signed: Jaquelyn Bitter., ED Scribe. 06/21/16. 6:58 PM.   History   Chief Complaint Chief Complaint  Patient presents with  . Wound Check    HPI Jacob [name] is a 83 y.o. male with hx of pacemaker, A-fib who presents to the Emergency Department complaining of wound check with onset x4 days. Pt states that he cut the L forearm on a trim edge x4 days ago and has since noticed redness to the arm. x2 hours ago, when pt took the band aid off, he noticed redness to the area. Pt has used triple antibiotic ointment with no relief. He denies fever. Of note, pt reports blood thinner use for a-fib (Xarelto.) He states that his wound starts bleeding again every time he takes the dressing off.  The history is provided by the patient. No language interpreter was used.    Past Medical History:  Diagnosis Date  . Anemia   . Arthritis    "middle finger both hands" (09/05/2014)  . Basal cell carcinoma   . Benign prostatic hypertrophy    hx of  . Colon polyp 2006, 2010   TUBULAR ADENOMA  . Diverticulosis of colon (without mention of hemorrhage)   . First degree atrioventricular block   . GERD (gastroesophageal reflux disease)   . Gout   . History of blood transfusion 12/1953; 03/1954   Transfused 7 pints blood '55, 4 pints '56  . History of duodenal ulcer 1955   w/hemorrhage  . Kidney stones    "passed them"  . Morbid obesity ([place])    "I was 294; lost 100# 2014-2015"  . MVP (mitral valve prolapse)   . OSA (obstructive sleep apnea)    "stopped wearing mask after I lost 100#"  . Osteoarthritis   . Other thalassemia ([place])   . Pneumonia "a number of times"  . Presence of permanent cardiac pacemaker   .  Prostate cancer [facility])     Patient Active Problem List   Diagnosis Date Noted  . Atrial fibrillation ([place]) 09/13/2014  . Pacemaker 09/05/2014  . Complete heart block ([place]) 08/07/2014  . Unspecified constipation 09/05/2013  . Iron deficiency anemia 02/28/2013  . Need for prophylactic vaccination and inoculation against influenza 12/31/2011  . Acute bronchitis 02/13/2011  . Routine health maintenance 01/20/2011  . FLATULENCE-GAS-BLOATING 10/14/2009  . GERD 09/18/2009  . PERSONAL HX COLONIC POLYPS 09/18/2009  . PALPITATIONS 11/20/2008  . MURMUR 11/20/2008  . LABRYNTHITIS 09/26/2008  . HEMATURIA UNSPECIFIED 07/04/2008  . DIVERTICULITIS, HX OF 07/04/2008  . COUGH 05/24/2008  . Gout, unspecified 08/29/2007  . Essential hypertension 08/29/2007  . CHRONIC RHINITIS 08/29/2007  . BENIGN PROSTATIC HYPERTROPHY 08/29/2007  . OSTEOARTHRITIS 08/29/2007  . INTERMITTENT VERTIGO 08/29/2007  . DYSLIPIDEMIA 12/08/2006  . THALASSEMIA NEC 12/08/2006  . Mitral valve disorder 12/08/2006  . BLOCK, AV, 1ST DEGREE 12/08/2006  . PEPTIC ULCER DISEASE 12/08/2006  . DIVERTICULOSIS, COLON 12/08/2006  . OSA on CPAP 12/08/2006  . CATARACT, HX OF 12/08/2006  . NEPHROLITHIASIS, HX OF 12/08/2006  . MORBID OBESITY, HX OF 12/08/2006  . GASTRECTOMY, HX OF 12/08/2006  . CATARACT EXTRACTION, HX OF 12/08/2006    Past Surgical History:  Procedure Laterality Date  . CATARACT EXTRACTION W/ INTRAOCULAR LENS  IMPLANT, BILATERAL  2002-2004  . CLOSED REDUCTION ANKLE DISLOCATION Right 1980's   "toe my achilles"  . COLONOSCOPY  2015   multiple   . EP IMPLANTABLE DEVICE N/A 09/05/2014   Procedure: Pacemaker Implant;  Surgeon: [name], MD;  Location: [place] [facility];  Service: Cardiovascular;  Laterality: N/A;  . ESOPHAGOGASTRODUODENOSCOPY  2011   multiple  . GASTRECTOMY  03/1954   Transfused 7 pints blood '55, 4 pints '56  . GASTRECTOMY  1956   partial  . INSERT / REPLACE / REMOVE PACEMAKER  09/05/2014  .  MOLE REMOVAL Left 2008   "forearm"  . TUMOR REMOVAL Left 2002   from finger- left index       Home Medications    Prior to Admission medications   Medication Sig Start Date End Date Taking? Authorizing Provider  allopurinol (ZYLOPRIM) 300 MG tablet Take 1 tablet (300 mg total) by mouth daily. 05/04/13  Yes Neena Rhymes, MD  Ascorbic Acid (VITAMIN C PO) Take 1 capsule by mouth daily as needed (supplement).   Yes Historical Provider, MD  b complex vitamins capsule Take 1 capsule by mouth daily.   Yes Historical Provider, MD  Cholecalciferol (VITAMIN D3) 2000 UNITS capsule Take 1 capsule (2,000 Units total) by mouth daily. 11/02/12  Yes Neena Rhymes, MD  Coenzyme Q10 300 MG CAPS Take 300 mg by mouth daily.   Yes Historical Provider, MD  colchicine 0.6 MG tablet Take 0.6 mg by mouth daily as needed (gout).  05/08/13  Yes Neena Rhymes, MD  Cyanocobalamin (VITAMIN B 12 PO) Take 1,000 mg by mouth daily.    Yes Historical Provider, MD  FeFum-FePoly-FA-B Cmp-C-Biot (INTEGRA PLUS) CAPS TAKE 1 CAPSULE BY MOUTH EVERY MORNING 03/16/16  Yes Curt Bears, MD  fexofenadine (ALLEGRA) 180 MG tablet Take 180 mg by mouth daily.    Yes Historical Provider, MD  furosemide (LASIX) 40 MG tablet TAKE 1 TABLET(40 MG) BY MOUTH DAILY 04/15/16  Yes [name], MD  Omega-3 Fatty Acids (FISH OIL) 600 MG CAPS Take 600 mg by mouth daily.    Yes Historical Provider, MD  tamsulosin (FLOMAX) 0.4 MG CAPS capsule Take 0.4 mg by mouth daily. 11/13/13  Yes Historical Provider, MD  XARELTO 20 MG TABS tablet TAKE 1 TABLET(20 MG) BY MOUTH DAILY WITH SUPPER 12/02/15  Yes [name], MD  acetaminophen (TYLENOL) 500 MG tablet Take 500 mg by mouth every 6 (six) hours as needed (pain).     Historical Provider, MD  azithromycin (ZITHROMAX) 250 MG tablet Take as directed 03/04/16   Rigoberto Noel, MD  cephALEXin (KEFLEX) 500 MG capsule Take 1 capsule (500 mg total) by mouth 3 (three) times daily. 06/21/16   Malvin Johns, MD    ibuprofen (ADVIL,MOTRIN) 200 MG tablet Take 200 mg by mouth every 6 (six) hours as needed for pain.    Historical Provider, MD  mupirocin ointment (BACTROBAN) 2 % Apply 1 application topically 3 (three) times daily. 02/07/16   Wendie Agreste, MD    Family History Family History  Problem Relation Age of Onset  . Coronary artery disease Father   . Heart attack Father   . Gout Father   . Cancer Daughter     skin  . Colon cancer Neg Hx     Social History Social History  Substance Use Topics  . Smoking status: Former Smoker    Packs/day: 3.00    Years: 20.00    Types: Cigarettes  Start date: 01/21/1970  . Smokeless tobacco: Never Used     Comment: quite smoking cigarettes in 1971  . Alcohol use No     Comment: "no alcohol since 1987"     Allergies   Prednisone and Sulfonamide derivatives   Review of Systems Review of Systems  Constitutional: Negative for fever.  Gastrointestinal: Negative for nausea and vomiting.  Musculoskeletal: Negative for arthralgias, back pain, joint swelling and neck pain.  Skin: Positive for wound.  Neurological: Negative for weakness, numbness and headaches.     Physical Exam Updated Vital Signs BP 138/67 (BP Location: Left Arm)   Pulse 66   Temp 98.1 F (36.7 C) (Oral)   Resp 18   Ht 5\' 7"  (1.702 m)   Wt 240 lb (108.9 kg)   SpO2 100%   BMI 37.59 kg/m   Physical Exam  Constitutional: He is oriented to person, place, and time. He appears well-developed and well-nourished.  HENT:  Head: Normocephalic and atraumatic.  Neck: Normal range of motion. Neck supple.  Cardiovascular: Normal rate.   Pulmonary/Chest: Effort normal.  Musculoskeletal: He exhibits edema and tenderness.  3cm skin tear to left forearm with about 4-5 cm of surrounding warmth and erythema. There is mild oozing of blood from the wound. He has normal motor function and sensation distally.  Neurological: He is alert and oriented to person, place, and time.  Skin:  Skin is warm and dry.  Psychiatric: He has a normal mood and affect.       ED Treatments / Results   DIAGNOSTIC STUDIES: Oxygen Saturation is 100% on RA, normal by my interpretation.   COORDINATION OF CARE: 6:58 PM-Discussed next steps with pt. Pt verbalized understanding and is agreeable with the plan.    Labs (all labs ordered are listed, but only abnormal results are displayed) Labs Reviewed - No data to display  EKG  EKG Interpretation None       Radiology No results found.  Procedures Procedures (including critical care time)  Medications Ordered in ED Medications  Tdap (BOOSTRIX) injection 0.5 mL (not administered)  cephALEXin (KEFLEX) capsule 500 mg (not administered)     Initial Impression / Assessment and Plan / ED Course  I have reviewed the triage vital signs and the nursing notes.  Pertinent labs & imaging results that were available during my care of the patient were reviewed by me and considered in my medical decision making (see chart for details).     Patient presents with a skin tear. There is evidence of surrounding cellulitis. No purulent drainage. He's had some problems with consistent oozing from the wound. There is no active bleeding currently. The wound was cleaned and dressed with a nonstick dressing in the ED. His tetanus shot was updated. I will start him on Keflex. I advised him to stop taking the Xarelto for 2 days and then restart it. I advised him to leave her dressing on for 24 hours and then remove it gently after that. I advised him to follow-up with his PCP for recheck or return here as needed for any worsening symptoms.  Final Clinical Impressions(s) / ED Diagnoses   Final diagnoses:  Cellulitis of left upper extremity    New Prescriptions New Prescriptions   CEPHALEXIN (KEFLEX) 500 MG CAPSULE    Take 1 capsule (500 mg total) by mouth 3 (three) times daily.   I personally performed the services described in this  documentation, which was scribed in my presence.  The recorded information  has been reviewed and considered.     Malvin Johns, MD 06/21/16 [phone]

## 2016-06-21 NOTE — ED Triage Notes (Signed)
Pt reports that he hit his L lower arm on the side of a column in a restaurant this past Thursday. Pt has skin tear to L forearm with surrounding redness and some swelling. Denies fever, pus. Pt currently taking Xarelto.

## 2016-06-21 NOTE — ED Notes (Signed)
Laceration to left forearm, with redness to the surrounding tissue. Patient states he struck the arm on a column at a local [employer] on Thursday. Patient states he has applied bandages and when he removes the bandage it opens the wound and it starts bleeding again. Patient also treating with antibiotic ointment at home. Patient is currently on Xarelto for AFib.   MD at bedside.

## 2016-06-22 ENCOUNTER — Other Ambulatory Visit: Payer: Self-pay | Admitting: Internal Medicine

## 2016-06-22 DIAGNOSIS — D568 Other thalassemias: Secondary | ICD-10-CM

## 2016-07-27 ENCOUNTER — Encounter: Payer: Self-pay | Admitting: Pulmonary Disease

## 2016-07-27 ENCOUNTER — Other Ambulatory Visit (HOSPITAL_BASED_OUTPATIENT_CLINIC_OR_DEPARTMENT_OTHER): Payer: Medicare Other

## 2016-07-27 DIAGNOSIS — D509 Iron deficiency anemia, unspecified: Secondary | ICD-10-CM

## 2016-07-27 LAB — CBC WITH DIFFERENTIAL/PLATELET
BASO%: 1 % (ref 0.0–2.0)
Basophils Absolute: 0 10*3/uL (ref 0.0–0.1)
EOS ABS: 0.2 10*3/uL (ref 0.0–0.5)
EOS%: 4.1 % (ref 0.0–7.0)
HCT: 34 % — ABNORMAL LOW (ref 38.4–49.9)
HGB: 10.6 g/dL — ABNORMAL LOW (ref 13.0–17.1)
LYMPH%: 30 % (ref 14.0–49.0)
MCH: 19.7 pg — ABNORMAL LOW (ref 27.2–33.4)
MCHC: 31.2 g/dL — AB (ref 32.0–36.0)
MCV: 63.3 fL — ABNORMAL LOW (ref 79.3–98.0)
MONO#: 0.6 10*3/uL (ref 0.1–0.9)
MONO%: 11.8 % (ref 0.0–14.0)
NEUT%: 53.1 % (ref 39.0–75.0)
NEUTROS ABS: 2.5 10*3/uL (ref 1.5–6.5)
Platelets: 167 10*3/uL (ref 140–400)
RBC: 5.37 10*6/uL (ref 4.20–5.82)
RDW: 15.4 % — ABNORMAL HIGH (ref 11.0–14.6)
WBC: 4.8 10*3/uL (ref 4.0–10.3)
lymph#: 1.4 10*3/uL (ref 0.9–3.3)

## 2016-07-27 LAB — TECHNOLOGIST REVIEW

## 2016-07-27 LAB — IRON AND TIBC
%SAT: 23 % (ref 20–55)
Iron: 92 ug/dL (ref 42–163)
TIBC: 395 ug/dL (ref 202–409)
UIBC: 303 ug/dL (ref 117–376)

## 2016-07-27 LAB — FERRITIN: FERRITIN: 22 ng/mL (ref 22–316)

## 2016-08-03 ENCOUNTER — Encounter: Payer: Self-pay | Admitting: Internal Medicine

## 2016-08-03 ENCOUNTER — Telehealth: Payer: Self-pay | Admitting: Internal Medicine

## 2016-08-03 ENCOUNTER — Ambulatory Visit (HOSPITAL_BASED_OUTPATIENT_CLINIC_OR_DEPARTMENT_OTHER): Payer: Medicare Other | Admitting: Internal Medicine

## 2016-08-03 VITALS — BP 129/80 | HR 90 | Temp 98.7°F | Resp 18 | Ht 67.0 in | Wt 241.7 lb

## 2016-08-03 DIAGNOSIS — D509 Iron deficiency anemia, unspecified: Secondary | ICD-10-CM

## 2016-08-03 DIAGNOSIS — D561 Beta thalassemia: Secondary | ICD-10-CM

## 2016-08-03 DIAGNOSIS — D568 Other thalassemias: Secondary | ICD-10-CM

## 2016-08-03 DIAGNOSIS — D508 Other iron deficiency anemias: Secondary | ICD-10-CM

## 2016-08-03 NOTE — Progress Notes (Signed)
[facility] [phone]   [phone]  OFFICE PROGRESS NOTE  [name], [name], MD [place] [address]  DIAGNOSIS: History of beta thalassemia minor and iron deficiency anemia  PRIOR THERAPY: Status post Feraheme infusion 510 mg IV last dose was given on 03/01/2015.  CURRENT THERAPY: Integra plus 1 capsule by mouth daily.  INTERVAL HISTORY: Jacob [name] 82 y.o. male returns to the clinic today for follow-up visit. The patient is feeling fine with no specific complaints. He denied having any fatigue or weakness. He has normal activity with no limitation. He is currently on Integra +1 capsule by mouth daily and tolerating it well. He denied having any recent weight loss or night sweats. He has no nausea, vomiting, diarrhea or constipation. He has no chest pain, shortness of breath, cough or hemoptysis. He is here today for evaluation and repeat blood work.   MEDICAL HISTORY: Past Medical History:  Diagnosis Date  . Anemia   . Arthritis    "middle finger both hands" (09/05/2014)  . Basal cell carcinoma   . Benign prostatic hypertrophy    hx of  . Colon polyp 2006, 2010   TUBULAR ADENOMA  . Diverticulosis of colon (without mention of hemorrhage)   . First degree atrioventricular block   . GERD (gastroesophageal reflux disease)   . Gout   . History of blood transfusion 12/1953; 03/1954   Transfused 7 pints blood '55, 4 pints '56  . History of duodenal ulcer 1955   w/hemorrhage  . Kidney stones    "passed them"  . Morbid obesity ([place])    "I was 294; lost 100# 2014-2015"  . MVP (mitral valve prolapse)   . OSA (obstructive sleep apnea)    "stopped wearing mask after I lost 100#"  . Osteoarthritis   . Other thalassemia ([employer])   . Pneumonia "a number of times"  . Presence of permanent cardiac pacemaker   . Prostate cancer ([place])     ALLERGIES:  is allergic to prednisone and sulfonamide derivatives.  MEDICATIONS:   Current Outpatient Prescriptions  Medication Sig Dispense Refill  . acetaminophen (TYLENOL) 500 MG tablet Take 500 mg by mouth every 6 (six) hours as needed (pain).     Marland Kitchen allopurinol (ZYLOPRIM) 300 MG tablet Take 1 tablet (300 mg total) by mouth daily. 90 tablet 3  . Ascorbic Acid (VITAMIN C PO) Take 1 capsule by mouth daily as needed (supplement).    Marland Kitchen b complex vitamins capsule Take 1 capsule by mouth daily.    . Cholecalciferol (VITAMIN D3) 2000 UNITS capsule Take 1 capsule (2,000 Units total) by mouth daily.    . Coenzyme Q10 300 MG CAPS Take 300 mg by mouth daily.    . colchicine 0.6 MG tablet Take 0.6 mg by mouth daily as needed (gout).     . Cyanocobalamin (VITAMIN B 12 PO) Take 1,000 mg by mouth daily.     Marland Kitchen FeFum-FePoly-FA-B Cmp-C-Biot (INTEGRA PLUS) CAPS TAKE 1 CAPSULE BY MOUTH EVERY MORNING 90 capsule 0  . fexofenadine (ALLEGRA) 180 MG tablet Take 180 mg by mouth daily.     . furosemide (LASIX) 40 MG tablet TAKE 1 TABLET(40 MG) BY MOUTH DAILY 30 tablet 9  . ibuprofen (ADVIL,MOTRIN) 200 MG tablet Take 200 mg by mouth every 6 (six) hours as needed for pain.    . mupirocin ointment (BACTROBAN) 2 % Apply 1 application topically 3 (three) times daily. 22 g 1  .  Omega-3 Fatty Acids (FISH OIL) 600 MG CAPS Take 600 mg by mouth daily.     . tamsulosin (FLOMAX) 0.4 MG CAPS capsule Take 0.4 mg by mouth daily.  11  . XARELTO 20 MG TABS tablet TAKE 1 TABLET(20 MG) BY MOUTH DAILY WITH SUPPER 30 tablet 8   No current facility-administered medications for this visit.     SURGICAL HISTORY:  Past Surgical History:  Procedure Laterality Date  . CATARACT EXTRACTION W/ INTRAOCULAR LENS  IMPLANT, BILATERAL  2002-2004  . CLOSED REDUCTION ANKLE DISLOCATION Right 1980's   "toe my achilles"  . COLONOSCOPY  2015   multiple   . EP IMPLANTABLE DEVICE N/A 09/05/2014   Procedure: Pacemaker Implant;  Surgeon: [name], MD;  Location: [facility];  Service: Cardiovascular;  Laterality: N/A;    . ESOPHAGOGASTRODUODENOSCOPY  2011   multiple  . GASTRECTOMY  03/1954   Transfused 7 pints blood '55, 4 pints '56  . GASTRECTOMY  1956   partial  . INSERT / REPLACE / REMOVE PACEMAKER  09/05/2014  . MOLE REMOVAL Left 2008   "forearm"  . TUMOR REMOVAL Left 2002   from finger- left index    REVIEW OF SYSTEMS:  A comprehensive review of systems was negative.   PHYSICAL EXAMINATION: General appearance: alert, cooperative and no distress Head: Normocephalic, without obvious abnormality, atraumatic Neck: no adenopathy, no JVD, supple, symmetrical, trachea midline and thyroid not enlarged, symmetric, no tenderness/mass/nodules Lymph nodes: Cervical, supraclavicular, and axillary nodes normal. Resp: clear to auscultation bilaterally Back: symmetric, no curvature. ROM normal. No CVA tenderness. Cardio: regular rate and rhythm, S1, S2 normal, no murmur, click, rub or gallop GI: soft, non-tender; bowel sounds normal; no masses,  no organomegaly Extremities: extremities normal, atraumatic, no cyanosis or edema  ECOG PERFORMANCE STATUS: 0 - Asymptomatic  Blood pressure 129/80, pulse 90, temperature 98.7 F (37.1 C), temperature source Oral, resp. rate 18, height 5\' 7"  (1.702 m), weight 241 lb 11.2 oz (109.6 kg), SpO2 96 %.  LABORATORY DATA: Lab Results  Component Value Date   WBC 4.8 07/27/2016   HGB 10.6 (L) 07/27/2016   HCT 34.0 (L) 07/27/2016   MCV 63.3 (L) 07/27/2016   PLT 167 07/27/2016      Chemistry      Component Value Date/Time   NA 138 08/27/2014 0916   NA 141 12/12/2012 1522   K 4.6 08/27/2014 0916   K 4.2 12/12/2012 1522   CL 103 08/27/2014 0916   CO2 27 08/27/2014 0916   CO2 24 12/12/2012 1522   BUN 32 (H) 08/27/2014 0916   BUN 22.4 12/12/2012 1522   CREATININE 0.96 08/27/2014 0916   CREATININE 1.1 12/12/2012 1522      Component Value Date/Time   CALCIUM 9.6 08/27/2014 0916   CALCIUM 9.7 12/12/2012 1522   ALKPHOS 70 05/03/2013 1300   ALKPHOS 86 12/12/2012  1522   AST 26 05/03/2013 1300   AST 21 12/12/2012 1522   ALT 20 05/03/2013 1300   ALT 19 12/12/2012 1522   BILITOT 1.9 (H) 05/03/2013 1300   BILITOT 0.96 12/12/2012 1522     Other lab results: Ferritin 22, serum iron 92, total iron binding capacity 395 and iron saturation 23%.  RADIOGRAPHIC STUDIES: No results found.  ASSESSMENT AND PLAN:  This is a very pleasant 83 years old white male with beta thalassemia minor as well as iron deficiency anemia. He is currently on oral iron tablets with Integra plus 1 capsule by mouth daily. He  is tolerating the treatment well and his energy level is stable. His hemoglobin and hematocrit continues to be below the normal range but his iron study and ferritin are within the acceptable range. I gave the patient the option of proceeding with Feraheme infusion versus continuous treatment with the oral iron tablets for now. He is feeling fine and would like to continue with the oral iron tablets at this point. I will see him back for follow-up visit in 6 months for reevaluation with repeat CBC, iron study and ferritin. He was advised to call sooner if he has increasing fatigue and weakness. The patient voices understanding of current disease status and treatment options and is in agreement with the current care plan. All questions were answered. The patient knows to call the clinic with any problems, questions or concerns. We can certainly see the patient much sooner if necessary.  Disclaimer: This note was dictated with voice recognition software. Similar sounding words can inadvertently be transcribed and may not be corrected upon review.

## 2016-08-03 NOTE — Telephone Encounter (Signed)
Gave patient avs report and appointments for November.  °

## 2016-08-06 ENCOUNTER — Ambulatory Visit (INDEPENDENT_AMBULATORY_CARE_PROVIDER_SITE_OTHER): Payer: Medicare Other | Admitting: Acute Care

## 2016-08-06 ENCOUNTER — Encounter: Payer: Self-pay | Admitting: Acute Care

## 2016-08-06 DIAGNOSIS — Z9989 Dependence on other enabling machines and devices: Secondary | ICD-10-CM

## 2016-08-06 DIAGNOSIS — G4733 Obstructive sleep apnea (adult) (pediatric): Secondary | ICD-10-CM

## 2016-08-06 NOTE — Progress Notes (Signed)
History of Present Illness Jacob [name] is a 83 y.o. male with OSA. He is followed by Dr. Elsworth Soho.   08/06/2016 OSA Follow up: Pt. Presents for follow up. He initiated CPAP therapy 4 weeks ago with a new machine. Settings are AutoSet 10 cm of water to 15 cm water. Patient states he is doing well on his new machine. He states he is sleeping well. He states he does not wake up to the night, and he has less daytime somnolence. He feels he is benefiting from CPAP treatment. Pt states he is losing weight, which may be affecting how his mask fits. He does have a new mask that came with his new machine that he has not tried yet. His plan is to try the new mask. He denies chest pain, fever, orthopnea, or hemoptysis.  Test Results:  [place] 07/07/2016 through 08/05/2016  AutoSet 10 cm of water to 15 cm of water Usage 29 of 30 days or 97%  Greater than 4 hours for 24 days or 80% Less than 4 hours 5 days or 17% Average usage 5 hours daily Median pressure 13 cm of water AHI 17.9  Home sleep test completed 04/09/2016 indicated AHI 49, 114 obstructive apneas, 24 central apneas, 26 mixed apneas, and 94 hypoxic events. Diagnosis was severe obstructive sleep apnea Recommendation was for CPAP therapy and weight loss, with titration study if necessary.  CBC Latest Ref Rng & Units 07/27/2016 01/24/2016 07/26/2015  WBC 4.0 - 10.3 10e3/uL 4.8 7.1 4.7  Hemoglobin 13.0 - 17.1 g/dL 10.6(L) 11.3(L) 11.2(L)  Hematocrit 38.4 - 49.9 % 34.0(L) 36.5(L) 33.8(L)  Platelets 140 - 400 10e3/uL 167 162 117(L)    BMP Latest Ref Rng & Units 08/27/2014 05/25/2014 07/31/2013  Glucose 70 - 99 mg/dL 70 91 91  BUN 6 - 23 mg/dL 32(H) 26(H) 20  Creatinine 0.50 - 1.35 mg/dL 0.96 0.90 1.00  Sodium 135 - 145 mEq/L 138 142 143  Potassium 3.5 - 5.3 mEq/L 4.6 4.0 4.2  Chloride 96 - 112 mEq/L 103 107 104  CO2 19 - 32 mEq/L 27 30 27   Calcium 8.4 - 10.5 mg/dL 9.6 9.1 10.2     Past medical hx Past Medical History:  Diagnosis  Date  . Anemia   . Arthritis    "middle finger both hands" (09/05/2014)  . Basal cell carcinoma   . Benign prostatic hypertrophy    hx of  . Colon polyp 2006, 2010   TUBULAR ADENOMA  . Diverticulosis of colon (without mention of hemorrhage)   . First degree atrioventricular block   . GERD (gastroesophageal reflux disease)   . Gout   . History of blood transfusion 12/1953; 03/1954   Transfused 7 pints blood '55, 4 pints '56  . History of duodenal ulcer 1955   w/hemorrhage  . Kidney stones    "passed them"  . Morbid obesity ([place])    "I was 294; lost 100# 2014-2015"  . MVP (mitral valve prolapse)   . OSA (obstructive sleep apnea)    "stopped wearing mask after I lost 100#"  . Osteoarthritis   . Other thalassemia ([place])   . Pneumonia "a number of times"  . Presence of permanent cardiac pacemaker   . Prostate cancer [facility])      Social History  Substance Use Topics  . Smoking status: Former Smoker    Packs/day: 3.00    Years: 20.00    Types: Cigarettes    Start date: 01/21/1970  . Smokeless tobacco: Never  Used     Comment: quite smoking cigarettes in 1971  . Alcohol use No     Comment: "no alcohol since 1987"    Tobacco Cessation: Patient is a former smoker. Quit date is 1971,  pack years smoking history is 60 pack years  Past surgical hx, Family hx, Social hx all reviewed.  Current Outpatient Prescriptions on File Prior to Visit  Medication Sig  . acetaminophen (TYLENOL) 500 MG tablet Take 500 mg by mouth every 6 (six) hours as needed (pain).   Marland Kitchen allopurinol (ZYLOPRIM) 300 MG tablet Take 1 tablet (300 mg total) by mouth daily.  . Ascorbic Acid (VITAMIN C PO) Take 1 capsule by mouth daily as needed (supplement).  Marland Kitchen b complex vitamins capsule Take 1 capsule by mouth daily.  . Cholecalciferol (VITAMIN D3) 2000 UNITS capsule Take 1 capsule (2,000 Units total) by mouth daily.  . Coenzyme Q10 300 MG CAPS Take 300 mg by mouth daily.  . colchicine 0.6 MG tablet Take 0.6 mg by  mouth daily as needed (gout).   . Cyanocobalamin (VITAMIN B 12 PO) Take 1,000 mg by mouth daily.   Marland Kitchen FeFum-FePoly-FA-B Cmp-C-Biot (INTEGRA PLUS) CAPS TAKE 1 CAPSULE BY MOUTH EVERY MORNING  . fexofenadine (ALLEGRA) 180 MG tablet Take 180 mg by mouth daily.   . furosemide (LASIX) 40 MG tablet TAKE 1 TABLET(40 MG) BY MOUTH DAILY  . ibuprofen (ADVIL,MOTRIN) 200 MG tablet Take 200 mg by mouth every 6 (six) hours as needed for pain.  . mupirocin ointment (BACTROBAN) 2 % Apply 1 application topically 3 (three) times daily.  . Omega-3 Fatty Acids (FISH OIL) 600 MG CAPS Take 600 mg by mouth daily.   . tamsulosin (FLOMAX) 0.4 MG CAPS capsule Take 0.4 mg by mouth daily.  Alveda Reasons 20 MG TABS tablet TAKE 1 TABLET(20 MG) BY MOUTH DAILY WITH SUPPER   No current facility-administered medications on file prior to visit.      Allergies  Allergen Reactions  . Prednisone     REACTION: GI bleed  . Sulfonamide Derivatives     REACTION: Rash    Review Of Systems:  Constitutional:   No  weight loss, night sweats,  Fevers, chills, fatigue, or  lassitude.  HEENT:   No headaches,  Difficulty swallowing,  Tooth/dental problems, or  Sore throat,                No sneezing, itching, ear ache, nasal congestion, post nasal drip,   CV:  No chest pain,  Orthopnea, PND, swelling in lower extremities, anasarca, dizziness, palpitations, syncope.   GI  No heartburn, indigestion, abdominal pain, nausea, vomiting, diarrhea, change in bowel habits, loss of appetite, bloody stools.   Resp: No shortness of breath with exertion or at rest.  No excess mucus, no productive cough,  No non-productive cough,  No coughing up of blood.  No change in color of mucus.  No wheezing.  No chest wall deformity  Skin: no rash or lesions.  GU: no dysuria, change in color of urine, no urgency or frequency.  No flank pain, no hematuria   MS:  No joint pain or swelling.  No decreased range of motion.  No back pain.  Psych:  No change in  mood or affect. No depression or anxiety.  No memory loss.   Vital Signs BP 118/60 (BP Location: Left Arm, Cuff Size: Normal)   Pulse 64   Ht 5\' 7"  (1.702 m)   Wt 237 lb (107.5 kg)  SpO2 95%   BMI 37.12 kg/m    Physical Exam:  General- No distress,  A&Ox3, pleasant ENT: No sinus tenderness, TM clear, pale nasal mucosa, no oral exudate,no post nasal drip, no LAN Cardiac: S1, S2, regular rate and rhythm, no murmur Chest: No wheeze/ rales/ dullness; no accessory muscle use, no nasal flaring, no sternal retractions Abd.: Soft Non-tender, nondistended, bowel sounds positive, obese Ext: No clubbing cyanosis, trace edema Neuro:  normal strength Skin: No rashes, warm and dry Psych: normal mood and behavior   Assessment/Plan  OSA on CPAP Patient states he has a therapeutic benefit from CPAP. Continued AHI of 17.9 Patient currently not using new mask Plan Please start using your new mask. Continue on CPAP at bedtime. You appear to be benefiting from the treatment Goal is to wear for at least 4-6 hours each night for maximal clinical benefit. Continue to work on weight loss, as the link between excess weight  and sleep apnea is well established.  Do not drive if sleepy. We will do a down Load in 3 months to evaluate AHI. If it is not down trending, we will need to schedule a titration study. Follow up with Sarah or Dr. Elsworth Soho  In 6 months  or before as needed.  Please contact office for sooner follow up if symptoms do not improve or worsen or seek emergency care      Magdalen Spatz, NP 08/06/2016  3:05 PM

## 2016-08-06 NOTE — Patient Instructions (Signed)
It is nice to meet you. Please start using your new mask. Continue on CPAP at bedtime. You appear to be benefiting from the treatment Goal is to wear for at least 4-6 hours each night for maximal clinical benefit. Continue to work on weight loss, as the link between excess weight  and sleep apnea is well established.  Do not drive if sleepy. We will do a down Load in 3 months to evaluate AHI. If it is not down trending, we will need to schedule a titration study. Follow up with Harrel Ferrone or Dr. Elsworth Soho  In 6 months  or before as needed.

## 2016-08-06 NOTE — Assessment & Plan Note (Addendum)
Patient states he has a therapeutic benefit from CPAP. Continued AHI of 17.9 Patient currently not using new mask Plan Please start using your new mask. Continue on CPAP at bedtime. You appear to be benefiting from the treatment Goal is to wear for at least 4-6 hours each night for maximal clinical benefit. Continue to work on weight loss, as the link between excess weight  and sleep apnea is well established.  Do not drive if sleepy. We will do a down Load in 3 months to evaluate AHI. If it is not down trending, we will need to schedule a titration study. Follow up with Ellisa Devivo or Dr. Elsworth Soho  In 6 months  or before as needed.  Please contact office for sooner follow up if symptoms do not improve or worsen or seek emergency care

## 2016-08-08 ENCOUNTER — Emergency Department (HOSPITAL_BASED_OUTPATIENT_CLINIC_OR_DEPARTMENT_OTHER)
Admission: EM | Admit: 2016-08-08 | Discharge: 2016-08-08 | Disposition: A | Discharge status: Home / Self Care | Payer: Medicare Other | Attending: Emergency Medicine | Admitting: Emergency Medicine

## 2016-08-08 ENCOUNTER — Encounter (HOSPITAL_BASED_OUTPATIENT_CLINIC_OR_DEPARTMENT_OTHER): Payer: Self-pay | Admitting: Emergency Medicine

## 2016-08-08 DIAGNOSIS — I10 Essential (primary) hypertension: Secondary | ICD-10-CM | POA: Diagnosis not present

## 2016-08-08 DIAGNOSIS — Z8546 Personal history of malignant neoplasm of prostate: Secondary | ICD-10-CM | POA: Diagnosis not present

## 2016-08-08 DIAGNOSIS — Z4800 Encounter for change or removal of nonsurgical wound dressing: Secondary | ICD-10-CM | POA: Insufficient documentation

## 2016-08-08 DIAGNOSIS — Z87891 Personal history of nicotine dependence: Secondary | ICD-10-CM | POA: Diagnosis not present

## 2016-08-08 DIAGNOSIS — Z85828 Personal history of other malignant neoplasm of skin: Secondary | ICD-10-CM | POA: Insufficient documentation

## 2016-08-08 DIAGNOSIS — Z79899 Other long term (current) drug therapy: Secondary | ICD-10-CM | POA: Diagnosis not present

## 2016-08-08 DIAGNOSIS — Z5189 Encounter for other specified aftercare: Secondary | ICD-10-CM

## 2016-08-08 MED ORDER — CEPHALEXIN 500 MG PO CAPS: 500.0000 mg | ORAL_CAPSULE | Freq: Two times a day (BID) | ORAL | 0 refills | Status: DC

## 2016-08-08 NOTE — ED Provider Notes (Signed)
[facility] Provider Note   CSN: [id number] Arrival date & time: 08/08/16  1141     History   Chief Complaint Chief Complaint  Patient presents with  . Wound Check    HPI Jacob [name] is a 83 y.o. male.  The history is provided by the patient. No language interpreter was used.  Wound Check    Jacob [name] is a 83 y.o. male who presents to the Emergency Department complaining of wound check.  He presents for evaluation of a right arm wound. He was mowing the lawn on Monday when he bumped his arm on the mower. He sustained an abrasion at that time. He covered it with a Band-Aid. Since then he has noticed some local erythema around the wound and it felt hot last night. 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 He denies any fevers, chest pain, shortness of breath. He is on Xarelto for atrial fibrillation. Past Medical History:  Diagnosis Date  . Anemia   . Arthritis    "middle finger both hands" (09/05/2014)  . Basal cell carcinoma   . Benign prostatic hypertrophy    hx of  . Colon polyp 2006, 2010   TUBULAR ADENOMA  . Diverticulosis of colon (without mention of hemorrhage)   . First degree atrioventricular block   . GERD (gastroesophageal reflux disease)   . Gout   . History of blood transfusion 12/1953; 03/1954   Transfused 7 pints blood '55, 4 pints '56  . History of duodenal ulcer 1955   w/hemorrhage  . Kidney stones    "passed them"  . Morbid obesity ([place])    "I was 294; lost 100# 2014-2015"  . MVP (mitral valve prolapse)   . OSA (obstructive sleep apnea)    "stopped wearing mask after I lost 100#"  . Osteoarthritis   . Other thalassemia ([place])   . Pneumonia "a number of times"  . Presence of permanent cardiac pacemaker   . Prostate cancer [facility])     Patient Active Problem List   Diagnosis Date Noted  . Atrial fibrillation ([place]) 09/13/2014  . Pacemaker 09/05/2014  . Complete heart block ([place]) 08/07/2014  . Unspecified  constipation 09/05/2013  . Iron deficiency anemia 02/28/2013  . Need for prophylactic vaccination and inoculation against influenza 12/31/2011  . Acute bronchitis 02/13/2011  . Routine health maintenance 01/20/2011  . FLATULENCE-GAS-BLOATING 10/14/2009  . GERD 09/18/2009  . PERSONAL HX COLONIC POLYPS 09/18/2009  . PALPITATIONS 11/20/2008  . MURMUR 11/20/2008  . LABRYNTHITIS 09/26/2008  . HEMATURIA UNSPECIFIED 07/04/2008  . DIVERTICULITIS, HX OF 07/04/2008  . COUGH 05/24/2008  . Gout, unspecified 08/29/2007  . Essential hypertension 08/29/2007  . CHRONIC RHINITIS 08/29/2007  . BENIGN PROSTATIC HYPERTROPHY 08/29/2007  . OSTEOARTHRITIS 08/29/2007  . INTERMITTENT VERTIGO 08/29/2007  . DYSLIPIDEMIA 12/08/2006  . THALASSEMIA NEC 12/08/2006  . Mitral valve disorder 12/08/2006  . BLOCK, AV, 1ST DEGREE 12/08/2006  . PEPTIC ULCER DISEASE 12/08/2006  . DIVERTICULOSIS, COLON 12/08/2006  . OSA on CPAP 12/08/2006  . CATARACT, HX OF 12/08/2006  . NEPHROLITHIASIS, HX OF 12/08/2006  . MORBID OBESITY, HX OF 12/08/2006  . GASTRECTOMY, HX OF 12/08/2006  . CATARACT EXTRACTION, HX OF 12/08/2006    Past Surgical History:  Procedure Laterality Date  . CATARACT EXTRACTION W/ INTRAOCULAR LENS  IMPLANT, BILATERAL  2002-2004  . CLOSED REDUCTION ANKLE DISLOCATION Right 1980's   "toe my achilles"  . COLONOSCOPY  2015   multiple   . EP IMPLANTABLE DEVICE  N/A 09/05/2014   Procedure: Pacemaker Implant;  Surgeon: [name], MD;  Location: [facility];  Service: Cardiovascular;  Laterality: N/A;  . ESOPHAGOGASTRODUODENOSCOPY  2011   multiple  . GASTRECTOMY  03/1954   Transfused 7 pints blood '55, 4 pints '56  . GASTRECTOMY  1956   partial  . INSERT / REPLACE / REMOVE PACEMAKER  09/05/2014  . MOLE REMOVAL Left 2008   "forearm"  . TUMOR REMOVAL Left 2002   from finger- left index       Home Medications    Prior to Admission medications   Medication Sig Start Date End Date Taking?  Authorizing Provider  acetaminophen (TYLENOL) 500 MG tablet Take 500 mg by mouth every 6 (six) hours as needed (pain).     [provider]  allopurinol (ZYLOPRIM) 300 MG tablet Take 1 tablet (300 mg total) by mouth daily. 05/04/13   Norins, Heinz Knuckles, MD  Ascorbic Acid (VITAMIN C PO) Take 1 capsule by mouth daily as needed (supplement).    [provider]  b complex vitamins capsule Take 1 capsule by mouth daily.    [provider]  cephALEXin (KEFLEX) 500 MG capsule Take 1 capsule (500 mg total) by mouth 2 (two) times daily. 08/08/16   Quintella Reichert, MD  Cholecalciferol (VITAMIN D3) 2000 UNITS capsule Take 1 capsule (2,000 Units total) by mouth daily. 11/02/12   Norins, Heinz Knuckles, MD  Coenzyme Q10 300 MG CAPS Take 300 mg by mouth daily.    [provider]  colchicine 0.6 MG tablet Take 0.6 mg by mouth daily as needed (gout).  05/08/13   Norins, Heinz Knuckles, MD  Cyanocobalamin (VITAMIN B 12 PO) Take 1,000 mg by mouth daily.     [provider]  FeFum-FePoly-FA-B Cmp-C-Biot (INTEGRA PLUS) CAPS TAKE 1 CAPSULE BY MOUTH EVERY MORNING 06/22/16   Curt Bears, MD  fexofenadine (ALLEGRA) 180 MG tablet Take 180 mg by mouth daily.     [provider]  furosemide (LASIX) 40 MG tablet TAKE 1 TABLET(40 MG) BY MOUTH DAILY 04/15/16   [name], MD  ibuprofen (ADVIL,MOTRIN) 200 MG tablet Take 200 mg by mouth every 6 (six) hours as needed for pain.    [provider]  mupirocin ointment (BACTROBAN) 2 % Apply 1 application topically 3 (three) times daily. 02/07/16   Wendie Agreste, MD  Omega-3 Fatty Acids (FISH OIL) 600 MG CAPS Take 600 mg by mouth daily.     [provider]  tamsulosin (FLOMAX) 0.4 MG CAPS capsule Take 0.4 mg by mouth daily. 11/13/13   [provider]  XARELTO 20 MG TABS tablet TAKE 1 TABLET(20 MG) BY MOUTH DAILY WITH SUPPER 12/02/15   [name], MD    Family History Family History  Problem Relation Age  of Onset  . Coronary artery disease Father   . Heart attack Father   . Gout Father   . Cancer Daughter        skin  . Colon cancer Neg Hx     Social History Social History  Substance Use Topics  . Smoking status: Former Smoker    Packs/day: 3.00    Years: 20.00    Types: Cigarettes    Start date: 01/21/1970  . Smokeless tobacco: Never Used     Comment: quite smoking cigarettes in 1971  . Alcohol use No     Comment: "no alcohol since 1987"     Allergies   Prednisone and Sulfonamide  derivatives   Review of Systems Review of Systems  All other systems reviewed and are negative.    Physical Exam Updated Vital Signs BP 136/84 (BP Location: Left Arm)   Pulse 80   Temp 98.1 F (36.7 C) (Oral)   Resp 16   Ht 5\' 7"  (1.702 m)   Wt 107.5 kg (237 lb)   SpO2 98%   BMI 37.12 kg/m   Physical Exam  Constitutional: He is oriented to person, place, and time. He appears well-developed and well-nourished. No distress.  HENT:  Head: Normocephalic and atraumatic.  Cardiovascular: Normal rate.   Musculoskeletal:  2+ radial pulses bilaterally. There is a scab over the right dorsal forearm that is approximately half a centimeter in diameter. There is about 1-2 cm of surrounding erythema without significant induration or tenderness. No fluctuance. Full range of motion is intact in the right wrist and elbow.  Neurological: He is alert and oriented to person, place, and time.  Skin: Skin is warm and dry. Capillary refill takes less than 2 seconds.  Psychiatric: He has a normal mood and affect.  Nursing note and vitals reviewed.    ED Treatments / Results  Labs (all labs ordered are listed, but only abnormal results are displayed) Labs Reviewed - No data to display  EKG  EKG Interpretation None       Radiology No results found.  Procedures Procedures (including critical care time)  Medications Ordered in ED Medications - No data to display   Initial Impression /  Assessment and Plan / ED Course  I have reviewed the triage vital signs and the nursing notes.  Pertinent labs & imaging results that were available during my care of the patient were reviewed by me and considered in my medical decision making (see chart for details).     Patient here for wound check to the right arm after scratching it on a lawnmower. He is neurovascularly intact on his right upper extremity exam. There is minimal erythema, question healing tissue versus possible very early cellulitis. Discussed with patient watching wound for now and if there is any extension of the redness or pain to initiate the antibiotics. Discussed return precautions for evidence of significant worsening symptoms.  Final Clinical Impressions(s) / ED Diagnoses   Final diagnoses:  Visit for wound check    New Prescriptions New Prescriptions   CEPHALEXIN (KEFLEX) 500 MG CAPSULE    Take 1 capsule (500 mg total) by mouth 2 (two) times daily.     Quintella Reichert, MD 08/08/16 [phone]

## 2016-08-08 NOTE — ED Triage Notes (Signed)
Pt scratched his R forearm on the lawn mower on Monday, states the area is red now and hot to touch.

## 2016-08-13 NOTE — Progress Notes (Signed)
Reviewed & agree with plan  

## 2016-09-03 ENCOUNTER — Other Ambulatory Visit: Payer: Self-pay | Admitting: Internal Medicine

## 2016-09-04 ENCOUNTER — Encounter: Payer: Self-pay | Admitting: Psychology

## 2016-09-04 NOTE — Telephone Encounter (Signed)
Called spoke with pt, advised we needed more recent lab work on pt to check kidney function, in order to make sure pt was on appropriate dosage of Xarelto.  Pt states he recently has a CPX with Dr Osborne Casco in June of this year and had CMP and CBC done at that time.  Pt had copy of labwork Creat 1.0 per pt report.  Will call [name]'s office to request copy for lab work to be scanned into pt's chart.

## 2016-09-04 NOTE — Telephone Encounter (Signed)
Labs received from [facility] 1.0 and [facility] 12.5 on 08/20/16. CrCl 88.09, based on CrCl pt is on appropriate dosage of Xarelto 20mg  QD.  Will refill rx.

## 2016-09-04 NOTE — Telephone Encounter (Signed)
Called Dr Loren Racer office requested labs be sent to our office.

## 2016-09-04 NOTE — Telephone Encounter (Signed)
Pt last saw Dr Percival Spanish on 03/05/16, last labs 08/27/14 Creat 0.96, pt overdue for follow-up lab work.  Weight 107.5kg, Age 83, before we can refill rx and make sure pt is on appropriate dosage of Xarelto we need a recent BMP, last CBC done 07/27/16 by oncology.

## 2016-09-07 ENCOUNTER — Ambulatory Visit (HOSPITAL_COMMUNITY): Payer: Medicare Other | Attending: Internal Medicine

## 2016-09-07 DIAGNOSIS — I10 Essential (primary) hypertension: Secondary | ICD-10-CM | POA: Diagnosis not present

## 2016-09-07 DIAGNOSIS — I340 Nonrheumatic mitral (valve) insufficiency: Secondary | ICD-10-CM

## 2016-09-07 DIAGNOSIS — I480 Paroxysmal atrial fibrillation: Secondary | ICD-10-CM | POA: Insufficient documentation

## 2016-09-07 DIAGNOSIS — I081 Rheumatic disorders of both mitral and tricuspid valves: Secondary | ICD-10-CM | POA: Diagnosis not present

## 2016-09-07 DIAGNOSIS — G4733 Obstructive sleep apnea (adult) (pediatric): Secondary | ICD-10-CM | POA: Diagnosis not present

## 2016-09-07 DIAGNOSIS — I059 Rheumatic mitral valve disease, unspecified: Secondary | ICD-10-CM

## 2016-09-10 ENCOUNTER — Ambulatory Visit (INDEPENDENT_AMBULATORY_CARE_PROVIDER_SITE_OTHER): Payer: Medicare Other | Admitting: Internal Medicine

## 2016-09-10 ENCOUNTER — Encounter: Payer: Self-pay | Admitting: Internal Medicine

## 2016-09-10 VITALS — BP 126/80 | HR 69 | Ht 67.0 in | Wt 247.0 lb

## 2016-09-10 DIAGNOSIS — I442 Atrioventricular block, complete: Secondary | ICD-10-CM

## 2016-09-10 DIAGNOSIS — I480 Paroxysmal atrial fibrillation: Secondary | ICD-10-CM

## 2016-09-10 DIAGNOSIS — Z950 Presence of cardiac pacemaker: Secondary | ICD-10-CM

## 2016-09-10 NOTE — Progress Notes (Signed)
HPI Mr. Bamber returns today for followup. He is a 83 yo man who underwent PPM insertion several years ago. He has not had syncope. No sob. No fever or chills. He has gained 47 lbs in the past 3 years. He admits to dietary indiscretion. He does not feel his atrial fib.  Allergies  Allergen Reactions  . Prednisone     REACTION: GI bleed  . Sulfonamide Derivatives     REACTION: Rash     Current Outpatient Prescriptions  Medication Sig Dispense Refill  . acetaminophen (TYLENOL) 500 MG tablet Take 500 mg by mouth every 6 (six) hours as needed (pain).     Marland Kitchen allopurinol (ZYLOPRIM) 300 MG tablet Take 1 tablet (300 mg total) by mouth daily. 90 tablet 3  . Ascorbic Acid (VITAMIN C PO) Take 1 capsule by mouth daily as needed (supplement).    Marland Kitchen b complex vitamins capsule Take 1 capsule by mouth daily.    . Cholecalciferol (VITAMIN D3) 2000 UNITS capsule Take 1 capsule (2,000 Units total) by mouth daily.    . Coenzyme Q10 300 MG CAPS Take 300 mg by mouth daily.    . colchicine 0.6 MG tablet Take 0.6 mg by mouth daily as needed (gout).     . Cyanocobalamin (VITAMIN B 12 PO) Take 1,000 mg by mouth daily.     Marland Kitchen FeFum-FePoly-FA-B Cmp-C-Biot (INTEGRA PLUS) CAPS TAKE 1 CAPSULE BY MOUTH EVERY MORNING 90 capsule 0  . fexofenadine (ALLEGRA) 180 MG tablet Take 180 mg by mouth daily.     . furosemide (LASIX) 40 MG tablet TAKE 1 TABLET(40 MG) BY MOUTH DAILY 30 tablet 9  . Omega-3 Fatty Acids (FISH OIL) 600 MG CAPS Take 600 mg by mouth daily.     . tamsulosin (FLOMAX) 0.4 MG CAPS capsule Take 0.4 mg by mouth daily.  11  . XARELTO 20 MG TABS tablet TAKE 1 TABLET(20 MG) BY MOUTH DAILY WITH SUPPER 30 tablet 6   No current facility-administered medications for this visit.      Past Medical History:  Diagnosis Date  . Anemia   . Arthritis    "middle finger both hands" (09/05/2014)  . Basal cell carcinoma   . Benign prostatic hypertrophy    hx of  . Colon polyp 2006, 2010   TUBULAR ADENOMA  .  Diverticulosis of colon (without mention of hemorrhage)   . First degree atrioventricular block   . GERD (gastroesophageal reflux disease)   . Gout   . History of blood transfusion 12/1953; 03/1954   Transfused 7 pints blood '55, 4 pints '56  . History of duodenal ulcer 1955   w/hemorrhage  . Kidney stones    "passed them"  . Morbid obesity ([place])    "I was 294; lost 100# 2014-2015"  . MVP (mitral valve prolapse)   . OSA (obstructive sleep apnea)    "stopped wearing mask after I lost 100#"  . Osteoarthritis   . Other thalassemia ([place])   . Pneumonia "a number of times"  . Presence of permanent cardiac pacemaker   . Prostate cancer ([place])     ROS:   All systems reviewed and negative except as noted in the HPI.   Past Surgical History:  Procedure Laterality Date  . CATARACT EXTRACTION W/ INTRAOCULAR LENS  IMPLANT, BILATERAL  2002-2004  . CLOSED REDUCTION ANKLE DISLOCATION Right 1980's   "toe my achilles"  . COLONOSCOPY  2015   multiple   . EP IMPLANTABLE DEVICE N/A  09/05/2014   Procedure: Pacemaker Implant;  Surgeon: [name], MD;  Location: [facility];  Service: Cardiovascular;  Laterality: N/A;  . ESOPHAGOGASTRODUODENOSCOPY  2011   multiple  . GASTRECTOMY  03/1954   Transfused 7 pints blood '55, 4 pints '56  . GASTRECTOMY  1956   partial  . INSERT / REPLACE / REMOVE PACEMAKER  09/05/2014  . MOLE REMOVAL Left 2008   "forearm"  . TUMOR REMOVAL Left 2002   from finger- left index     Family History  Problem Relation Age of Onset  . Coronary artery disease Father   . Heart attack Father   . Gout Father   . Cancer Daughter        skin  . Colon cancer Neg Hx      Social History   Social History  . Marital status: Married    Spouse name: ANn  . Number of children: 8  . Years of education: 18+   Occupational History  . executive     retired   Social History Main Topics  . Smoking status: Former Smoker    Packs/day: 3.00    Years: 20.00     Types: Cigarettes    Start date: 01/21/1970  . Smokeless tobacco: Never Used     Comment: quite smoking cigarettes in 1971  . Alcohol use No     Comment: "no alcohol since 1987"  . Drug use: No  . Sexual activity: Yes    Partners: Female   Other Topics Concern  . Not on file   Social History Narrative   [employer]; [place]. Married '59. Eight Daughters '60, '62, '66, '70, '73, '74, '78, '83; 19 grandchildren. Lives independently with wife. ACP- asked pt to think about these issues (DEc '12)           BP 126/80   Pulse 69   Ht 5\' 7"  (1.702 m)   Wt 247 lb (112 kg)   SpO2 97%   BMI 38.69 kg/m   Physical Exam:  Well appearing 83 yo man, NAD HEENT: Unremarkable Neck:  7 cm JVD, no thyromegally Back:  No CVA tenderness Lungs:  Clear with no wheezes HEART:  Regular rate rhythm, no murmurs, no rubs, no clicks Abd:  soft, positive bowel sounds, no organomegally, no rebound, no guarding Ext:  2 plus pulses, no edema, no cyanosis, no clubbing Skin:  No rashes no nodules Neuro:  CN II through XII intact, motor grossly intact  DEVICE  Normal device function.  See PaceArt for details.   A/P 1. Atrial fib - he is maintaining NSR much of the time. He will continue his current meds. 2. HTN - his blood pressure has been well controlled. He is encouraged to lose weight and reduce his salt intake. He admits to dietary indiscretion. 3. PPM - his Medtronic DDD PM is working normally. He has been reprogrammed to DDDR 60/min.  Mikle Bosworth.D.

## 2016-09-10 NOTE — Patient Instructions (Signed)
Medication Instructions:  Your physician recommends that you continue on your current medications as directed. Please refer to the Current Medication list given to you today.   Labwork: None Ordered   Testing/Procedures: None Ordered   Follow-Up: Your physician wants you to follow-up in: 1 year with Dr. Lovena Le. You will receive a reminder letter in the mail two months in advance. If you don't receive a letter, please call our office to schedule the follow-up appointment.  Remote monitoring is used to monitor your Pacemaker from home. This monitoring reduces the number of office visits required to check your device to one time per year. It allows [place] to keep an eye on the functioning of your device to ensure it is working properly. You are scheduled for a device check from home on  12/10/16 . You may send your transmission at any time that day. If you have a wireless device, the transmission will be sent automatically. After your physician reviews your transmission, you will receive a postcard with your next transmission date.    Any Other Special Instructions Will Be Listed Below (If Applicable).     If you need a refill on your cardiac medications before your next appointment, please call your pharmacy.

## 2016-09-13 ENCOUNTER — Encounter (HOSPITAL_BASED_OUTPATIENT_CLINIC_OR_DEPARTMENT_OTHER): Payer: Self-pay | Admitting: *Deleted

## 2016-09-13 ENCOUNTER — Emergency Department (HOSPITAL_BASED_OUTPATIENT_CLINIC_OR_DEPARTMENT_OTHER)
Admission: EM | Admit: 2016-09-13 | Discharge: 2016-09-13 | Disposition: A | Discharge status: Home / Self Care | Payer: Medicare Other | Attending: Emergency Medicine | Admitting: Emergency Medicine

## 2016-09-13 DIAGNOSIS — Z7901 Long term (current) use of anticoagulants: Secondary | ICD-10-CM | POA: Insufficient documentation

## 2016-09-13 DIAGNOSIS — Z87891 Personal history of nicotine dependence: Secondary | ICD-10-CM | POA: Diagnosis not present

## 2016-09-13 DIAGNOSIS — Z8546 Personal history of malignant neoplasm of prostate: Secondary | ICD-10-CM | POA: Diagnosis not present

## 2016-09-13 DIAGNOSIS — I341 Nonrheumatic mitral (valve) prolapse: Secondary | ICD-10-CM | POA: Diagnosis not present

## 2016-09-13 DIAGNOSIS — I10 Essential (primary) hypertension: Secondary | ICD-10-CM | POA: Diagnosis not present

## 2016-09-13 DIAGNOSIS — T63461A Toxic effect of venom of wasps, accidental (unintentional), initial encounter: Secondary | ICD-10-CM | POA: Insufficient documentation

## 2016-09-13 DIAGNOSIS — I4891 Unspecified atrial fibrillation: Secondary | ICD-10-CM | POA: Diagnosis not present

## 2016-09-13 DIAGNOSIS — Z950 Presence of cardiac pacemaker: Secondary | ICD-10-CM | POA: Diagnosis not present

## 2016-09-13 DIAGNOSIS — Z79899 Other long term (current) drug therapy: Secondary | ICD-10-CM | POA: Insufficient documentation

## 2016-09-13 DIAGNOSIS — R21 Rash and other nonspecific skin eruption: Secondary | ICD-10-CM | POA: Diagnosis not present

## 2016-09-13 DIAGNOSIS — Z85828 Personal history of other malignant neoplasm of skin: Secondary | ICD-10-CM | POA: Diagnosis not present

## 2016-09-13 MED ORDER — DIPHENHYDRAMINE HCL 25 MG PO CAPS: 25.0000 mg | ORAL_CAPSULE | Freq: Four times a day (QID) | ORAL | 0 refills | Status: DC | PRN

## 2016-09-13 MED ORDER — DIPHENHYDRAMINE HCL 25 MG PO CAPS
25.0000 mg | ORAL_CAPSULE | Freq: Once | ORAL | Status: AC
  Administered 2016-09-13: 25 mg via ORAL
  Filled 2016-09-13: qty 1

## 2016-09-13 MED ORDER — CEPHALEXIN 500 MG PO CAPS: 500.0000 mg | ORAL_CAPSULE | Freq: Four times a day (QID) | ORAL | 0 refills | Status: AC

## 2016-09-13 NOTE — ED Provider Notes (Signed)
[place] DEPT [facility] Provider Note   CSN: [id number] Arrival date & time: 09/13/16  7371     History   Chief Complaint Chief Complaint  Patient presents with  . Insect Bite    HPI Jacob [name] is a 83 y.o. male.  The history is provided by the patient and medical records. No language interpreter was used.  Rash   This is a new problem. The current episode started yesterday. The problem has been gradually worsening. The problem is associated with an insect bite/sting. There has been no fever. The rash is present on the right ear, left upper leg and right upper leg. The pain is at a severity of 2/10. The pain is mild. The pain has been constant since onset. Associated symptoms include pain. He has tried nothing for the symptoms. The treatment provided no relief.    Past Medical History:  Diagnosis Date  . Anemia   . Arthritis    "middle finger both hands" (09/05/2014)  . Basal cell carcinoma   . Benign prostatic hypertrophy    hx of  . Colon polyp 2006, 2010   TUBULAR ADENOMA  . Diverticulosis of colon (without mention of hemorrhage)   . First degree atrioventricular block   . GERD (gastroesophageal reflux disease)   . Gout   . History of blood transfusion 12/1953; 03/1954   Transfused 7 pints blood '55, 4 pints '56  . History of duodenal ulcer 1955   w/hemorrhage  . Kidney stones    "passed them"  . Morbid obesity ([place])    "I was 294; lost 100# 2014-2015"  . MVP (mitral valve prolapse)   . OSA (obstructive sleep apnea)    "stopped wearing mask after I lost 100#"  . Osteoarthritis   . Other thalassemia ([place])   . Pneumonia "a number of times"  . Presence of permanent cardiac pacemaker   . Prostate cancer [facility])     Patient Active Problem List   Diagnosis Date Noted  . Atrial fibrillation ([place]) 09/13/2014  . Pacemaker 09/05/2014  . Complete heart block ([place]) 08/07/2014  . Unspecified constipation 09/05/2013  . Iron deficiency anemia 02/28/2013  . Need  for prophylactic vaccination and inoculation against influenza 12/31/2011  . Acute bronchitis 02/13/2011  . Routine health maintenance 01/20/2011  . FLATULENCE-GAS-BLOATING 10/14/2009  . GERD 09/18/2009  . PERSONAL HX COLONIC POLYPS 09/18/2009  . PALPITATIONS 11/20/2008  . MURMUR 11/20/2008  . LABRYNTHITIS 09/26/2008  . HEMATURIA UNSPECIFIED 07/04/2008  . DIVERTICULITIS, HX OF 07/04/2008  . COUGH 05/24/2008  . Gout, unspecified 08/29/2007  . Essential hypertension 08/29/2007  . CHRONIC RHINITIS 08/29/2007  . BENIGN PROSTATIC HYPERTROPHY 08/29/2007  . OSTEOARTHRITIS 08/29/2007  . INTERMITTENT VERTIGO 08/29/2007  . DYSLIPIDEMIA 12/08/2006  . THALASSEMIA NEC 12/08/2006  . Mitral valve disorder 12/08/2006  . BLOCK, AV, 1ST DEGREE 12/08/2006  . PEPTIC ULCER DISEASE 12/08/2006  . DIVERTICULOSIS, COLON 12/08/2006  . OSA on CPAP 12/08/2006  . CATARACT, HX OF 12/08/2006  . NEPHROLITHIASIS, HX OF 12/08/2006  . MORBID OBESITY, HX OF 12/08/2006  . GASTRECTOMY, HX OF 12/08/2006  . CATARACT EXTRACTION, HX OF 12/08/2006    Past Surgical History:  Procedure Laterality Date  . CATARACT EXTRACTION W/ INTRAOCULAR LENS  IMPLANT, BILATERAL  2002-2004  . CLOSED REDUCTION ANKLE DISLOCATION Right 1980's   "toe my achilles"  . COLONOSCOPY  2015   multiple   . EP IMPLANTABLE DEVICE N/A 09/05/2014   Procedure: Pacemaker Implant;  Surgeon: [name], MD;  Location: [facility]  INVASIVE CV LAB;  Service: Cardiovascular;  Laterality: N/A;  . ESOPHAGOGASTRODUODENOSCOPY  2011   multiple  . GASTRECTOMY  03/1954   Transfused 7 pints blood '55, 4 pints '56  . GASTRECTOMY  1956   partial  . INSERT / REPLACE / REMOVE PACEMAKER  09/05/2014  . MOLE REMOVAL Left 2008   "forearm"  . TUMOR REMOVAL Left 2002   from finger- left index       Home Medications    Prior to Admission medications   Medication Sig Start Date End Date Taking? Authorizing Provider  allopurinol (ZYLOPRIM) 300 MG tablet Take 1 tablet  (300 mg total) by mouth daily. 05/04/13  Yes Norins, Heinz Knuckles, MD  Ascorbic Acid (VITAMIN C PO) Take 1 capsule by mouth daily as needed (supplement).   Yes [provider]  b complex vitamins capsule Take 1 capsule by mouth daily.   Yes [provider]  Cholecalciferol (VITAMIN D3) 2000 UNITS capsule Take 1 capsule (2,000 Units total) by mouth daily. 11/02/12  Yes Norins, Heinz Knuckles, MD  Coenzyme Q10 300 MG CAPS Take 300 mg by mouth daily.   Yes [provider]  colchicine 0.6 MG tablet Take 0.6 mg by mouth daily as needed (gout).  05/08/13  Yes Norins, Heinz Knuckles, MD  Cyanocobalamin (VITAMIN B 12 PO) Take 1,000 mg by mouth daily.    Yes [provider]  FeFum-FePoly-FA-B Cmp-C-Biot (INTEGRA PLUS) CAPS TAKE 1 CAPSULE BY MOUTH EVERY MORNING 06/22/16  Yes Curt Bears, MD  fexofenadine (ALLEGRA) 180 MG tablet Take 180 mg by mouth daily.    Yes [provider]  furosemide (LASIX) 40 MG tablet TAKE 1 TABLET(40 MG) BY MOUTH DAILY 04/15/16  Yes Hochrein, Jeneen Rinks, MD  Omega-3 Fatty Acids (FISH OIL) 600 MG CAPS Take 600 mg by mouth daily.    Yes [provider]  tamsulosin (FLOMAX) 0.4 MG CAPS capsule Take 0.4 mg by mouth daily. 11/13/13  Yes [provider]  XARELTO 20 MG TABS tablet TAKE 1 TABLET(20 MG) BY MOUTH DAILY WITH SUPPER 09/04/16  Yes [name], MD  acetaminophen (TYLENOL) 500 MG tablet Take 500 mg by mouth every 6 (six) hours as needed (pain).     [provider]    Family History Family History  Problem Relation Age of Onset  . Coronary artery disease Father   . Heart attack Father   . Gout Father   . Cancer Daughter        skin  . Colon cancer Neg Hx     Social History Social History  Substance Use Topics  . Smoking status: Former Smoker    Packs/day: 3.00    Years: 20.00    Types: Cigarettes    Start date: 01/21/1970  . Smokeless tobacco: Never Used     Comment: quite smoking cigarettes in 1971  .  Alcohol use No     Comment: "no alcohol since 1987"     Allergies   Prednisone and Sulfonamide derivatives   Review of Systems Review of Systems  Constitutional: Negative for chills and fever.  HENT: Positive for ear pain. Negative for congestion, ear discharge, facial swelling, sore throat, trouble swallowing and voice change.   Respiratory: Negative for chest tightness, shortness of breath, wheezing and stridor.   Cardiovascular: Negative for chest pain.  Gastrointestinal: Negative for abdominal distention, abdominal pain, nausea and vomiting.  Genitourinary: Negative for dysuria and flank pain.  Musculoskeletal: Negative for back pain and neck pain.  Skin:  Positive for rash. Negative for wound.  Neurological: Negative for light-headedness and headaches.  Psychiatric/Behavioral: Negative for confusion.  All other systems reviewed and are negative.    Physical Exam Updated Vital Signs BP 132/69 (BP Location: Left Arm)   Pulse 78   Temp 97.7 F (36.5 C) (Oral)   Resp 18   Ht 5\' 7"  (1.702 m)   Wt 112 kg (247 lb)   SpO2 98%   BMI 38.69 kg/m   Physical Exam  Constitutional: He is oriented to person, place, and time. He appears well-developed and well-nourished.  HENT:  Right Ear: Hearing and tympanic membrane normal. There is swelling and tenderness. No drainage.  Ears:  Mouth/Throat: Oropharynx is clear and moist. No oropharyngeal exudate.  Eyes: Conjunctivae and EOM are normal. Pupils are equal, round, and reactive to light.  Neck: Normal range of motion. Neck supple.  Cardiovascular: Normal rate, regular rhythm and intact distal pulses.   No murmur heard. Pulmonary/Chest: Effort normal and breath sounds normal. No stridor. No respiratory distress. He has no wheezes. He exhibits no tenderness.  Abdominal: Soft. There is no tenderness.  Musculoskeletal: He exhibits no edema or tenderness.       Legs: Neurological: He is alert and oriented to person, place, and time.  No cranial nerve deficit or sensory deficit. He exhibits normal muscle tone.  Skin: Skin is warm and dry. Capillary refill takes less than 2 seconds. There is erythema. No pallor.  Psychiatric: He has a normal mood and affect.  Nursing note and vitals reviewed.    ED Treatments / Results  Labs (all labs ordered are listed, but only abnormal results are displayed) Labs Reviewed - No data to display  EKG  EKG Interpretation None       Radiology No results found.  Procedures Procedures (including critical care time)  Medications Ordered in ED Medications  diphenhydrAMINE (BENADRYL) capsule 25 mg (25 mg Oral Given 09/13/16 0749)     Initial Impression / Assessment and Plan / ED Course  I have reviewed the triage vital signs and the nursing notes.  Pertinent labs & imaging results that were available during my care of the patient were reviewed by me and considered in my medical decision making (see chart for details).     Jacob [name] is a 83 y.o. male with a past medical history significant for pacemaker dependence on Xarelto therapy, kidney stones, and prostate cancer who presents with insect bites. Patient reports that he was mowing his grass yesterday afternoon when he mowed over a wasp nest. He says he was immediately attacked by wasps and was stung on the left inner thigh, right outer thigh, and right ear. Patient said that shortly after the sting, he got lightheaded and had to lay down but after drinking some fluid and sitting still, his lightheadedness resolved. He denies any sore throat, difficulty breathing, voice change, systemic rash, tongue swelling, lip swelling, nausea, vomiting, or other symptoms. He denies any chest pain or shortness of breath. He has no history of anaphylactic reactions. Patient does say that he had a GI bleed with prednisone use in the past. Patient is on Xarelto.  History and exam are seen above. On exam, patient's right ear is very swollen  on the outer helix. It is nonfluctuant and there does not feel to be a pocket of fluid. Patient's ear canal is unremarkable. Patient has no tenderness in the mastoid area. No other surrounding erythema. Patient's left thigh has a 16 m  area of erythema and tenderness. Right outer thigh has a 4 cm area of erythema. No fluctuance on either location. Normal sensation, strength, and sensation in both extremities. Lungs clear and exam otherwise normal. Next  ON urology was called who recommended demonstration of antibiotics and oral and histamines with ice for the areas of injury. They said they would consider steroids but given his history, they do not feel it is worth the risk of GI bleed.   Patient given dose of oral antihistamines during initial workup.  Otolaryngology was called who recommended initiation of Keflex for antibiotic therapy and infection prevention. They recommended antihistamine use and ice pack use. Due to the patient's history of GI bleed with steroids, they did not feel steroids were necessary given the risks. They agreed with plan for patient to follow-up with PCP in several days as well as strict return precautions.  Patient was informed of recommendations and was given dose of Benadryl. Patient will follow with PCP and will return if symptoms worsen. Patient had no other questions or concerns and was discharged in good condition.    Final Clinical Impressions(s) / ED Diagnoses   Final diagnoses:  Wasp sting, accidental or unintentional, initial encounter    New Prescriptions New Prescriptions   CEPHALEXIN (KEFLEX) 500 MG CAPSULE    Take 1 capsule (500 mg total) by mouth 4 (four) times daily.   DIPHENHYDRAMINE (BENADRYL) 25 MG CAPSULE    Take 1 capsule (25 mg total) by mouth every 6 (six) hours as needed.   Clinical Impression: 1. Wasp sting, accidental or unintentional, initial encounter     Disposition: Discharge  Condition: Good  I have discussed the results, Dx  and Tx plan with the pt(& family if present). He/she/they expressed understanding and agree(s) with the plan. Discharge instructions discussed at great length. Strict return precautions discussed and pt &/or family have verbalized understanding of the instructions. No further questions at time of discharge.    New Prescriptions   CEPHALEXIN (KEFLEX) 500 MG CAPSULE    Take 1 capsule (500 mg total) by mouth 4 (four) times daily.   DIPHENHYDRAMINE (BENADRYL) 25 MG CAPSULE    Take 1 capsule (25 mg total) by mouth every 6 (six) hours as needed.    Follow Up: [name], MD [place] [address] [phone]  Schedule an appointment as soon as possible for a visit    [place] [address] [id number] [place] [address] [place] [phone]  If symptoms worsen     Tegeler, Gwenyth Allegra, MD 09/13/16 1758

## 2016-09-13 NOTE — ED Notes (Signed)
ED Provider at bedside. 

## 2016-09-13 NOTE — ED Triage Notes (Signed)
Pt reports being stung by wasps yesterday (3 stings total noted to R ear, L inner thigh, R lateral thigh). Reports feeling dizzy at the time and falling. Denies LOC, hitting head, n/v. Areas mentioned above are red and swollen.

## 2016-09-13 NOTE — Discharge Instructions (Signed)
Please take the antibiotics for 1 week as directed by the ENT team. Please use ice packs to help the swelling. Please use the Benadryl to help with the allergic reaction. Please watch for signs of worsened reaction and infection. Please follow-up with her primary care physician for reevaluation in the next several days. If any symptoms acutely worsen, please return to the nearest emergency department.

## 2016-09-22 ENCOUNTER — Other Ambulatory Visit: Payer: Self-pay | Admitting: Internal Medicine

## 2016-09-22 DIAGNOSIS — D568 Other thalassemias: Secondary | ICD-10-CM

## 2016-10-04 ENCOUNTER — Emergency Department (HOSPITAL_BASED_OUTPATIENT_CLINIC_OR_DEPARTMENT_OTHER)
Admission: EM | Admit: 2016-10-04 | Discharge: 2016-10-04 | Disposition: A | Discharge status: Home / Self Care | Payer: Medicare Other | Attending: Emergency Medicine | Admitting: Emergency Medicine

## 2016-10-04 ENCOUNTER — Encounter (HOSPITAL_BASED_OUTPATIENT_CLINIC_OR_DEPARTMENT_OTHER): Payer: Self-pay | Admitting: Emergency Medicine

## 2016-10-04 DIAGNOSIS — I10 Essential (primary) hypertension: Secondary | ICD-10-CM | POA: Diagnosis not present

## 2016-10-04 DIAGNOSIS — Z85828 Personal history of other malignant neoplasm of skin: Secondary | ICD-10-CM | POA: Diagnosis not present

## 2016-10-04 DIAGNOSIS — Z79899 Other long term (current) drug therapy: Secondary | ICD-10-CM | POA: Insufficient documentation

## 2016-10-04 DIAGNOSIS — Z8546 Personal history of malignant neoplasm of prostate: Secondary | ICD-10-CM | POA: Diagnosis not present

## 2016-10-04 DIAGNOSIS — L089 Local infection of the skin and subcutaneous tissue, unspecified: Secondary | ICD-10-CM | POA: Diagnosis present

## 2016-10-04 DIAGNOSIS — Z87891 Personal history of nicotine dependence: Secondary | ICD-10-CM | POA: Insufficient documentation

## 2016-10-04 DIAGNOSIS — S51012A Laceration without foreign body of left elbow, initial encounter: Secondary | ICD-10-CM

## 2016-10-04 NOTE — ED Triage Notes (Signed)
Pt reports ongoing redness, swelling and warmth from a skin tear that occurred a month ago. Pt also reports his L arm go caught in the screen door earlier today and that area is red and swollen also.

## 2016-10-04 NOTE — ED Provider Notes (Signed)
[place] DEPT [facility] Provider Note   CSN: [id number] Arrival date & time: 10/04/16  1524     History   Chief Complaint Chief Complaint  Patient presents with  . Recurrent Skin Infections    HPI Jacob [name] is a 83 y.o. male.  HPI Patient presents with skin changes of his left forearm. States yesterday he was at a relative's house and was hit in the left elbow by a screen door. There was a tear. Had some bleeding at the time. States today he looked down and an old scar that he had on his left forearm is now red. It is usually white. No fevers. He does state that that part feels warmer. No numbness or weakness. He is on blood thinners and states that he bleeds easily. Past Medical History:  Diagnosis Date  . Anemia   . Arthritis    "middle finger both hands" (09/05/2014)  . Basal cell carcinoma   . Benign prostatic hypertrophy    hx of  . Colon polyp 2006, 2010   TUBULAR ADENOMA  . Diverticulosis of colon (without mention of hemorrhage)   . First degree atrioventricular block   . GERD (gastroesophageal reflux disease)   . Gout   . History of blood transfusion 12/1953; 03/1954   Transfused 7 pints blood '55, 4 pints '56  . History of duodenal ulcer 1955   w/hemorrhage  . Kidney stones    "passed them"  . Morbid obesity ([place])    "I was 294; lost 100# 2014-2015"  . MVP (mitral valve prolapse)   . OSA (obstructive sleep apnea)    "stopped wearing mask after I lost 100#"  . Osteoarthritis   . Other thalassemia ([place])   . Pneumonia "a number of times"  . Presence of permanent cardiac pacemaker   . Prostate cancer [facility])     Patient Active Problem List   Diagnosis Date Noted  . Atrial fibrillation ([place]) 09/13/2014  . Pacemaker 09/05/2014  . Complete heart block ([place]) 08/07/2014  . Unspecified constipation 09/05/2013  . Iron deficiency anemia 02/28/2013  . Need for prophylactic vaccination and inoculation against influenza 12/31/2011  . Acute bronchitis  02/13/2011  . Routine health maintenance 01/20/2011  . FLATULENCE-GAS-BLOATING 10/14/2009  . GERD 09/18/2009  . PERSONAL HX COLONIC POLYPS 09/18/2009  . PALPITATIONS 11/20/2008  . MURMUR 11/20/2008  . LABRYNTHITIS 09/26/2008  . HEMATURIA UNSPECIFIED 07/04/2008  . DIVERTICULITIS, HX OF 07/04/2008  . COUGH 05/24/2008  . Gout, unspecified 08/29/2007  . Essential hypertension 08/29/2007  . CHRONIC RHINITIS 08/29/2007  . BENIGN PROSTATIC HYPERTROPHY 08/29/2007  . OSTEOARTHRITIS 08/29/2007  . INTERMITTENT VERTIGO 08/29/2007  . DYSLIPIDEMIA 12/08/2006  . THALASSEMIA NEC 12/08/2006  . Mitral valve disorder 12/08/2006  . BLOCK, AV, 1ST DEGREE 12/08/2006  . PEPTIC ULCER DISEASE 12/08/2006  . DIVERTICULOSIS, COLON 12/08/2006  . OSA on CPAP 12/08/2006  . CATARACT, HX OF 12/08/2006  . NEPHROLITHIASIS, HX OF 12/08/2006  . MORBID OBESITY, HX OF 12/08/2006  . GASTRECTOMY, HX OF 12/08/2006  . CATARACT EXTRACTION, HX OF 12/08/2006    Past Surgical History:  Procedure Laterality Date  . CATARACT EXTRACTION W/ INTRAOCULAR LENS  IMPLANT, BILATERAL  2002-2004  . CLOSED REDUCTION ANKLE DISLOCATION Right 1980's   "toe my achilles"  . COLONOSCOPY  2015   multiple   . EP IMPLANTABLE DEVICE N/A 09/05/2014   Procedure: Pacemaker Implant;  Surgeon: [name], MD;  Location: [facility] CV LAB;  Service: Cardiovascular;  Laterality: N/A;  . ESOPHAGOGASTRODUODENOSCOPY  2011   multiple  . GASTRECTOMY  03/1954   Transfused 7 pints blood '55, 4 pints '56  . GASTRECTOMY  1956   partial  . INSERT / REPLACE / REMOVE PACEMAKER  09/05/2014  . MOLE REMOVAL Left 2008   "forearm"  . TUMOR REMOVAL Left 2002   from finger- left index       Home Medications    Prior to Admission medications   Medication Sig Start Date End Date Taking? Authorizing Provider  acetaminophen (TYLENOL) 500 MG tablet Take 500 mg by mouth every 6 (six) hours as needed (pain).     [provider]  allopurinol  (ZYLOPRIM) 300 MG tablet Take 1 tablet (300 mg total) by mouth daily. 05/04/13   Norins, Heinz Knuckles, MD  Ascorbic Acid (VITAMIN C PO) Take 1 capsule by mouth daily as needed (supplement).    [provider]  b complex vitamins capsule Take 1 capsule by mouth daily.    [provider]  Cholecalciferol (VITAMIN D3) 2000 UNITS capsule Take 1 capsule (2,000 Units total) by mouth daily. 11/02/12   Norins, Heinz Knuckles, MD  Coenzyme Q10 300 MG CAPS Take 300 mg by mouth daily.    [provider]  colchicine 0.6 MG tablet Take 0.6 mg by mouth daily as needed (gout).  05/08/13   Norins, Heinz Knuckles, MD  Cyanocobalamin (VITAMIN B 12 PO) Take 1,000 mg by mouth daily.     [provider]  diphenhydrAMINE (BENADRYL) 25 mg capsule Take 1 capsule (25 mg total) by mouth every 6 (six) hours as needed. 09/13/16 09/18/16  Tegeler, Gwenyth Allegra, MD  FeFum-FePoly-FA-B Cmp-C-Biot (INTEGRA PLUS) CAPS TAKE 1 CAPSULE BY MOUTH EVERY MORNING 09/23/16   Curt Bears, MD  fexofenadine (ALLEGRA) 180 MG tablet Take 180 mg by mouth daily.     [provider]  furosemide (LASIX) 40 MG tablet TAKE 1 TABLET(40 MG) BY MOUTH DAILY 04/15/16   [name], MD  Omega-3 Fatty Acids (FISH OIL) 600 MG CAPS Take 600 mg by mouth daily.     [provider]  tamsulosin (FLOMAX) 0.4 MG CAPS capsule Take 0.4 mg by mouth daily. 11/13/13   [provider]  XARELTO 20 MG TABS tablet TAKE 1 TABLET(20 MG) BY MOUTH DAILY WITH SUPPER 09/04/16   [name], MD    Family History Family History  Problem Relation Age of Onset  . Coronary artery disease Father   . Heart attack Father   . Gout Father   . Cancer Daughter        skin  . Colon cancer Neg Hx     Social History Social History  Substance Use Topics  . Smoking status: Former Smoker    Packs/day: 3.00    Years: 20.00    Types: Cigarettes    Start date: 01/21/1970  . Smokeless tobacco: Never Used     Comment: quite smoking  cigarettes in 1971  . Alcohol use No     Comment: "no alcohol since 1987"     Allergies   Prednisone and Sulfonamide derivatives   Review of Systems Review of Systems  Constitutional: Negative for appetite change.  Skin: Positive for color change and wound.  Hematological: Bruises/bleeds easily.     Physical Exam Updated Vital Signs BP 131/63 (BP Location: Left Arm)   Pulse 82   Temp 97.7 F (36.5 C) (Oral)   Resp 20   SpO2 100%   Physical Exam  Constitutional: He appears well-developed. No  distress.  Musculoskeletal: He exhibits no tenderness.  Skin tear and slight bruising to left lateral elbow. Scabbed skin tear is around 1 cm. Further down the forearm there is a U-shaped scar from previous injury. It is now somewhat reddish. No fluctuance. Not blanching. No erythema between the 2 wounds. No ecchymosis between the wounds.  Neurological: He is alert.     ED Treatments / Results  Labs (all labs ordered are listed, but only abnormal results are displayed) Labs Reviewed - No data to display  EKG  EKG Interpretation None       Radiology No results found.  Procedures Procedures (including critical care time)  Medications Ordered in ED Medications - No data to display   Initial Impression / Assessment and Plan / ED Course  I have reviewed the triage vital signs and the nursing notes.  Pertinent labs & imaging results that were available during my care of the patient were reviewed by me and considered in my medical decision making (see chart for details).     *Patient with small skin tear. Also changing colors are. May be bruising. Does not appear to be infection.  Final Clinical Impressions(s) / ED Diagnoses   Final diagnoses:  Skin tear of left elbow without complication, initial encounter    New Prescriptions New Prescriptions   No medications on file     Davonna Belling, MD 10/04/16 1757

## 2016-11-23 ENCOUNTER — Ambulatory Visit (INDEPENDENT_AMBULATORY_CARE_PROVIDER_SITE_OTHER): Payer: Medicare Other | Admitting: Psychology

## 2016-11-23 ENCOUNTER — Encounter: Payer: Self-pay | Admitting: Psychology

## 2016-11-23 DIAGNOSIS — R413 Other amnesia: Secondary | ICD-10-CM

## 2016-11-23 NOTE — Progress Notes (Signed)
   Neuropsychology Note  Jacob [name] came in today for 1 hour of neuropsychological testing with technician, Milana Kidney, BS, under the supervision of Dr. Macarthur Critchley. The patient did not appear overtly distressed by the testing session, per behavioral observation or via self-report to the technician. Rest breaks were offered. Jacob [name] will return within 2 weeks for a feedback session with Dr. Si Raider at which time his test performances, clinical impressions and treatment recommendations will be reviewed in detail. The patient understands he can contact our office should he require our assistance before this time.  Full report to follow.

## 2016-11-23 NOTE — Progress Notes (Signed)
NEUROPSYCHOLOGICAL INTERVIEW (CPT: [id number])  Name: Jacob [name] Date of Birth: May 30, 1933 Date of Interview: 11/23/2016  Reason for Referral:  Jacob [name] is a 83 y.o. male who is referred for neuropsychological evaluation by Dr. Domenick Gong of [place] due to concerns about memory loss. This patient is accompanied in the office by his wife who supplements the history.  History of Presenting Problem:  Jacob [name] reports gradual onset of memory difficulties over the past couple of years. The first symptom he noticed was difficulty with driving directions. He had to call his wife a few times for directions while driving. The patient states that he thinks this was happening because he was distracted by other thoughts and because he grew up in [place] where there was a grid system. He also notes that he has more difficulty completing the [facility] crossword puzzle and he can no longer complete the difficult level sodoku puzzles. Upon direct questioning, he and his wife also report some forgetfulness for recent conversations (he says this is due to not paying attention in the first place), misplacing items (per his wife, he disagrees), forgetting appointments, and forgetting to take medications. His wife feels he is having more trouble staying on task lately. His wife is managing his appointments, and she tries to remember to remind him to take his medications. He independently manages the finances including their investments and he is involved in daily stock trading.  There is a family history of dementia in his mother who died around age 17.  Physically, the patient complains of knee pain and rare balance difficulty. He has sleep apnea and uses his CPAP faithfully. He has occasional sleep difficulty. He is gaining weight and eating more. He was walking 3 miles but hasn't done that lately, although he has been doing a lot of physical labor in the yard trimming  bushes.  With regard to mood, the patient's wife reports he "has his ups and downs, more so in this period of his life". They both report he is more short tempered/irritable. They do not notice any increased anxiety or worrying. The patient states that he is "not doing anything rewarding, like building or creating anything, and there are things we would like to do but can't" due to his wife's physical limitations.   Psychiatric history was denied aside from situational depression when he was let go from his high level position in 1993 and could not find work afterwards.   Social History: Born/Raised: [place], [place] Education: Master's degree Occupational history: Previously worked for [employer] in very high level position but was let go in the early 90s and could not get another job locally (and refused to move out of state because they were raising their family here). 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 Marital history: Married 27 years with 8 daughters and 41 grandchildren Alcohol: None Tobacco: Former smoker SA: None   Medical History: Past Medical History:  Diagnosis Date  . Anemia   . Arthritis    "middle finger both hands" (09/05/2014)  . Basal cell carcinoma   . Benign prostatic hypertrophy    hx of  . Colon polyp 2006, 2010   TUBULAR ADENOMA  . Diverticulosis of colon (without mention of hemorrhage)   . First degree atrioventricular block   . GERD (gastroesophageal reflux disease)   . Gout   . History of blood transfusion 12/1953; 03/1954   Transfused 7 pints blood '55, 4 pints '56  .  History of duodenal ulcer 1955   w/hemorrhage  . Kidney stones    "passed them"  . Morbid obesity ([place])    "I was 89; lost 100# 2014-2015"  . MVP (mitral valve prolapse)   . OSA (obstructive sleep apnea)    "stopped wearing mask after I lost 100#"  . Osteoarthritis   . Other thalassemia ([place])   . Pneumonia "a number of times"  . Presence of permanent  cardiac pacemaker   . Prostate cancer [facility])       Current Medications:  Outpatient Encounter Prescriptions as of 11/23/2016  Medication Sig  . acetaminophen (TYLENOL) 500 MG tablet Take 500 mg by mouth every 6 (six) hours as needed (pain).   Marland Kitchen allopurinol (ZYLOPRIM) 300 MG tablet Take 1 tablet (300 mg total) by mouth daily.  . Ascorbic Acid (VITAMIN C PO) Take 1 capsule by mouth daily as needed (supplement).  Marland Kitchen b complex vitamins capsule Take 1 capsule by mouth daily.  . Cholecalciferol (VITAMIN D3) 2000 UNITS capsule Take 1 capsule (2,000 Units total) by mouth daily.  . Coenzyme Q10 300 MG CAPS Take 300 mg by mouth daily.  . colchicine 0.6 MG tablet Take 0.6 mg by mouth daily as needed (gout).   . Cyanocobalamin (VITAMIN B 12 PO) Take 1,000 mg by mouth daily.   . diphenhydrAMINE (BENADRYL) 25 mg capsule Take 1 capsule (25 mg total) by mouth every 6 (six) hours as needed.  . FeFum-FePoly-FA-B Cmp-C-Biot (INTEGRA PLUS) CAPS TAKE 1 CAPSULE BY MOUTH EVERY MORNING  . fexofenadine (ALLEGRA) 180 MG tablet Take 180 mg by mouth daily.   . furosemide (LASIX) 40 MG tablet TAKE 1 TABLET(40 MG) BY MOUTH DAILY  . Omega-3 Fatty Acids (FISH OIL) 600 MG CAPS Take 600 mg by mouth daily.   . tamsulosin (FLOMAX) 0.4 MG CAPS capsule Take 0.4 mg by mouth daily.  Alveda Reasons 20 MG TABS tablet TAKE 1 TABLET(20 MG) BY MOUTH DAILY WITH SUPPER   No facility-administered encounter medications on file as of 11/23/2016.      Behavioral Observations:   Appearance: Neatly, casually and appropriately dressed and groomed Gait: Ambulated independently, no gross abnormalities observed Speech: Fluent; normal rate, rhythm and volume. No significant word finding difficulty. Thought process: Generally linear, goal directed Affect: Full, euthymic Interpersonal: Pleasant, appropriate   TESTING: There is medical necessity to proceed with neuropsychological assessment as the results will be used to aid in differential  diagnosis and clinical decision-making and to inform specific treatment recommendations. Per the patient, his wife and medical records reviewed, there has been a change in cognitive functioning and a reasonable suspicion of neurocognitive disorder.  Following the clinical interview, the patient completed a full battery of neuropsychological testing with my psychometrician under my supervision.   PLAN: The patient will return to see me for a follow-up session at which time his test performances and my impressions and treatment recommendations will be reviewed in detail.  Full report to follow.

## 2016-11-30 ENCOUNTER — Ambulatory Visit (INDEPENDENT_AMBULATORY_CARE_PROVIDER_SITE_OTHER): Payer: Medicare Other | Admitting: Physician Assistant

## 2016-11-30 ENCOUNTER — Encounter: Payer: Self-pay | Admitting: Physician Assistant

## 2016-11-30 VITALS — BP 131/56 | HR 67 | Temp 97.5°F | Resp 16 | Ht 66.0 in | Wt 251.4 lb

## 2016-11-30 DIAGNOSIS — L03113 Cellulitis of right upper limb: Secondary | ICD-10-CM

## 2016-11-30 MED ORDER — CEPHALEXIN 500 MG PO CAPS: 500.0000 mg | ORAL_CAPSULE | Freq: Four times a day (QID) | ORAL | 0 refills | Status: AC

## 2016-11-30 NOTE — Patient Instructions (Addendum)
Take your antibiotic four times daily for 7 days. Continue taking this even if you start to feel better.  Be sure to leave your wound open to air to help the healing process.  Come back if your symptoms do not improve/worsen.   Thank you for coming in today. I hope you feel we met your needs.  Feel free to call PCP if you have any questions or further requests.  Please consider signing up for MyChart if you do not already have it, as this is a great way to communicate with me.  Best,  Whitney McVey, PA-C  Cephalexin tablets or capsules What is this medicine? CEPHALEXIN (sef a LEX in) is a cephalosporin antibiotic. It is used to treat certain kinds of bacterial infections It will not work for colds, flu, or other viral infections. This medicine may be used for other purposes; ask your health care provider or pharmacist if you have questions. COMMON BRAND NAME(S): Biocef, Daxbia, Keflex, Keftab What should I tell my health care provider before I take this medicine? They need to know if you have any of these conditions: -kidney disease -stomach or intestine problems, especially colitis -an unusual or allergic reaction to cephalexin, other cephalosporins, penicillins, other antibiotics, medicines, foods, dyes or preservatives -pregnant or trying to get pregnant -breast-feeding How should I use this medicine? Take this medicine by mouth with a full glass of water. Follow the directions on the prescription label. This medicine can be taken with or without food. Take your medicine at regular intervals. Do not take your medicine more often than directed. Take all of your medicine as directed even if you think you are better. Do not skip doses or stop your medicine early. Talk to your pediatrician regarding the use of this medicine in children. While this drug may be prescribed for selected conditions, precautions do apply. Overdosage: If you think you have taken too much of this medicine contact a  poison control center or emergency room at once. NOTE: This medicine is only for you. Do not share this medicine with others. What if I miss a dose? If you miss a dose, take it as soon as you can. If it is almost time for your next dose, take only that dose. Do not take double or extra doses. There should be at least 4 to 6 hours between doses. What may interact with this medicine? -probenecid -some other antibiotics This list may not describe all possible interactions. Give your health care provider a list of all the medicines, herbs, non-prescription drugs, or dietary supplements you use. Also tell them if you smoke, drink alcohol, or use illegal drugs. Some items may interact with your medicine. What should I watch for while using this medicine? Tell your doctor or health care professional if your symptoms do not begin to improve in a few days. Do not treat diarrhea with over the counter products. Contact your doctor if you have diarrhea that lasts more than 2 days or if it is severe and watery. If you have diabetes, you may get a false-positive result for sugar in your urine. Check with your doctor or health care professional. What side effects may I notice from receiving this medicine? Side effects that you should report to your doctor or health care professional as soon as possible: -allergic reactions like skin rash, itching or hives, swelling of the face, lips, or tongue -breathing problems -pain or trouble passing urine -redness, blistering, peeling or loosening of the skin, including inside  the mouth -severe or watery diarrhea -unusually weak or tired -yellowing of the eyes, skin Side effects that usually do not require medical attention (report to your doctor or health care professional if they continue or are bothersome): -gas or heartburn -genital or anal irritation -headache -joint or muscle pain -nausea, vomiting This list may not describe all possible side effects. Call  your doctor for medical advice about side effects. You may report side effects to FDA at 1-800-FDA-1088. Where should I keep my medicine? Keep out of the reach of children. Store at room temperature between 59 and 86 degrees F (15 and 30 degrees C). Throw away any unused medicine after the expiration date. NOTE: This sheet is a summary. It may not cover all possible information. If you have questions about this medicine, talk to your doctor, pharmacist, or health care provider.  2018 Elsevier/Gold Standard (2007-06-06 17:09:13)

## 2016-11-30 NOTE — Progress Notes (Signed)
Jacob [name]  MRN: [id number] DOB: [birth year]  PCP: [name], MD  Subjective:  Pt is an 83 year old male who presents to clinic for right wrist injury. He was trimming trees about 5 days ago when he cut his arm. He has been applying Mupirocin ointment. Wound has gotten worse and is now blistered and weeping.  He denies n/t, fever, chills, swelling.   Of note, he is taking Xarelto.   Review of Systems  Constitutional: Negative for chills, diaphoresis and fever.  Skin: Positive for wound.  Neurological: Negative for weakness and numbness.    Patient Active Problem List   Diagnosis Date Noted  . Atrial fibrillation ([place]) 09/13/2014  . Pacemaker 09/05/2014  . Complete heart block ([place]) 08/07/2014  . Unspecified constipation 09/05/2013  . Iron deficiency anemia 02/28/2013  . Need for prophylactic vaccination and inoculation against influenza 12/31/2011  . Acute bronchitis 02/13/2011  . Routine health maintenance 01/20/2011  . FLATULENCE-GAS-BLOATING 10/14/2009  . GERD 09/18/2009  . PERSONAL HX COLONIC POLYPS 09/18/2009  . PALPITATIONS 11/20/2008  . MURMUR 11/20/2008  . LABRYNTHITIS 09/26/2008  . HEMATURIA UNSPECIFIED 07/04/2008  . DIVERTICULITIS, HX OF 07/04/2008  . COUGH 05/24/2008  . Gout, unspecified 08/29/2007  . Essential hypertension 08/29/2007  . CHRONIC RHINITIS 08/29/2007  . BENIGN PROSTATIC HYPERTROPHY 08/29/2007  . OSTEOARTHRITIS 08/29/2007  . INTERMITTENT VERTIGO 08/29/2007  . DYSLIPIDEMIA 12/08/2006  . THALASSEMIA NEC 12/08/2006  . Mitral valve disorder 12/08/2006  . BLOCK, AV, 1ST DEGREE 12/08/2006  . PEPTIC ULCER DISEASE 12/08/2006  . DIVERTICULOSIS, COLON 12/08/2006  . OSA on CPAP 12/08/2006  . CATARACT, HX OF 12/08/2006  . NEPHROLITHIASIS, HX OF 12/08/2006  . MORBID OBESITY, HX OF 12/08/2006  . GASTRECTOMY, HX OF 12/08/2006  . CATARACT EXTRACTION, HX OF 12/08/2006    Current Outpatient Prescriptions on File Prior to Visit    Medication Sig Dispense Refill  . acetaminophen (TYLENOL) 500 MG tablet Take 500 mg by mouth every 6 (six) hours as needed (pain).     Marland Kitchen allopurinol (ZYLOPRIM) 300 MG tablet Take 1 tablet (300 mg total) by mouth daily. 90 tablet 3  . Ascorbic Acid (VITAMIN C PO) Take 1 capsule by mouth daily as needed (supplement).    Marland Kitchen b complex vitamins capsule Take 1 capsule by mouth daily.    . Cholecalciferol (VITAMIN D3) 2000 UNITS capsule Take 1 capsule (2,000 Units total) by mouth daily.    . Coenzyme Q10 300 MG CAPS Take 300 mg by mouth daily.    . colchicine 0.6 MG tablet Take 0.6 mg by mouth daily as needed (gout).     . Cyanocobalamin (VITAMIN B 12 PO) Take 1,000 mg by mouth daily.     Marland Kitchen FeFum-FePoly-FA-B Cmp-C-Biot (INTEGRA PLUS) CAPS TAKE 1 CAPSULE BY MOUTH EVERY MORNING 90 capsule 0  . fexofenadine (ALLEGRA) 180 MG tablet Take 180 mg by mouth daily.     . furosemide (LASIX) 40 MG tablet TAKE 1 TABLET(40 MG) BY MOUTH DAILY 30 tablet 9  . Omega-3 Fatty Acids (FISH OIL) 600 MG CAPS Take 600 mg by mouth daily.     . tamsulosin (FLOMAX) 0.4 MG CAPS capsule Take 0.4 mg by mouth daily.  11  . XARELTO 20 MG TABS tablet TAKE 1 TABLET(20 MG) BY MOUTH DAILY WITH SUPPER 30 tablet 6  . diphenhydrAMINE (BENADRYL) 25 mg capsule Take 1 capsule (25 mg total) by mouth every 6 (six) hours as needed. 20 capsule 0   No current facility-administered medications  on file prior to visit.     Allergies  Allergen Reactions  . Prednisone     REACTION: GI bleed  . Sulfonamide Derivatives     REACTION: Rash     Objective:  BP (!) 131/56   Pulse 67   Temp (!) 97.5 F (36.4 C) (Oral)   Resp 16   Ht 5\' 6"  (1.676 m) Comment: pt refused to remove shoes  Wt 251 lb 6.4 oz (114 kg) Comment: pt refused to remove shoes  SpO2 96%   BMI 40.58 kg/m   Physical Exam  Constitutional: He is oriented to person, place, and time and well-developed, well-nourished, and in no distress. No distress.  Cardiovascular: Normal  rate, regular rhythm and normal heart sounds.   Neurological: He is alert and oriented to person, place, and time. He has normal sensation and normal strength. GCS score is 15.  Skin: Skin is warm and dry.  Erythematous patch with blistering and weeping. Mildly warm to touch. Wound culture taken.   Psychiatric: Mood, memory, affect and judgment normal.  Vitals reviewed.     Assessment and Plan :  1. Cellulitis of right upper extremity - WOUND CULTURE - cephALEXin (KEFLEX) 500 MG capsule; Take 1 capsule (500 mg total) by mouth 4 (four) times daily.  Dispense: 28 capsule; Refill: 0 - Wound dressed. Con't Mupirocin ointment and dressing changes daily. He agrees with plan. RTC if no improvement in 5-7 days  Mercer Pod, PA-C  [facility] [place] 11/30/2016 8:47 AM

## 2016-12-03 NOTE — Progress Notes (Deleted)
NEUROPSYCHOLOGICAL EVALUATION   Name:    Jacob [name]  Date of Birth:   [birth year] Date of Interview:  11/23/2016 Date of Testing:  11/23/2016   Date of Feedback:  ***       Background Information:  Reason for Referral:  Jacob [name] is a 83 y.o. male referred by Dr. Domenick Gong of [place] to assess his current level of cognitive functioning and assist in differential diagnosis. The current evaluation consisted of a review of available medical records, an interview with the patient and his wife, and the completion of a neuropsychological testing battery. Informed consent was obtained.  History of Presenting Problem:  Jacob [name] reports gradual onset of memory difficulties over the past couple of years. The first symptom he noticed was difficulty with driving directions. He had to call his wife a few times for directions while driving. The patient states that he thinks this was happening because he was distracted by other thoughts and because he grew up in [place] where there was a grid system. He also notes that he has more difficulty completing the [facility] crossword puzzle and he can no longer complete the difficult level sodoku puzzles. Upon direct questioning, he and his wife also report some forgetfulness for recent conversations (he says this is due to not paying attention in the first place), misplacing items (per his wife, he disagrees), forgetting appointments, and forgetting to take medications. His wife feels he is having more trouble staying on task lately. His wife is managing his appointments, and she tries to remember to remind him to take his medications. He independently manages the finances including their investments and he is involved in daily stock trading.  There is a family history of dementia in his mother who died around age 6.  Physically, the patient complains of knee pain and rare balance difficulty. He has sleep apnea and uses  his CPAP faithfully. He has occasional sleep difficulty. He is gaining weight and eating more. He was walking 3 miles but hasn't done that lately, although he has been doing a lot of physical labor in the yard trimming bushes.  With regard to mood, the patient's wife reports he "has his ups and downs, more so in this period of his life". They both report he is more short tempered/irritable. They do not notice any increased anxiety or worrying. The patient states that he is "not doing anything rewarding, like building or creating anything, and there are things we would like to do but can't" due to his wife's physical limitations.   Psychiatric history was denied aside from situational depression when he was let go from his high level position in 1993 and could not find work afterwards.  Social History: Born/Raised: [place], [place] Education: Master's degree Occupational history: Previously worked for [employer] in very high level position but was let go in the early 90s and could not get another job locally (and refused to move out of state because they were raising their family here). 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 Marital history: Married 44 years with 8 daughters and 4 grandchildren Alcohol: None Tobacco: Former smoker SA: None  Medical History:  Past Medical History:  Diagnosis Date  . Anemia   . Arthritis    "middle finger both hands" (09/05/2014)  . Basal cell carcinoma   . Benign prostatic hypertrophy    hx of  . Colon polyp 2006, 2010   TUBULAR ADENOMA  .  Diverticulosis of colon (without mention of hemorrhage)   . First degree atrioventricular block   . GERD (gastroesophageal reflux disease)   . Gout   . History of blood transfusion 12/1953; 03/1954   Transfused 7 pints blood '55, 4 pints '56  . History of duodenal ulcer 1955   w/hemorrhage  . Kidney stones    "passed them"  . Morbid obesity ([place])    "I was 89; lost 100# 2014-2015"  .  MVP (mitral valve prolapse)   . OSA (obstructive sleep apnea)    "stopped wearing mask after I lost 100#"  . Osteoarthritis   . Other thalassemia ([place])   . Pneumonia "a number of times"  . Presence of permanent cardiac pacemaker   . Prostate cancer [facility])     Current medications:  Outpatient Encounter Prescriptions as of 12/08/2016  Medication Sig  . acetaminophen (TYLENOL) 500 MG tablet Take 500 mg by mouth every 6 (six) hours as needed (pain).   Marland Kitchen allopurinol (ZYLOPRIM) 300 MG tablet Take 1 tablet (300 mg total) by mouth daily.  . Ascorbic Acid (VITAMIN C PO) Take 1 capsule by mouth daily as needed (supplement).  Marland Kitchen b complex vitamins capsule Take 1 capsule by mouth daily.  . cephALEXin (KEFLEX) 500 MG capsule Take 1 capsule (500 mg total) by mouth 4 (four) times daily.  . Cholecalciferol (VITAMIN D3) 2000 UNITS capsule Take 1 capsule (2,000 Units total) by mouth daily.  . Coenzyme Q10 300 MG CAPS Take 300 mg by mouth daily.  . colchicine 0.6 MG tablet Take 0.6 mg by mouth daily as needed (gout).   . Cyanocobalamin (VITAMIN B 12 PO) Take 1,000 mg by mouth daily.   . diphenhydrAMINE (BENADRYL) 25 mg capsule Take 1 capsule (25 mg total) by mouth every 6 (six) hours as needed.  . FeFum-FePoly-FA-B Cmp-C-Biot (INTEGRA PLUS) CAPS TAKE 1 CAPSULE BY MOUTH EVERY MORNING  . fexofenadine (ALLEGRA) 180 MG tablet Take 180 mg by mouth daily.   . furosemide (LASIX) 40 MG tablet TAKE 1 TABLET(40 MG) BY MOUTH DAILY  . Omega-3 Fatty Acids (FISH OIL) 600 MG CAPS Take 600 mg by mouth daily.   . tamsulosin (FLOMAX) 0.4 MG CAPS capsule Take 0.4 mg by mouth daily.  Alveda Reasons 20 MG TABS tablet TAKE 1 TABLET(20 MG) BY MOUTH DAILY WITH SUPPER   No facility-administered encounter medications on file as of 12/08/2016.      Current Examination:  Behavioral Observations:   Appearance: Neatly, casually and appropriately dressed and groomed Gait: Ambulated independently, no gross abnormalities observed Speech:  Fluent; normal rate, rhythm and volume. No significant word finding difficulty. Thought process: Generally linear, goal directed Affect: Full, euthymic Interpersonal: Pleasant, appropriate Orientation: Oriented to person, place and most aspects of time (one day off on the current date). Accurately named the current President and his predecessor.   Tests Administered: . Test of Premorbid Functioning (TOPF) . Wechsler Adult Intelligence Scale-Fourth Edition (WAIS-IV): Similarities, Matrix Reasoning, Block Design, Coding and Digit Span subtests . Wechsler Memory Scale-Fourth Edition (WMS-IV) Older Adult Version (ages 48-90): Logical Memory I, II and Recognition subtests  . [employer] Verbal Learning Test - 2nd Edition (CVLT-2) Short Form . Repeatable Battery for the Assessment of Neuropsychological Status (RBANS) Form A:  Figure Copy and Recall subtests and Semantic Fluency subtest . Boston Naming Test (BNT) . Boston Diagnostic Aphasia Examination: Complex Ideational Material subtest . Controlled Oral Word Association Test (COWAT) . Trail Making Test A and B . Clock drawing test .  Geriatric Depression Scale (GDS) 15 Item . Generalized Anxiety Disorder - 7 item screener (GAD-7)  Test Results: Note: Standardized scores are presented only for use by appropriately trained professionals and to allow for any future test-retest comparison. These scores should not be interpreted without consideration of all the information that is contained in the rest of the report. The most recent standardization samples from the test publisher or other sources were used whenever possible to derive standard scores; scores were corrected for age, gender, ethnicity and education when available.   Test Scores:  ***  Description of Test Results:  Premorbid verbal intellectual abilities were estimated to have been within the high average range based on a test of word reading. Psychomotor processing speed was high  average to superior. Auditory attention and working memory were very superior to superior, respectively. Visual-spatial construction was high average to average. Language abilities were somewhat variable. Specifically, confrontation naming was average, and semantic verbal fluency ranged from low average (for animals) to impaired (for fruits and vegetables). Auditory comprehension of complex ideational material was intact. With regard to verbal memory, encoding and acquisition of non-contextual information (i.e., word list) was low average. After a brief distracter task, free recall was average (5/9 items recalled). After a delay, free recall was low average (2/9 items recalled). Cued recall was average (4/9 items recalled). Performance on a yes/no recognition task was average although he demonstrated lower than expected recognition of target items (5/9 hits, only 1 false positive). On another verbal memory test, encoding and acquisition of contextual auditory information (i.e., short stories) was low end of average. After a delay, free recall was low average. Performance on a yes/no recognition task was within normal limits. With regard to non-verbal memory, delayed free recall of visual information was average. Executive functioning was within normal limits. Mental flexibility and set-shifting were high average on Trails B. Verbal fluency with phonemic search restrictions was average. Verbal abstract reasoning was average. Non-verbal abstract reasoning was high average. Performance on a clock drawing task was intact. On self-report measures of mood, the patient's responses were not indicative of clinically significant depression or anxiety at the present time.    Clinical Impressions: Mild cognitive impairment (MCI). Results of the current cognitive evaluation are largely within normal limits, with most areas of function within normal limits and consistent with estimated premorbid intellectual abilities.  However, he does demonstrate a relative weakness in memory encoding/retrieval and in semantic verbal fluency. His testing results do not warrant a diagnosis of dementia; however, a diagnosis mild cognitive impairment is appropriate at this time. Based on his cognitive profile, this may reflect prodromal Alzheimer's disease. Fortunately there is no sign of behavioral disturbance, emotional distress or primary psychiatric disorder.   Recommendations: Based on the findings of the present evaluation, the following recommendations are offered:  --Cholinesterase inhibitor therapy could be considered based on the possibility of prodromal AD.  --Regular cardiovascular exercise as well as continued involvement in social and mentally stimulating activities is recommended in order to promote brain health and possibly stave off further cognitive decline. --He is encouraged to continue using compensatory strategies for memory (e.g., reminders, calendar). His wife is assisting with management of his appointments and medications. --In order to reduce the risk of getting lost or disoriented when driving, he is encouraged to limit his driving to familiar destinations/routes in local areas during optimal weather conditions and during the day. --Neuropsychological re-evaluation in one to two years is recommended in order to monitor cognitive status,  track any progression of symptoms and further assist with treatment planning.   Feedback to Patient: Jacob [name] returned for a feedback appointment on *** to review the results of his neuropsychological evaluation with this provider. *** minutes face-to-face time was spent reviewing his test results, my impressions and my recommendations as detailed above. Education regarding MCI was provided and written information was given as well.   Total time spent on this patient's case: 90791x1 unit for interview with psychologist; [id number] units of testing by  psychometrician under psychologist's supervision; [phone] units for medical record review, scoring of neuropsychological tests, interpretation of test results, preparation of this report, and review of results to the patient by psychologist.      Thank you for your referral of Jacob [name]. Please feel free to contact me if you have any questions or concerns regarding this report.

## 2016-12-04 LAB — WOUND CULTURE: Organism ID, Bacteria: NONE SEEN

## 2016-12-08 ENCOUNTER — Encounter: Payer: Medicare Other | Admitting: Psychology

## 2016-12-10 ENCOUNTER — Ambulatory Visit (INDEPENDENT_AMBULATORY_CARE_PROVIDER_SITE_OTHER): Payer: Medicare Other | Admitting: Psychology

## 2016-12-10 ENCOUNTER — Encounter: Payer: Self-pay | Admitting: Psychology

## 2016-12-10 ENCOUNTER — Ambulatory Visit (INDEPENDENT_AMBULATORY_CARE_PROVIDER_SITE_OTHER): Payer: Medicare Other | Admitting: *Deleted

## 2016-12-10 DIAGNOSIS — I442 Atrioventricular block, complete: Secondary | ICD-10-CM

## 2016-12-10 DIAGNOSIS — G3184 Mild cognitive impairment, so stated: Secondary | ICD-10-CM | POA: Diagnosis not present

## 2016-12-10 DIAGNOSIS — R413 Other amnesia: Secondary | ICD-10-CM | POA: Diagnosis not present

## 2016-12-10 NOTE — Progress Notes (Signed)
NEUROPSYCHOLOGICAL EVALUATION   Name:    Jacob [name]  Date of Birth:   1934-02-21 Date of Interview:  11/23/2016 Date of Testing:  11/23/2016   Date of Feedback:  12/10/2016       Background Information:  Reason for Referral:  Jacob [name] is a 83 y.o. male referred by Dr. Domenick Gong of [place] to assess his current level of cognitive functioning and assist in differential diagnosis. The current evaluation consisted of a review of available medical records, an interview with the patient and his wife, and the completion of a neuropsychological testing battery. Informed consent was obtained.  History of Presenting Problem:  Jacob [name] reports gradual onset of memory difficulties over the past couple of years. The first symptom he noticed was difficulty with driving directions. He had to call his wife a few times for directions while driving. The patient states that he thinks this was happening because he was distracted by other thoughts and because he grew up in [place] where there was a grid system. He also notes that he has more difficulty completing the [facility] crossword puzzle and he can no longer complete the difficult level sodoku puzzles. Upon direct questioning, he and his wife also report some forgetfulness for recent conversations (he says this is due to not paying attention in the first place), misplacing items (per his wife, he disagrees), forgetting appointments, and forgetting to take medications. His wife feels he is having more trouble staying on task lately. His wife is managing his appointments, and she tries to remember to remind him to take his medications. He independently manages the finances including their investments and he is involved in daily stock trading.   There is a family history of dementia in his mother who died around age 63.   Physically, the patient complains of knee pain and rare balance difficulty. He has sleep apnea  and uses his CPAP faithfully. He has occasional sleep difficulty. He is gaining weight and eating more. He was walking 3 miles but hasn't done that lately, although he has been doing a lot of physical labor in the yard trimming bushes.   With regard to mood, the patient's wife reports he "has his ups and downs, more so in this period of his life". They both report he is more short tempered/irritable. They do not notice any increased anxiety or worrying. The patient states that he is "not doing anything rewarding, like building or creating anything, and there are things we would like to do but can't" due to his wife's physical limitations.    Psychiatric history was denied aside from situational depression when he was let go from his high level position in 1993 and could not find work afterwards.   Social History: Born/Raised: [place], [place] Education: Master's degree Occupational history: Previously worked for [employer] in very high level position but was let go in the early 90s and could not get another job locally (and refused to move out of state because they were raising their family here). 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 Marital history: Married 41 years with 8 daughters and 46 grandchildren Alcohol: None Tobacco: Former smoker SA: None  Medical History:  Past Medical History:  Diagnosis Date  . Anemia   . Arthritis    "middle finger both hands" (09/05/2014)  . Basal cell carcinoma   . Benign prostatic hypertrophy    hx of  . Colon polyp 2006,  2010   TUBULAR ADENOMA  . Diverticulosis of colon (without mention of hemorrhage)   . First degree atrioventricular block   . GERD (gastroesophageal reflux disease)   . Gout   . History of blood transfusion 12/1953; 03/1954   Transfused 7 pints blood '55, 4 pints '56  . History of duodenal ulcer 1955   w/hemorrhage  . Kidney stones    "passed them"  . Morbid obesity ([place])    "I was 294; lost 100#  2014-2015"  . MVP (mitral valve prolapse)   . OSA (obstructive sleep apnea)    "stopped wearing mask after I lost 100#"  . Osteoarthritis   . Other thalassemia ([place])   . Pneumonia "a number of times"  . Presence of permanent cardiac pacemaker   . Prostate cancer [facility])     Current medications:  Outpatient Encounter Prescriptions as of 12/08/2016  Medication Sig  . acetaminophen (TYLENOL) 500 MG tablet Take 500 mg by mouth every 6 (six) hours as needed (pain).   Marland Kitchen allopurinol (ZYLOPRIM) 300 MG tablet Take 1 tablet (300 mg total) by mouth daily.  . Ascorbic Acid (VITAMIN C PO) Take 1 capsule by mouth daily as needed (supplement).  Marland Kitchen b complex vitamins capsule Take 1 capsule by mouth daily.  . cephALEXin (KEFLEX) 500 MG capsule Take 1 capsule (500 mg total) by mouth 4 (four) times daily.  . Cholecalciferol (VITAMIN D3) 2000 UNITS capsule Take 1 capsule (2,000 Units total) by mouth daily.  . Coenzyme Q10 300 MG CAPS Take 300 mg by mouth daily.  . colchicine 0.6 MG tablet Take 0.6 mg by mouth daily as needed (gout).   . Cyanocobalamin (VITAMIN B 12 PO) Take 1,000 mg by mouth daily.   . diphenhydrAMINE (BENADRYL) 25 mg capsule Take 1 capsule (25 mg total) by mouth every 6 (six) hours as needed.  . FeFum-FePoly-FA-B Cmp-C-Biot (INTEGRA PLUS) CAPS TAKE 1 CAPSULE BY MOUTH EVERY MORNING  . fexofenadine (ALLEGRA) 180 MG tablet Take 180 mg by mouth daily.   . furosemide (LASIX) 40 MG tablet TAKE 1 TABLET(40 MG) BY MOUTH DAILY  . Omega-3 Fatty Acids (FISH OIL) 600 MG CAPS Take 600 mg by mouth daily.   . tamsulosin (FLOMAX) 0.4 MG CAPS capsule Take 0.4 mg by mouth daily.  Alveda Reasons 20 MG TABS tablet TAKE 1 TABLET(20 MG) BY MOUTH DAILY WITH SUPPER   No facility-administered encounter medications on file as of 12/08/2016.      Current Examination:  Behavioral Observations:   Appearance: Neatly, casually and appropriately dressed and groomed Gait: Ambulated independently, no gross abnormalities  observed Speech: Fluent; normal rate, rhythm and volume. No significant word finding difficulty. Thought process: Generally linear, goal directed Affect: Full, euthymic Interpersonal: Pleasant, appropriate Orientation: Oriented to person, place and most aspects of time (one day off on the current date). Accurately named the current President and his predecessor.   Tests Administered: . Test of Premorbid Functioning (TOPF) . Wechsler Adult Intelligence Scale-Fourth Edition (WAIS-IV): Similarities, Matrix Reasoning, Block Design, Coding and Digit Span subtests . Wechsler Memory Scale-Fourth Edition (WMS-IV) Older Adult Version (ages 44-90): Logical Memory I, II and Recognition subtests  . [employer] Verbal Learning Test - 2nd Edition (CVLT-2) Short Form . Repeatable Battery for the Assessment of Neuropsychological Status (RBANS) Form A:  Figure Copy and Recall subtests and Semantic Fluency subtest . Boston Naming Test (BNT) . Boston Diagnostic Aphasia Examination: Complex Ideational Material subtest . Controlled Oral Word Association Test (COWAT) . Trail Making Test A  and B . Clock drawing test . Geriatric Depression Scale (GDS) 15 Item . Generalized Anxiety Disorder - 7 item screener (GAD-7)  Test Results: Note: Standardized scores are presented only for use by appropriately trained professionals and to allow for any future test-retest comparison. These scores should not be interpreted without consideration of all the information that is contained in the rest of the report. The most recent standardization samples from the test publisher or other sources were used whenever possible to derive standard scores; scores were corrected for age, gender, ethnicity and education when available.   Test Scores:  Test Name Raw Score Standardized Score Descriptor  TOPF 60/70 SS= 118 High average  WAIS-IV Subtests     Similarities 20/36 ss= 9 Average  Matrix Reasoning 13/26 ss= 12 High average  Block  Design 36/66 ss= 13 High average  Coding 49/135 ss= 12 High average  Digit Span Forward 14/16 ss= 17 Very superior  Digit Span Backward 11/16 ss= 15 Superior  WMS-IV Subtests     LM I 22/53 ss= 8 Low end of average  LM II 6/39 ss= 7 Low average  LM II Recognition 17/23 Cum %: 26-50 WNL  RBANS Subtests     Figure Copy 18/20 Z= 0.4 Average  Figure Recall 9/20 Z= -0.6 Average  Semantic Fluency 7 Z= -2.8 Impaired  BNT 51/60 T= 50 Average  CVLT-II Scores     Trial 1 4/9 Z= -0.5 Average  Trial 4 5/9 Z= -1.5 Borderline  Trials 1-4 total 17/36 T= 39 Low average  SD Free Recall 5/9 Z= -0.5 Average  LD Free Recall 2/9 Z= -1 Low average  LD Cued Recall 4/9 Z= 0 Average  Recognition Discriminability 5/9 hits, 1 false positives Z= 0 Average  Forced Choice Recognition 9/9  WNL  BDAE Subtest     Complex Ideational Material 12/12  WNL  COWAT-FAS 37 T= 50 Average  COWAT-Animals 11 T= 38 Low average  Trail Making Test A  23" 0 errors T= 65 Superior  Trail Making Test B  64" 0 errors T= 61 High average  Clock Drawing   WNL  GDS-15 0/15  WNL  GAD-7 1/21  WNL      Description of Test Results:  Premorbid verbal intellectual abilities were estimated to have been within the high average range based on a test of word reading. Psychomotor processing speed was high average to superior. Auditory attention and working memory were very superior to superior, respectively. Visual-spatial construction was high average to average. Language abilities were somewhat variable. Specifically, confrontation naming was average, and semantic verbal fluency ranged from low average (for animals) to impaired (for fruits and vegetables). Auditory comprehension of complex ideational material was intact. With regard to verbal memory, encoding and acquisition of non-contextual information (i.e., word list) was low average. After a brief distracter task, free recall was average (5/9 items recalled). After a delay, free recall  was low average (2/9 items recalled). Cued recall was average (4/9 items recalled). Performance on a yes/no recognition task was average although he demonstrated lower than expected recognition of target items (5/9 hits, only 1 false positive). On another verbal memory test, encoding and acquisition of contextual auditory information (i.e., short stories) was low end of average. After a delay, free recall was low average. Performance on a yes/no recognition task was within normal limits. With regard to non-verbal memory, delayed free recall of visual information was average. Executive functioning was within normal limits. Mental flexibility and set-shifting were high  average on Trails B. Verbal fluency with phonemic search restrictions was average. Verbal abstract reasoning was average. Non-verbal abstract reasoning was high average. Performance on a clock drawing task was intact. On self-report measures of mood, the patient's responses were not indicative of clinically significant depression or anxiety at the present time.    Clinical Impressions: Mild cognitive impairment (MCI). Results of the current cognitive evaluation are largely within normal limits, with most areas of function within normal limits and consistent with estimated premorbid intellectual abilities. However, he does demonstrate a relative weakness in memory encoding/retrieval and in semantic verbal fluency. His testing results do not warrant a diagnosis of dementia; however, a diagnosis mild cognitive impairment is appropriate at this time. Based on his cognitive profile, this may reflect prodromal Alzheimer's disease. Fortunately there is no sign of behavioral disturbance, emotional distress or primary psychiatric disorder.   Recommendations: Based on the findings of the present evaluation, the following recommendations are offered:  --Cholinesterase inhibitor therapy could be considered based on the possibility of prodromal AD.    --Regular cardiovascular exercise as well as continued involvement in social and mentally stimulating activities is recommended in order to promote brain health and possibly stave off further cognitive decline. --He is encouraged to continue using compensatory strategies for memory (e.g., reminders, calendar). His wife is assisting with management of his appointments and medications. --In order to reduce the risk of getting lost or disoriented when driving, he is encouraged to limit his driving to familiar destinations/routes in local areas during optimal weather conditions and during the day. --Neuropsychological re-evaluation in one to two years is recommended in order to monitor cognitive status, track any progression of symptoms and further assist with treatment planning.   Feedback to Patient: Jacob [name] and his wife returned for a feedback appointment on 12/10/2016 to review the results of his neuropsychological evaluation with this provider. 45 minutes face-to-face time was spent reviewing his test results, my impressions and my recommendations as detailed above. Education regarding MCI was provided.   Total time spent on this patient's case: [id number] unit for interview with psychologist; [phone] units of testing by psychometrician under psychologist's supervision; [phone] units for medical record review, scoring of neuropsychological tests, interpretation of test results, preparation of this report, and review of results to the patient by psychologist.      Thank you for your referral of Jacob [name]. Please feel free to contact me if you have any questions or concerns regarding this report.

## 2016-12-11 NOTE — Progress Notes (Signed)
Remote pacemaker transmission.   

## 2016-12-15 ENCOUNTER — Telehealth: Payer: Self-pay | Admitting: Family Medicine

## 2016-12-15 ENCOUNTER — Encounter: Payer: Self-pay | Admitting: Cardiology

## 2016-12-15 ENCOUNTER — Telehealth: Payer: Self-pay | Admitting: Cardiology

## 2016-12-15 NOTE — Telephone Encounter (Signed)
Instruct patient to continue his anti-biotic. If he has more fevers or evidence of infection after his anti-biotic therapy then additional evaluation would be recommended. GT

## 2016-12-15 NOTE — Telephone Encounter (Signed)
Pt of Dr. Percival Spanish. Sees Dr. Lovena Le for EP  Spoke to patient. Notes he saw primary care on 9/17- he was put on Keflex for an infection in his arm. 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  Patient notes he had done some reading/research, & expressed concern that the infection might spread or affect his heart - he stated particular concern regarding his pacemaker leads. He had read that this organism can colonize on prosthetic heart valves and wanted to know if this could also mean concern for pacing apparatus. Informed patient I was unable to address this question, but would defer to MD for any advice. Pt does state the wound is healing, and he notes no other particular concerns or symptoms. Routed to MD to advise.

## 2016-12-15 NOTE — Telephone Encounter (Signed)
New message  Pt verbalized that he is calling for the rn   Pt states that he has a lab test done and its on Mychart for viewing   It was done on 12-04-2016 at [facility] please call pt

## 2016-12-15 NOTE — Telephone Encounter (Signed)
Patient wanted me to send last lab to dr Pixie Casino I sent it today at 1:36pm

## 2016-12-16 NOTE — Telephone Encounter (Signed)
Spoke w patient, communicated advice. Pt affirms he is not having issues following antibiotic use. Aware to call if he should have new symptoms or concerns. He verbalized his thanks for assistance and advice provided.

## 2016-12-22 LAB — CUP PACEART REMOTE DEVICE CHECK
Brady Statistic AP VP Percent: 80.37 %
Brady Statistic AS VP Percent: 17.96 %
Brady Statistic AS VS Percent: 1.15 %
Brady Statistic RV Percent Paced: 97.59 %
Implantable Lead Implant Date: 20160622
Implantable Lead Location: 753859
Implantable Lead Model: 5076
Lead Channel Impedance Value: 323 Ohm
Lead Channel Impedance Value: 342 Ohm
Lead Channel Impedance Value: 399 Ohm
Lead Channel Impedance Value: 437 Ohm
Lead Channel Pacing Threshold Amplitude: 0.625 V
Lead Channel Pacing Threshold Pulse Width: 0.4 ms
Lead Channel Sensing Intrinsic Amplitude: 12.375 mV
Lead Channel Setting Pacing Amplitude: 2 V
Lead Channel Setting Pacing Amplitude: 2.5 V
Lead Channel Setting Pacing Pulse Width: 0.4 ms
MDC IDC LEAD IMPLANT DT: 20160622
MDC IDC LEAD LOCATION: 753860
MDC IDC MSMT BATTERY REMAINING LONGEVITY: 83 mo
MDC IDC MSMT BATTERY VOLTAGE: 3.01 V
MDC IDC MSMT LEADCHNL RA PACING THRESHOLD AMPLITUDE: 0.625 V
MDC IDC MSMT LEADCHNL RA PACING THRESHOLD PULSEWIDTH: 0.4 ms
MDC IDC MSMT LEADCHNL RA SENSING INTR AMPL: 5.5 mV
MDC IDC MSMT LEADCHNL RA SENSING INTR AMPL: 5.5 mV
MDC IDC MSMT LEADCHNL RV SENSING INTR AMPL: 12.375 mV
MDC IDC PG IMPLANT DT: 20160622
MDC IDC SESS DTM: 20180927154926
MDC IDC SET LEADCHNL RV SENSING SENSITIVITY: 4 mV
MDC IDC STAT BRADY AP VS PERCENT: 0.52 %
MDC IDC STAT BRADY RA PERCENT PACED: 75.86 %

## 2016-12-25 ENCOUNTER — Other Ambulatory Visit: Payer: Self-pay | Admitting: Internal Medicine

## 2016-12-25 DIAGNOSIS — D568 Other thalassemias: Secondary | ICD-10-CM

## 2017-02-01 ENCOUNTER — Other Ambulatory Visit (HOSPITAL_BASED_OUTPATIENT_CLINIC_OR_DEPARTMENT_OTHER): Payer: Medicare Other

## 2017-02-01 DIAGNOSIS — D509 Iron deficiency anemia, unspecified: Secondary | ICD-10-CM | POA: Diagnosis not present

## 2017-02-01 DIAGNOSIS — D508 Other iron deficiency anemias: Secondary | ICD-10-CM

## 2017-02-01 DIAGNOSIS — D561 Beta thalassemia: Secondary | ICD-10-CM

## 2017-02-01 DIAGNOSIS — D568 Other thalassemias: Secondary | ICD-10-CM

## 2017-02-01 LAB — CBC WITH DIFFERENTIAL/PLATELET
BASO%: 0.6 % (ref 0.0–2.0)
Basophils Absolute: 0 10*3/uL (ref 0.0–0.1)
EOS ABS: 0.2 10*3/uL (ref 0.0–0.5)
EOS%: 3.3 % (ref 0.0–7.0)
HCT: 35.8 % — ABNORMAL LOW (ref 38.4–49.9)
HGB: 11.7 g/dL — ABNORMAL LOW (ref 13.0–17.1)
LYMPH%: 31.3 % (ref 14.0–49.0)
MCH: 20.5 pg — AB (ref 27.2–33.4)
MCHC: 32.7 g/dL (ref 32.0–36.0)
MCV: 62.8 fL — AB (ref 79.3–98.0)
MONO#: 0.4 10*3/uL (ref 0.1–0.9)
MONO%: 8 % (ref 0.0–14.0)
NEUT%: 56.8 % (ref 39.0–75.0)
NEUTROS ABS: 2.8 10*3/uL (ref 1.5–6.5)
Platelets: 172 10*3/uL (ref 140–400)
RBC: 5.7 10*6/uL (ref 4.20–5.82)
RDW: 17.1 % — ABNORMAL HIGH (ref 11.0–14.6)
WBC: 4.9 10*3/uL (ref 4.0–10.3)
lymph#: 1.5 10*3/uL (ref 0.9–3.3)

## 2017-02-01 LAB — IRON AND TIBC
%SAT: 24 % (ref 20–55)
Iron: 101 ug/dL (ref 42–163)
TIBC: 418 ug/dL — ABNORMAL HIGH (ref 202–409)
UIBC: 317 ug/dL (ref 117–376)

## 2017-02-01 LAB — FERRITIN: FERRITIN: 37 ng/mL (ref 22–316)

## 2017-02-08 ENCOUNTER — Encounter: Payer: Self-pay | Admitting: Internal Medicine

## 2017-02-08 ENCOUNTER — Telehealth: Payer: Self-pay | Admitting: Internal Medicine

## 2017-02-08 ENCOUNTER — Ambulatory Visit (HOSPITAL_BASED_OUTPATIENT_CLINIC_OR_DEPARTMENT_OTHER): Payer: Medicare Other | Admitting: Internal Medicine

## 2017-02-08 VITALS — BP 133/65 | HR 89 | Temp 97.9°F | Resp 18 | Ht 66.0 in | Wt 253.2 lb

## 2017-02-08 DIAGNOSIS — D561 Beta thalassemia: Secondary | ICD-10-CM

## 2017-02-08 DIAGNOSIS — D568 Other thalassemias: Secondary | ICD-10-CM

## 2017-02-08 DIAGNOSIS — D509 Iron deficiency anemia, unspecified: Secondary | ICD-10-CM | POA: Diagnosis not present

## 2017-02-08 DIAGNOSIS — D508 Other iron deficiency anemias: Secondary | ICD-10-CM

## 2017-02-08 NOTE — Progress Notes (Signed)
[facility] [phone]   [phone]  OFFICE PROGRESS NOTE  [name], Jacob Him, MD [place] [address]  DIAGNOSIS: History of beta thalassemia minor and iron deficiency anemia  PRIOR THERAPY: Status post Feraheme infusion 510 mg IV last dose was given on 03/01/2015.  CURRENT THERAPY: Integra plus 1 capsule by mouth daily.  INTERVAL HISTORY: Jacob [name] 83 y.o. male returns to the clinic today for six-month follow-up visit.  The patient is feeling fine today with no specific complaints.  He denied having any chest pain, shortness of breath, cough or hemoptysis.  He denied having any fatigue or weakness.  He has no nausea, vomiting, diarrhea or constipation.  He has no recent weight loss but he is starting a diet program to lose weight in the next few months.  He is here today for evaluation and repeat blood work.  MEDICAL HISTORY: Past Medical History:  Diagnosis Date  . Anemia   . Arthritis    "middle finger both hands" (09/05/2014)  . Basal cell carcinoma   . Benign prostatic hypertrophy    hx of  . Colon polyp 2006, 2010   TUBULAR ADENOMA  . Diverticulosis of colon (without mention of hemorrhage)   . First degree atrioventricular block   . GERD (gastroesophageal reflux disease)   . Gout   . History of blood transfusion 12/1953; 03/1954   Transfused 7 pints blood '55, 4 pints '56  . History of duodenal ulcer 1955   w/hemorrhage  . Kidney stones    "passed them"  . Morbid obesity ([place])    "I was 294; lost 100# 2014-2015"  . MVP (mitral valve prolapse)   . OSA (obstructive sleep apnea)    "stopped wearing mask after I lost 100#"  . Osteoarthritis   . Other thalassemia ([place])   . Pneumonia "a number of times"  . Presence of permanent cardiac pacemaker   . Prostate cancer ([place])     ALLERGIES:  is allergic to prednisone and sulfonamide derivatives.  MEDICATIONS:  Current Outpatient Medications    Medication Sig Dispense Refill  . acetaminophen (TYLENOL) 500 MG tablet Take 500 mg by mouth every 6 (six) hours as needed (pain).     Marland Kitchen allopurinol (ZYLOPRIM) 300 MG tablet Take 1 tablet (300 mg total) by mouth daily. 90 tablet 3  . Ascorbic Acid (VITAMIN C PO) Take 1 capsule by mouth daily as needed (supplement).    Marland Kitchen b complex vitamins capsule Take 1 capsule by mouth daily.    . Cholecalciferol (VITAMIN D3) 2000 UNITS capsule Take 1 capsule (2,000 Units total) by mouth daily.    . Coenzyme Q10 300 MG CAPS Take 300 mg by mouth daily.    . colchicine 0.6 MG tablet Take 0.6 mg by mouth daily as needed (gout).     . Cyanocobalamin (VITAMIN B 12 PO) Take 1,000 mg by mouth daily.     Marland Kitchen FeFum-FePoly-FA-B Cmp-C-Biot (INTEGRA PLUS) CAPS TAKE 1 CAPSULE BY MOUTH EVERY MORNING 90 capsule 0  . fexofenadine (ALLEGRA) 180 MG tablet Take 180 mg by mouth daily.     . furosemide (LASIX) 40 MG tablet TAKE 1 TABLET(40 MG) BY MOUTH DAILY 30 tablet 9  . Omega-3 Fatty Acids (FISH OIL) 600 MG CAPS Take 600 mg by mouth daily.     . tamsulosin (FLOMAX) 0.4 MG CAPS capsule Take 0.4 mg by mouth daily.  11  . XARELTO 20 MG TABS tablet TAKE  1 TABLET(20 MG) BY MOUTH DAILY WITH SUPPER 30 tablet 6  . diphenhydrAMINE (BENADRYL) 25 mg capsule Take 1 capsule (25 mg total) by mouth every 6 (six) hours as needed. 20 capsule 0   No current facility-administered medications for this visit.     SURGICAL HISTORY:  Past Surgical History:  Procedure Laterality Date  . CATARACT EXTRACTION W/ INTRAOCULAR LENS  IMPLANT, BILATERAL  2002-2004  . CLOSED REDUCTION ANKLE DISLOCATION Right 1980's   "toe my achilles"  . COLONOSCOPY  2015   multiple   . EP IMPLANTABLE DEVICE N/A 09/05/2014   Procedure: Pacemaker Implant;  Surgeon: [name], MD;  Location: [facility];  Service: Cardiovascular;  Laterality: N/A;  . ESOPHAGOGASTRODUODENOSCOPY  2011   multiple  . GASTRECTOMY  03/1954   Transfused 7 pints blood '55, 4 pints  '56  . GASTRECTOMY  1956   partial  . INSERT / REPLACE / REMOVE PACEMAKER  09/05/2014  . MOLE REMOVAL Left 2008   "forearm"  . TUMOR REMOVAL Left 2002   from finger- left index    REVIEW OF SYSTEMS:  A comprehensive review of systems was negative.   PHYSICAL EXAMINATION: General appearance: alert, cooperative and no distress Head: Normocephalic, without obvious abnormality, atraumatic Neck: no adenopathy, no JVD, supple, symmetrical, trachea midline and thyroid not enlarged, symmetric, no tenderness/mass/nodules Lymph nodes: Cervical, supraclavicular, and axillary nodes normal. Resp: clear to auscultation bilaterally Back: symmetric, no curvature. ROM normal. No CVA tenderness. Cardio: regular rate and rhythm, S1, S2 normal, no murmur, click, rub or gallop GI: soft, non-tender; bowel sounds normal; no masses,  no organomegaly Extremities: extremities normal, atraumatic, no cyanosis or edema  ECOG PERFORMANCE STATUS: 0 - Asymptomatic  Blood pressure 133/65, pulse 89, temperature 97.9 F (36.6 C), temperature source Oral, resp. rate 18, height 5\' 6"  (1.676 m), weight 253 lb 3.2 oz (114.9 kg), SpO2 99 %.  LABORATORY DATA: Lab Results  Component Value Date   WBC 4.9 02/01/2017   HGB 11.7 (L) 02/01/2017   HCT 35.8 (L) 02/01/2017   MCV 62.8 (L) 02/01/2017   PLT 172 02/01/2017      Chemistry      Component Value Date/Time   NA 138 08/27/2014 0916   NA 141 12/12/2012 1522   K 4.6 08/27/2014 0916   K 4.2 12/12/2012 1522   CL 103 08/27/2014 0916   CO2 27 08/27/2014 0916   CO2 24 12/12/2012 1522   BUN 32 (H) 08/27/2014 0916   BUN 22.4 12/12/2012 1522   CREATININE 0.96 08/27/2014 0916   CREATININE 1.1 12/12/2012 1522      Component Value Date/Time   CALCIUM 9.6 08/27/2014 0916   CALCIUM 9.7 12/12/2012 1522   ALKPHOS 70 05/03/2013 1300   ALKPHOS 86 12/12/2012 1522   AST 26 05/03/2013 1300   AST 21 12/12/2012 1522   ALT 20 05/03/2013 1300   ALT 19 12/12/2012 1522    BILITOT 1.9 (H) 05/03/2013 1300   BILITOT 0.96 12/12/2012 1522     Other lab results: Ferritin 37, serum iron 101, total iron binding capacity 418 and iron saturation 24%.  RADIOGRAPHIC STUDIES: No results found.  ASSESSMENT AND PLAN:  This is a very pleasant 83 years old white male with beta thalassemia minor as well as iron deficiency anemia. He is currently on oral iron tablets with Integra plus 1 capsule by mouth daily.  The patient continues to tolerate his treatment with Integra plus fairly well. Has hemoglobin, hematocrit as well as  iron studies and ferritin are good today except for the mild anemia secondary to his history of thalassemia. I recommended for the patient to continue his current treatment with Integra plus.  I would see him back for follow-up visit in 6 months with repeat CBC, iron studies and ferritin. He was advised to call immediately if he has any concerning symptoms in the interval. The patient voices understanding of current disease status and treatment options and is in agreement with the current care plan. All questions were answered. The patient knows to call the clinic with any problems, questions or concerns. We can certainly see the patient much sooner if necessary.  Disclaimer: This note was dictated with voice recognition software. Similar sounding words can inadvertently be transcribed and may not be corrected upon review.

## 2017-02-08 NOTE — Telephone Encounter (Signed)
Gave avs and calendar for May and June 2019 °

## 2017-02-09 ENCOUNTER — Encounter: Payer: Self-pay | Admitting: Pulmonary Disease

## 2017-02-09 ENCOUNTER — Ambulatory Visit (INDEPENDENT_AMBULATORY_CARE_PROVIDER_SITE_OTHER): Payer: Medicare Other | Admitting: Pulmonary Disease

## 2017-02-09 VITALS — BP 114/70 | HR 79 | Ht 67.0 in | Wt 250.6 lb

## 2017-02-09 DIAGNOSIS — Z9989 Dependence on other enabling machines and devices: Secondary | ICD-10-CM

## 2017-02-09 DIAGNOSIS — G4733 Obstructive sleep apnea (adult) (pediatric): Secondary | ICD-10-CM

## 2017-02-09 NOTE — Patient Instructions (Signed)
Increase CPAP to 10-18  Download in 1 month

## 2017-02-09 NOTE — Assessment & Plan Note (Signed)
Weight loss obviously is key here

## 2017-02-09 NOTE — Progress Notes (Signed)
   Subjective:    Patient ID: Jacob [name], male    DOB: 09/17/1933, 83 y.o.   MRN: [id number]  HPI  83 year old for follow-up of severe OSA. He obtain a new CPAP 03/2016 and download on auto settings on his follow-up visit in 07/2016 showed residual AHI of 17/hour  Sleep history obtained from his wife.  He always has some degree of insomnia and has always throughout his adult life work done just 5 hours of sleep.  He often naps in the afternoon for anywhere from 30 minutes to an hour on the couch.  He denies problems with mask or pressure.  He has gained weight to his current weight of 250 pounds and is now starting a weight loss program  Download today shows a residual AHI 29/hour with significant apneas and hypotony is minimal central events on large leak with reasonable compliance  Significant tests/ events reviewed  PSG in 04/1998 -wt 295 pounds - severe OSA with AHI of 99/hour  corrected by CPAP of 9 cm Repeat PSG 01/2002 when he had lost some weight showed AHI of 57/hour.  HST 03/2016 AHI 49/h  Review of Systems Patient denies significant dyspnea,cough, hemoptysis,  chest pain, palpitations, pedal edema, orthopnea, paroxysmal nocturnal dyspnea, lightheadedness, nausea, vomiting, abdominal or  leg pains      Objective:   Physical Exam  Gen. Pleasant, obese, in no distress ENT - no lesions, no post nasal drip Neck: No JVD, no thyromegaly, no carotid bruits Lungs: no use of accessory muscles, no dullness to percussion, decreased without rales or rhonchi  Cardiovascular: Rhythm regular, heart sounds  normal, no murmurs or gallops, no peripheral edema Musculoskeletal: No deformities, no cyanosis or clubbing , no tremors       Assessment & Plan:

## 2017-02-09 NOTE — Assessment & Plan Note (Signed)
  He still has residual events on auto settings 10-15 cm with a large leak. Increase CPAP to 10-18  Download in 1 month.  We will also get a new mask and hopefully this will help with the leak  Weight loss encouraged, compliance with goal of at least 4-6 hrs every night is the expectation. Advised against medications with sedative side effects Cautioned against driving when sleepy - understanding that sleepiness will vary on a day to day basis

## 2017-02-19 ENCOUNTER — Other Ambulatory Visit: Payer: Self-pay | Admitting: Cardiology

## 2017-02-26 ENCOUNTER — Encounter: Payer: Self-pay | Admitting: Cardiology

## 2017-03-01 NOTE — Progress Notes (Signed)
HPI The patient presents for followup of his slow heart rate, fib/flutter.  He is status post pacemaker placement.  Since I last saw him he has done well.  The patient denies any new symptoms such as chest discomfort, neck or arm discomfort. There has been no new shortness of breath, PND or orthopnea. There have been no reported palpitations, presyncope or syncope.  He walks most days.  He cannot seem to lose weight is his only complaint.      Allergies  Allergen Reactions  . Prednisone     REACTION: GI bleed  . Sulfonamide Derivatives     REACTION: Rash    Current Outpatient Medications  Medication Sig Dispense Refill  . acetaminophen (TYLENOL) 500 MG tablet Take 500 mg by mouth every 6 (six) hours as needed (pain).     Marland Kitchen allopurinol (ZYLOPRIM) 300 MG tablet Take 1 tablet (300 mg total) by mouth daily. 90 tablet 3  . Ascorbic Acid (VITAMIN C PO) Take 1 capsule by mouth daily as needed (supplement).    Marland Kitchen b complex vitamins capsule Take 1 capsule by mouth daily.    . Cholecalciferol (VITAMIN D3) 2000 UNITS capsule Take 1 capsule (2,000 Units total) by mouth daily.    . Coenzyme Q10 300 MG CAPS Take 300 mg by mouth daily.    . colchicine 0.6 MG tablet Take 0.6 mg by mouth daily as needed (gout).     . Cyanocobalamin (VITAMIN B 12 PO) Take 1,000 mg by mouth daily.     Marland Kitchen FeFum-FePoly-FA-B Cmp-C-Biot (INTEGRA PLUS) CAPS TAKE 1 CAPSULE BY MOUTH EVERY MORNING 90 capsule 0  . fexofenadine (ALLEGRA) 180 MG tablet Take 180 mg by mouth daily.     . furosemide (LASIX) 40 MG tablet TAKE 1 TABLET(40 MG) BY MOUTH DAILY 30 tablet 0  . Omega-3 Fatty Acids (FISH OIL) 600 MG CAPS Take 600 mg by mouth daily.     . tamsulosin (FLOMAX) 0.4 MG CAPS capsule Take 0.4 mg by mouth daily.  11  . XARELTO 20 MG TABS tablet TAKE 1 TABLET(20 MG) BY MOUTH DAILY WITH SUPPER 30 tablet 6   No current facility-administered medications for this visit.     Past Medical History:  Diagnosis Date  . Anemia   .  Arthritis    "middle finger both hands" (09/05/2014)  . Basal cell carcinoma   . Benign prostatic hypertrophy    hx of  . Colon polyp 2006, 2010   TUBULAR ADENOMA  . Diverticulosis of colon (without mention of hemorrhage)   . First degree atrioventricular block   . GERD (gastroesophageal reflux disease)   . Gout   . History of blood transfusion 12/1953; 03/1954   Transfused 7 pints blood '55, 4 pints '56  . History of duodenal ulcer 1955   w/hemorrhage  . Kidney stones    "passed them"  . Morbid obesity ([place])    "I was 294; lost 100# 2014-2015"  . MVP (mitral valve prolapse)   . OSA (obstructive sleep apnea)    "stopped wearing mask after I lost 100#"  . Osteoarthritis   . Other thalassemia ([place])   . Pneumonia "a number of times"  . Presence of permanent cardiac pacemaker   . Prostate cancer [facility])     Past Surgical History:  Procedure Laterality Date  . CATARACT EXTRACTION W/ INTRAOCULAR LENS  IMPLANT, BILATERAL  2002-2004  . CLOSED REDUCTION ANKLE DISLOCATION Right 1980's   "toe my achilles"  . COLONOSCOPY  2015   multiple   . EP IMPLANTABLE DEVICE N/A 09/05/2014   Procedure: Pacemaker Implant;  Surgeon: [name], MD;  Location: [facility];  Service: Cardiovascular;  Laterality: N/A;  . ESOPHAGOGASTRODUODENOSCOPY  2011   multiple  . GASTRECTOMY  03/1954   Transfused 7 pints blood '55, 4 pints '56  . GASTRECTOMY  1956   partial  . INSERT / REPLACE / REMOVE PACEMAKER  09/05/2014  . MOLE REMOVAL Left 2008   "forearm"  . TUMOR REMOVAL Left 2002   from finger- left index    ROS:  As stated in the HPI and negative for all other systems.  PHYSICAL EXAM BP (!) 146/72   Pulse 83   Ht 5\' 7"  (1.702 m)   Wt 249 lb (112.9 kg)   BMI 39.00 kg/m   GENERAL:  Well appearing NECK:  No jugular venous distention, waveform within normal limits, carotid upstroke brisk and symmetric, no bruits, no thyromegaly LUNGS:  Clear to auscultation bilaterally CHEST:  Pacemaker  pocket well healed.     HEART:  PMI not displaced or sustained,S1 and S2 within normal limits, no S3, no S4, no clicks, no rubs, no murmurs ABD:  Flat, positive bowel sounds normal in frequency in pitch, no bruits, no rebound, no guarding, no midline pulsatile mass, no hepatomegaly, no splenomegaly EXT:  2 plus pulses throughout, no edema, no cyanosis no clubbing  EKG:   Sinus rhythm with ventricular pacing 100% capture.  03/04/2017  CBC    Component Value Date/Time   WBC 4.9 02/01/2017 1106   WBC 6.2 12/20/2014 1158   RBC 5.70 02/01/2017 1106   RBC 5.73 12/20/2014 1158   HGB 11.7 (L) 02/01/2017 1106   HCT 35.8 (L) 02/01/2017 1106   PLT 172 02/01/2017 1106   MCV 62.8 (L) 02/01/2017 1106   MCH 20.5 (L) 02/01/2017 1106   MCH 17.3 (L) 12/20/2014 1158   MCHC 32.7 02/01/2017 1106   MCHC 30.1 12/20/2014 1158   RDW 17.1 (H) 02/01/2017 1106   LYMPHSABS 1.5 02/01/2017 1106   MONOABS 0.4 02/01/2017 1106   EOSABS 0.2 02/01/2017 1106   BASOSABS 0.0 02/01/2017 1106    ASSESSMENT AND PLAN  ATRIAL FIB -   [name] has a CHA2DS2 - VASc score of 2 with a risk of stroke of 2.2%.  No change in therapy.   BRADYCARDIA - His last pacemaker interrogation demonstrated normal function with very rare atrial fibrillation and this was in October.  I reviewed this.  No change in therapy and he continues to follow with Dr. Lovena Le.   EDEMA - This is very mild.  No further evaluation.  I reviewed the echocardiogram from May and he had some mild left ear of some mild  MR - This was mild with some mild left ear in May and I will follow this clinically and probably with a repeat echo next December.

## 2017-03-04 ENCOUNTER — Encounter: Payer: Self-pay | Admitting: Cardiology

## 2017-03-04 ENCOUNTER — Ambulatory Visit (INDEPENDENT_AMBULATORY_CARE_PROVIDER_SITE_OTHER): Payer: Medicare Other | Admitting: Cardiology

## 2017-03-04 VITALS — BP 146/72 | HR 83 | Ht 67.0 in | Wt 249.0 lb

## 2017-03-04 DIAGNOSIS — R609 Edema, unspecified: Secondary | ICD-10-CM

## 2017-03-04 DIAGNOSIS — R001 Bradycardia, unspecified: Secondary | ICD-10-CM | POA: Diagnosis not present

## 2017-03-04 DIAGNOSIS — I480 Paroxysmal atrial fibrillation: Secondary | ICD-10-CM | POA: Diagnosis not present

## 2017-03-04 NOTE — Patient Instructions (Signed)
Medication Instructions:  Continue current medications  If you need a refill on your cardiac medications before your next appointment, please call your pharmacy.  Labwork: None Ordered  Testing/Procedures: None Ordered  Special Instructions:  Happy Holidays!!!  Follow-Up: Your physician wants you to follow-up in: 1 Year. You should receive a reminder letter in the mail two months in advance. If you do not receive a letter, please call our office [phone].    Thank you for choosing [facility] at [facility]!!

## 2017-03-10 IMAGING — US US EXTREM LOW VENOUS BILAT
1 series · 13 of 24 positions shown · non-contrast
Comparison: None.

CLINICAL DATA: Bilateral calf swelling for 5 days. Known prostate
cancer. Pitting edema.



[Series 1: us extrem low venous bilat · 0.07mm/px · 13 of 62 slices shown]
[im 1/62]
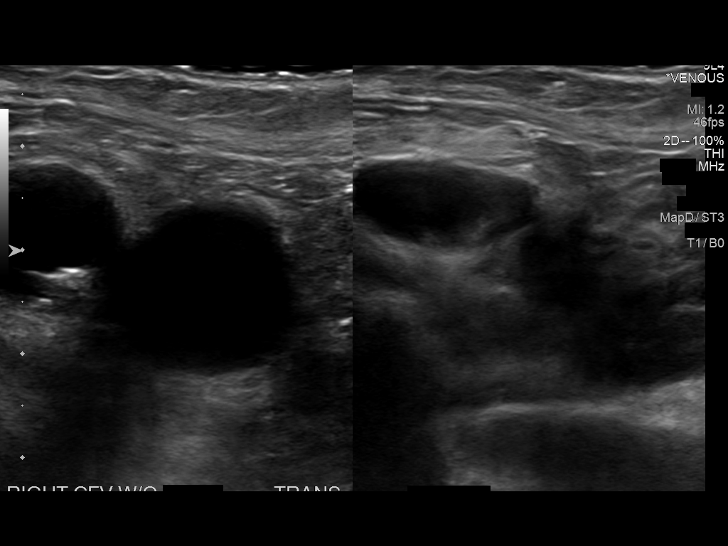
[im 6/62]
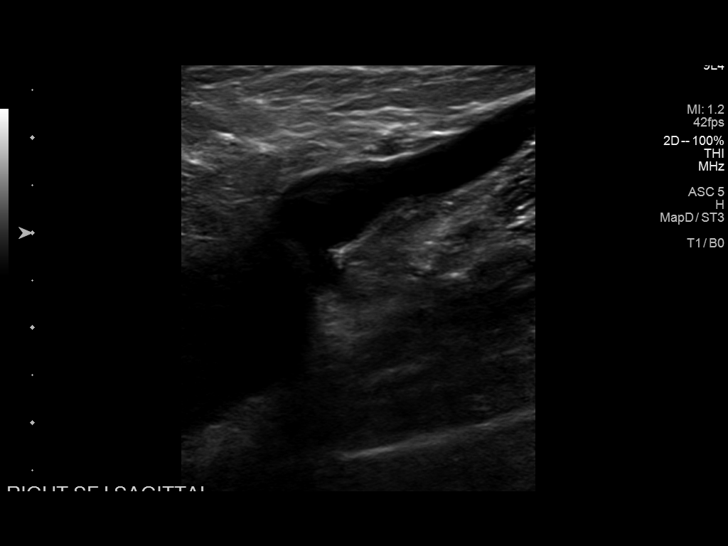
[im 11/62]
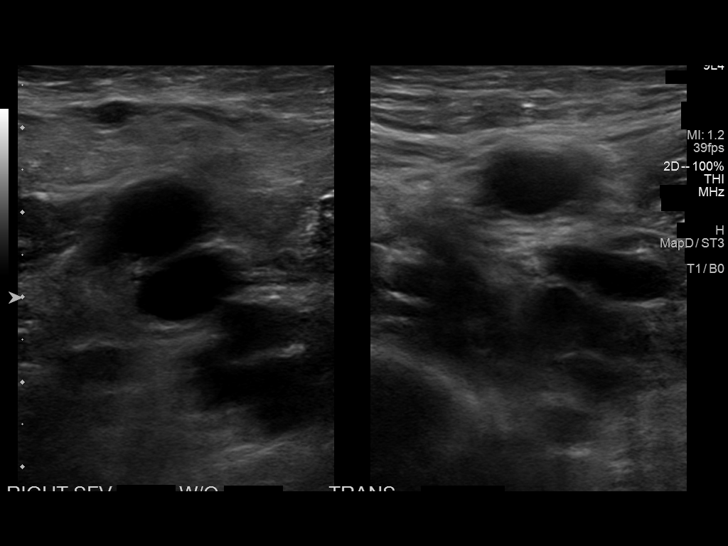
[im 16/62]
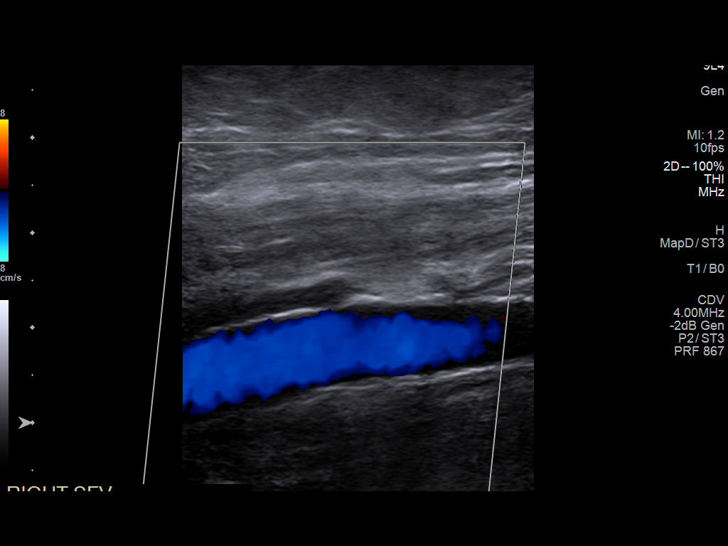
[im 22/62]
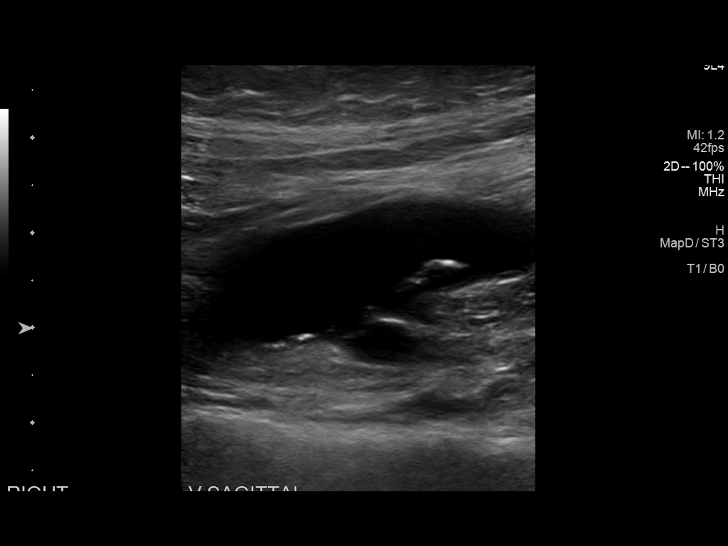
[im 27/62]
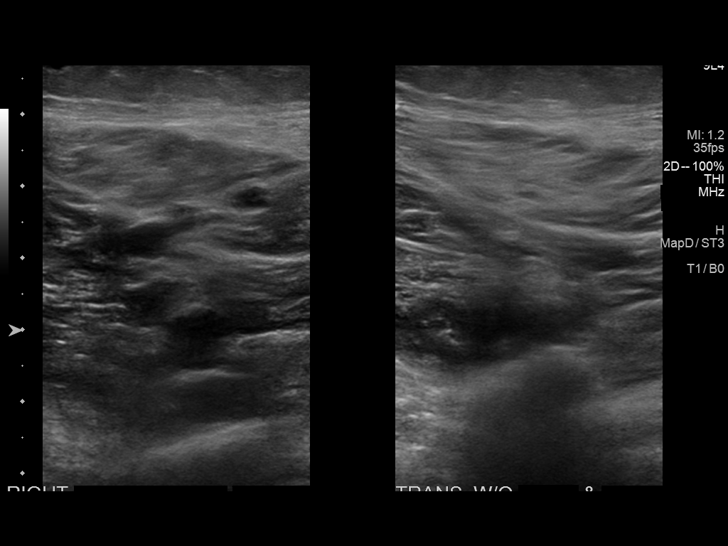
[im 32/62]
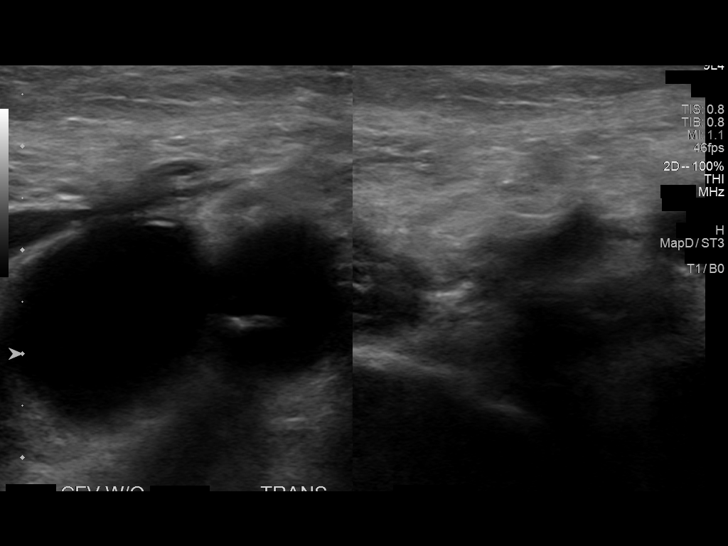
[im 35/62]
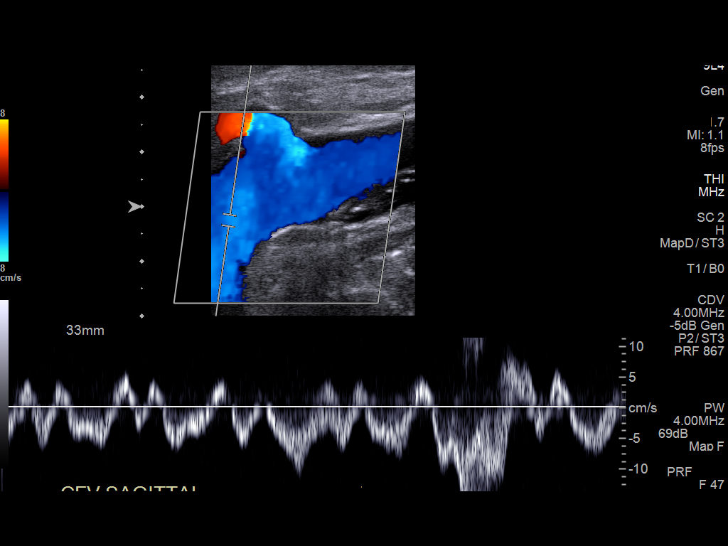
[im 40/62]
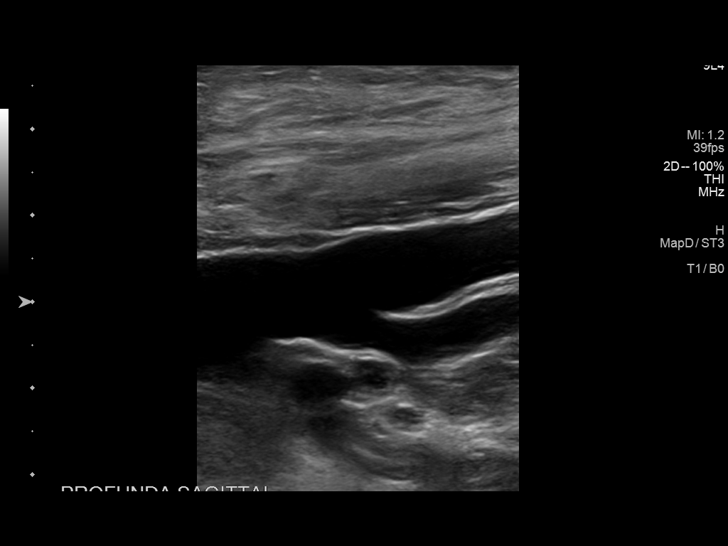
[im 46/62]
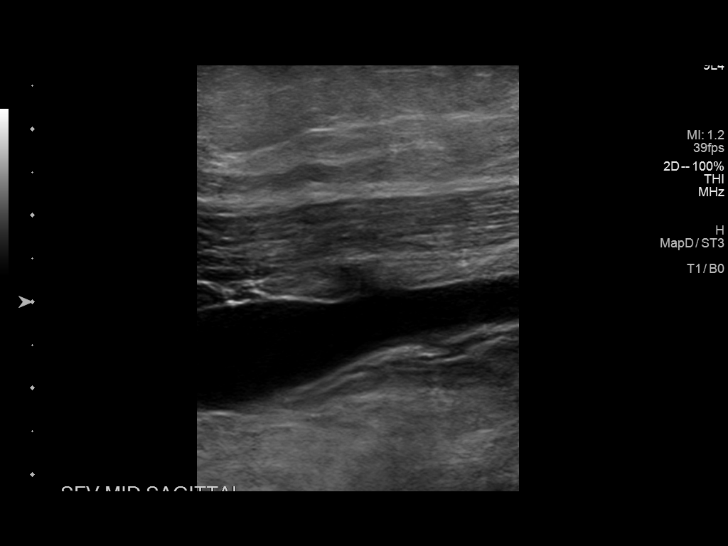
[im 51/62]
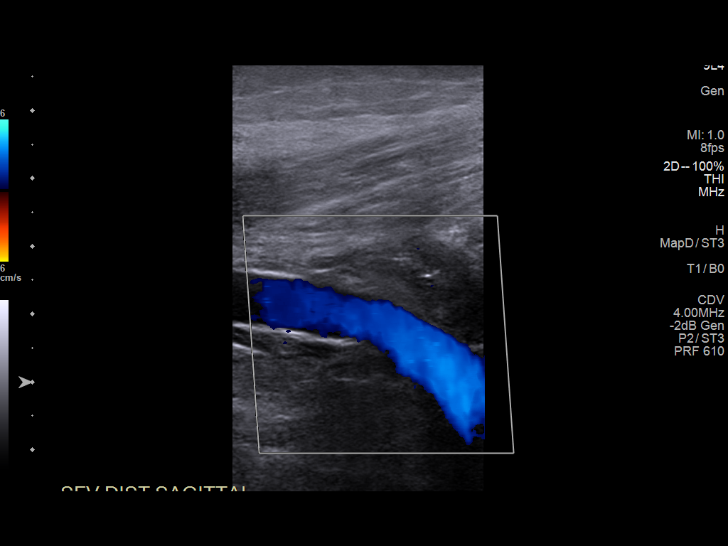
[im 56/62]
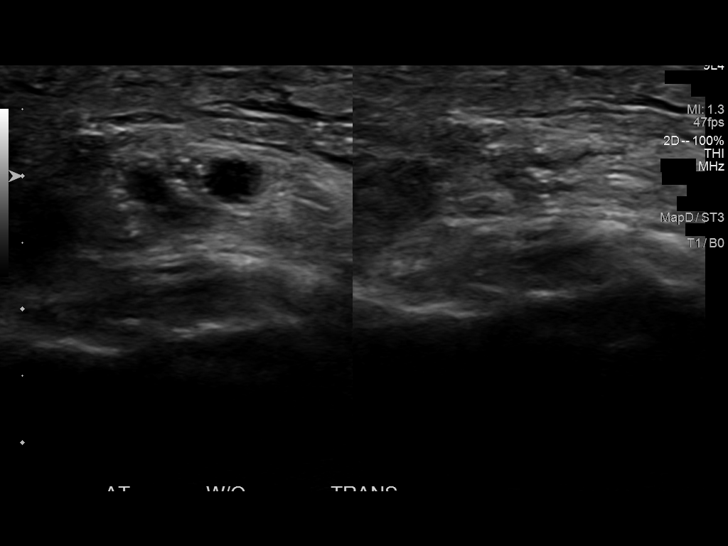
[im 62/62]
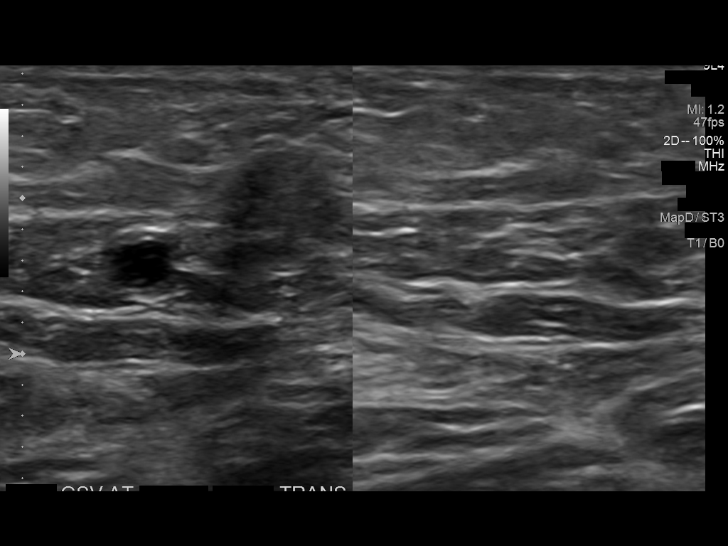

[13 of 24 positions shown; findings below may reference images not displayed]

FINDINGS: RIGHT LOWER EXTREMITY

Common Femoral Vein: No evidence of thrombus. Normal
compressibility, respiratory phasicity and response to augmentation.

Saphenofemoral Junction: No evidence of thrombus. Normal
compressibility and flow on color Doppler imaging.

Profunda Femoral Vein: No evidence of thrombus. Normal
compressibility and flow on color Doppler imaging.

Femoral Vein: No evidence of thrombus. Normal compressibility,
respiratory phasicity and response to augmentation.

Popliteal Vein: No evidence of thrombus. Normal compressibility,
respiratory phasicity and response to augmentation.

Calf Veins: No evidence of thrombus. Normal compressibility and flow
on color Doppler imaging.

Superficial Great Saphenous Vein: No evidence of thrombus. Normal
compressibility and flow on color Doppler imaging.

Venous Reflux:  None.

Other Findings:  None.

LEFT LOWER EXTREMITY

Common Femoral Vein: No evidence of thrombus. Normal
compressibility, respiratory phasicity and response to augmentation.

Saphenofemoral Junction: No evidence of thrombus. Normal
compressibility and flow on color Doppler imaging.

Profunda Femoral Vein: No evidence of thrombus. Normal
compressibility and flow on color Doppler imaging.

Femoral Vein: No evidence of thrombus. Normal compressibility,
respiratory phasicity and response to augmentation.

Popliteal Vein: No evidence of thrombus. Normal compressibility,
respiratory phasicity and response to augmentation.

Calf Veins: No evidence of thrombus. Normal compressibility and flow
on color Doppler imaging.

Superficial Great Saphenous Vein: No evidence of thrombus. Normal
compressibility and flow on color Doppler imaging.

Venous Reflux:  None.

Other Findings:  None.
IMPRESSION: No evidence of deep venous thrombosis.

## 2017-03-12 ENCOUNTER — Ambulatory Visit (INDEPENDENT_AMBULATORY_CARE_PROVIDER_SITE_OTHER): Payer: Medicare Other | Admitting: *Deleted

## 2017-03-12 ENCOUNTER — Telehealth: Payer: Self-pay | Admitting: Cardiology

## 2017-03-12 DIAGNOSIS — I442 Atrioventricular block, complete: Secondary | ICD-10-CM | POA: Diagnosis not present

## 2017-03-12 NOTE — Telephone Encounter (Signed)
Spoke with pt and reminded pt of remote transmission that is due today. Pt verbalized understanding.   

## 2017-03-15 ENCOUNTER — Encounter: Payer: Self-pay | Admitting: Cardiology

## 2017-03-15 NOTE — Progress Notes (Signed)
Remote pacemaker transmission.   

## 2017-03-27 ENCOUNTER — Other Ambulatory Visit: Payer: Self-pay | Admitting: Cardiology

## 2017-03-29 NOTE — Telephone Encounter (Signed)
Rx(s) sent to pharmacy electronically.  

## 2017-03-30 LAB — CUP PACEART REMOTE DEVICE CHECK
Battery Remaining Longevity: 72 mo
Battery Voltage: 3 V
Brady Statistic AP VS Percent: 0.17 %
Brady Statistic AS VS Percent: 5.39 %
Brady Statistic RV Percent Paced: 94.22 %
Implantable Lead Implant Date: 20160622
Implantable Lead Location: 753859
Implantable Lead Model: 5076
Lead Channel Impedance Value: 323 Ohm
Lead Channel Impedance Value: 418 Ohm
Lead Channel Impedance Value: 456 Ohm
Lead Channel Pacing Threshold Amplitude: 0.5 V
Lead Channel Pacing Threshold Amplitude: 0.625 V
Lead Channel Pacing Threshold Pulse Width: 0.4 ms
Lead Channel Sensing Intrinsic Amplitude: 12.875 mV
Lead Channel Sensing Intrinsic Amplitude: 2.75 mV
Lead Channel Sensing Intrinsic Amplitude: 2.75 mV
Lead Channel Setting Pacing Amplitude: 2.5 V
MDC IDC LEAD IMPLANT DT: 20160622
MDC IDC LEAD LOCATION: 753860
MDC IDC MSMT LEADCHNL RA PACING THRESHOLD PULSEWIDTH: 0.4 ms
MDC IDC MSMT LEADCHNL RV IMPEDANCE VALUE: 380 Ohm
MDC IDC MSMT LEADCHNL RV SENSING INTR AMPL: 12.875 mV
MDC IDC PG IMPLANT DT: 20160622
MDC IDC SESS DTM: 20181228194246
MDC IDC SET LEADCHNL RA PACING AMPLITUDE: 2 V
MDC IDC SET LEADCHNL RV PACING PULSEWIDTH: 0.4 ms
MDC IDC SET LEADCHNL RV SENSING SENSITIVITY: 4 mV
MDC IDC STAT BRADY AP VP PERCENT: 22.08 %
MDC IDC STAT BRADY AS VP PERCENT: 72.36 %
MDC IDC STAT BRADY RA PERCENT PACED: 19.82 %

## 2017-04-05 ENCOUNTER — Other Ambulatory Visit: Payer: Self-pay | Admitting: Internal Medicine

## 2017-04-05 DIAGNOSIS — D568 Other thalassemias: Secondary | ICD-10-CM

## 2017-04-25 ENCOUNTER — Other Ambulatory Visit: Payer: Self-pay | Admitting: Internal Medicine

## 2017-04-26 NOTE — Telephone Encounter (Signed)
Xarelto 20mg  refill request received; pt is 63 ys old, Wt-112.9kg, Crea-1.0 via scanned labs from PCP at [facility] office on 09/04/16, last seen by Dr. Percival Spanish on 03/04/17, CrCl-92.35ml/min. Will send in refill to requested pharmacy.

## 2017-06-15 ENCOUNTER — Ambulatory Visit (INDEPENDENT_AMBULATORY_CARE_PROVIDER_SITE_OTHER): Payer: Medicare Other | Admitting: *Deleted

## 2017-06-15 DIAGNOSIS — I442 Atrioventricular block, complete: Secondary | ICD-10-CM | POA: Diagnosis not present

## 2017-06-15 NOTE — Progress Notes (Signed)
Remote pacemaker transmission.   

## 2017-06-16 ENCOUNTER — Encounter: Payer: Self-pay | Admitting: Cardiology

## 2017-06-22 LAB — CUP PACEART REMOTE DEVICE CHECK
Brady Statistic AP VP Percent: 0 %
Brady Statistic AP VS Percent: 0 %
Brady Statistic AS VS Percent: 3.94 %
Brady Statistic RV Percent Paced: 96.14 %
Date Time Interrogation Session: 20190402134008
Implantable Lead Implant Date: 20160622
Implantable Lead Location: 753859
Implantable Lead Location: 753860
Implantable Lead Model: 5076
Lead Channel Impedance Value: 304 Ohm
Lead Channel Impedance Value: 323 Ohm
Lead Channel Impedance Value: 399 Ohm
Lead Channel Impedance Value: 418 Ohm
Lead Channel Pacing Threshold Amplitude: 0.75 V
Lead Channel Pacing Threshold Pulse Width: 0.4 ms
Lead Channel Pacing Threshold Pulse Width: 0.4 ms
Lead Channel Sensing Intrinsic Amplitude: 13.875 mV
Lead Channel Sensing Intrinsic Amplitude: 3.875 mV
Lead Channel Setting Pacing Amplitude: 2.5 V
Lead Channel Setting Sensing Sensitivity: 4 mV
MDC IDC LEAD IMPLANT DT: 20160622
MDC IDC MSMT BATTERY REMAINING LONGEVITY: 64 mo
MDC IDC MSMT BATTERY VOLTAGE: 3 V
MDC IDC MSMT LEADCHNL RA PACING THRESHOLD AMPLITUDE: 0.625 V
MDC IDC MSMT LEADCHNL RA SENSING INTR AMPL: 3.875 mV
MDC IDC MSMT LEADCHNL RV SENSING INTR AMPL: 13.875 mV
MDC IDC PG IMPLANT DT: 20160622
MDC IDC SET LEADCHNL RA PACING AMPLITUDE: 2 V
MDC IDC SET LEADCHNL RV PACING PULSEWIDTH: 0.4 ms
MDC IDC STAT BRADY AS VP PERCENT: 96.06 %
MDC IDC STAT BRADY RA PERCENT PACED: 0 %

## 2017-07-19 ENCOUNTER — Other Ambulatory Visit: Payer: Self-pay | Admitting: Internal Medicine

## 2017-07-19 DIAGNOSIS — D568 Other thalassemias: Secondary | ICD-10-CM

## 2017-08-10 ENCOUNTER — Inpatient Hospital Stay: Payer: Medicare Other | Attending: Internal Medicine

## 2017-08-10 DIAGNOSIS — D561 Beta thalassemia: Secondary | ICD-10-CM | POA: Insufficient documentation

## 2017-08-10 DIAGNOSIS — D508 Other iron deficiency anemias: Secondary | ICD-10-CM | POA: Diagnosis present

## 2017-08-10 DIAGNOSIS — D568 Other thalassemias: Secondary | ICD-10-CM

## 2017-08-10 LAB — CBC WITH DIFFERENTIAL/PLATELET
BASOS PCT: 1 %
Basophils Absolute: 0.1 10*3/uL (ref 0.0–0.1)
EOS ABS: 0.2 10*3/uL (ref 0.0–0.5)
Eosinophils Relative: 4 %
HCT: 36.1 % — ABNORMAL LOW (ref 38.4–49.9)
Hemoglobin: 11.7 g/dL — ABNORMAL LOW (ref 13.0–17.1)
Lymphocytes Relative: 32 %
Lymphs Abs: 1.7 10*3/uL (ref 0.9–3.3)
MCH: 20.7 pg — ABNORMAL LOW (ref 27.2–33.4)
MCHC: 32.4 g/dL (ref 32.0–36.0)
MCV: 63.8 fL — ABNORMAL LOW (ref 79.3–98.0)
MONO ABS: 0.6 10*3/uL (ref 0.1–0.9)
MONOS PCT: 12 %
Neutro Abs: 2.6 10*3/uL (ref 1.5–6.5)
Neutrophils Relative %: 51 %
Platelets: 142 10*3/uL (ref 140–400)
RBC: 5.66 MIL/uL (ref 4.20–5.82)
RDW: 16.7 % — AB (ref 11.0–14.6)
WBC: 5.2 10*3/uL (ref 4.0–10.3)

## 2017-08-10 LAB — IRON AND TIBC
IRON: 126 ug/dL (ref 42–163)
SATURATION RATIOS: 32 % — AB (ref 42–163)
TIBC: 398 ug/dL (ref 202–409)
UIBC: 272 ug/dL

## 2017-08-10 LAB — FERRITIN: Ferritin: 24 ng/mL (ref 22–316)

## 2017-08-16 ENCOUNTER — Encounter: Payer: Self-pay | Admitting: Internal Medicine

## 2017-08-16 ENCOUNTER — Telehealth: Payer: Self-pay | Admitting: Internal Medicine

## 2017-08-16 ENCOUNTER — Inpatient Hospital Stay: Payer: Medicare Other | Attending: Internal Medicine | Admitting: Internal Medicine

## 2017-08-16 VITALS — BP 135/87 | HR 85 | Temp 97.9°F | Resp 18 | Ht 67.0 in | Wt 243.4 lb

## 2017-08-16 DIAGNOSIS — Z79899 Other long term (current) drug therapy: Secondary | ICD-10-CM | POA: Diagnosis not present

## 2017-08-16 DIAGNOSIS — Z9841 Cataract extraction status, right eye: Secondary | ICD-10-CM | POA: Insufficient documentation

## 2017-08-16 DIAGNOSIS — Z8546 Personal history of malignant neoplasm of prostate: Secondary | ICD-10-CM

## 2017-08-16 DIAGNOSIS — N400 Enlarged prostate without lower urinary tract symptoms: Secondary | ICD-10-CM | POA: Insufficient documentation

## 2017-08-16 DIAGNOSIS — Z85828 Personal history of other malignant neoplasm of skin: Secondary | ICD-10-CM | POA: Diagnosis not present

## 2017-08-16 DIAGNOSIS — Z950 Presence of cardiac pacemaker: Secondary | ICD-10-CM | POA: Diagnosis not present

## 2017-08-16 DIAGNOSIS — K219 Gastro-esophageal reflux disease without esophagitis: Secondary | ICD-10-CM | POA: Insufficient documentation

## 2017-08-16 DIAGNOSIS — G4733 Obstructive sleep apnea (adult) (pediatric): Secondary | ICD-10-CM | POA: Diagnosis not present

## 2017-08-16 DIAGNOSIS — D500 Iron deficiency anemia secondary to blood loss (chronic): Secondary | ICD-10-CM

## 2017-08-16 DIAGNOSIS — D563 Thalassemia minor: Secondary | ICD-10-CM | POA: Diagnosis not present

## 2017-08-16 DIAGNOSIS — M1990 Unspecified osteoarthritis, unspecified site: Secondary | ICD-10-CM | POA: Insufficient documentation

## 2017-08-16 DIAGNOSIS — D509 Iron deficiency anemia, unspecified: Secondary | ICD-10-CM

## 2017-08-16 DIAGNOSIS — M109 Gout, unspecified: Secondary | ICD-10-CM | POA: Insufficient documentation

## 2017-08-16 DIAGNOSIS — Z87442 Personal history of urinary calculi: Secondary | ICD-10-CM | POA: Diagnosis not present

## 2017-08-16 NOTE — Telephone Encounter (Signed)
Appointments scheduled AVS/Calendar printed per 6/3 los °

## 2017-08-16 NOTE — Progress Notes (Signed)
[facility] Telephone:([phone]   Fax:(336) [phone]  OFFICE PROGRESS NOTE  [name], [name], MD [place] [address]  DIAGNOSIS: History of beta thalassemia minor and iron deficiency anemia  PRIOR THERAPY: Status post Feraheme infusion 510 mg IV last dose was given on 03/01/2015.  CURRENT THERAPY: Integra plus 1 capsule by mouth daily.  INTERVAL HISTORY: Jacob [name] 84 y.o. male returns to the clinic today for follow-up visit.  The patient is feeling fine today with no concerning complaints.  He denied having any significant fatigue.  He denied having any dizzy spells.  He has no nausea, vomiting, diarrhea or constipation.  He continues to have few ecchymosis on his upper extremities secondary to his treatment with Xarelto.  He denied having any fever or chills.  He has no significant weight loss or night sweats.  He has no chest pain, shortness breath, cough or hemoptysis.  He had repeat CBC and iron study performed recently and he is here for evaluation and discussion of his lab results.  MEDICAL HISTORY: Past Medical History:  Diagnosis Date  . Anemia   . Arthritis    "middle finger both hands" (09/05/2014)  . Basal cell carcinoma   . Benign prostatic hypertrophy    hx of  . Colon polyp 2006, 2010   TUBULAR ADENOMA  . Diverticulosis of colon (without mention of hemorrhage)   . First degree atrioventricular block   . GERD (gastroesophageal reflux disease)   . Gout   . History of blood transfusion 12/1953; 03/1954   Transfused 7 pints blood '55, 4 pints '56  . History of duodenal ulcer 1955   w/hemorrhage  . Kidney stones    "passed them"  . Morbid obesity ([place])    "I was 294; lost 100# 2014-2015"  . MVP (mitral valve prolapse)   . OSA (obstructive sleep apnea)    "stopped wearing mask after I lost 100#"  . Osteoarthritis   . Other thalassemia ([place])   . Pneumonia "a number of times"  . Presence of permanent cardiac  pacemaker   . Prostate cancer ([place])     ALLERGIES:  is allergic to prednisone and sulfonamide derivatives.  MEDICATIONS:  Current Outpatient Medications  Medication Sig Dispense Refill  . acetaminophen (TYLENOL) 500 MG tablet Take 500 mg by mouth every 6 (six) hours as needed (pain).     Marland Kitchen allopurinol (ZYLOPRIM) 300 MG tablet Take 1 tablet (300 mg total) by mouth daily. 90 tablet 3  . Ascorbic Acid (VITAMIN C PO) Take 1 capsule by mouth daily as needed (supplement).    Marland Kitchen b complex vitamins capsule Take 1 capsule by mouth daily.    . Cholecalciferol (VITAMIN D3) 2000 UNITS capsule Take 1 capsule (2,000 Units total) by mouth daily.    . Coenzyme Q10 300 MG CAPS Take 300 mg by mouth daily.    . colchicine 0.6 MG tablet Take 0.6 mg by mouth daily as needed (gout).     . Cyanocobalamin (VITAMIN B 12 PO) Take 1,000 mg by mouth daily.     Marland Kitchen FeFum-FePoly-FA-B Cmp-C-Biot (INTEGRA PLUS) CAPS TAKE 1 CAPSULE BY MOUTH EVERY MORNING 90 capsule 0  . fexofenadine (ALLEGRA) 180 MG tablet Take 180 mg by mouth daily.     . furosemide (LASIX) 40 MG tablet Take 1 tablet (40 mg total) by mouth daily. 90 tablet 3  . Omega-3 Fatty Acids (FISH OIL) 600 MG CAPS Take 600 mg by mouth  daily.     . tamsulosin (FLOMAX) 0.4 MG CAPS capsule Take 0.4 mg by mouth daily.  11  . XARELTO 20 MG TABS tablet TAKE 1 TABLET(20 MG) BY MOUTH DAILY WITH SUPPER 30 tablet 5   No current facility-administered medications for this visit.     SURGICAL HISTORY:  Past Surgical History:  Procedure Laterality Date  . CATARACT EXTRACTION W/ INTRAOCULAR LENS  IMPLANT, BILATERAL  2002-2004  . CLOSED REDUCTION ANKLE DISLOCATION Right 1980's   "toe my achilles"  . COLONOSCOPY  2015   multiple   . EP IMPLANTABLE DEVICE N/A 09/05/2014   Procedure: Pacemaker Implant;  Surgeon: [name], MD;  Location: [facility];  Service: Cardiovascular;  Laterality: N/A;  . ESOPHAGOGASTRODUODENOSCOPY  2011   multiple  . GASTRECTOMY  03/1954     Transfused 7 pints blood '55, 4 pints '56  . GASTRECTOMY  1956   partial  . INSERT / REPLACE / REMOVE PACEMAKER  09/05/2014  . MOLE REMOVAL Left 2008   "forearm"  . TUMOR REMOVAL Left 2002   from finger- left index    REVIEW OF SYSTEMS:  A comprehensive review of systems was negative.   PHYSICAL EXAMINATION: General appearance: alert, cooperative and no distress Head: Normocephalic, without obvious abnormality, atraumatic Neck: no adenopathy, no JVD, supple, symmetrical, trachea midline and thyroid not enlarged, symmetric, no tenderness/mass/nodules Lymph nodes: Cervical, supraclavicular, and axillary nodes normal. Resp: clear to auscultation bilaterally Back: symmetric, no curvature. ROM normal. No CVA tenderness. Cardio: regular rate and rhythm, S1, S2 normal, no murmur, click, rub or gallop GI: soft, non-tender; bowel sounds normal; no masses,  no organomegaly Extremities: extremities normal, atraumatic, no cyanosis or edema  ECOG PERFORMANCE STATUS: 0 - Asymptomatic  Blood pressure 135/87, pulse 85, temperature 97.9 F (36.6 C), temperature source Oral, resp. rate 18, height 5\' 7"  (1.702 m), weight 243 lb 6.4 oz (110.4 kg), SpO2 98 %.  LABORATORY DATA: Lab Results  Component Value Date   WBC 5.2 08/10/2017   HGB 11.7 (L) 08/10/2017   HCT 36.1 (L) 08/10/2017   MCV 63.8 (L) 08/10/2017   PLT 142 08/10/2017      Chemistry      Component Value Date/Time   NA 138 08/27/2014 0916   NA 141 12/12/2012 1522   K 4.6 08/27/2014 0916   K 4.2 12/12/2012 1522   CL 103 08/27/2014 0916   CO2 27 08/27/2014 0916   CO2 24 12/12/2012 1522   BUN 32 (H) 08/27/2014 0916   BUN 22.4 12/12/2012 1522   CREATININE 0.96 08/27/2014 0916   CREATININE 1.1 12/12/2012 1522      Component Value Date/Time   CALCIUM 9.6 08/27/2014 0916   CALCIUM 9.7 12/12/2012 1522   ALKPHOS 70 05/03/2013 1300   ALKPHOS 86 12/12/2012 1522   AST 26 05/03/2013 1300   AST 21 12/12/2012 1522   ALT 20  05/03/2013 1300   ALT 19 12/12/2012 1522   BILITOT 1.9 (H) 05/03/2013 1300   BILITOT 0.96 12/12/2012 1522     Other lab results: Ferritin 24, serum iron 126, total iron binding capacity 398 and iron saturation 32%.  RADIOGRAPHIC STUDIES: No results found.  ASSESSMENT AND PLAN:  This is a very pleasant 84 years old white male with beta thalassemia minor as well as iron deficiency anemia. He is currently on oral iron tablets with Integra plus 1 capsule by mouth daily.  The patient continues to tolerate this treatment well.  Repeat CBC, iron study  and ferritin is unremarkable except for the mild anemia. I discussed the lab results with the patient and recommended for him to continue his current treatment with Integra plus on daily basis. I will see him back for follow-up visit in 6 months for evaluation with repeat CBC, iron study and ferritin. He was advised to call immediately if he has any concerning symptoms in the interval. The patient voices understanding of current disease status and treatment options and is in agreement with the current care plan. All questions were answered. The patient knows to call the clinic with any problems, questions or concerns. We can certainly see the patient much sooner if necessary.  Disclaimer: This note was dictated with voice recognition software. Similar sounding words can inadvertently be transcribed and may not be corrected upon review.

## 2017-08-26 ENCOUNTER — Ambulatory Visit (INDEPENDENT_AMBULATORY_CARE_PROVIDER_SITE_OTHER): Payer: Medicare Other | Admitting: Internal Medicine

## 2017-08-26 ENCOUNTER — Encounter: Payer: Self-pay | Admitting: Internal Medicine

## 2017-08-26 VITALS — BP 124/72 | HR 64 | Ht 67.0 in | Wt 244.0 lb

## 2017-08-26 DIAGNOSIS — I442 Atrioventricular block, complete: Secondary | ICD-10-CM

## 2017-08-26 DIAGNOSIS — I480 Paroxysmal atrial fibrillation: Secondary | ICD-10-CM

## 2017-08-26 DIAGNOSIS — Z950 Presence of cardiac pacemaker: Secondary | ICD-10-CM | POA: Diagnosis not present

## 2017-08-26 MED ORDER — RIVAROXABAN 20 MG PO TABS: ORAL_TABLET | ORAL | 3 refills | Status: DC

## 2017-08-26 NOTE — Progress Notes (Addendum)
HPI [name] returns today for followup. He is a 84 yo man who underwent PPM insertion several years ago. He has not had syncope. No sob. No fever or chills. He has gained 50 lbs in the past 3 years. He admits to dietary indiscretion. He does not feel his atrial fib. He is exercising regularly and his energy level has improved.  Allergies  Allergen Reactions  . Prednisone     REACTION: GI bleed  . Sulfonamide Derivatives     REACTION: Rash     Current Outpatient Medications  Medication Sig Dispense Refill  . acetaminophen (TYLENOL) 500 MG tablet Take 500 mg by mouth every 6 (six) hours as needed (pain).     Marland Kitchen allopurinol (ZYLOPRIM) 300 MG tablet Take 1 tablet (300 mg total) by mouth daily. 90 tablet 3  . Ascorbic Acid (VITAMIN C PO) Take 1 capsule by mouth daily as needed (supplement).    Marland Kitchen b complex vitamins capsule Take 1 capsule by mouth daily.    . Cholecalciferol (VITAMIN D3) 2000 UNITS capsule Take 1 capsule (2,000 Units total) by mouth daily.    . Coenzyme Q10 300 MG CAPS Take 300 mg by mouth daily.    . colchicine 0.6 MG tablet Take 0.6 mg by mouth daily as needed (gout).     . Cyanocobalamin (VITAMIN B 12 PO) Take 1,000 mg by mouth daily.     Marland Kitchen FeFum-FePoly-FA-B Cmp-C-Biot (INTEGRA PLUS) CAPS TAKE 1 CAPSULE BY MOUTH EVERY MORNING 90 capsule 0  . fexofenadine (ALLEGRA) 180 MG tablet Take 180 mg by mouth daily.     . furosemide (LASIX) 40 MG tablet Take 1 tablet (40 mg total) by mouth daily. 90 tablet 3  . Omega-3 Fatty Acids (FISH OIL) 600 MG CAPS Take 600 mg by mouth daily.     . tamsulosin (FLOMAX) 0.4 MG CAPS capsule Take 0.4 mg by mouth daily.  11  . XARELTO 20 MG TABS tablet TAKE 1 TABLET(20 MG) BY MOUTH DAILY WITH SUPPER 30 tablet 5   No current facility-administered medications for this visit.      Past Medical History:  Diagnosis Date  . Anemia   . Arthritis    "middle finger both hands" (09/05/2014)  . Basal cell carcinoma   . Benign prostatic  hypertrophy    hx of  . Colon polyp 2006, 2010   TUBULAR ADENOMA  . Diverticulosis of colon (without mention of hemorrhage)   . First degree atrioventricular block   . GERD (gastroesophageal reflux disease)   . Gout   . History of blood transfusion 12/1953; 03/1954   Transfused 7 pints blood '55, 4 pints '56  . History of duodenal ulcer 1955   w/hemorrhage  . Kidney stones    "passed them"  . Morbid obesity ([place])    "I was 294; lost 100# 2014-2015"  . MVP (mitral valve prolapse)   . OSA (obstructive sleep apnea)    "stopped wearing mask after I lost 100#"  . Osteoarthritis   . Other thalassemia ([place])   . Pneumonia "a number of times"  . Presence of permanent cardiac pacemaker   . Prostate cancer ([place])     ROS:   All systems reviewed and negative except as noted in the HPI.   Past Surgical History:  Procedure Laterality Date  . CATARACT EXTRACTION W/ INTRAOCULAR LENS  IMPLANT, BILATERAL  2002-2004  . CLOSED REDUCTION ANKLE DISLOCATION Right 1980's   "toe my achilles"  . COLONOSCOPY  2015   multiple   . EP IMPLANTABLE DEVICE N/A 09/05/2014   Procedure: Pacemaker Implant;  Surgeon: [name], MD;  Location: [facility];  Service: Cardiovascular;  Laterality: N/A;  . ESOPHAGOGASTRODUODENOSCOPY  2011   multiple  . GASTRECTOMY  03/1954   Transfused 7 pints blood '55, 4 pints '56  . GASTRECTOMY  1956   partial  . INSERT / REPLACE / REMOVE PACEMAKER  09/05/2014  . MOLE REMOVAL Left 2008   "forearm"  . TUMOR REMOVAL Left 2002   from finger- left index     Family History  Problem Relation Age of Onset  . Coronary artery disease Father   . Heart attack Father   . Gout Father   . Cancer Daughter        skin  . Colon cancer Neg Hx      Social History   Socioeconomic History  . Marital status: Married    Spouse name: ANn  . Number of children: 8  . Years of education: 18+  . Highest education level: Not on file  Occupational History  . Occupation:  executive    Comment: retired  Scientific laboratory technician  . Financial resource strain: Not on file  . Food insecurity:    Worry: Not on file    Inability: Not on file  . Transportation needs:    Medical: Not on file    Non-medical: Not on file  Tobacco Use  . Smoking status: Former Smoker    Packs/day: 3.00    Years: 20.00    Pack years: 60.00    Types: Cigarettes    Start date: 01/21/1970  . Smokeless tobacco: Never Used  . Tobacco comment: quite smoking cigarettes in 1971  Substance and Sexual Activity  . Alcohol use: No    Comment: "no alcohol since 1987"  . Drug use: No  . Sexual activity: Yes    Partners: Female  Lifestyle  . Physical activity:    Days per week: Not on file    Minutes per session: Not on file  . Stress: Not on file  Relationships  . Social connections:    Talks on phone: Not on file    Gets together: Not on file    Attends religious service: Not on file    Active member of club or organization: Not on file    Attends meetings of clubs or organizations: Not on file    Relationship status: Not on file  . Intimate partner violence:    Fear of current or ex partner: Not on file    Emotionally abused: Not on file    Physically abused: Not on file    Forced sexual activity: Not on file  Other Topics Concern  . 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 Married '59. Eight Daughters '60, '62, '66, '70, '73, '74, '78, '83; 19 grandchildren. Lives independently with wife. ACP- asked pt to think about these issues (DEc '12)           BP 124/72   Pulse 64   Ht 5\' 7"  (1.702 m)   Wt 244 lb (110.7 kg)   SpO2 92%   BMI 38.22 kg/m   Physical Exam:  Well appearing 84 yo man, NAD HEENT: Unremarkable Neck:  6 cm JVD, no thyromegally Lymphatics:  No adenopathy Back:  No CVA tenderness Lungs:  Clear with no wheezes HEART:  Regular rate rhythm, no murmurs, no rubs,  no clicks Abd:  soft, positive bowel sounds, no  organomegally, no rebound, no guarding Ext:  2 plus pulses, no edema, no cyanosis, no clubbing Skin:  No rashes no nodules Neuro:  CN II through XII intact, motor grossly intact   DEVICE  Normal device function.  See PaceArt for details.   Assess/Plan: 1. chb - he is asymptomatic, s/p PPM insertion 2. PPM - his medtronic DDD PM is workingn normally.  3. Obesity - he has gained weight though a lot is muscle. I have discussed his diet and made recommendations about not missing meals, eating more protein and complex carbs and veggies. 4. Atrial fib - his ventricular rate is well controlled. He is asymptomatic. He is tolerating his Xarelto without problem.  Mikle Bosworth.D.

## 2017-08-26 NOTE — Patient Instructions (Signed)

## 2017-08-30 LAB — CUP PACEART INCLINIC DEVICE CHECK
Battery Voltage: 2.99 V
Brady Statistic AP VP Percent: 26.79 %
Brady Statistic AP VS Percent: 0.18 %
Brady Statistic AS VS Percent: 3.32 %
Implantable Lead Implant Date: 20160622
Implantable Lead Location: 753859
Implantable Lead Model: 5076
Implantable Pulse Generator Implant Date: 20160622
Lead Channel Impedance Value: 304 Ohm
Lead Channel Pacing Threshold Amplitude: 0.625 V
Lead Channel Pacing Threshold Pulse Width: 0.4 ms
Lead Channel Sensing Intrinsic Amplitude: 0.625 mV
Lead Channel Sensing Intrinsic Amplitude: 12.125 mV
Lead Channel Setting Pacing Amplitude: 2 V
Lead Channel Setting Pacing Amplitude: 2.5 V
Lead Channel Setting Pacing Pulse Width: 0.4 ms
MDC IDC LEAD IMPLANT DT: 20160622
MDC IDC LEAD LOCATION: 753860
MDC IDC MSMT BATTERY REMAINING LONGEVITY: 61 mo
MDC IDC MSMT LEADCHNL RA IMPEDANCE VALUE: 418 Ohm
MDC IDC MSMT LEADCHNL RA SENSING INTR AMPL: 1 mV
MDC IDC MSMT LEADCHNL RV IMPEDANCE VALUE: 342 Ohm
MDC IDC MSMT LEADCHNL RV IMPEDANCE VALUE: 418 Ohm
MDC IDC MSMT LEADCHNL RV SENSING INTR AMPL: 11.875 mV
MDC IDC SESS DTM: 20190613194746
MDC IDC SET LEADCHNL RV SENSING SENSITIVITY: 4 mV
MDC IDC STAT BRADY AS VP PERCENT: 69.71 %
MDC IDC STAT BRADY RA PERCENT PACED: 21.95 %
MDC IDC STAT BRADY RV PERCENT PACED: 96.29 %

## 2017-09-14 ENCOUNTER — Encounter: Payer: Self-pay | Admitting: Internal Medicine

## 2017-09-14 ENCOUNTER — Encounter: Payer: Medicare Other | Admitting: *Deleted

## 2017-09-14 NOTE — Telephone Encounter (Signed)
Called and spoke w/ pt and he agreed to come into the office today at 10:30 - 11:00 AM to get help with his home monitor.

## 2017-10-25 ENCOUNTER — Other Ambulatory Visit: Payer: Self-pay | Admitting: Internal Medicine

## 2017-10-25 DIAGNOSIS — D568 Other thalassemias: Secondary | ICD-10-CM

## 2017-10-28 ENCOUNTER — Encounter: Payer: Self-pay | Admitting: Cardiology

## 2017-11-02 ENCOUNTER — Ambulatory Visit (INDEPENDENT_AMBULATORY_CARE_PROVIDER_SITE_OTHER): Payer: Medicare Other | Admitting: *Deleted

## 2017-11-02 DIAGNOSIS — I441 Atrioventricular block, second degree: Secondary | ICD-10-CM | POA: Diagnosis not present

## 2017-11-02 NOTE — Progress Notes (Signed)
Remote pacemaker transmission.   

## 2017-12-06 LAB — CUP PACEART REMOTE DEVICE CHECK
Battery Remaining Longevity: 56 mo
Brady Statistic AP VS Percent: 0 %
Brady Statistic AS VP Percent: 97.44 %
Brady Statistic AS VS Percent: 2.31 %
Brady Statistic RA Percent Paced: 0.14 %
Brady Statistic RV Percent Paced: 97.86 %
Date Time Interrogation Session: 20190820121936
Implantable Lead Implant Date: 20160622
Implantable Lead Implant Date: 20160622
Implantable Lead Location: 753859
Implantable Lead Location: 753860
Implantable Lead Model: 5076
Implantable Lead Model: 5076
Lead Channel Impedance Value: 304 Ohm
Lead Channel Pacing Threshold Amplitude: 0.75 V
Lead Channel Pacing Threshold Pulse Width: 0.4 ms
Lead Channel Pacing Threshold Pulse Width: 0.4 ms
Lead Channel Sensing Intrinsic Amplitude: 1.875 mV
Lead Channel Sensing Intrinsic Amplitude: 1.875 mV
Lead Channel Sensing Intrinsic Amplitude: 13.875 mV
Lead Channel Sensing Intrinsic Amplitude: 13.875 mV
Lead Channel Setting Pacing Amplitude: 2.5 V
Lead Channel Setting Pacing Pulse Width: 0.4 ms
Lead Channel Setting Sensing Sensitivity: 4 mV
MDC IDC MSMT BATTERY VOLTAGE: 2.99 V
MDC IDC MSMT LEADCHNL RA IMPEDANCE VALUE: 437 Ohm
MDC IDC MSMT LEADCHNL RA PACING THRESHOLD AMPLITUDE: 0.625 V
MDC IDC MSMT LEADCHNL RV IMPEDANCE VALUE: 342 Ohm
MDC IDC MSMT LEADCHNL RV IMPEDANCE VALUE: 437 Ohm
MDC IDC PG IMPLANT DT: 20160622
MDC IDC SET LEADCHNL RA PACING AMPLITUDE: 2 V
MDC IDC STAT BRADY AP VP PERCENT: 0.24 %

## 2018-01-31 ENCOUNTER — Ambulatory Visit: Payer: Medicare Other | Admitting: Adult Health

## 2018-01-31 ENCOUNTER — Ambulatory Visit: Payer: Medicare Other | Admitting: Primary Care

## 2018-02-01 ENCOUNTER — Telehealth: Payer: Self-pay

## 2018-02-01 ENCOUNTER — Ambulatory Visit (INDEPENDENT_AMBULATORY_CARE_PROVIDER_SITE_OTHER): Payer: Medicare Other

## 2018-02-01 DIAGNOSIS — I441 Atrioventricular block, second degree: Secondary | ICD-10-CM | POA: Diagnosis not present

## 2018-02-01 NOTE — Telephone Encounter (Signed)
Spoke with pt and reminded pt of remote transmission that is due today. Pt verbalized understanding.   

## 2018-02-02 NOTE — Progress Notes (Signed)
Remote pacemaker transmission.   

## 2018-02-07 ENCOUNTER — Other Ambulatory Visit: Payer: Self-pay | Admitting: Internal Medicine

## 2018-02-07 DIAGNOSIS — D568 Other thalassemias: Secondary | ICD-10-CM

## 2018-02-08 ENCOUNTER — Ambulatory Visit: Payer: Medicare Other | Admitting: Primary Care

## 2018-02-09 ENCOUNTER — Ambulatory Visit: Payer: Medicare Other | Admitting: Adult Health

## 2018-02-15 ENCOUNTER — Telehealth: Payer: Self-pay | Admitting: Internal Medicine

## 2018-02-15 ENCOUNTER — Inpatient Hospital Stay: Payer: Medicare Other | Attending: Internal Medicine

## 2018-02-15 DIAGNOSIS — Z8546 Personal history of malignant neoplasm of prostate: Secondary | ICD-10-CM | POA: Diagnosis not present

## 2018-02-15 DIAGNOSIS — D509 Iron deficiency anemia, unspecified: Secondary | ICD-10-CM | POA: Diagnosis not present

## 2018-02-15 DIAGNOSIS — D563 Thalassemia minor: Secondary | ICD-10-CM | POA: Diagnosis not present

## 2018-02-15 DIAGNOSIS — D500 Iron deficiency anemia secondary to blood loss (chronic): Secondary | ICD-10-CM

## 2018-02-15 DIAGNOSIS — Z79899 Other long term (current) drug therapy: Secondary | ICD-10-CM | POA: Diagnosis not present

## 2018-02-15 LAB — CBC WITH DIFFERENTIAL (CANCER CENTER ONLY)
ABS IMMATURE GRANULOCYTES: 0.11 10*3/uL — AB (ref 0.00–0.07)
Basophils Absolute: 0 10*3/uL (ref 0.0–0.1)
Basophils Relative: 1 %
EOS PCT: 3 %
Eosinophils Absolute: 0.2 10*3/uL (ref 0.0–0.5)
HCT: 32.4 % — ABNORMAL LOW (ref 39.0–52.0)
HEMOGLOBIN: 10.3 g/dL — AB (ref 13.0–17.0)
Immature Granulocytes: 2 %
LYMPHS PCT: 27 %
Lymphs Abs: 1.6 10*3/uL (ref 0.7–4.0)
MCH: 20.3 pg — AB (ref 26.0–34.0)
MCHC: 31.8 g/dL (ref 30.0–36.0)
MCV: 63.8 fL — AB (ref 80.0–100.0)
MONO ABS: 0.6 10*3/uL (ref 0.1–1.0)
MONOS PCT: 9 %
NEUTROS ABS: 3.6 10*3/uL (ref 1.7–7.7)
Neutrophils Relative %: 58 %
Platelet Count: 135 10*3/uL — ABNORMAL LOW (ref 150–400)
RBC: 5.08 MIL/uL (ref 4.22–5.81)
RDW: 16.8 % — ABNORMAL HIGH (ref 11.5–15.5)
WBC Count: 6.1 10*3/uL (ref 4.0–10.5)
nRBC: 0.7 % — ABNORMAL HIGH (ref 0.0–0.2)

## 2018-02-15 LAB — IRON AND TIBC
IRON: 151 ug/dL (ref 42–163)
Saturation Ratios: 39 % (ref 20–55)
TIBC: 386 ug/dL (ref 202–409)
UIBC: 235 ug/dL (ref 117–376)

## 2018-02-15 LAB — FERRITIN: Ferritin: 30 ng/mL (ref 24–336)

## 2018-02-15 NOTE — Telephone Encounter (Signed)
MM PAL 12/10 - moved f/u to 12/13. Spoke with patient.

## 2018-02-22 ENCOUNTER — Ambulatory Visit: Payer: Medicare Other | Admitting: Internal Medicine

## 2018-02-25 ENCOUNTER — Encounter: Payer: Self-pay | Admitting: Internal Medicine

## 2018-02-25 ENCOUNTER — Inpatient Hospital Stay (HOSPITAL_BASED_OUTPATIENT_CLINIC_OR_DEPARTMENT_OTHER): Payer: Medicare Other | Admitting: Internal Medicine

## 2018-02-25 ENCOUNTER — Telehealth: Payer: Self-pay

## 2018-02-25 VITALS — BP 129/64 | HR 89 | Temp 99.0°F | Resp 19 | Wt 255.4 lb

## 2018-02-25 DIAGNOSIS — I10 Essential (primary) hypertension: Secondary | ICD-10-CM

## 2018-02-25 DIAGNOSIS — Z79899 Other long term (current) drug therapy: Secondary | ICD-10-CM

## 2018-02-25 DIAGNOSIS — D509 Iron deficiency anemia, unspecified: Secondary | ICD-10-CM | POA: Diagnosis not present

## 2018-02-25 DIAGNOSIS — Z8546 Personal history of malignant neoplasm of prostate: Secondary | ICD-10-CM | POA: Diagnosis not present

## 2018-02-25 DIAGNOSIS — D563 Thalassemia minor: Secondary | ICD-10-CM | POA: Diagnosis not present

## 2018-02-25 DIAGNOSIS — D508 Other iron deficiency anemias: Secondary | ICD-10-CM

## 2018-02-25 DIAGNOSIS — D568 Other thalassemias: Secondary | ICD-10-CM

## 2018-02-25 NOTE — Telephone Encounter (Signed)
Printed avs and calender of upcoming appointment. Per 12/13 los  

## 2018-02-25 NOTE — Progress Notes (Signed)
[facility] [phone]   [phone]  OFFICE PROGRESS NOTE  [name], [name], MD [place] [address]  DIAGNOSIS: History of beta thalassemia minor and iron deficiency anemia  PRIOR THERAPY: Status post Feraheme infusion 510 mg IV last dose was given on 03/01/2015.  CURRENT THERAPY: Integra plus 1 capsule by mouth daily.  INTERVAL HISTORY: Jacob [name] 84 y.o. male returns to the clinic today for 6 months follow-up visit.  The patient is feeling fine today with no concerning complaints.  He denied having any fatigue or weakness.  He denied having any chest pain but has shortness of breath with exertion with no cough or hemoptysis.  He has no fever or chills.  He has no significant weight loss or night sweats.  He has been tolerating his treatment with Integra plus fairly well.  The patient had repeat CBC, iron study and ferritin performed recently and he is here for evaluation and discussion of his lab results.  MEDICAL HISTORY: Past Medical History:  Diagnosis Date  . Anemia   . Arthritis    "middle finger both hands" (09/05/2014)  . Basal cell carcinoma   . Benign prostatic hypertrophy    hx of  . Colon polyp 2006, 2010   TUBULAR ADENOMA  . Diverticulosis of colon (without mention of hemorrhage)   . First degree atrioventricular block   . GERD (gastroesophageal reflux disease)   . Gout   . History of blood transfusion 12/1953; 03/1954   Transfused 7 pints blood '55, 4 pints '56  . History of duodenal ulcer 1955   w/hemorrhage  . Kidney stones    "passed them"  . Morbid obesity ([place])    "I was 294; lost 100# 2014-2015"  . MVP (mitral valve prolapse)   . OSA (obstructive sleep apnea)    "stopped wearing mask after I lost 100#"  . Osteoarthritis   . Other thalassemia ([place])   . Pneumonia "a number of times"  . Presence of permanent cardiac pacemaker   . Prostate cancer ([place])     ALLERGIES:  is allergic  to prednisone and sulfonamide derivatives.  MEDICATIONS:  Current Outpatient Medications  Medication Sig Dispense Refill  . acetaminophen (TYLENOL) 500 MG tablet Take 500 mg by mouth every 6 (six) hours as needed (pain).     Marland Kitchen allopurinol (ZYLOPRIM) 300 MG tablet Take 1 tablet (300 mg total) by mouth daily. 90 tablet 3  . Ascorbic Acid (VITAMIN C PO) Take 1 capsule by mouth daily as needed (supplement).    Marland Kitchen b complex vitamins capsule Take 1 capsule by mouth daily.    . Cholecalciferol (VITAMIN D3) 2000 UNITS capsule Take 1 capsule (2,000 Units total) by mouth daily.    . Coenzyme Q10 300 MG CAPS Take 300 mg by mouth daily.    . colchicine 0.6 MG tablet Take 0.6 mg by mouth daily as needed (gout).     . Cyanocobalamin (VITAMIN B 12 PO) Take 1,000 mg by mouth daily.     Marland Kitchen FeFum-FePoly-FA-B Cmp-C-Biot (INTEGRA PLUS) CAPS TAKE 1 CAPSULE BY MOUTH EVERY MORNING 90 capsule 0  . fexofenadine (ALLEGRA) 180 MG tablet Take 180 mg by mouth daily.     . furosemide (LASIX) 40 MG tablet Take 1 tablet (40 mg total) by mouth daily. 90 tablet 3  . Omega-3 Fatty Acids (FISH OIL) 600 MG CAPS Take 600 mg by mouth daily.     . rivaroxaban (XARELTO) 20  MG TABS tablet TAKE 1 TABLET(20 MG) BY MOUTH DAILY WITH SUPPER 90 tablet 3  . tamsulosin (FLOMAX) 0.4 MG CAPS capsule Take 0.4 mg by mouth daily.  11   No current facility-administered medications for this visit.     SURGICAL HISTORY:  Past Surgical History:  Procedure Laterality Date  . CATARACT EXTRACTION W/ INTRAOCULAR LENS  IMPLANT, BILATERAL  2002-2004  . CLOSED REDUCTION ANKLE DISLOCATION Right 1980's   "toe my achilles"  . COLONOSCOPY  2015   multiple   . EP IMPLANTABLE DEVICE N/A 09/05/2014   Procedure: Pacemaker Implant;  Surgeon: [name], MD;  Location: [facility];  Service: Cardiovascular;  Laterality: N/A;  . ESOPHAGOGASTRODUODENOSCOPY  2011   multiple  . GASTRECTOMY  03/1954   Transfused 7 pints blood '55, 4 pints '56  .  GASTRECTOMY  1956   partial  . INSERT / REPLACE / REMOVE PACEMAKER  09/05/2014  . MOLE REMOVAL Left 2008   "forearm"  . TUMOR REMOVAL Left 2002   from finger- left index    REVIEW OF SYSTEMS:  A comprehensive review of systems was negative.   PHYSICAL EXAMINATION: General appearance: alert, cooperative and no distress Head: Normocephalic, without obvious abnormality, atraumatic Neck: no adenopathy, no JVD, supple, symmetrical, trachea midline and thyroid not enlarged, symmetric, no tenderness/mass/nodules Lymph nodes: Cervical, supraclavicular, and axillary nodes normal. Resp: clear to auscultation bilaterally Back: symmetric, no curvature. ROM normal. No CVA tenderness. Cardio: regular rate and rhythm, S1, S2 normal, no murmur, click, rub or gallop GI: soft, non-tender; bowel sounds normal; no masses,  no organomegaly Extremities: extremities normal, atraumatic, no cyanosis or edema  ECOG PERFORMANCE STATUS: 0 - Asymptomatic  Blood pressure 129/64, pulse 89, temperature 99 F (37.2 C), temperature source Oral, resp. rate 19, weight 255 lb 6.4 oz (115.8 kg), SpO2 100 %.  LABORATORY DATA: Lab Results  Component Value Date   WBC 6.1 02/15/2018   HGB 10.3 (L) 02/15/2018   HCT 32.4 (L) 02/15/2018   MCV 63.8 (L) 02/15/2018   PLT 135 (L) 02/15/2018      Chemistry      Component Value Date/Time   NA 138 08/27/2014 0916   NA 141 12/12/2012 1522   K 4.6 08/27/2014 0916   K 4.2 12/12/2012 1522   CL 103 08/27/2014 0916   CO2 27 08/27/2014 0916   CO2 24 12/12/2012 1522   BUN 32 (H) 08/27/2014 0916   BUN 22.4 12/12/2012 1522   CREATININE 0.96 08/27/2014 0916   CREATININE 1.1 12/12/2012 1522      Component Value Date/Time   CALCIUM 9.6 08/27/2014 0916   CALCIUM 9.7 12/12/2012 1522   ALKPHOS 70 05/03/2013 1300   ALKPHOS 86 12/12/2012 1522   AST 26 05/03/2013 1300   AST 21 12/12/2012 1522   ALT 20 05/03/2013 1300   ALT 19 12/12/2012 1522   BILITOT 1.9 (H) 05/03/2013 1300    BILITOT 0.96 12/12/2012 1522     Other lab results: Ferritin 30, serum iron 151, total iron binding capacity 386 and iron saturation 39%.  RADIOGRAPHIC STUDIES: No results found.  ASSESSMENT AND PLAN:  This is a very pleasant 84 years old white male with beta thalassemia minor as well as iron deficiency anemia. He is currently on oral iron tablets with Integra plus 1 capsule by mouth daily.  The patient has been tolerating this treatment well with no concerning adverse effects. Repeat CBC showed persistent mild anemia secondary to his history of thalassemia minor  as well as iron deficiency. Iron study is unremarkable today. I recommended for the patient to continue his current treatment with Integra +1 capsule p.o. daily. I will see him back for follow-up visit in 6 months for evaluation with repeat CBC, iron study and ferritin. He was advised to call immediately if he has any concerning symptoms in the interval. The patient voices understanding of current disease status and treatment options and is in agreement with the current care plan. All questions were answered. The patient knows to call the clinic with any problems, questions or concerns. We can certainly see the patient much sooner if necessary.  Disclaimer: This note was dictated with voice recognition software. Similar sounding words can inadvertently be transcribed and may not be corrected upon review.

## 2018-03-02 NOTE — Progress Notes (Signed)
HPI The patient presents for followup of his slow heart rate, fib/flutter.  He is status post pacemaker placement.  Since I last saw him he is done well.  He is gained some weight.  He is right now battling a URI.  However, he otherwise feels fine. The patient denies any new symptoms such as chest discomfort, neck or arm discomfort. There has been no new shortness of breath, PND or orthopnea. There have been no reported palpitations, presyncope or syncope    Allergies  Allergen Reactions  . Prednisone     REACTION: GI bleed  . Sulfonamide Derivatives     REACTION: Rash    Current Outpatient Medications  Medication Sig Dispense Refill  . acetaminophen (TYLENOL) 500 MG tablet Take 500 mg by mouth every 6 (six) hours as needed (pain).     Marland Kitchen allopurinol (ZYLOPRIM) 300 MG tablet Take 1 tablet (300 mg total) by mouth daily. 90 tablet 3  . Ascorbic Acid (VITAMIN C PO) Take 1 capsule by mouth daily as needed (supplement).    Marland Kitchen b complex vitamins capsule Take 1 capsule by mouth daily.    . Cholecalciferol (VITAMIN D3) 2000 UNITS capsule Take 1 capsule (2,000 Units total) by mouth daily. (Patient taking differently: Take 6,000 Units by mouth daily. Pt stated he takes 6000 units. Takes 3 capsules (6000 units) daily.)    . Coenzyme Q10 300 MG CAPS Take 300 mg by mouth daily.    . colchicine 0.6 MG tablet Take 0.6 mg by mouth daily as needed (gout).     . Cyanocobalamin (VITAMIN B 12 PO) Take 1,000 mg by mouth daily.     Marland Kitchen FeFum-FePoly-FA-B Cmp-C-Biot (INTEGRA PLUS) CAPS TAKE 1 CAPSULE BY MOUTH EVERY MORNING 90 capsule 0  . fexofenadine (ALLEGRA) 180 MG tablet Take 180 mg by mouth daily.     . furosemide (LASIX) 20 MG tablet Take 20 mg by mouth daily.    . Omega-3 Fatty Acids (FISH OIL) 600 MG CAPS Take 600 mg by mouth daily.     . rivaroxaban (XARELTO) 20 MG TABS tablet TAKE 1 TABLET(20 MG) BY MOUTH DAILY WITH SUPPER 90 tablet 3  . tamsulosin (FLOMAX) 0.4 MG CAPS capsule Take 0.4 mg by  mouth 2 (two) times daily.   11   No current facility-administered medications for this visit.     Past Medical History:  Diagnosis Date  . Anemia   . Arthritis    "middle finger both hands" (09/05/2014)  . Basal cell carcinoma   . Benign prostatic hypertrophy    hx of  . Colon polyp 2006, 2010   TUBULAR ADENOMA  . Diverticulosis of colon (without mention of hemorrhage)   . First degree atrioventricular block   . GERD (gastroesophageal reflux disease)   . Gout   . History of blood transfusion 12/1953; 03/1954   Transfused 7 pints blood '55, 4 pints '56  . History of duodenal ulcer 1955   w/hemorrhage  . Kidney stones    "passed them"  . Morbid obesity ([place])    "I was 294; lost 100# 2014-2015"  . MVP (mitral valve prolapse)   . OSA (obstructive sleep apnea)    "stopped wearing mask after I lost 100#"  . Osteoarthritis   . Other thalassemia ([place])   . Pneumonia "a number of times"  . Presence of permanent cardiac pacemaker   . Prostate cancer [facility])     Past Surgical History:  Procedure Laterality Date  .  CATARACT EXTRACTION W/ INTRAOCULAR LENS  IMPLANT, BILATERAL  2002-2004  . CLOSED REDUCTION ANKLE DISLOCATION Right 1980's   "toe my achilles"  . COLONOSCOPY  2015   multiple   . EP IMPLANTABLE DEVICE N/A 09/05/2014   Procedure: Pacemaker Implant;  Surgeon: [name], MD;  Location: [facility];  Service: Cardiovascular;  Laterality: N/A;  . ESOPHAGOGASTRODUODENOSCOPY  2011   multiple  . GASTRECTOMY  03/1954   Transfused 7 pints blood '55, 4 pints '56  . GASTRECTOMY  1956   partial  . INSERT / REPLACE / REMOVE PACEMAKER  09/05/2014  . MOLE REMOVAL Left 2008   "forearm"  . TUMOR REMOVAL Left 2002   from finger- left index    ROS:  As stated in the HPI and negative for all other systems.   PHYSICAL EXAM BP 128/70   Pulse 78   Ht 5\' 7"  (1.702 m)   Wt 251 lb 9.6 oz (114.1 kg)   BMI 39.41 kg/m   GENERAL:  Well appearing NECK:  No jugular venous  distention, waveform within normal limits, carotid upstroke brisk and symmetric, no bruits, no thyromegaly LUNGS:  Clear to auscultation bilaterally CHEST:  Well healed pacer pocket HEART:  PMI not displaced or sustained,S1 and S2 within normal limits, no S3, no S4, no clicks, no rubs, no murmurs ABD:  Flat, positive bowel sounds normal in frequency in pitch, no bruits, no rebound, no guarding, no midline pulsatile mass, no hepatomegaly, no splenomegaly EXT:  2 plus pulses throughout, no edema, no cyanosis no clubbing   EKG:   Atrial flutter ventricular pacing 100%   03/04/2018  CBC    Component Value Date/Time   WBC 6.1 02/15/2018 0930   WBC 5.2 08/10/2017 0737   RBC 5.08 02/15/2018 0930   HGB 10.3 (L) 02/15/2018 0930   HGB 11.7 (L) 02/01/2017 1106   HCT 32.4 (L) 02/15/2018 0930   HCT 35.8 (L) 02/01/2017 1106   PLT 135 (L) 02/15/2018 0930   PLT 172 02/01/2017 1106   MCV 63.8 (L) 02/15/2018 0930   MCV 62.8 (L) 02/01/2017 1106   MCH 20.3 (L) 02/15/2018 0930   MCHC 31.8 02/15/2018 0930   RDW 16.8 (H) 02/15/2018 0930   RDW 17.1 (H) 02/01/2017 1106   LYMPHSABS 1.6 02/15/2018 0930   LYMPHSABS 1.5 02/01/2017 1106   MONOABS 0.6 02/15/2018 0930   MONOABS 0.4 02/01/2017 1106   EOSABS 0.2 02/15/2018 0930   EOSABS 0.2 02/01/2017 1106   BASOSABS 0.0 02/15/2018 0930   BASOSABS 0.0 02/01/2017 1106     ASSESSMENT AND PLAN   ATRIAL FIB -   Jacob [name] has a CHA2DS2 - VASc score of 2 with a risk of stroke of 2.2%.   Tolerates anticoagulation and does not notice his underlying rhythm.  No change in therapy.  BRADYCARDIA - He is status post pacemaker.  He is up-to-date with follow-up.  No change in therapy.  EDEMA - This is very mild.    MR - This was trace.  No change in therapy.    CARDIOMYOPATHY - His ejection fraction is mildly reduced but he seems to be euvolemic.  No change in therapy.

## 2018-03-04 ENCOUNTER — Ambulatory Visit (INDEPENDENT_AMBULATORY_CARE_PROVIDER_SITE_OTHER): Payer: Medicare Other | Admitting: Cardiology

## 2018-03-04 ENCOUNTER — Encounter: Payer: Self-pay | Admitting: Cardiology

## 2018-03-04 VITALS — BP 128/70 | HR 78 | Ht 67.0 in | Wt 251.6 lb

## 2018-03-04 DIAGNOSIS — M7989 Other specified soft tissue disorders: Secondary | ICD-10-CM

## 2018-03-04 DIAGNOSIS — R001 Bradycardia, unspecified: Secondary | ICD-10-CM

## 2018-03-04 DIAGNOSIS — I4892 Unspecified atrial flutter: Secondary | ICD-10-CM | POA: Diagnosis not present

## 2018-03-04 NOTE — Patient Instructions (Signed)

## 2018-03-30 LAB — CUP PACEART REMOTE DEVICE CHECK
Battery Remaining Longevity: 52 mo
Brady Statistic AP VP Percent: 0.31 %
Brady Statistic AP VS Percent: 0 %
Brady Statistic AS VP Percent: 98.04 %
Brady Statistic AS VS Percent: 1.65 %
Brady Statistic RV Percent Paced: 98.5 %
Date Time Interrogation Session: 20191119172220
Implantable Lead Implant Date: 20160622
Implantable Lead Implant Date: 20160622
Implantable Lead Location: 753860
Implantable Lead Model: 5076
Implantable Lead Model: 5076
Implantable Pulse Generator Implant Date: 20160622
Lead Channel Impedance Value: 304 Ohm
Lead Channel Impedance Value: 361 Ohm
Lead Channel Impedance Value: 437 Ohm
Lead Channel Pacing Threshold Amplitude: 0.625 V
Lead Channel Pacing Threshold Amplitude: 0.625 V
Lead Channel Pacing Threshold Pulse Width: 0.4 ms
Lead Channel Pacing Threshold Pulse Width: 0.4 ms
Lead Channel Sensing Intrinsic Amplitude: 1.25 mV
Lead Channel Sensing Intrinsic Amplitude: 1.25 mV
Lead Channel Sensing Intrinsic Amplitude: 12.5 mV
Lead Channel Sensing Intrinsic Amplitude: 12.5 mV
Lead Channel Setting Pacing Amplitude: 2 V
Lead Channel Setting Sensing Sensitivity: 4 mV
MDC IDC LEAD LOCATION: 753859
MDC IDC MSMT BATTERY VOLTAGE: 2.99 V
MDC IDC MSMT LEADCHNL RV IMPEDANCE VALUE: 475 Ohm
MDC IDC SET LEADCHNL RV PACING AMPLITUDE: 2.5 V
MDC IDC SET LEADCHNL RV PACING PULSEWIDTH: 0.4 ms
MDC IDC STAT BRADY RA PERCENT PACED: 0.18 %

## 2018-04-25 ENCOUNTER — Other Ambulatory Visit: Payer: Self-pay | Admitting: Cardiology

## 2018-05-03 ENCOUNTER — Ambulatory Visit (INDEPENDENT_AMBULATORY_CARE_PROVIDER_SITE_OTHER): Payer: Medicare Other

## 2018-05-03 DIAGNOSIS — R001 Bradycardia, unspecified: Secondary | ICD-10-CM

## 2018-05-03 DIAGNOSIS — I442 Atrioventricular block, complete: Secondary | ICD-10-CM

## 2018-05-04 LAB — CUP PACEART REMOTE DEVICE CHECK
Battery Remaining Longevity: 48 mo
Battery Voltage: 2.99 V
Brady Statistic AP VP Percent: 0.26 %
Brady Statistic AS VP Percent: 97.59 %
Brady Statistic AS VS Percent: 2.15 %
Brady Statistic RA Percent Paced: 0.15 %
Brady Statistic RV Percent Paced: 98.07 %
Date Time Interrogation Session: 20200218180648
Implantable Lead Implant Date: 20160622
Implantable Lead Location: 753859
Implantable Lead Location: 753860
Implantable Lead Model: 5076
Implantable Lead Model: 5076
Implantable Pulse Generator Implant Date: 20160622
Lead Channel Impedance Value: 361 Ohm
Lead Channel Impedance Value: 437 Ohm
Lead Channel Impedance Value: 475 Ohm
Lead Channel Pacing Threshold Amplitude: 0.625 V
Lead Channel Pacing Threshold Amplitude: 0.625 V
Lead Channel Pacing Threshold Pulse Width: 0.4 ms
Lead Channel Sensing Intrinsic Amplitude: 0.75 mV
Lead Channel Sensing Intrinsic Amplitude: 11.25 mV
Lead Channel Sensing Intrinsic Amplitude: 11.25 mV
Lead Channel Setting Pacing Amplitude: 2 V
Lead Channel Setting Pacing Amplitude: 2.5 V
Lead Channel Setting Pacing Pulse Width: 0.4 ms
Lead Channel Setting Sensing Sensitivity: 4 mV
MDC IDC LEAD IMPLANT DT: 20160622
MDC IDC MSMT LEADCHNL RA IMPEDANCE VALUE: 323 Ohm
MDC IDC MSMT LEADCHNL RA SENSING INTR AMPL: 0.75 mV
MDC IDC MSMT LEADCHNL RV PACING THRESHOLD PULSEWIDTH: 0.4 ms
MDC IDC STAT BRADY AP VS PERCENT: 0 %

## 2018-05-11 NOTE — Progress Notes (Signed)
Remote pacemaker transmission.   

## 2018-05-12 ENCOUNTER — Ambulatory Visit: Payer: Medicare Other | Admitting: Internal Medicine

## 2018-05-16 ENCOUNTER — Encounter: Payer: Self-pay | Admitting: Cardiology

## 2018-05-25 ENCOUNTER — Other Ambulatory Visit: Payer: Self-pay | Admitting: Internal Medicine

## 2018-05-25 DIAGNOSIS — D568 Other thalassemias: Secondary | ICD-10-CM

## 2018-05-27 ENCOUNTER — Telehealth: Payer: Self-pay | Admitting: *Deleted

## 2018-05-27 NOTE — Telephone Encounter (Signed)
Received vm message from patient regarding the date and time of his future appts.  Attemted to call back. No answer and no VM availability

## 2018-07-22 ENCOUNTER — Telehealth: Payer: Self-pay | Admitting: Internal Medicine

## 2018-07-22 NOTE — Telephone Encounter (Signed)
New message   Spoke w/pt about appt on 06.04.20. told him it needed to be rs to virtual d/t virus. Pt scheduled for 05.28.20. Offered pt 05.14.20 but he didn't want to do the visit until after his transmission. Pt has a smart phone but wasn't comfortable with video visit. Pt will do phone call, number listed in appt notes.     Virtual Visit Pre-Appointment Phone Call  "(Name), I am calling you today to discuss your upcoming appointment. We are currently trying to limit exposure to the virus that causes COVID-19 by seeing patients at home rather than in the office."  1. "What is the BEST phone number to call the day of the visit?" - include this in appointment notes  2. Do you have or have access to (through a family member/friend) a smartphone with video capability that we can use for your visit?" a. If yes - list this number in appt notes as cell (if different from BEST phone #) and list the appointment type as a VIDEO visit in appointment notes b. If no - list the appointment type as a PHONE visit in appointment notes  3. Confirm consent - "In the setting of the current Covid19 crisis, you are scheduled for a (phone or video) visit with your provider on (date) at (time).  Just as we do with many in-office visits, in order for you to participate in this visit, we must obtain consent.  If you'd like, I can send this to your mychart (if signed up) or email for you to review.  Otherwise, I can obtain your verbal consent now.  All virtual visits are billed to your insurance company just like a normal visit would be.  By agreeing to a virtual visit, we'd like you to understand that the technology does not allow for your provider to perform an examination, and thus may limit your provider's ability to fully assess your condition. If your provider identifies any concerns that need to be evaluated in person, we will make arrangements to do so.  Finally, though the technology is pretty good, we cannot  assure that it will always work on either your or our end, and in the setting of a video visit, we may have to convert it to a phone-only visit.  In either situation, we cannot ensure that we have a secure connection.  Are you willing to proceed?" STAFF: Did the patient verbally acknowledge consent to telehealth visit? Document YES/NO here: YES  4. Advise patient to be prepared - "Two hours prior to your appointment, go ahead and check your blood pressure, pulse, oxygen saturation, and your weight (if you have the equipment to check those) and write them all down. When your visit starts, your provider will ask you for this information. If you have an Apple Watch or Kardia device, please plan to have heart rate information ready on the day of your appointment. Please have a pen and paper handy nearby the day of the visit as well."  5. Give patient instructions for MyChart download to smartphone OR Doximity/Doxy.me as below if video visit (depending on what platform provider is using)  6. Inform patient they will receive a phone call 15 minutes prior to their appointment time (may be from unknown caller ID) so they should be prepared to answer    TELEPHONE CALL NOTE  Jacob [name] has been deemed a candidate for a follow-up tele-health visit to limit community exposure during the Covid-19 pandemic. I spoke with the  patient via phone to ensure availability of phone/video source, confirm preferred email & phone number, and discuss instructions and expectations.  I reminded Jacob [name] to be prepared with any vital sign and/or heart rhythm information that could potentially be obtained via home monitoring, at the time of his visit. I reminded Jacob [name] to expect a phone call prior to his visit.  Ashland P Edwards 07/22/2018 10:12 AM   INSTRUCTIONS FOR DOWNLOADING THE MYCHART APP TO SMARTPHONE  - The patient must first make sure to have activated MyChart and know their login  information - If Apple, go to [employer] and type in MyChart in the search bar and download the app. If Android, ask patient to go to [employer] and type in [place] in the search bar and download the app. The app is free but as with any other app downloads, their phone may require them to verify saved payment information or Apple/Android password.  - The patient will need to then log into the app with their MyChart username and password, and select [facility] as their healthcare provider to link the account. When it is time for your visit, go to the MyChart app, find appointments, and click Begin Video Visit. Be sure to Select Allow for your device to access the Microphone and Camera for your visit. You will then be connected, and your provider will be with you shortly.  **If they have any issues connecting, or need assistance please contact MyChart service desk [phone])**  **If using a computer, in order to ensure the best quality for their visit they will need to use either of the following Internet Browsers: [employer], or Google Chrome**  IF USING DOXIMITY or DOXY.ME - The patient will receive a link just prior to their visit by text.     FULL LENGTH CONSENT FOR TELE-HEALTH VISIT   I hereby voluntarily request, consent and authorize [facility] and its employed or contracted physicians, physician assistants, nurse practitioners or other licensed health care professionals (the Practitioner), to provide me with telemedicine health care services (the Services") as deemed necessary by the treating Practitioner. I acknowledge and consent to receive the Services by the Practitioner via telemedicine. I understand that the telemedicine visit will involve communicating with the Practitioner through live audiovisual communication technology and the disclosure of certain medical information by electronic transmission. I acknowledge that I have been given the opportunity to  request an in-person assessment or other available alternative prior to the telemedicine visit and am voluntarily participating in the telemedicine visit.  I understand that I have the right to withhold or withdraw my consent to the use of telemedicine in the course of my care at any time, without affecting my right to future care or treatment, and that the Practitioner or I may terminate the telemedicine visit at any time. I understand that I have the right to inspect all information obtained and/or recorded in the course of the telemedicine visit and may receive copies of available information for a reasonable fee.  I understand that some of the potential risks of receiving the Services via telemedicine include:   Delay or interruption in medical evaluation due to technological equipment failure or disruption;  Information transmitted may not be sufficient (e.g. poor resolution of images) to allow for appropriate medical decision making by the Practitioner; and/or   In rare instances, security protocols could fail, causing a breach of personal health information.  Furthermore, I acknowledge that it is  my responsibility to provide information about my medical history, conditions and care that is complete and accurate to the best of my ability. I acknowledge that Practitioner's advice, recommendations, and/or decision may be based on factors not within their control, such as incomplete or inaccurate data provided by me or distortions of diagnostic images or specimens that may result from electronic transmissions. I understand that the practice of medicine is not an exact science and that Practitioner makes no warranties or guarantees regarding treatment outcomes. I acknowledge that I will receive a copy of this consent concurrently upon execution via email to the email address I last provided but may also request a printed copy by calling the office of [facility].    I understand that my insurance  will be billed for this visit.   I have read or had this consent read to me.  I understand the contents of this consent, which adequately explains the benefits and risks of the Services being provided via telemedicine.   I have been provided ample opportunity to ask questions regarding this consent and the Services and have had my questions answered to my satisfaction.  I give my informed consent for the services to be provided through the use of telemedicine in my medical care  By participating in this telemedicine visit I agree to the above.

## 2018-07-28 ENCOUNTER — Telehealth: Payer: Medicare Other | Admitting: Internal Medicine

## 2018-08-02 ENCOUNTER — Other Ambulatory Visit: Payer: Self-pay

## 2018-08-02 ENCOUNTER — Telehealth: Payer: Self-pay | Admitting: Internal Medicine

## 2018-08-02 ENCOUNTER — Ambulatory Visit (INDEPENDENT_AMBULATORY_CARE_PROVIDER_SITE_OTHER): Payer: Medicare Other | Admitting: *Deleted

## 2018-08-02 DIAGNOSIS — I442 Atrioventricular block, complete: Secondary | ICD-10-CM

## 2018-08-02 NOTE — Telephone Encounter (Signed)
Spoke with patient. Informed him that transmission was received. Discussed lights on monitor. Patient denies additional questions or concerns at this time.

## 2018-08-02 NOTE — Telephone Encounter (Signed)
New Message:     Pt wants to know if you received his transmission today?

## 2018-08-03 LAB — CUP PACEART REMOTE DEVICE CHECK
Battery Remaining Longevity: 43 mo
Battery Voltage: 2.98 V
Brady Statistic AP VP Percent: 0.22 %
Brady Statistic AP VS Percent: 0 %
Brady Statistic AS VP Percent: 96.05 %
Brady Statistic AS VS Percent: 3.73 %
Brady Statistic RA Percent Paced: 0.12 %
Brady Statistic RV Percent Paced: 96.82 %
Date Time Interrogation Session: 20200519142600
Implantable Lead Implant Date: 20160622
Implantable Lead Implant Date: 20160622
Implantable Lead Location: 753859
Implantable Lead Location: 753860
Implantable Lead Model: 5076
Implantable Lead Model: 5076
Implantable Pulse Generator Implant Date: 20160622
Lead Channel Impedance Value: 323 Ohm
Lead Channel Impedance Value: 380 Ohm
Lead Channel Impedance Value: 437 Ohm
Lead Channel Impedance Value: 494 Ohm
Lead Channel Pacing Threshold Amplitude: 0.625 V
Lead Channel Pacing Threshold Amplitude: 0.625 V
Lead Channel Pacing Threshold Pulse Width: 0.4 ms
Lead Channel Pacing Threshold Pulse Width: 0.4 ms
Lead Channel Sensing Intrinsic Amplitude: 1.125 mV
Lead Channel Sensing Intrinsic Amplitude: 1.125 mV
Lead Channel Sensing Intrinsic Amplitude: 10.5 mV
Lead Channel Sensing Intrinsic Amplitude: 10.5 mV
Lead Channel Setting Pacing Amplitude: 2 V
Lead Channel Setting Pacing Amplitude: 2.5 V
Lead Channel Setting Pacing Pulse Width: 0.4 ms
Lead Channel Setting Sensing Sensitivity: 4 mV

## 2018-08-11 ENCOUNTER — Telehealth (INDEPENDENT_AMBULATORY_CARE_PROVIDER_SITE_OTHER): Payer: Medicare Other | Admitting: Internal Medicine

## 2018-08-11 ENCOUNTER — Encounter: Payer: Self-pay | Admitting: Cardiology

## 2018-08-11 ENCOUNTER — Other Ambulatory Visit: Payer: Self-pay

## 2018-08-11 DIAGNOSIS — I480 Paroxysmal atrial fibrillation: Secondary | ICD-10-CM

## 2018-08-11 DIAGNOSIS — Z950 Presence of cardiac pacemaker: Secondary | ICD-10-CM

## 2018-08-11 DIAGNOSIS — E669 Obesity, unspecified: Secondary | ICD-10-CM | POA: Diagnosis not present

## 2018-08-11 DIAGNOSIS — I442 Atrioventricular block, complete: Secondary | ICD-10-CM | POA: Diagnosis not present

## 2018-08-11 DIAGNOSIS — I4819 Other persistent atrial fibrillation: Secondary | ICD-10-CM

## 2018-08-11 NOTE — Progress Notes (Signed)
Remote pacemaker transmission.   

## 2018-08-11 NOTE — Progress Notes (Signed)
Electrophysiology TeleHealth Note   Due to national recommendations of social distancing due to COVID 19, an audio/video telehealth visit is felt to be most appropriate for this patient at this time.  See MyChart message from today for the patient's consent to telehealth for [facility]   Date:  08/11/2018   ID:  Jacob [name], DOB July 01, 1933, MRN [id number]  Location: patient's home  Provider location: [address] [place]  Evaluation Performed: Follow-up visit  PCP:  [name], [name], MD  Cardiologist:  No primary care provider on file. [facility] Electrophysiologist:  Dr Lovena Le  Chief Complaint:  "I've been walking every other day."  History of Present Illness:    Jacob [name] is a 85 y.o. male who presents via audio/video conferencing for a telehealth visit today. He is a pleasant 85 yo man with a h/o CHB, s/p PPM insertion, persistent atrial fib/flutter, obesity, and HTN.  Since last being seen in our clinic, the patient reports doing very well.  Today, he denies symptoms of palpitations, chest pain, shortness of breath,  lower extremity edema, dizziness, presyncope, or syncope.  The patient is otherwise without complaint today.  The patient denies symptoms of fevers, chills, cough, or new SOB worrisome for COVID 19.  Past Medical History:  Diagnosis Date  . Anemia   . Arthritis    "middle finger both hands" (09/05/2014)  . Basal cell carcinoma   . Benign prostatic hypertrophy    hx of  . Colon polyp 2006, 2010   TUBULAR ADENOMA  . Diverticulosis of colon (without mention of hemorrhage)   . First degree atrioventricular block   . GERD (gastroesophageal reflux disease)   . Gout   . History of blood transfusion 12/1953; 03/1954   Transfused 7 pints blood '55, 4 pints '56  . History of duodenal ulcer 1955   w/hemorrhage  . Kidney stones    "passed them"  . Morbid obesity ([place])    "I was 89; lost 100# 2014-2015"  . MVP (mitral valve  prolapse)   . OSA (obstructive sleep apnea)    "stopped wearing mask after I lost 100#"  . Osteoarthritis   . Other thalassemia ([place])   . Pneumonia "a number of times"  . Presence of permanent cardiac pacemaker   . Prostate cancer [facility])     Past Surgical History:  Procedure Laterality Date  . CATARACT EXTRACTION W/ INTRAOCULAR LENS  IMPLANT, BILATERAL  2002-2004  . CLOSED REDUCTION ANKLE DISLOCATION Right 1980's   "toe my achilles"  . COLONOSCOPY  2015   multiple   . EP IMPLANTABLE DEVICE N/A 09/05/2014   Procedure: Pacemaker Implant;  Surgeon: [name], MD;  Location: [facility];  Service: Cardiovascular;  Laterality: N/A;  . ESOPHAGOGASTRODUODENOSCOPY  2011   multiple  . GASTRECTOMY  03/1954   Transfused 7 pints blood '55, 4 pints '56  . GASTRECTOMY  1956   partial  . INSERT / REPLACE / REMOVE PACEMAKER  09/05/2014  . MOLE REMOVAL Left 2008   "forearm"  . TUMOR REMOVAL Left 2002   from finger- left index    Current Outpatient Medications  Medication Sig Dispense Refill  . acetaminophen (TYLENOL) 500 MG tablet Take 500 mg by mouth every 6 (six) hours as needed (pain).     Marland Kitchen allopurinol (ZYLOPRIM) 300 MG tablet Take 1 tablet (300 mg total) by mouth daily. 90 tablet 3  . Ascorbic Acid (VITAMIN C PO) Take 1 capsule by  mouth daily as needed (supplement).    Marland Kitchen b complex vitamins capsule Take 1 capsule by mouth daily.    . Cholecalciferol (VITAMIN D3) 2000 UNITS capsule Take 1 capsule (2,000 Units total) by mouth daily. (Patient taking differently: Take 6,000 Units by mouth daily. Pt stated he takes 6000 units. Takes 3 capsules (6000 units) daily.)    . Coenzyme Q10 300 MG CAPS Take 300 mg by mouth daily.    . colchicine 0.6 MG tablet Take 0.6 mg by mouth daily as needed (gout).     . Cyanocobalamin (VITAMIN B 12 PO) Take 1,000 mg by mouth daily.     Marland Kitchen FeFum-FePoly-FA-B Cmp-C-Biot (INTEGRA PLUS) CAPS TAKE 1 CAPSULE BY MOUTH EVERY MORNING 90 capsule 0  . fexofenadine  (ALLEGRA) 180 MG tablet Take 180 mg by mouth daily.     . furosemide (LASIX) 20 MG tablet Take 20 mg by mouth daily.    . furosemide (LASIX) 20 MG tablet Take 1 tablet (20 mg total) by mouth daily. 90 tablet 2  . Omega-3 Fatty Acids (FISH OIL) 600 MG CAPS Take 600 mg by mouth daily.     . rivaroxaban (XARELTO) 20 MG TABS tablet TAKE 1 TABLET(20 MG) BY MOUTH DAILY WITH SUPPER 90 tablet 3  . tamsulosin (FLOMAX) 0.4 MG CAPS capsule Take 0.4 mg by mouth 2 (two) times daily.   11   No current facility-administered medications for this visit.     Allergies:   Prednisone and Sulfonamide derivatives   Social History:  The patient  reports that he has quit smoking. His smoking use included cigarettes. He started smoking about 48 years ago. He has a 60.00 pack-year smoking history. He has never used smokeless tobacco. He reports that he does not drink alcohol or use drugs.   Family History:  The patient's family history includes Cancer in his daughter; Coronary artery disease in his father; Gout in his father; Heart attack in his father.   ROS:  Please see the history of present illness.   All other systems are personally reviewed and negative.    Exam:    Vital Signs:  Wt. - 246, BP - 128/72, p - 60   Labs/Other Tests and Data Reviewed:    Recent Labs: 02/15/2018: Hemoglobin 10.3; Platelet Count 135   Wt Readings from Last 3 Encounters:  03/04/18 251 lb 9.6 oz (114.1 kg)  02/25/18 255 lb 6.4 oz (115.8 kg)  08/26/17 244 lb (110.7 kg)     Other studies personally reviewed: Additional studies/ records that were reviewed today include:  Last device remote is reviewed from [facility] PDF dated 08/02/18 which reveals normal device function, no arrhythmias except for chronic atrial fib   ASSESSMENT & PLAN:    1.  Persistent atrial fib/flutter - his rates are controlled.  2. CHB - he is asymptomatic, s/p PPM insertion. 3. Obesity - his weight has been up and down. He has gained weight. I  encouraged him to lose weight. 4. COVID 19 screen The patient denies symptoms of COVID 19 at this time.  The importance of social distancing was discussed today.  Follow-up:  1 year Next remote: 8/20  Current medicines are reviewed at length with the patient today.   The patient does not have concerns regarding his medicines.  The following changes were made today:  none  Labs/ tests ordered today include: none No orders of the defined types were placed in this encounter.    Patient Risk:  after full  review of this patients clinical status, I feel that they are at moderate risk at this time.  Today, I have spent 25 minutes with the patient with telehealth technology discussing all of the above.    Signed, Cristopher Peru, MD  08/11/2018 8:54 AM     [place] [address] [place]  [id number] [phone] (office) [phone] (fax)

## 2018-08-15 ENCOUNTER — Other Ambulatory Visit: Payer: Self-pay | Admitting: Internal Medicine

## 2018-08-15 NOTE — Telephone Encounter (Signed)
Xarelto 20mg  refill request received; pt is 85 yrs old, wt-114.1kg, Crea-0.90 on 03/07/2018, CrCl-85.20ml/min, last visit with by Dr. Lovena Le on 08/11/2018; will send in refill to requested pharmacy.

## 2018-08-18 ENCOUNTER — Other Ambulatory Visit: Payer: Medicare Other

## 2018-08-18 ENCOUNTER — Encounter: Payer: Medicare Other | Admitting: Internal Medicine

## 2018-08-25 ENCOUNTER — Ambulatory Visit: Payer: Medicare Other | Admitting: Internal Medicine

## 2018-08-25 ENCOUNTER — Other Ambulatory Visit: Payer: Medicare Other

## 2018-09-06 ENCOUNTER — Other Ambulatory Visit: Payer: Self-pay | Admitting: Internal Medicine

## 2018-09-06 DIAGNOSIS — D568 Other thalassemias: Secondary | ICD-10-CM

## 2018-11-01 ENCOUNTER — Encounter: Payer: Medicare Other | Admitting: *Deleted

## 2018-11-02 ENCOUNTER — Telehealth: Payer: Self-pay

## 2018-11-02 NOTE — Telephone Encounter (Signed)
Pt called [employer] because he received the error code 3248. He was one the phone with me and [employer]. I told him I hear that he received the error code and I will put that in his chart.

## 2018-11-02 NOTE — Telephone Encounter (Signed)
Spoke with patient to remind of missed remote transmission 

## 2018-11-10 ENCOUNTER — Encounter: Payer: Self-pay | Admitting: Cardiology

## 2018-11-18 ENCOUNTER — Telehealth (HOSPITAL_COMMUNITY): Payer: Self-pay | Admitting: *Deleted

## 2018-11-18 ENCOUNTER — Ambulatory Visit (INDEPENDENT_AMBULATORY_CARE_PROVIDER_SITE_OTHER): Payer: Medicare Other | Admitting: *Deleted

## 2018-11-18 DIAGNOSIS — I442 Atrioventricular block, complete: Secondary | ICD-10-CM | POA: Diagnosis not present

## 2018-11-18 NOTE — Telephone Encounter (Signed)
Transmission received and he has been added to the schedule.

## 2018-11-18 NOTE — Telephone Encounter (Signed)
Pt had left voicemail on AF phone regarding needing assistance with device transmission - pt of Dr. Lovena Le. Pt can be reached at [phone]

## 2018-11-18 NOTE — Telephone Encounter (Signed)
Pt states he got a new monitor and just needed to know when we wanted him to send the transmission. I told him he could send the transmission now. Pt states he was going to send it and thanked me for the call.

## 2018-11-20 LAB — CUP PACEART REMOTE DEVICE CHECK
Battery Remaining Longevity: 37 mo
Battery Voltage: 2.98 V
Brady Statistic AP VP Percent: 0.26 %
Brady Statistic AP VS Percent: 0 %
Brady Statistic AS VP Percent: 97.46 %
Brady Statistic AS VS Percent: 2.28 %
Brady Statistic RA Percent Paced: 0.15 %
Brady Statistic RV Percent Paced: 97.99 %
Date Time Interrogation Session: 20200904144928
Implantable Lead Implant Date: 20160622
Implantable Lead Implant Date: 20160622
Implantable Lead Location: 753859
Implantable Lead Location: 753860
Implantable Lead Model: 5076
Implantable Lead Model: 5076
Implantable Pulse Generator Implant Date: 20160622
Lead Channel Impedance Value: 304 Ohm
Lead Channel Impedance Value: 342 Ohm
Lead Channel Impedance Value: 418 Ohm
Lead Channel Impedance Value: 418 Ohm
Lead Channel Pacing Threshold Amplitude: 0.625 V
Lead Channel Pacing Threshold Amplitude: 0.75 V
Lead Channel Pacing Threshold Pulse Width: 0.4 ms
Lead Channel Pacing Threshold Pulse Width: 0.4 ms
Lead Channel Sensing Intrinsic Amplitude: 1.375 mV
Lead Channel Sensing Intrinsic Amplitude: 1.375 mV
Lead Channel Sensing Intrinsic Amplitude: 5.875 mV
Lead Channel Sensing Intrinsic Amplitude: 5.875 mV
Lead Channel Setting Pacing Amplitude: 2 V
Lead Channel Setting Pacing Amplitude: 2.5 V
Lead Channel Setting Pacing Pulse Width: 0.4 ms
Lead Channel Setting Sensing Sensitivity: 4 mV

## 2018-11-30 ENCOUNTER — Encounter: Payer: Self-pay | Admitting: Cardiology

## 2018-11-30 NOTE — Progress Notes (Signed)
Remote pacemaker transmission.   

## 2018-12-24 ENCOUNTER — Other Ambulatory Visit: Payer: Self-pay | Admitting: Internal Medicine

## 2018-12-24 DIAGNOSIS — D568 Other thalassemias: Secondary | ICD-10-CM

## 2019-02-01 ENCOUNTER — Other Ambulatory Visit: Payer: Self-pay

## 2019-02-01 MED ORDER — FUROSEMIDE 20 MG PO TABS: 20.0000 mg | ORAL_TABLET | Freq: Every day | ORAL | 2 refills | Status: DC

## 2019-02-17 ENCOUNTER — Ambulatory Visit (INDEPENDENT_AMBULATORY_CARE_PROVIDER_SITE_OTHER): Payer: Medicare Other | Admitting: *Deleted

## 2019-02-17 DIAGNOSIS — I442 Atrioventricular block, complete: Secondary | ICD-10-CM

## 2019-02-18 LAB — CUP PACEART REMOTE DEVICE CHECK
Battery Remaining Longevity: 38 mo
Battery Voltage: 2.98 V
Brady Statistic AP VP Percent: 0.24 %
Brady Statistic AP VS Percent: 0 %
Brady Statistic AS VP Percent: 96.85 %
Brady Statistic AS VS Percent: 2.91 %
Brady Statistic RA Percent Paced: 0.14 %
Brady Statistic RV Percent Paced: 97.42 %
Date Time Interrogation Session: 20201204160154
Implantable Lead Implant Date: 20160622
Implantable Lead Implant Date: 20160622
Implantable Lead Location: 753859
Implantable Lead Location: 753860
Implantable Lead Model: 5076
Implantable Lead Model: 5076
Implantable Pulse Generator Implant Date: 20160622
Lead Channel Impedance Value: 323 Ohm
Lead Channel Impedance Value: 380 Ohm
Lead Channel Impedance Value: 437 Ohm
Lead Channel Impedance Value: 475 Ohm
Lead Channel Pacing Threshold Amplitude: 0.625 V
Lead Channel Pacing Threshold Amplitude: 0.75 V
Lead Channel Pacing Threshold Pulse Width: 0.4 ms
Lead Channel Pacing Threshold Pulse Width: 0.4 ms
Lead Channel Sensing Intrinsic Amplitude: 0.875 mV
Lead Channel Sensing Intrinsic Amplitude: 0.875 mV
Lead Channel Sensing Intrinsic Amplitude: 11 mV
Lead Channel Sensing Intrinsic Amplitude: 11 mV
Lead Channel Setting Pacing Amplitude: 2 V
Lead Channel Setting Pacing Amplitude: 2.5 V
Lead Channel Setting Pacing Pulse Width: 0.4 ms
Lead Channel Setting Sensing Sensitivity: 4 mV

## 2019-03-03 ENCOUNTER — Ambulatory Visit: Payer: Medicare Other | Admitting: Cardiology

## 2019-03-28 DIAGNOSIS — I4892 Unspecified atrial flutter: Secondary | ICD-10-CM | POA: Insufficient documentation

## 2019-03-28 DIAGNOSIS — Z7189 Other specified counseling: Secondary | ICD-10-CM | POA: Insufficient documentation

## 2019-03-28 DIAGNOSIS — R001 Bradycardia, unspecified: Secondary | ICD-10-CM | POA: Insufficient documentation

## 2019-03-28 NOTE — Progress Notes (Signed)
Virtual Visit via Telephone Note   This visit type was conducted due to national recommendations for restrictions regarding the COVID-19 Pandemic (e.g. social distancing) in an effort to limit this patient's exposure and mitigate transmission in our community.  Due to his co-morbid illnesses, this patient is at least at moderate risk for complications without adequate follow up.  This format is felt to be most appropriate for this patient at this time.  The patient did not have access to video technology/had technical difficulties with video requiring transitioning to audio format only (telephone).  All issues noted in this document were discussed and addressed.  No physical exam could be performed with this format.  Please refer to the patient's chart for his  consent to telehealth for [facility].   Date:  03/29/2019   ID:  Jacob [name], DOB 09/27/1933, MRN [id number]  Patient Location: Home Provider Location: Home  PCP:  [name], [name], MD  Cardiologist:  [name], MD  Electrophysiologist:  Cristopher Peru, MD   Evaluation Performed:  Follow-Up Visit  Chief Complaint:  Chest pain  History of Present Illness:    Jacob [name] is a 86 y.o. male with for followup of his slow heart rate, fib/flutter.  He is status post pacemaker placement.  Since I last saw him he had an episode of chest pain.  This happened after he did his 60-minute biking exercise.  It did not happen while he was exercising.  Is not had any chest pressure, neck or arm discomfort.  He has had no new shortness of breath, PND or orthopnea.  He otherwise has felt well.  The patient does not have symptoms concerning for COVID-19 infection (fever, chills, cough, or new shortness of breath).    Past Medical History:  Diagnosis Date  . Anemia   . Arthritis    "middle finger both hands" (09/05/2014)  . Basal cell carcinoma   . Benign prostatic hypertrophy    hx of  . Colon polyp 2006, 2010    TUBULAR ADENOMA  . Diverticulosis of colon (without mention of hemorrhage)   . First degree atrioventricular block   . GERD (gastroesophageal reflux disease)   . Gout   . History of blood transfusion 12/1953; 03/1954   Transfused 7 pints blood '55, 4 pints '56  . History of duodenal ulcer 1955   w/hemorrhage  . Kidney stones    "passed them"  . Morbid obesity ([place])    "I was 294; lost 100# 2014-2015"  . MVP (mitral valve prolapse)   . OSA (obstructive sleep apnea)    "stopped wearing mask after I lost 100#"  . Osteoarthritis   . Other thalassemia ([place])   . Pneumonia "a number of times"  . Presence of permanent cardiac pacemaker   . Prostate cancer [facility])    Past Surgical History:  Procedure Laterality Date  . CATARACT EXTRACTION W/ INTRAOCULAR LENS  IMPLANT, BILATERAL  2002-2004  . CLOSED REDUCTION ANKLE DISLOCATION Right 1980's   "toe my achilles"  . COLONOSCOPY  2015   multiple   . EP IMPLANTABLE DEVICE N/A 09/05/2014   Procedure: Pacemaker Implant;  Surgeon: [name], MD;  Location: [facility];  Service: Cardiovascular;  Laterality: N/A;  . ESOPHAGOGASTRODUODENOSCOPY  2011   multiple  . GASTRECTOMY  03/1954   Transfused 7 pints blood '55, 4 pints '56  . GASTRECTOMY  1956   partial  . INSERT / REPLACE / REMOVE PACEMAKER  09/05/2014  .  MOLE REMOVAL Left 2008   "forearm"  . TUMOR REMOVAL Left 2002   from finger- left index     Prior to Admission medications   Medication Sig Start Date End Date Taking? Authorizing Provider  acetaminophen (TYLENOL) 500 MG tablet Take 500 mg by mouth every 6 (six) hours as needed (pain).    Yes [provider]  allopurinol (ZYLOPRIM) 300 MG tablet Take 1 tablet (300 mg total) by mouth daily. 05/04/13  Yes Norins, Heinz Knuckles, MD  Ascorbic Acid (VITAMIN C PO) Take 1 capsule by mouth daily as needed (supplement).   Yes [provider]  b complex vitamins capsule Take 1 capsule by mouth daily.   Yes [provider]  Cholecalciferol (VITAMIN D3) 2000 UNITS capsule Take 1 capsule (2,000 Units total) by mouth daily. Patient taking differently: Take 6,000 Units by mouth daily. Pt stated he takes 6000 units. Takes 3 capsules (6000 units) daily. 11/02/12  Yes Norins, Heinz Knuckles, MD  Coenzyme Q10 300 MG CAPS Take 300 mg by mouth daily.   Yes [provider]  colchicine 0.6 MG tablet Take 0.6 mg by mouth daily as needed (gout).  05/08/13  Yes Norins, Heinz Knuckles, MD  Cyanocobalamin (VITAMIN B 12 PO) Take 1,000 mg by mouth daily.    Yes [provider]  FeFum-FePoly-FA-B Cmp-C-Biot (INTEGRA PLUS) CAPS TAKE 1 CAPSULE BY MOUTH EVERY MORNING 12/24/18  Yes Curt Bears, MD  fexofenadine (ALLEGRA) 180 MG tablet Take 180 mg by mouth daily.    Yes [provider]  furosemide (LASIX) 20 MG tablet Take 1 tablet (20 mg total) by mouth daily. 02/01/19  Yes [name], MD  Omega-3 Fatty Acids (FISH OIL) 600 MG CAPS Take 600 mg by mouth daily.    Yes [provider]  tamsulosin (FLOMAX) 0.4 MG CAPS capsule Take 0.4 mg by mouth 2 (two) times daily.  11/13/13  Yes [provider]  XARELTO 20 MG TABS tablet TAKE 1 TABLET(20 MG) BY MOUTH DAILY WITH SUPPER 08/15/18  Yes [name], MD  FeFum-FePoly-FA-B Cmp-C-Biot (INTEGRA PLUS) CAPS TAKE 1 CAPSULE BY MOUTH EVERY MORNING 09/06/18   Curt Bears, MD    Allergies:   Prednisone and Sulfonamide derivatives   Social History   Tobacco Use  . Smoking status: Former Smoker    Packs/day: 3.00    Years: 20.00    Pack years: 60.00    Types: Cigarettes    Start date: 01/21/1970  . Smokeless tobacco: Never Used  . Tobacco comment: quite smoking cigarettes in 1971  Substance Use Topics  . Alcohol use: No    Comment: "no alcohol since 1987"  . Drug use: No     Family Hx: The patient's family history includes Cancer in his daughter; Coronary artery disease in his father; Gout in his father; Heart attack in his father.  There is no history of Colon cancer.  ROS:   Please see the history of present illness.     All other systems reviewed and are negative.   Prior CV studies:   The following studies were reviewed today:    Labs/Other Tests and Data Reviewed:    EKG:  No ECG reviewed.  Recent Labs: No results found for requested labs within last 8760 hours.   Recent Lipid Panel Lab Results  Component Value Date/Time   CHOL 166 11/03/2012 07:35 AM   TRIG 45.0 11/03/2012 07:35 AM   HDL 49.90 11/03/2012 07:35 AM   CHOLHDL 3 11/03/2012 07:35  AM   LDLCALC 107 (H) 11/03/2012 07:35 AM   LDLDIRECT 145.0 08/29/2007 02:26 PM    Wt Readings from Last 3 Encounters:  03/29/19 257 lb (116.6 kg)  03/04/18 251 lb 9.6 oz (114.1 kg)  02/25/18 255 lb 6.4 oz (115.8 kg)     Objective:    Vital Signs:  Pulse 88   Ht 5\' 7"  (1.702 m)   Wt 257 lb (116.6 kg)   SpO2 98%   BMI 40.25 kg/m    VITAL SIGNS:  reviewed  ASSESSMENT & PLAN:    ATRIAL FIB -   Jacob [name] has a CHA2DS2 - VASc score of 2 with a risk of stroke of 2.2%.  No change in therapy.   BRADYCARDIA - He is status post pacemaker.   He is up-to-date with follow-up.  No change in therapy.  MR - This was trace.  No change in therapy.  No further imaging  CARDIOMYOPATHY - He sounds historically like he would be euvolemic.  I would not expect that he would need an echocardiogram.  No change in therapy.   CHEST PAIN - This was atypical.  He will call me back if he has any further discomfort.  I told him that he probably should be more graduated and increasing his exercise.  No change in therapy.  He will let me know if he has more symptoms  COVID-19 Education: The signs and symptoms of COVID-19 were discussed with the patient and how to seek care for testing (follow up with PCP or arrange E-visit).  We talked about the vaccine.  The importance of social distancing was discussed today.  He is scheduled for the vaccine. Time:    Today, I have spent 16 minutes with the patient with telehealth technology discussing the above problems.      Medication Adjustments/Labs and Tests Ordered: Current medicines are reviewed at length with the patient today.  Concerns regarding medicines are outlined above.   Tests Ordered: No orders of the defined types were placed in this encounter.   Medication Changes: No orders of the defined types were placed in this encounter.   Follow Up:  In Person In one year  Signed, [name], MD  03/29/2019 8:43 AM    [place]

## 2019-03-29 ENCOUNTER — Encounter: Payer: Self-pay | Admitting: Cardiology

## 2019-03-29 ENCOUNTER — Telehealth (INDEPENDENT_AMBULATORY_CARE_PROVIDER_SITE_OTHER): Payer: Medicare Other | Admitting: Cardiology

## 2019-03-29 VITALS — BP 138/75 | HR 88 | Ht 67.0 in | Wt 257.0 lb

## 2019-03-29 DIAGNOSIS — R001 Bradycardia, unspecified: Secondary | ICD-10-CM | POA: Diagnosis not present

## 2019-03-29 DIAGNOSIS — Z7189 Other specified counseling: Secondary | ICD-10-CM | POA: Diagnosis not present

## 2019-03-29 DIAGNOSIS — I4892 Unspecified atrial flutter: Secondary | ICD-10-CM

## 2019-03-29 MED ORDER — RIVAROXABAN 20 MG PO TABS: ORAL_TABLET | ORAL | 3 refills | Status: DC

## 2019-03-29 NOTE — Patient Instructions (Signed)
Medication Instructions:  No changes Xarelto refilled *If you need a refill on your cardiac medications before your next appointment, please call your pharmacy*  Lab Work: None.  Testing/Procedures: None  Follow-Up: At [facility], you and your health needs are our priority.  As part of our continuing mission to provide you with exceptional heart care, we have created designated Provider Care Teams.  These Care Teams include your primary Cardiologist (physician) and Advanced Practice Providers (APPs -  Physician Assistants and Nurse Practitioners) who all work together to provide you with the care you need, when you need it.  Your next appointment:   1 year(s)  The format for your next appointment:   In Person  Provider:   [name], MD

## 2019-03-31 ENCOUNTER — Other Ambulatory Visit: Payer: Self-pay | Admitting: Internal Medicine

## 2019-03-31 DIAGNOSIS — D568 Other thalassemias: Secondary | ICD-10-CM

## 2019-05-19 ENCOUNTER — Ambulatory Visit (INDEPENDENT_AMBULATORY_CARE_PROVIDER_SITE_OTHER): Payer: Medicare Other | Admitting: *Deleted

## 2019-05-19 DIAGNOSIS — I442 Atrioventricular block, complete: Secondary | ICD-10-CM | POA: Diagnosis not present

## 2019-05-19 LAB — CUP PACEART REMOTE DEVICE CHECK
Battery Remaining Longevity: 35 mo
Battery Voltage: 2.97 V
Brady Statistic AP VP Percent: 0.26 %
Brady Statistic AP VS Percent: 0 %
Brady Statistic AS VP Percent: 97.11 %
Brady Statistic AS VS Percent: 2.63 %
Brady Statistic RA Percent Paced: 0.15 %
Brady Statistic RV Percent Paced: 97.65 %
Date Time Interrogation Session: 20210305104619
Implantable Lead Implant Date: 20160622
Implantable Lead Implant Date: 20160622
Implantable Lead Location: 753859
Implantable Lead Location: 753860
Implantable Lead Model: 5076
Implantable Lead Model: 5076
Implantable Pulse Generator Implant Date: 20160622
Lead Channel Impedance Value: 304 Ohm
Lead Channel Impedance Value: 323 Ohm
Lead Channel Impedance Value: 418 Ohm
Lead Channel Impedance Value: 437 Ohm
Lead Channel Pacing Threshold Amplitude: 0.625 V
Lead Channel Pacing Threshold Amplitude: 0.75 V
Lead Channel Pacing Threshold Pulse Width: 0.4 ms
Lead Channel Pacing Threshold Pulse Width: 0.4 ms
Lead Channel Sensing Intrinsic Amplitude: 0.75 mV
Lead Channel Sensing Intrinsic Amplitude: 0.75 mV
Lead Channel Sensing Intrinsic Amplitude: 10.5 mV
Lead Channel Sensing Intrinsic Amplitude: 10.5 mV
Lead Channel Setting Pacing Amplitude: 2 V
Lead Channel Setting Pacing Amplitude: 2.5 V
Lead Channel Setting Pacing Pulse Width: 0.4 ms
Lead Channel Setting Sensing Sensitivity: 4 mV

## 2019-05-19 NOTE — Progress Notes (Signed)
PPM Remote  

## 2019-07-17 ENCOUNTER — Other Ambulatory Visit: Payer: Self-pay | Admitting: Internal Medicine

## 2019-07-17 DIAGNOSIS — D568 Other thalassemias: Secondary | ICD-10-CM

## 2019-08-18 ENCOUNTER — Ambulatory Visit (INDEPENDENT_AMBULATORY_CARE_PROVIDER_SITE_OTHER): Payer: Medicare Other | Admitting: *Deleted

## 2019-08-18 ENCOUNTER — Telehealth: Payer: Self-pay

## 2019-08-18 DIAGNOSIS — I442 Atrioventricular block, complete: Secondary | ICD-10-CM

## 2019-08-18 LAB — CUP PACEART REMOTE DEVICE CHECK
Battery Remaining Longevity: 31 mo
Battery Voltage: 2.96 V
Brady Statistic AP VP Percent: 0.26 %
Brady Statistic AP VS Percent: 0 %
Brady Statistic AS VP Percent: 97.48 %
Brady Statistic AS VS Percent: 2.27 %
Brady Statistic RA Percent Paced: 0.15 %
Brady Statistic RV Percent Paced: 97.97 %
Date Time Interrogation Session: 20210604125519
Implantable Lead Implant Date: 20160622
Implantable Lead Implant Date: 20160622
Implantable Lead Location: 753859
Implantable Lead Location: 753860
Implantable Lead Model: 5076
Implantable Lead Model: 5076
Implantable Pulse Generator Implant Date: 20160622
Lead Channel Impedance Value: 323 Ohm
Lead Channel Impedance Value: 342 Ohm
Lead Channel Impedance Value: 437 Ohm
Lead Channel Impedance Value: 456 Ohm
Lead Channel Pacing Threshold Amplitude: 0.625 V
Lead Channel Pacing Threshold Amplitude: 0.75 V
Lead Channel Pacing Threshold Pulse Width: 0.4 ms
Lead Channel Pacing Threshold Pulse Width: 0.4 ms
Lead Channel Sensing Intrinsic Amplitude: 1.125 mV
Lead Channel Sensing Intrinsic Amplitude: 1.125 mV
Lead Channel Sensing Intrinsic Amplitude: 11.375 mV
Lead Channel Sensing Intrinsic Amplitude: 11.375 mV
Lead Channel Setting Pacing Amplitude: 2 V
Lead Channel Setting Pacing Amplitude: 2.5 V
Lead Channel Setting Pacing Pulse Width: 0.4 ms
Lead Channel Setting Sensing Sensitivity: 4 mV

## 2019-08-18 NOTE — Telephone Encounter (Signed)
Unable to speak  with patient to remind of missed remote transmission 

## 2019-08-23 NOTE — Progress Notes (Signed)
Remote pacemaker transmission.   

## 2019-09-28 ENCOUNTER — Other Ambulatory Visit: Payer: Self-pay

## 2019-09-28 ENCOUNTER — Ambulatory Visit (INDEPENDENT_AMBULATORY_CARE_PROVIDER_SITE_OTHER): Payer: Medicare Other | Admitting: Internal Medicine

## 2019-09-28 ENCOUNTER — Encounter: Payer: Self-pay | Admitting: Internal Medicine

## 2019-09-28 VITALS — BP 110/70 | HR 83 | Ht 67.0 in | Wt 252.0 lb

## 2019-09-28 DIAGNOSIS — I480 Paroxysmal atrial fibrillation: Secondary | ICD-10-CM | POA: Diagnosis not present

## 2019-09-28 DIAGNOSIS — I442 Atrioventricular block, complete: Secondary | ICD-10-CM

## 2019-09-28 DIAGNOSIS — Z950 Presence of cardiac pacemaker: Secondary | ICD-10-CM | POA: Diagnosis not present

## 2019-09-28 NOTE — Patient Instructions (Signed)
Medication Instructions:  Your physician recommends that you continue on your current medications as directed. Please refer to the Current Medication list given to you today.  Labwork: None ordered.  Testing/Procedures: None ordered.  Follow-Up: Your physician wants you to follow-up in: one year with Dr. Lovena Le.  You will receive a reminder letter in the mail two months in advance. If you don't receive a letter, please call our office to schedule the follow-up appointment.  Remote monitoring is used to monitor your Pacemaker from home. This monitoring reduces the number of office visits required to check your device to one time per year. It allows [place] to keep an eye on the functioning of your device to ensure it is working properly. You are scheduled for a device check from home on 11/17/2019. You may send your transmission at any time that day. If you have a wireless device, the transmission will be sent automatically. After your physician reviews your transmission, you will receive a postcard with your next transmission date.  Any Other Special Instructions Will Be Listed Below (If Applicable).  If you need a refill on your cardiac medications before your next appointment, please call your pharmacy.

## 2019-09-28 NOTE — Progress Notes (Signed)
HPI [name] returns today for ongoing PPM followup. He has CHB, persistent atrial fib, and obesity. He has gained over 10 lbs in the time of Covid. He admits to being sedentary. He denies syncope. He has been reluctant to exercise. Allergies  Allergen Reactions   Prednisone     REACTION: GI bleed   Sulfonamide Derivatives     REACTION: Rash     Current Outpatient Medications  Medication Sig Dispense Refill   acetaminophen (TYLENOL) 500 MG tablet Take 500 mg by mouth every 6 (six) hours as needed (pain).      allopurinol (ZYLOPRIM) 300 MG tablet Take 1 tablet (300 mg total) by mouth daily. 90 tablet 3   Ascorbic Acid (VITAMIN C PO) Take 1 capsule by mouth daily as needed (supplement).     b complex vitamins capsule Take 1 capsule by mouth daily.     Cholecalciferol (VITAMIN D3) 2000 UNITS capsule Take 1 capsule (2,000 Units total) by mouth daily.     Coenzyme Q10 300 MG CAPS Take 300 mg by mouth daily.     colchicine 0.6 MG tablet Take 0.6 mg by mouth daily as needed (gout).      Cyanocobalamin (VITAMIN B 12 PO) Take 1,000 mg by mouth daily.      FeFum-FePoly-FA-B Cmp-C-Biot (INTEGRA PLUS) CAPS TAKE 1 CAPSULE BY MOUTH EVERY MORNING 90 capsule 0   fexofenadine (ALLEGRA) 180 MG tablet Take 180 mg by mouth daily.      furosemide (LASIX) 20 MG tablet Take 1 tablet (20 mg total) by mouth daily. 90 tablet 2   Omega-3 Fatty Acids (FISH OIL) 600 MG CAPS Take 600 mg by mouth daily.      rivaroxaban (XARELTO) 20 MG TABS tablet TAKE 1 TABLET(20 MG) BY MOUTH DAILY WITH SUPPER 90 tablet 3   tamsulosin (FLOMAX) 0.4 MG CAPS capsule Take 0.4 mg by mouth 2 (two) times daily.   11   No current facility-administered medications for this visit.     Past Medical History:  Diagnosis Date   Anemia    Arthritis    "middle finger both hands" (09/05/2014)   Basal cell carcinoma    Benign prostatic hypertrophy    hx of   Colon polyp 2006, 2010   TUBULAR ADENOMA    Diverticulosis of colon (without mention of hemorrhage)    First degree atrioventricular block    GERD (gastroesophageal reflux disease)    Gout    History of blood transfusion 12/1953; 03/1954   Transfused 7 pints blood '55, 4 pints '56   History of duodenal ulcer 1955   w/hemorrhage   Kidney stones    "passed them"   Morbid obesity ([place])    "I was 294; lost 100# 2014-2015"   MVP (mitral valve prolapse)    OSA (obstructive sleep apnea)    "stopped wearing mask after I lost 100#"   Osteoarthritis    Other thalassemia ([place])    Pneumonia "a number of times"   Presence of permanent cardiac pacemaker    Prostate cancer ([place])     ROS:   All systems reviewed and negative except as noted in the HPI.   Past Surgical History:  Procedure Laterality Date   CATARACT EXTRACTION W/ INTRAOCULAR LENS  IMPLANT, BILATERAL  2002-2004   CLOSED REDUCTION ANKLE DISLOCATION Right 1980's   "toe my achilles"   COLONOSCOPY  2015   multiple    EP IMPLANTABLE DEVICE N/A 09/05/2014   Procedure: Pacemaker Implant;  Surgeon: [name], MD;  Location: [place] [facility];  Service: Cardiovascular;  Laterality: N/A;   ESOPHAGOGASTRODUODENOSCOPY  2011   multiple   GASTRECTOMY  03/1954   Transfused 7 pints blood '55, 4 pints '56   GASTRECTOMY  1956   partial   INSERT / REPLACE / REMOVE PACEMAKER  09/05/2014   MOLE REMOVAL Left 2008   "forearm"   TUMOR REMOVAL Left 2002   from finger- left index     Family History  Problem Relation Age of Onset   Coronary artery disease Father    Heart attack Father    Gout Father    Cancer Daughter        skin   Colon cancer Neg Hx      Social History   Socioeconomic History   Marital status: Married    Spouse name: ANn   Number of children: 8   Years of education: 18+   Highest education level: Not on file  Occupational History   Occupation: executive    Comment: retired  Tobacco Use   Smoking status: Former  Smoker    Packs/day: 3.00    Years: 20.00    Pack years: 60.00    Types: Cigarettes    Start date: 01/21/1970   Smokeless tobacco: Never Used   Tobacco comment: quite smoking cigarettes in [place] Use   Vaping Use: Never used  Substance and Sexual Activity   Alcohol use: No    Comment: "no alcohol since 1987"   Drug use: No   Sexual activity: Yes    Partners: Female  Other Topics Concern   Not on file  Social History Narrative   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 Married '59. Eight Daughters '60, '62, '66, '70, '73, '74, '78, '83; 19 grandchildren. Lives independently with wife. ACP- asked pt to think about these issues (DEc '12)         Social Determinants of Radio broadcast assistant Strain:    Difficulty of Paying Living Expenses:   Food Insecurity:    Worried About Charity fundraiser in the Last Year:    Arboriculturist in the Last Year:   Transportation Needs:    Film/video editor (Medical):    Lack of Transportation (Non-Medical):   Physical Activity:    Days of Exercise per Week:    Minutes of Exercise per Session:   Stress:    Feeling of Stress :   Social Connections:    Frequency of Communication with Friends and Family:    Frequency of Social Gatherings with Friends and Family:    Attends Religious Services:    Active Member of Clubs or Organizations:    Attends Music therapist:    Marital Status:   Intimate Partner Violence:    Fear of Current or Ex-Partner:    Emotionally Abused:    Physically Abused:    Sexually Abused:      BP 110/70    Pulse 83    Ht 5\' 7"  (1.702 m)    Wt 252 lb (114.3 kg)    SpO2 97%    BMI 39.47 kg/m   Physical Exam:  Well appearing NAD HEENT: Unremarkable Neck:  No JVD, no thyromegally Lymphatics:  No adenopathy Back:  No CVA tenderness Lungs:  Clear with no wheezes HEART:  Regular rate rhythm, no murmurs, no rubs, no clicks Abd:  soft, positive bowel  sounds, no organomegally, no rebound, no  guarding Ext:  2 plus pulses, no edema, no cyanosis, no clubbing Skin:  No rashes no nodules Neuro:  CN II through XII intact, motor grossly intact  EKG atrial fib with ventricular pacing  DEVICE  Normal device function.  See PaceArt for details.   Assess/Plan: 1. Atrial fib - his VR is well controlled. He will continue his current meds. 2. PPM - his medtronic DDD PM is working normally. We will recheck in several months. 3. Obesity- he continues to gain weight. I strongly encouraged him to use his stationary bike. 4. CHB - he has no escape today and is pacing 100% of the time.   Mikle Bosworth.D.

## 2019-10-17 ENCOUNTER — Telehealth: Payer: Self-pay | Admitting: Internal Medicine

## 2019-10-17 NOTE — Telephone Encounter (Signed)
Pt is requesting a call back from a nurse to discuss his black stools, pt thinks he is bleeding internally, pt would like to be seen by Dr [name] sometime soon, no appts available.  Last saw Dr [name] 2015

## 2019-10-17 NOTE — Telephone Encounter (Signed)
Pt states he has had black stools for a little over a week, pt is on xarelto. States he is not on any iron and has not taken any pepto. Reports he has had this happen in the past and he was having some bleeding. Requests to be seen. Pt scheduled to see Nicoletta Ba PA tomorrow at 10:30am. Pt aware of appt.

## 2019-10-18 ENCOUNTER — Other Ambulatory Visit: Payer: Self-pay

## 2019-10-18 ENCOUNTER — Inpatient Hospital Stay (HOSPITAL_COMMUNITY)
Admission: AD | Admit: 2019-10-18 | Discharge: 2019-10-20 | DRG: 378 | Disposition: A | Discharge status: Home / Self Care | Payer: Medicare Other | Attending: Internal Medicine | Admitting: Internal Medicine

## 2019-10-18 ENCOUNTER — Ambulatory Visit (INDEPENDENT_AMBULATORY_CARE_PROVIDER_SITE_OTHER): Payer: Medicare Other | Admitting: Physician Assistant

## 2019-10-18 ENCOUNTER — Encounter (HOSPITAL_COMMUNITY): Payer: Self-pay | Admitting: Internal Medicine

## 2019-10-18 ENCOUNTER — Encounter: Payer: Self-pay | Admitting: Physician Assistant

## 2019-10-18 VITALS — BP 122/62 | HR 56 | Ht 67.0 in | Wt 251.4 lb

## 2019-10-18 DIAGNOSIS — K921 Melena: Secondary | ICD-10-CM | POA: Diagnosis present

## 2019-10-18 DIAGNOSIS — N400 Enlarged prostate without lower urinary tract symptoms: Secondary | ICD-10-CM | POA: Diagnosis present

## 2019-10-18 DIAGNOSIS — K2901 Acute gastritis with bleeding: Secondary | ICD-10-CM | POA: Diagnosis present

## 2019-10-18 DIAGNOSIS — Z961 Presence of intraocular lens: Secondary | ICD-10-CM | POA: Diagnosis present

## 2019-10-18 DIAGNOSIS — I442 Atrioventricular block, complete: Secondary | ICD-10-CM | POA: Diagnosis present

## 2019-10-18 DIAGNOSIS — Z888 Allergy status to other drugs, medicaments and biological substances status: Secondary | ICD-10-CM

## 2019-10-18 DIAGNOSIS — Z87442 Personal history of urinary calculi: Secondary | ICD-10-CM | POA: Diagnosis not present

## 2019-10-18 DIAGNOSIS — Z79899 Other long term (current) drug therapy: Secondary | ICD-10-CM

## 2019-10-18 DIAGNOSIS — I4891 Unspecified atrial fibrillation: Secondary | ICD-10-CM | POA: Diagnosis present

## 2019-10-18 DIAGNOSIS — Z882 Allergy status to sulfonamides status: Secondary | ICD-10-CM

## 2019-10-18 DIAGNOSIS — Z85828 Personal history of other malignant neoplasm of skin: Secondary | ICD-10-CM

## 2019-10-18 DIAGNOSIS — K219 Gastro-esophageal reflux disease without esophagitis: Secondary | ICD-10-CM | POA: Diagnosis present

## 2019-10-18 DIAGNOSIS — G4733 Obstructive sleep apnea (adult) (pediatric): Secondary | ICD-10-CM | POA: Diagnosis present

## 2019-10-18 DIAGNOSIS — D509 Iron deficiency anemia, unspecified: Secondary | ICD-10-CM | POA: Diagnosis present

## 2019-10-18 DIAGNOSIS — I4819 Other persistent atrial fibrillation: Secondary | ICD-10-CM

## 2019-10-18 DIAGNOSIS — D62 Acute posthemorrhagic anemia: Secondary | ICD-10-CM

## 2019-10-18 DIAGNOSIS — M1990 Unspecified osteoarthritis, unspecified site: Secondary | ICD-10-CM | POA: Diagnosis present

## 2019-10-18 DIAGNOSIS — M109 Gout, unspecified: Secondary | ICD-10-CM | POA: Diagnosis present

## 2019-10-18 DIAGNOSIS — D569 Thalassemia, unspecified: Secondary | ICD-10-CM | POA: Diagnosis present

## 2019-10-18 DIAGNOSIS — K2970 Gastritis, unspecified, without bleeding: Secondary | ICD-10-CM | POA: Diagnosis not present

## 2019-10-18 DIAGNOSIS — D568 Other thalassemias: Secondary | ICD-10-CM

## 2019-10-18 DIAGNOSIS — Z6839 Body mass index (BMI) 39.0-39.9, adult: Secondary | ICD-10-CM | POA: Diagnosis not present

## 2019-10-18 DIAGNOSIS — Z9842 Cataract extraction status, left eye: Secondary | ICD-10-CM

## 2019-10-18 DIAGNOSIS — Z20822 Contact with and (suspected) exposure to covid-19: Secondary | ICD-10-CM | POA: Diagnosis present

## 2019-10-18 DIAGNOSIS — Z950 Presence of cardiac pacemaker: Secondary | ICD-10-CM | POA: Diagnosis not present

## 2019-10-18 DIAGNOSIS — Z86010 Personal history of colonic polyps: Secondary | ICD-10-CM | POA: Diagnosis not present

## 2019-10-18 DIAGNOSIS — Z8546 Personal history of malignant neoplasm of prostate: Secondary | ICD-10-CM

## 2019-10-18 DIAGNOSIS — Z7901 Long term (current) use of anticoagulants: Secondary | ICD-10-CM | POA: Diagnosis not present

## 2019-10-18 DIAGNOSIS — Z8249 Family history of ischemic heart disease and other diseases of the circulatory system: Secondary | ICD-10-CM

## 2019-10-18 DIAGNOSIS — Z9841 Cataract extraction status, right eye: Secondary | ICD-10-CM

## 2019-10-18 DIAGNOSIS — N401 Enlarged prostate with lower urinary tract symptoms: Secondary | ICD-10-CM

## 2019-10-18 DIAGNOSIS — Z903 Acquired absence of stomach [part of]: Secondary | ICD-10-CM

## 2019-10-18 DIAGNOSIS — Z87891 Personal history of nicotine dependence: Secondary | ICD-10-CM

## 2019-10-18 LAB — PROTIME-INR
INR: 1.3 — ABNORMAL HIGH (ref 0.8–1.2)
Prothrombin Time: 15.3 seconds — ABNORMAL HIGH (ref 11.4–15.2)

## 2019-10-18 LAB — CBC
HCT: 27.9 % — ABNORMAL LOW (ref 39.0–52.0)
Hemoglobin: 8.8 g/dL — ABNORMAL LOW (ref 13.0–17.0)
MCH: 19.9 pg — ABNORMAL LOW (ref 26.0–34.0)
MCHC: 31.5 g/dL (ref 30.0–36.0)
MCV: 63.1 fL — ABNORMAL LOW (ref 80.0–100.0)
Platelets: 155 10*3/uL (ref 150–400)
RBC: 4.42 MIL/uL (ref 4.22–5.81)
RDW: 20.4 % — ABNORMAL HIGH (ref 11.5–15.5)
WBC: 6.6 10*3/uL (ref 4.0–10.5)
nRBC: 0.6 % — ABNORMAL HIGH (ref 0.0–0.2)

## 2019-10-18 LAB — COMPREHENSIVE METABOLIC PANEL
ALT: 13 U/L (ref 0–44)
AST: 19 U/L (ref 15–41)
Albumin: 3.9 g/dL (ref 3.5–5.0)
Alkaline Phosphatase: 67 U/L (ref 38–126)
Anion gap: 9 (ref 5–15)
BUN: 28 mg/dL — ABNORMAL HIGH (ref 8–23)
CO2: 26 mmol/L (ref 22–32)
Calcium: 9.1 mg/dL (ref 8.9–10.3)
Chloride: 105 mmol/L (ref 98–111)
Creatinine, Ser: 0.97 mg/dL (ref 0.61–1.24)
GFR calc Af Amer: >60 mL/min (ref >60)
GFR calc non Af Amer: >60 mL/min (ref >60)
Glucose, Bld: 112 mg/dL — ABNORMAL HIGH (ref 70–99)
Potassium: 4.5 mmol/L (ref 3.5–5.1)
Sodium: 140 mmol/L (ref 135–145)
Total Bilirubin: 1.6 mg/dL — ABNORMAL HIGH (ref 0.3–1.2)
Total Protein: 6.6 g/dL (ref 6.5–8.1)

## 2019-10-18 LAB — SARS CORONAVIRUS 2 BY RT PCR (HOSPITAL ORDER, PERFORMED IN ~~LOC~~ HOSPITAL LAB): SARS Coronavirus 2: NEGATIVE

## 2019-10-18 LAB — APTT: aPTT: 46 seconds — ABNORMAL HIGH (ref 24–36)

## 2019-10-18 MED ORDER — SODIUM CHLORIDE 0.9 % IV SOLN: INTRAVENOUS | Status: DC

## 2019-10-18 MED ORDER — SODIUM CHLORIDE 0.9 % IV SOLN: INTRAVENOUS | Status: AC

## 2019-10-18 MED ORDER — ONDANSETRON HCL 4 MG PO TABS: 4.0000 mg | ORAL_TABLET | Freq: Four times a day (QID) | ORAL | Status: DC | PRN

## 2019-10-18 MED ORDER — ACETAMINOPHEN 325 MG PO TABS: 650.0000 mg | ORAL_TABLET | Freq: Four times a day (QID) | ORAL | Status: DC | PRN

## 2019-10-18 MED ORDER — TAMSULOSIN HCL 0.4 MG PO CAPS
0.4000 mg | ORAL_CAPSULE | Freq: Every day | ORAL | Status: DC
  Administered 2019-10-18 – 2019-10-19 (×2): 0.4 mg via ORAL
  Filled 2019-10-18 (×2): qty 1

## 2019-10-18 MED ORDER — ACETAMINOPHEN 650 MG RE SUPP: 650.0000 mg | Freq: Four times a day (QID) | RECTAL | Status: DC | PRN

## 2019-10-18 MED ORDER — ONDANSETRON HCL 4 MG/2ML IJ SOLN: 4.0000 mg | Freq: Four times a day (QID) | INTRAMUSCULAR | Status: DC | PRN

## 2019-10-18 MED ORDER — PANTOPRAZOLE SODIUM 40 MG IV SOLR
40.0000 mg | Freq: Two times a day (BID) | INTRAVENOUS | Status: DC
  Administered 2019-10-18 – 2019-10-20 (×5): 40 mg via INTRAVENOUS
  Filled 2019-10-18 (×5): qty 40

## 2019-10-18 NOTE — H&P (View-Only) (Signed)
    Progress Note Aware of patient's arrival.  Assessment: 1. Melena 2. Chronic anticoagulation with Xarelto 3. History of heart block s/p pacemaker 4. History of thalassemia and iron deficiency 5. Obstructive sleep apnea with CPAP 6. H/o adenomatous colon polyps and diverticulosis- last colon 2015  Plan: 1. Continue to hold Xarelto 2. EGD scheduled for tomorrow with Dr. Ardis Hughs. Did discuss risks, benefits, limitations and alternatives and the patient agrees to proceed 3. Continue to follow hgb q8h with transfusion as needed <8 4. Patient can be on regular diet today and then NPO after midnight 5. Please await further recommendations after procedure tomorrow  Thank you for your kind consultation, we will continue to follow.   Lavone Nian [facility]  10/18/2019, 2:29 PM   ________________________________________________________________________  Velora Heckler GI MD note:  I personally examined the patient, reviewed the data and agree with the assessment and plan described above.  Hb was 8.8 (10.3 one year ago).  He understands that if EGD tomorrow fails to clearly explain his melena then he will probably need further testing.   Owens Loffler, MD [facility] Gastroenterology Pager [phone]

## 2019-10-18 NOTE — Progress Notes (Signed)
Assessment and plan noted.  I did see the patient in the room after PA evaluation

## 2019-10-18 NOTE — Progress Notes (Signed)
Subjective:    Patient ID: Jacob [name], male    DOB: [birth year], 86 y.o.   MRN: [id number]  HPI [name] is a pleasant 86 year old white male, established with Dr. [name] who comes in today with complaints of black stool over the past 5 to 6 days.  He is on Xarelto for history of atrial fib flutter, also has history of complete heart block and is status post pacemaker placement, has history of hypertension, obstructive sleep apnea with CPAP use, history of thalassemia and iron deficiency maintained on oral iron supplement.  He has history of remote GI bleed secondary to peptic ulcer disease in the 1950s and underwent a Billroth II gastrojejunostomy at that time.  He has not had a GI bleed to his knowledge since then. He did have EGD here in 2011 which was negative with Billroth II anatomy. He had colonoscopy last in April 2015 for history of adenomatous colon polyps.  He was noted to have severe diverticulosis in the left colon, moderate diverticulosis in the right, no recurrent polyps. Patient says he has seen very black stool, no gross blood.  He did not have a bowel movement today as yet.  He complains of feeling somewhat tired but denies dizziness or lightheadedness.  He did get somewhat lightheaded when he stood to get up on the exam table however. He denies any abdominal pain, no nausea or vomiting no heartburn or indigestion and bowel movements have been regular for him which is mildly constipated. He had not been started on any new medications, no NSAID use and no regular aspirin use. Last labs visible in epic are from December 2019 with hemoglobin 10.3 hematocrit of 32 and MCV of 63.  Hemodynamically stable in the office today  Review of Systems Pertinent positive and negative review of systems were noted in the above HPI section.  All other review of systems was otherwise negative.  Outpatient Encounter Medications as of 10/18/2019  Medication Sig  . acetaminophen (TYLENOL) 500 MG  tablet Take 500 mg by mouth every 6 (six) hours as needed (pain).   Marland Kitchen allopurinol (ZYLOPRIM) 300 MG tablet Take 1 tablet (300 mg total) by mouth daily.  . Ascorbic Acid (VITAMIN C PO) Take 1 capsule by mouth daily as needed (supplement).  Marland Kitchen b complex vitamins capsule Take 1 capsule by mouth daily.  . Cholecalciferol (VITAMIN D3) 2000 UNITS capsule Take 1 capsule (2,000 Units total) by mouth daily.  . Coenzyme Q10 300 MG CAPS Take 300 mg by mouth daily.  . colchicine 0.6 MG tablet Take 0.6 mg by mouth daily as needed (gout).   . Cyanocobalamin (VITAMIN B 12 PO) Take 1,000 mg by mouth daily.   Marland Kitchen FeFum-FePoly-FA-B Cmp-C-Biot (INTEGRA PLUS) CAPS TAKE 1 CAPSULE BY MOUTH EVERY MORNING  . fexofenadine (ALLEGRA) 180 MG tablet Take 180 mg by mouth daily.   . furosemide (LASIX) 20 MG tablet Take 1 tablet (20 mg total) by mouth daily.  . rivaroxaban (XARELTO) 20 MG TABS tablet TAKE 1 TABLET(20 MG) BY MOUTH DAILY WITH SUPPER  . tamsulosin (FLOMAX) 0.4 MG CAPS capsule Take 0.4 mg by mouth daily.   . [DISCONTINUED] FeFum-FePoly-FA-B Cmp-C-Biot (INTEGRA PLUS) CAPS TAKE 1 CAPSULE BY MOUTH EVERY MORNING  . [DISCONTINUED] Omega-3 Fatty Acids (FISH OIL) 600 MG CAPS Take 600 mg by mouth daily.  (Patient not taking: Reported on 10/18/2019)   No facility-administered encounter medications on file as of 10/18/2019.   Allergies  Allergen Reactions  . Prednisone  REACTION: GI bleed  . Sulfa Antibiotics Rash and Other (See Comments)    "GI impact"   Patient Active Problem List   Diagnosis Date Noted  . Atrial flutter ([place]) 03/28/2019  . Bradycardia 03/28/2019  . Educated about COVID-19 virus infection 03/28/2019  . Pseudophakia of both eyes 02/19/2015  . Posterior vitreous detachment of right eye 02/19/2015  . Retinal break of right eye 02/19/2015  . Right posterior capsular opacification 01/21/2015  . Squamous blepharitis of upper eyelids of both eyes 01/21/2015  . Epiretinal membrane (ERM) of right eye  01/21/2015  . Vitreous floaters of right eye 01/21/2015  . Atrial fibrillation ([place]) 09/13/2014  . Pacemaker 09/05/2014  . Complete heart block ([place]) 08/07/2014  . Unspecified constipation 09/05/2013  . Iron deficiency anemia 02/28/2013  . Need for prophylactic vaccination and inoculation against influenza 12/31/2011  . Dermatochalasis 12/11/2011  . Pseudophakia 12/11/2011  . Acute bronchitis 02/13/2011  . Routine health maintenance 01/20/2011  . FLATULENCE-GAS-BLOATING 10/14/2009  . GERD 09/18/2009  . PERSONAL HX COLONIC POLYPS 09/18/2009  . PALPITATIONS 11/20/2008  . MURMUR 11/20/2008  . LABRYNTHITIS 09/26/2008  . HEMATURIA UNSPECIFIED 07/04/2008  . DIVERTICULITIS, HX OF 07/04/2008  . COUGH 05/24/2008  . Gout, unspecified 08/29/2007  . Essential hypertension 08/29/2007  . CHRONIC RHINITIS 08/29/2007  . BENIGN PROSTATIC HYPERTROPHY 08/29/2007  . OSTEOARTHRITIS 08/29/2007  . INTERMITTENT VERTIGO 08/29/2007  . DYSLIPIDEMIA 12/08/2006  . THALASSEMIA NEC 12/08/2006  . Mitral valve disorder 12/08/2006  . BLOCK, AV, 1ST DEGREE 12/08/2006  . PEPTIC ULCER DISEASE 12/08/2006  . DIVERTICULOSIS, COLON 12/08/2006  . OSA on CPAP 12/08/2006  . CATARACT, HX OF 12/08/2006  . NEPHROLITHIASIS, HX OF 12/08/2006  . MORBID OBESITY, HX OF 12/08/2006  . GASTRECTOMY, HX OF 12/08/2006  . CATARACT EXTRACTION, HX OF 12/08/2006   Social History   Socioeconomic History  . Marital status: Married    Spouse name: ANn  . Number of children: 8  . Years of education: 18+  . Highest education level: Not on file  Occupational History  . Occupation: executive    Comment: retired  Tobacco Use  . Smoking status: Former Smoker    Packs/day: 3.00    Years: 20.00    Pack years: 60.00    Types: Cigarettes    Start date: 01/21/1970  . Smokeless tobacco: Never Used  . Tobacco comment: quite smoking cigarettes in 1971  Vaping Use  . Vaping Use: Never used  Substance and Sexual Activity  . Alcohol  use: No    Comment: "no alcohol since 1987"  . Drug use: No  . Sexual activity: Yes    Partners: Female  Other Topics Concern  . 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 Married '59. Eight Daughters '60, '62, '66, '70, '73, '74, '78, '83; 33 grandchildren 1great grandchild. Lives independently with wife. ACP- asked pt to think about these issues (DEc '12)      Social Determinants of Health   Financial Resource Strain:   . Difficulty of Paying Living Expenses:   Food Insecurity:   . Worried About Charity fundraiser in the Last Year:   . Arboriculturist in the Last Year:   Transportation Needs:   . Film/video editor (Medical):   Marland Kitchen Lack of Transportation (Non-Medical):   Physical Activity:   . Days of Exercise per Week:   . Minutes of Exercise per Session:   Stress:   . Feeling of  Stress :   Social Connections:   . Frequency of Communication with Friends and Family:   . Frequency of Social Gatherings with Friends and Family:   . Attends Religious Services:   . Active Member of Clubs or Organizations:   . Attends Archivist Meetings:   Marland Kitchen Marital Status:   Intimate Partner Violence:   . Fear of Current or Ex-Partner:   . Emotionally Abused:   Marland Kitchen Physically Abused:   . Sexually Abused:     Mr. Camargo family history includes Cancer in his daughter; Coronary artery disease in his father; Gout in his father; Heart attack in his father; Stomach cancer in his paternal aunt and paternal grandmother.      Objective:    Vitals:   10/18/19 1040  BP: 122/62  Pulse: (!) 56  SpO2: 97%    Physical Exam Well-developed well-nourished elderly W /male in no acute distress.  Height, Weight,251 BMI 39.5 accompanied by his wife.  Patient was lightheaded on standing from sitting position  HEENT; nontraumatic normocephalic, EOMI, PE RR LA, sclera anicteric. Oropharynx; not examined Neck; supple, no JVD Cardiovascular;  regular rate and rhythm with S1-S2, no murmur rub or gallop, pacemaker left chest wall Pulmonary; Clear bilaterally Abdomen; soft, nontender, nondistended, no palpable mass or hepatosplenomegaly, bowel sounds are active, midline incisional scar Rectal; black stool, heme positive Skin; benign exam, no jaundice rash or appreciable lesions Extremities; no clubbing cyanosis or edema skin warm and dry Neuro/Psych; alert and oriented x4, grossly nonfocal mood and affect appropriate       Assessment & Plan:   #[age] year old white male with GI bleed, in setting of Xarelto.  Patient presents with 5 to 6-day history of melena.  No associated abdominal discomfort nausea change in bowel habits etc. Given his history of Billroth II remotely, suspect peptic ulcer disease/anastomotic ulcer, cannot rule out neoplasm, rule out AVMs. 2.  Chronic anticoagulation secondary to atrial fib flutter 3.  History of heart block status post pacemaker 4.  History of thalassemia and iron deficiency had been followed by hematology in the past/Dr. Earlie Server, on chronic Integra plus.  No recent labs 5.  Obstructive sleep apnea with CPAP use 6.  History of adenomatous colon polyps and diverticulosis-no recurrent polyps at colonoscopy 2015  Plan; patient will be admitted to [facility].  I have communicated with the hospitalist/Dr. Maryland Pink who has accepted patient.  Of also communicated with Dr. Ardis Hughs who is covering the hospital for our service this week. Patient has not taken Xarelto today, continue to hold Xarelto Trend hemoglobin and transfuse for hemoglobin less than 8 given advanced age and multiple comorbidities Expect he will undergo EGD tomorrow, and if no source found on EGD then consider colonoscopy.    Tresea Heine Genia Harold PA-C 10/18/2019   Cc: [name], [name], MD

## 2019-10-18 NOTE — Progress Notes (Addendum)
    Progress Note Aware of patient's arrival.  Assessment: 1. Melena 2. Chronic anticoagulation with Xarelto 3. History of heart block s/p pacemaker 4. History of thalassemia and iron deficiency 5. Obstructive sleep apnea with CPAP 6. H/o adenomatous colon polyps and diverticulosis- last colon 2015  Plan: 1. Continue to hold Xarelto 2. EGD scheduled for tomorrow with Dr. Ardis Hughs. Did discuss risks, benefits, limitations and alternatives and the patient agrees to proceed 3. Continue to follow hgb q8h with transfusion as needed <8 4. Patient can be on regular diet today and then NPO after midnight 5. Please await further recommendations after procedure tomorrow  Thank you for your kind consultation, we will continue to follow.   Lavone Nian [facility]  10/18/2019, 2:29 PM   ________________________________________________________________________  Velora Heckler GI MD note:  I personally examined the patient, reviewed the data and agree with the assessment and plan described above.  Hb was 8.8 (10.3 one year ago).  He understands that if EGD tomorrow fails to clearly explain his melena then he will probably need further testing.   Owens Loffler, MD [facility] Pager [phone]

## 2019-10-18 NOTE — H&P (Signed)
[facility] History and Physical  Jacob [name] WJX:[id number] DOB: [birth year] DOA: 10/18/2019   PCP: [name], MD  Specialists: Dr. [name] is his gastroenterologist.  Dr. Percival Spanish is his cardiologist and Dr. Lovena Le is his electrophysiologist.  Chief Complaint: Black stool for the last 5 days  HPI: Jacob [name] is a 86 y.o. male with a past medical history of gout, BPH, complete heart block status post pacemaker, history of atrial fibrillation on Xarelto who was in his usual state of health till about 5 days ago when he started noticing black stool.  He denies that it was tarry.  He denies any changes in his medications recently.  No heartburn or acid reflux.  Has not taken iron supplements or Pepto-Bismol recently.  Denies any abdominal pain.  Has been feeling somewhat fatigued.  He originally went to his gastroenterologist office from where he was sent to [facility] for direct admission.  He denies any nausea vomiting.  Has not noticed any red blood in the stool.  Denies any dizziness or lightheadedness.  No chest pain or shortness of breath.  No fever or chills.  Home Medications: Prior to Admission medications   Medication Sig Start Date End Date Taking? Authorizing Provider  allopurinol (ZYLOPRIM) 300 MG tablet Take 1 tablet (300 mg total) by mouth daily. 05/04/13  Yes Norins, Heinz Knuckles, MD  Ascorbic Acid (VITAMIN C PO) Take 1 capsule by mouth every other day.    Yes [provider]  b complex vitamins capsule Take 1 capsule by mouth daily.   Yes [provider]  Cholecalciferol (VITAMIN D3) 2000 UNITS capsule Take 1 capsule (2,000 Units total) by mouth daily. 11/02/12  Yes Norins, Heinz Knuckles, MD  Coenzyme Q10 300 MG CAPS Take 300 mg by mouth daily.   Yes [provider]  Cyanocobalamin (VITAMIN B 12 PO) Take 1,000 mg by mouth daily.    Yes [provider]  FeFum-FePoly-FA-B Cmp-C-Biot (INTEGRA PLUS) CAPS TAKE 1 CAPSULE  BY MOUTH EVERY MORNING 07/17/19  Yes Curt Bears, MD  fexofenadine (ALLEGRA) 180 MG tablet Take 180 mg by mouth daily.    Yes [provider]  furosemide (LASIX) 20 MG tablet Take 1 tablet (20 mg total) by mouth daily. 02/01/19  Yes [name], MD  Omega-3 Fatty Acids (FISH OIL) 600 MG CAPS Take 600 mg by mouth daily.   Yes [provider]  rivaroxaban (XARELTO) 20 MG TABS tablet TAKE 1 TABLET(20 MG) BY MOUTH DAILY WITH SUPPER Patient taking differently: Take 20 mg by mouth daily with supper. TAKE 1 TABLET(20 MG) BY MOUTH DAILY WITH SUPPER 03/29/19  Yes Hochrein, Jeneen Rinks, MD  tamsulosin (FLOMAX) 0.4 MG CAPS capsule Take 0.4 mg by mouth daily.  11/13/13  Yes [provider]  FeFum-FePoly-FA-B Cmp-C-Biot (INTEGRA PLUS) CAPS TAKE 1 CAPSULE BY MOUTH EVERY MORNING 09/06/18   Curt Bears, MD    Allergies:  Allergies  Allergen Reactions  . Prednisone     REACTION: GI bleed  . Sulfa Antibiotics Rash and Other (See Comments)    "GI impact"    Past Medical History: Past Medical History:  Diagnosis Date  . Anemia   . Arthritis    "middle finger both hands" (09/05/2014)  . Basal cell carcinoma   . Benign prostatic hypertrophy    hx of  . Colon polyp 2006, 2010   TUBULAR ADENOMA  . Diverticulosis of colon (without mention of hemorrhage)   . First degree atrioventricular block   .  GERD (gastroesophageal reflux disease)   . Gout   . History of blood transfusion 12/1953; 03/1954   Transfused 7 pints blood '55, 4 pints '56  . History of duodenal ulcer 1955   w/hemorrhage  . Kidney stones    "passed them"  . Morbid obesity ([place])    "I was 294; lost 100# 2014-2015"  . MVP (mitral valve prolapse)   . OSA (obstructive sleep apnea)    "stopped wearing mask after I lost 100#"  . Osteoarthritis   . Other thalassemia ([place])   . Pneumonia "a number of times"  . Presence of permanent cardiac pacemaker   . Prostate cancer [facility])     Past Surgical History:    Procedure Laterality Date  . CATARACT EXTRACTION W/ INTRAOCULAR LENS  IMPLANT, BILATERAL  2002-2004  . CLOSED REDUCTION ANKLE DISLOCATION Right 1980's   "toe my achilles"  . COLONOSCOPY  2015   multiple   . EP IMPLANTABLE DEVICE N/A 09/05/2014   Procedure: Pacemaker Implant;  Surgeon: [name], MD;  Location: [facility];  Service: Cardiovascular;  Laterality: N/A;  . ESOPHAGOGASTRODUODENOSCOPY  2011   multiple  . GASTRECTOMY  03/1954   Transfused 7 pints blood '55, 4 pints '56  . GASTRECTOMY  1956   partial  . INSERT / REPLACE / REMOVE PACEMAKER  09/05/2014  . MOLE REMOVAL Left 2008   "forearm"  . TUMOR REMOVAL Left 2002   from finger- left index    Social History: Lives in [place].  Former smoker quit in the 70s.  Former drinker of alcohol, quit in the 80s.  Family History:  Family History  Problem Relation Age of Onset  . Coronary artery disease Father   . Heart attack Father   . Gout Father   . Stomach cancer Paternal Grandmother   . Cancer Daughter        skin  . Stomach cancer Paternal Aunt   . Colon cancer Neg Hx   . Esophageal cancer Neg Hx   . Pancreatic cancer Neg Hx      Review of Systems - History obtained from the patient General ROS: positive for  - fatigue Psychological ROS: positive for - anxiety Ophthalmic ROS: negative ENT ROS: negative Allergy and Immunology ROS: negative Hematological and Lymphatic ROS: negative Endocrine ROS: negative Respiratory ROS: no cough, shortness of breath, or wheezing Cardiovascular ROS: no chest pain or dyspnea on exertion Gastrointestinal ROS: As in HPI Genito-Urinary ROS: no dysuria, trouble voiding, or hematuria Musculoskeletal ROS: negative Neurological ROS: no TIA or stroke symptoms Dermatological ROS: negative  Physical Examination  Vitals:   10/18/19 1412  BP: (!) 128/59  Pulse: 64  Resp: 18  Temp: 97.9 F (36.6 C)  TempSrc: Oral  SpO2: 98%    BP (!) 128/59 (BP Location: Left Arm)    Pulse 64   Temp 97.9 F (36.6 C) (Oral)   Resp 18   SpO2 98%   General appearance: alert, cooperative, appears stated age and no distress Head: Normocephalic, without obvious abnormality, atraumatic Eyes: conjunctivae/corneas clear. PERRL, EOM's intact.  Throat: lips, mucosa, and tongue normal; teeth and gums normal Neck: no adenopathy, no carotid bruit, no JVD, supple, symmetrical, trachea midline and thyroid not enlarged, symmetric, no tenderness/mass/nodules Resp: clear to auscultation bilaterally Cardio: regular rate and rhythm, S1, S2 normal, no murmur, click, rub or gallop GI: soft, non-tender; bowel sounds normal; no masses,  no organomegaly Extremities: extremities normal, atraumatic, no cyanosis or edema Pulses: 2+ and symmetric  Skin: Skin color, texture, turgor normal. No rashes or lesions Lymph nodes: Cervical, supraclavicular, and axillary nodes normal. Neurologic: Alert and oriented x3.  No focal neurological deficits    Labs on Admission: I have personally reviewed following labs and imaging studies  CBC: Recent Labs  Lab 10/18/19 1439  WBC 6.6  HGB 8.8*  HCT 27.9*  MCV 63.1*  PLT 094   Basic Metabolic Panel: Recent Labs  Lab 10/18/19 1439  NA 140  K 4.5  CL 105  CO2 26  GLUCOSE 112*  BUN 28*  CREATININE 0.97  CALCIUM 9.1   GFR: Estimated Creatinine Clearance: 68.4 mL/min (by C-G formula based on SCr of 0.97 mg/dL). Liver Function Tests: Recent Labs  Lab 10/18/19 1439  AST 19  ALT 13  ALKPHOS 67  BILITOT 1.6*  PROT 6.6  ALBUMIN 3.9   Coagulation Profile: Recent Labs  Lab 10/18/19 1439  INR 1.3*     Radiological Exams on Admission: No results found.    Problem List  Principal Problem:   Melena Active Problems:   Gout, unspecified   BPH (benign prostatic hyperplasia)   Pacemaker   Atrial fibrillation (HCC)   Assessment: This is a 86 year old Caucasian male with past medical history as stated earlier who comes in with a  5-day history of black stool.  Concern is for upper GI bleed.  He has a remote history of ulcer disease many years ago but not recently.  Differential diagnosis is broad and includes peptic ulcer disease, AVM, gastritis, tumor.  Plan:  1. Melanotic stools: Patient will be admitted to the hospital.  Blood work will be done to see if there is any significant anemia.  His anticoagulation will be held.  He will be placed on PPI.  Gastroenterology is already aware of this patient and they will see him later today and plan for endoscopy tomorrow.  He will be kept empty stomach except for sips with medications and ice chips.  2. Acute blood loss anemia: Hemoglobin noted to be 8.8.  MCV 63.  Last labs available in our system is from 2019 when his hemoglobin was 10.3 and MCV was 63.8.  Recheck labs tomorrow.  No indication for transfusion at this time.  3.  History of complete heart block status post pacemaker: Seems to be stable.  4.  History of persistent atrial fibrillation: Has a pacemaker.  He is on Xarelto.  Anticoagulation will be held due to concern for GI bleed.  5.  History of gout: On allopurinol at home.  6.  History of BPH: On Flomax at home which will be continued.   DVT Prophylaxis: SCDs Code Status: Full code Family Communication: Discussed with the patient and his daughter Disposition: Hopefully return home when improved Consults called: [facility] Admission Status: Status is: Inpatient  Remains inpatient appropriate because:Ongoing diagnostic testing needed not appropriate for outpatient work up and IV treatments appropriate due to intensity of illness or inability to take PO   Dispo: The patient is from: Home              Anticipated d/c is to: Home              Anticipated d/c date is: 2 days              Patient currently is not medically stable to d/c.     Severity of Illness: The appropriate patient status for this patient is INPATIENT. Inpatient status is  judged to be reasonable and  necessary in order to provide the required intensity of service to ensure the patient's safety. The patient's presenting symptoms, physical exam findings, and initial radiographic and laboratory data in the context of their chronic comorbidities is felt to place them at high risk for further clinical deterioration. Furthermore, it is not anticipated that the patient will be medically stable for discharge from the hospital within 2 midnights of admission. The following factors support the patient status of inpatient.   " The patient's presenting symptoms include black-colored stool. " The worrisome physical exam findings include unremarkable. " The initial radiographic and laboratory data are worrisome because of labs are pending. " The chronic co-morbidities include atrial fibrillation.   * I certify that at the point of admission it is my clinical judgment that the patient will require inpatient hospital care spanning beyond 2 midnights from the point of admission due to high intensity of service, high risk for further deterioration and high frequency of surveillance required.*  Further management decisions will depend on results of further testing and patient's response to treatment.   Carron Mcmurry [employer]  [facility] operational officer on [email or web address]  10/18/2019, 3:32 PM

## 2019-10-19 ENCOUNTER — Encounter (HOSPITAL_COMMUNITY): Payer: Self-pay | Admitting: Internal Medicine

## 2019-10-19 ENCOUNTER — Encounter (HOSPITAL_COMMUNITY)
Admission: AD | Disposition: A | Discharge status: Home / Self Care | Payer: Self-pay | Source: Home / Self Care | Attending: Internal Medicine

## 2019-10-19 ENCOUNTER — Inpatient Hospital Stay (HOSPITAL_COMMUNITY): Payer: Medicare Other | Admitting: Registered Nurse

## 2019-10-19 DIAGNOSIS — K921 Melena: Secondary | ICD-10-CM

## 2019-10-19 DIAGNOSIS — K2970 Gastritis, unspecified, without bleeding: Secondary | ICD-10-CM

## 2019-10-19 HISTORY — PX: ESOPHAGOGASTRODUODENOSCOPY (EGD) WITH PROPOFOL: SHX5813

## 2019-10-19 HISTORY — PX: BIOPSY: SHX5522

## 2019-10-19 HISTORY — PX: HOT HEMOSTASIS: SHX5433

## 2019-10-19 HISTORY — PX: HEMOSTASIS CONTROL: SHX6838

## 2019-10-19 LAB — COMPREHENSIVE METABOLIC PANEL
ALT: 14 U/L (ref 0–44)
AST: 17 U/L (ref 15–41)
Albumin: 3.5 g/dL (ref 3.5–5.0)
Alkaline Phosphatase: 58 U/L (ref 38–126)
Anion gap: 6 (ref 5–15)
BUN: 32 mg/dL — ABNORMAL HIGH (ref 8–23)
CO2: 25 mmol/L (ref 22–32)
Calcium: 8.9 mg/dL (ref 8.9–10.3)
Chloride: 107 mmol/L (ref 98–111)
Creatinine, Ser: 1.03 mg/dL (ref 0.61–1.24)
GFR calc Af Amer: >60 mL/min (ref >60)
GFR calc non Af Amer: >60 mL/min (ref >60)
Glucose, Bld: 113 mg/dL — ABNORMAL HIGH (ref 70–99)
Potassium: 4.6 mmol/L (ref 3.5–5.1)
Sodium: 138 mmol/L (ref 135–145)
Total Bilirubin: 1.6 mg/dL — ABNORMAL HIGH (ref 0.3–1.2)
Total Protein: 5.7 g/dL — ABNORMAL LOW (ref 6.5–8.1)

## 2019-10-19 LAB — ABO/RH: ABO/RH(D): A POS

## 2019-10-19 LAB — PREPARE RBC (CROSSMATCH)

## 2019-10-19 LAB — CBC
HCT: 25.2 % — ABNORMAL LOW (ref 39.0–52.0)
Hemoglobin: 7.6 g/dL — ABNORMAL LOW (ref 13.0–17.0)
MCH: 19.4 pg — ABNORMAL LOW (ref 26.0–34.0)
MCHC: 30.2 g/dL (ref 30.0–36.0)
MCV: 64.3 fL — ABNORMAL LOW (ref 80.0–100.0)
Platelets: 143 10*3/uL — ABNORMAL LOW (ref 150–400)
RBC: 3.92 MIL/uL — ABNORMAL LOW (ref 4.22–5.81)
RDW: 20 % — ABNORMAL HIGH (ref 11.5–15.5)
WBC: 5.9 10*3/uL (ref 4.0–10.5)
nRBC: 0.5 % — ABNORMAL HIGH (ref 0.0–0.2)

## 2019-10-19 LAB — HEMOGLOBIN AND HEMATOCRIT, BLOOD
HCT: 28.5 % — ABNORMAL LOW (ref 39.0–52.0)
Hemoglobin: 8.9 g/dL — ABNORMAL LOW (ref 13.0–17.0)

## 2019-10-19 LAB — VITAMIN B12: Vitamin B-12: 1863 pg/mL — ABNORMAL HIGH (ref 180–914)

## 2019-10-19 LAB — PROTIME-INR
INR: 1.1 (ref 0.8–1.2)
Prothrombin Time: 13.9 seconds (ref 11.4–15.2)

## 2019-10-19 LAB — FERRITIN: Ferritin: 12 ng/mL — ABNORMAL LOW (ref 24–336)

## 2019-10-19 LAB — IRON AND TIBC
Iron: 37 ug/dL — ABNORMAL LOW (ref 45–182)
Saturation Ratios: 9 % — ABNORMAL LOW (ref 17.9–39.5)
TIBC: 394 ug/dL (ref 250–450)
UIBC: 357 ug/dL

## 2019-10-19 SURGERY — ESOPHAGOGASTRODUODENOSCOPY (EGD) WITH PROPOFOL
Anesthesia: Monitor Anesthesia Care

## 2019-10-19 MED ORDER — PROPOFOL 500 MG/50ML IV EMUL
INTRAVENOUS | Status: DC | PRN
  Administered 2019-10-19: 130 ug/kg/min via INTRAVENOUS

## 2019-10-19 MED ORDER — SODIUM CHLORIDE 0.9% IV SOLUTION: Freq: Once | INTRAVENOUS | Status: DC

## 2019-10-19 MED ORDER — SENNOSIDES-DOCUSATE SODIUM 8.6-50 MG PO TABS: 2.0000 | ORAL_TABLET | Freq: Every evening | ORAL | Status: DC | PRN

## 2019-10-19 MED ORDER — LACTATED RINGERS IV SOLN: INTRAVENOUS | Status: DC | PRN

## 2019-10-19 MED ORDER — PHENYLEPHRINE 40 MCG/ML (10ML) SYRINGE FOR IV PUSH (FOR BLOOD PRESSURE SUPPORT)
PREFILLED_SYRINGE | INTRAVENOUS | Status: DC | PRN
  Administered 2019-10-19 (×3): 80 ug via INTRAVENOUS

## 2019-10-19 MED ORDER — POLYETHYLENE GLYCOL 3350 17 G PO PACK: 17.0000 g | PACK | Freq: Every day | ORAL | Status: DC | PRN

## 2019-10-19 SURGICAL SUPPLY — 15 items

## 2019-10-19 NOTE — Op Note (Signed)
[facility] Patient Name: Jacob [name] Procedure Date: 10/19/2019 MRN: [id number] Attending MD: Milus Banister , MD Date of Birth: 05-Sep-1933 CSN: [id number] Age: 86 Admit Type: Outpatient Procedure:                Upper GI endoscopy Indications:              Acute post hemorrhagic anemia, Melena Providers:                Milus Banister, MD, Cleda Daub, RN, Tyna Jaksch Technician Referring MD:              Medicines:                Monitored Anesthesia Care Complications:            No immediate complications. Estimated blood loss:                            None. Estimated Blood Loss:     Estimated blood loss: none. Procedure:                Pre-Anesthesia Assessment:                           - Prior to the procedure, a History and Physical                            was performed, and patient medications and                            allergies were reviewed. The patient's tolerance of                            previous anesthesia was also reviewed. The risks                            and benefits of the procedure and the sedation                            options and risks were discussed with the patient.                            All questions were answered, and informed consent                            was obtained. Prior Anticoagulants: The patient has                            taken Xarelto (rivaroxaban), last dose was 3 days                            prior to procedure. ASA Grade Assessment: III - A  patient with severe systemic disease. After                            reviewing the risks and benefits, the patient was                            deemed in satisfactory condition to undergo the                            procedure.                           After obtaining informed consent, the endoscope was                            passed under direct vision. Throughout the                             procedure, the patient's blood pressure, pulse, and                            oxygen saturations were monitored continuously. The                            GIF-H190 ([id number]) Olympus gastroscope was                            introduced through the mouth, and advanced to the                            afferent and efferent jejunal loops. The upper GI                            endoscopy was accomplished without difficulty. The                            patient tolerated the procedure well. Scope In: Scope Out: Findings:      Typical Bilroth II anatomy. There was active bleeding diffusely from the       gastric side of the BII anastomosis over aboute 180 degrees of the       circumference of the anastomosis. The mucosa was edematous, and there       were scattered atypical appearing AVMs at the bleeding sites. I treated       the actively bleeding mucosa was application of APC (stomach setting,       circumferential probe) to acheive hemostasis as well as to destroy any       underlying AVMs. This was quite successful however there were a few       scatter spots of breakthrough bleeding after APC and so I decided to       'paint' the entire region with hemospray. This achieved complete       hemostasis.      I biopsied the proximal gastric mucosa which was not bleeding but was       slightly erythematous (to check for H. pylori) and there was more  than       usual post biospy oozing from the site.      The exam was otherwise without abnormality. Brief looks into each limb       of the Bilroth II were normal. Impression:               - Typical post Bilroth II anatomy.                           - Actively bleeding, hemorrhagic gastritis (?                            diffuse AVMs) was treated with APC and then                            hemospray application. Unlcear etiology.                           - Proximal stomach mucosa was biopsied to check for                             H. pylori. Moderate Sedation:      Not Applicable - Patient had care per Anesthesia. Recommendation:           - Return patient to hospital ward for ongoing care.                           - Clear liquids.                           - IV PPI BID for now.                           - Observe for signs of recurrent bleeding.                           - Would not restart xarelto for 3-4 days. Procedure Code(s):        --- Professional ---                           [phone], Esophagogastroduodenoscopy, flexible,                            transoral; with biopsy, single or multiple Diagnosis Code(s):        --- Professional ---                           K29.70, Gastritis, unspecified, without bleeding                           D62, Acute posthemorrhagic anemia                           K92.1, Melena (includes Hematochezia) CPT copyright 2019 American Medical Association. All rights reserved. The codes documented in this report are preliminary and upon coder review may  be revised to meet current compliance requirements. Milus Banister, MD  10/19/2019 1:13:55 PM This report has been signed electronically. Number of Addenda: 0

## 2019-10-19 NOTE — Interval H&P Note (Signed)
History and Physical Interval Note:  10/19/2019 12:14 PM  Jacob [name]  has presented today for surgery, with the diagnosis of Melena.  The various methods of treatment have been discussed with the patient and family. After consideration of risks, benefits and other options for treatment, the patient has consented to  Procedure(s): ESOPHAGOGASTRODUODENOSCOPY (EGD) WITH PROPOFOL (N/A) as a surgical intervention.  The patient's history has been reviewed, patient examined, no change in status, stable for surgery.  I have reviewed the patient's chart and labs.  Questions were answered to the patient's satisfaction.     Milus Banister

## 2019-10-19 NOTE — Progress Notes (Signed)
PROGRESS NOTE    Jacob [name]  IOX:[id number] DOB: [birth year] DOA: 10/18/2019 PCP: [name], MD   Brief Narrative:  86 y.o. male with a past medical history of gout, BPH, complete heart block status post pacemaker, history of atrial fibrillation on Xarelto who was in his usual state of health till about 5 days ago when he started noticing black stool.     Assessment & Plan:   Principal Problem:   Melena Active Problems:   Gout, unspecified   BPH (benign prostatic hyperplasia)   Pacemaker   Atrial fibrillation (HCC)  Melanotic Stools Acute Blood Loss Anemia, microcytic History of diverticulosis/adenomatous polyps -Continue to hold Xarelto.  Closely monitor CBC -PPI IV twice daily -GI consulted, plans for endoscopic evaluation -We will obtain updated anemia profile -1 unit PRBC transfusion  History of Complete Heart Block s/p Pacemaker  -Seen by Dr. Lovena Le in the past.  Continue to closely monitor.  Persistent A fib on Pacemaker, On Xarelto  -Xarelto on hold due to GI bleed  Gout  -On allopurinol at home  BPH -Continue Flomax  History of thalassemia/iron deficiency anemia -On chronic Integra.  Followed Dr. Earlie Server in the past  Obstructive sleep apnea on CPAP    DVT prophylaxis: SCDs Code Status: Full  Family Communication: Daughter at bedside  Status is: Inpatient  Remains inpatient appropriate because:Hemodynamically unstable   Dispo: The patient is from: Home              Anticipated d/c is to: Home              Anticipated d/c date is: 2 days              Patient currently is not medically stable to d/c. Due to acute blood loss anemia undergoing endoscopic evaluation, maintain hospital stay.  Body mass index is 39.36 kg/m.      Subjective: Patient does not have any acute complaints when I saw him at bedside.  He is okay with PRBC transfusion.  Review of Systems Otherwise negative except as per HPI, including: General: Denies  fever, chills, night sweats or unintended weight loss. Resp: Denies cough, wheezing, shortness of breath. Cardiac: Denies chest pain, palpitations, orthopnea, paroxysmal nocturnal dyspnea. GI: Denies abdominal pain, nausea, vomiting, diarrhea or constipation GU: Denies dysuria, frequency, hesitancy or incontinence MS: Denies muscle aches, joint pain or swelling Neuro: Denies headache, neurologic deficits (focal weakness, numbness, tingling), abnormal gait Psych: Denies anxiety, depression, SI/HI/AVH Skin: Denies new rashes or lesions ID: Denies sick contacts, exotic exposures, travel  Examination:  General exam: Appears calm and comfortable  Respiratory system: Clear to auscultation. Respiratory effort normal. Cardiovascular system: S1 & S2 heard, RRR. No JVD, murmurs, rubs, gallops or clicks. No pedal edema. Gastrointestinal system: Abdomen is nondistended, soft and nontender. No organomegaly or masses felt. Normal bowel sounds heard. Central nervous system: Alert and oriented. No focal neurological deficits. Extremities: Symmetric 5 x 5 power. Skin: No rashes, lesions or ulcers Psychiatry: Judgement and insight appear normal. Mood & affect appropriate.     Objective: Vitals:   10/18/19 1818 10/18/19 2218 10/19/19 0219 10/19/19 0521  BP: (!) 127/56 (!) 129/54 (!) 111/51 (!) 119/51  Pulse: 61 65 60 64  Resp: 18 18 18 18   Temp: 98.4 F (36.9 C) 98 F (36.7 C) 98 F (36.7 C) 97.8 F (36.6 C)  TempSrc: Oral Oral    SpO2: 100% 99% 96% 98%  Weight:      Height:  Intake/Output Summary (Last 24 hours) at 10/19/2019 0737 Last data filed at 10/19/2019 0300 Gross per 24 hour  Intake 714.79 ml  Output --  Net 714.79 ml   Filed Weights   10/18/19 1756  Weight: 114 kg     Data Reviewed:   CBC: Recent Labs  Lab 10/18/19 1439 10/19/19 0319  WBC 6.6 5.9  HGB 8.8* 7.6*  HCT 27.9* 25.2*  MCV 63.1* 64.3*  PLT 155 063*   Basic Metabolic Panel: Recent Labs  Lab  10/18/19 1439 10/19/19 0319  NA 140 138  K 4.5 4.6  CL 105 107  CO2 26 25  GLUCOSE 112* 113*  BUN 28* 32*  CREATININE 0.97 1.03  CALCIUM 9.1 8.9   GFR: Estimated Creatinine Clearance: 64.4 mL/min (by C-G formula based on SCr of 1.03 mg/dL). Liver Function Tests: Recent Labs  Lab 10/18/19 1439 10/19/19 0319  AST 19 17  ALT 13 14  ALKPHOS 67 58  BILITOT 1.6* 1.6*  PROT 6.6 5.7*  ALBUMIN 3.9 3.5   No results for input(s): LIPASE, AMYLASE in the last 168 hours. No results for input(s): AMMONIA in the last 168 hours. Coagulation Profile: Recent Labs  Lab 10/18/19 1439 10/19/19 0319  INR 1.3* 1.1   Cardiac Enzymes: No results for input(s): CKTOTAL, CKMB, CKMBINDEX, TROPONINI in the last 168 hours. BNP (last 3 results) No results for input(s): PROBNP in the last 8760 hours. HbA1C: No results for input(s): HGBA1C in the last 72 hours. CBG: No results for input(s): GLUCAP in the last 168 hours. Lipid Profile: No results for input(s): CHOL, HDL, LDLCALC, TRIG, CHOLHDL, LDLDIRECT in the last 72 hours. Thyroid Function Tests: No results for input(s): TSH, T4TOTAL, FREET4, T3FREE, THYROIDAB in the last 72 hours. Anemia Panel: No results for input(s): VITAMINB12, FOLATE, FERRITIN, TIBC, IRON, RETICCTPCT in the last 72 hours. Sepsis Labs: No results for input(s): PROCALCITON, LATICACIDVEN in the last 168 hours.  Recent Results (from the past 240 hour(s))  SARS Coronavirus 2 by RT PCR (hospital order, performed in [facility] hospital lab) Nasopharyngeal Nasopharyngeal Swab     Status: None   Collection Time: 10/18/19  5:04 PM   Specimen: Nasopharyngeal Swab  Result Value Ref Range Status   SARS Coronavirus 2 NEGATIVE NEGATIVE Final    Comment: (NOTE) SARS-CoV-2 target nucleic acids are NOT DETECTED.  The SARS-CoV-2 RNA is generally detectable in upper and lower respiratory specimens during the acute phase of infection. The lowest concentration of SARS-CoV-2 viral  copies this assay can detect is 250 copies / mL. A negative result does not preclude SARS-CoV-2 infection and should not be used as the sole basis for treatment or other patient management decisions.  A negative result may occur with improper specimen collection / handling, submission of specimen other than nasopharyngeal swab, presence of viral mutation(s) within the areas targeted by this assay, and inadequate number of viral copies (<250 copies / mL). A negative result must be combined with clinical observations, patient history, and epidemiological information.  Fact Sheet for Patients:   StrictlyIdeas.no  Fact Sheet for Healthcare Providers: BankingDealers.co.za  This test is not yet approved or  cleared by the [place] FDA and has been authorized for detection and/or diagnosis of SARS-CoV-2 by FDA under an Emergency Use Authorization (EUA).  This EUA will remain in effect (meaning this test can be used) for the duration of the COVID-19 declaration under Section 564(b)(1) of the Act, 21 U.S.C. section 360bbb-3(b)(1), unless the authorization is terminated or revoked  sooner.  Performed at [facility], [place] [address] [place], [place] [address]          Radiology Studies: No results found.      Scheduled Meds: . pantoprazole (PROTONIX) IV  40 mg Intravenous Q12H  . tamsulosin  0.4 mg Oral QPC supper   Continuous Infusions: . sodium chloride 50 mL/hr at 10/18/19 1729  . sodium chloride       LOS: 1 day   Time spent= 35 mins    Key Cen Arsenio Loader, MD [facility]  If 7PM-7AM, please contact night-coverage  10/19/2019, 7:37 AM

## 2019-10-19 NOTE — Anesthesia Postprocedure Evaluation (Signed)
Anesthesia Post Note  Patient: Kainan Patty Domine  Procedure(s) Performed: ESOPHAGOGASTRODUODENOSCOPY (EGD) WITH PROPOFOL (N/A ) HOT HEMOSTASIS (ARGON PLASMA COAGULATION/BICAP) (N/A ) BIOPSY HEMOSTASIS CONTROL     Patient location during evaluation: PACU Anesthesia Type: MAC Level of consciousness: awake and alert Pain management: pain level controlled Vital Signs Assessment: post-procedure vital signs reviewed and stable Respiratory status: spontaneous breathing, nonlabored ventilation and respiratory function stable Cardiovascular status: blood pressure returned to baseline and stable Postop Assessment: no apparent nausea or vomiting Anesthetic complications: no   No complications documented.  Last Vitals:  Vitals:   10/19/19 1345 10/19/19 1350  BP: (!) 120/38 111/70  Pulse: 64 60  Resp: 15 18  Temp: 36.5 C   SpO2: 97% 98%    Last Pain:  Vitals:   10/19/19 1350  TempSrc:   PainSc: 0-No pain                 Pervis Hocking

## 2019-10-19 NOTE — Anesthesia Preprocedure Evaluation (Addendum)
Anesthesia Evaluation  Patient identified by MRN, date of birth, ID band Patient awake    Reviewed: Allergy & Precautions, NPO status , Patient's Chart, lab work & pertinent test results  Airway Mallampati: II  TM Distance: >3 FB Neck ROM: Full    Dental no notable dental hx. (+) Partial Lower, Dental Advisory Given, Teeth Intact,    Pulmonary sleep apnea and Continuous Positive Airway Pressure Ventilation , former smoker,  60 pack year history    Pulmonary exam normal breath sounds clear to auscultation       Cardiovascular hypertension, Pt. on medications +CHF (LVEF 50%)  Normal cardiovascular exam+ dysrhythmias (on xarelto ) Atrial Fibrillation + pacemaker (hx CHB) + Valvular Problems/Murmurs (mild AS) AS  Rhythm:Regular Rate:Normal  Last echo 2018: - Left ventricle: LVEF is approximately 50% with apical  hypokinesis. The cavity size was normal. Wall thickness was  increased in a pattern of moderate LVH.  - Aortic valve: AV is thickened, calcified with peak and mean  gradients through the valve are 22 and 11 mm Hg respectively  consistent with mild AS.  - Left atrium: The atrium was severely dilated.  - Right atrium: The atrium was mildly dilated.  - Pulmonary arteries: PA peak pressure: 33 mm Hg (S).    Neuro/Psych negative neurological ROS  negative psych ROS   GI/Hepatic Neg liver ROS, PUD, GERD  Medicated and Controlled,Melena, GIB, ABLA on xarelto S/p partial gastrectomy 1956   Endo/Other  Morbid obesityBMI 40  Renal/GU Renal disease  negative genitourinary   Musculoskeletal  (+) Arthritis ,   Abdominal (+) + obese,   Peds  Hematology  (+) Blood dyscrasia, anemia , H/H 7.6/25.2, plt 143   Anesthesia Other Findings   Reproductive/Obstetrics negative OB ROS                            Anesthesia Physical Anesthesia Plan  ASA: III  Anesthesia Plan: MAC   Post-op Pain  Management:    Induction:   PONV Risk Score and Plan: 2 and Propofol infusion and TIVA  Airway Management Planned: Natural Airway and Simple Face Mask  Additional Equipment: None  Intra-op Plan:   Post-operative Plan:   Informed Consent: I have reviewed the patients History and Physical, chart, labs and discussed the procedure including the risks, benefits and alternatives for the proposed anesthesia with the patient or authorized representative who has indicated his/her understanding and acceptance.     Dental advisory given  Plan Discussed with: CRNA  Anesthesia Plan Comments:        Anesthesia Quick Evaluation

## 2019-10-19 NOTE — Transfer of Care (Signed)
Immediate Anesthesia Transfer of Care Note  Patient: REEF ACHTERBERG  Procedure(s) Performed: ESOPHAGOGASTRODUODENOSCOPY (EGD) WITH PROPOFOL (N/A ) HOT HEMOSTASIS (ARGON PLASMA COAGULATION/BICAP) (N/A ) BIOPSY HEMOSTASIS CONTROL  Patient Location: PACU and Endoscopy Unit  Anesthesia Type:MAC  Level of Consciousness: awake, alert , oriented and patient cooperative  Airway & Oxygen Therapy: Patient Spontanous Breathing and Patient connected to face mask oxygen  Post-op Assessment: Report given to RN, Post -op Vital signs reviewed and stable and Patient moving all extremities  Post vital signs: Reviewed and stable  Last Vitals:  Vitals Value Taken Time  BP    Temp    Pulse    Resp    SpO2      Last Pain:  Vitals:   10/19/19 1156  TempSrc: Oral  PainSc: 0-No pain         Complications: No complications documented.

## 2019-10-20 ENCOUNTER — Telehealth: Payer: Self-pay | Admitting: *Deleted

## 2019-10-20 ENCOUNTER — Other Ambulatory Visit: Payer: Self-pay

## 2019-10-20 ENCOUNTER — Encounter (HOSPITAL_COMMUNITY): Payer: Self-pay | Admitting: Gastroenterology

## 2019-10-20 ENCOUNTER — Telehealth: Payer: Self-pay

## 2019-10-20 DIAGNOSIS — K921 Melena: Secondary | ICD-10-CM

## 2019-10-20 LAB — CBC
HCT: 30.6 % — ABNORMAL LOW (ref 39.0–52.0)
Hemoglobin: 9.5 g/dL — ABNORMAL LOW (ref 13.0–17.0)
MCH: 20.8 pg — ABNORMAL LOW (ref 26.0–34.0)
MCHC: 31 g/dL (ref 30.0–36.0)
MCV: 67.1 fL — ABNORMAL LOW (ref 80.0–100.0)
Platelets: 159 10*3/uL (ref 150–400)
RBC: 4.56 MIL/uL (ref 4.22–5.81)
RDW: 23.5 % — ABNORMAL HIGH (ref 11.5–15.5)
WBC: 7.3 10*3/uL (ref 4.0–10.5)
nRBC: 0.7 % — ABNORMAL HIGH (ref 0.0–0.2)

## 2019-10-20 LAB — TYPE AND SCREEN
ABO/RH(D): A POS
Antibody Screen: NEGATIVE
Unit division: 0
Unit division: 0

## 2019-10-20 LAB — BPAM RBC
Blood Product Expiration Date: 202108232359
Blood Product Expiration Date: 202108232359
ISSUE DATE / TIME: 202108051156
ISSUE DATE / TIME: 202108051239
Unit Type and Rh: 6200
Unit Type and Rh: 6200

## 2019-10-20 LAB — BASIC METABOLIC PANEL
Anion gap: 10 (ref 5–15)
BUN: 20 mg/dL (ref 8–23)
CO2: 22 mmol/L (ref 22–32)
Calcium: 9.1 mg/dL (ref 8.9–10.3)
Chloride: 108 mmol/L (ref 98–111)
Creatinine, Ser: 0.79 mg/dL (ref 0.61–1.24)
GFR calc Af Amer: >60 mL/min (ref >60)
GFR calc non Af Amer: >60 mL/min (ref >60)
Glucose, Bld: 107 mg/dL — ABNORMAL HIGH (ref 70–99)
Potassium: 4.4 mmol/L (ref 3.5–5.1)
Sodium: 140 mmol/L (ref 135–145)

## 2019-10-20 LAB — FOLATE RBC
Folate, Hemolysate: 478 ng/mL
Folate, RBC: 2096 ng/mL (ref >498)
Hematocrit: 22.8 % — ABNORMAL LOW (ref 37.5–51.0)

## 2019-10-20 LAB — MAGNESIUM: Magnesium: 2.1 mg/dL (ref 1.7–2.4)

## 2019-10-20 LAB — SURGICAL PATHOLOGY

## 2019-10-20 MED ORDER — PANTOPRAZOLE SODIUM 40 MG PO TBEC: 40.0000 mg | DELAYED_RELEASE_TABLET | Freq: Two times a day (BID) | ORAL | 1 refills | Status: DC

## 2019-10-20 MED ORDER — SODIUM CHLORIDE 0.9 % IV SOLN
510.0000 mg | Freq: Once | INTRAVENOUS | Status: AC
  Administered 2019-10-20: 510 mg via INTRAVENOUS
  Filled 2019-10-20: qty 510

## 2019-10-20 NOTE — Telephone Encounter (Signed)
-----   Message from Milus Banister, MD sent at 10/20/2019  9:02 AM EDT ----- He is leaving hospital today.  Needs labs mid next week, CBC.  Please send results to Dr. [name] as I am out of town. Needs OV with Dr. [name] or extender in 5-6 weeks.  Remind him to be on PPI BID for now.  Thanks  Jenny Reichmann, Juluis Rainier

## 2019-10-20 NOTE — Discharge Summary (Signed)
Physician Discharge Summary  RAAHIL ONG FYB:[id number] DOB: 06/08/1933 DOA: 10/18/2019  PCP: [name], MD  Admit date: 10/18/2019 Discharge date: 10/20/2019  Admitted From: Home Disposition: Home  Recommendations for Outpatient Follow-up:  1. Follow up with PCP in 1-2 weeks 2. Please obtain BMP/CBC in one week your next doctors visit.  3. Protonix twice daily at least 30 minutes before meals 4. Advised to resume Xarelto on 10/23/2019 if no further evidence of bleeding 5. Advised to avoid caffeine or other substances that irritates gastritis such as NSAIDs, chocolate, spicy food 6. Follow-up with GI in 4-6 weeks outpatient.  May need repeat endoscopy 7. H. pylori results will be followed up by GI team and patient will be notified  Discharge Condition: Stable CODE STATUS: Full Diet recommendation: Heart healthy  Brief/Interim Summary: 86 y.o.malewith a past medical history of gout, BPH, complete heart block status post pacemaker, history of atrial fibrillation on Xarelto who was in his usual state of health till about 5 days ago when he started noticing black stool.  Endoscopy 8/5 showed active bleeding/hemorrhagic gastritis treated with APC, samples for H. pylori were taken.    Assessment & Plan:   Principal Problem:   Melena Active Problems:   Gout, unspecified   BPH (benign prostatic hyperplasia)   Pacemaker   Atrial fibrillation (HCC)  Melanotic Stools secondary to hemorrhagic gastritis Acute Blood Loss Anemia, microcytic.  Iron deficiency History of diverticulosis/adenomatous polyps -Status post EGD 8/5-Billroth II anatomy, active bleeding with hemorrhagic gastritis treated with APC, H. pylori checked -PPI twice daily upon discharge.  Bleeding has subsided for now -Continue to hold Xarelto until 10/23/2019.  Resume if no further evidence of bleeding. -Status post 1 unit PRBC on 8/5 -Cleared by GI to be discharged -1 dose IV iron ordered but patient wants  to confirm with Dr. Earlie Server before he gets it.  We will hold off on p.o. iron for now until gastritis has started resolving.  History of Complete Heart Block s/p Pacemaker  -Seen by Dr. Lovena Le in the past.  Continue to closely monitor.  Persistent A fib on Pacemaker, On Xarelto  -Xarelto on hold due to GI bleed at least until 10/23/2019  Gout  -On allopurinol at home  BPH -Continue Flomax  History of thalassemia/iron deficiency anemia -On chronic Integra.  Followed Dr. Earlie Server in the past  Obstructive sleep apnea on CPAP  Body mass index is 39.36 kg/m.     Discharge Diagnoses:  Principal Problem:   Melena Active Problems:   Gout, unspecified   BPH (benign prostatic hyperplasia)   Pacemaker   Atrial fibrillation [facility])      Consultations:  GI  Subjective: Patient seen at bedside, daughter at the bedside as well.  Patient has been walking around the hallway without any symptoms.  No further evidence of melena or bleeding.  Feels great back to himself.  Discharge Exam: Vitals:   10/19/19 2132 10/20/19 0432  BP: (!) 136/55 (!) 129/55  Pulse: 66 61  Resp: 18 19  Temp: 98.3 F (36.8 C) 98.6 F (37 C)  SpO2: 99% 98%   Vitals:   10/19/19 1436 10/19/19 1557 10/19/19 2132 10/20/19 0432  BP: 129/63 125/62 (!) 136/55 (!) 129/55  Pulse: 60 61 66 61  Resp: 16 16 18 19   Temp: (!) 97.5 F (36.4 C) 97.8 F (36.6 C) 98.3 F (36.8 C) 98.6 F (37 C)  TempSrc: Oral Oral    SpO2: 98% 99% 99% 98%  Weight:  Height:        General: Pt is alert, awake, not in acute distress Cardiovascular: RRR, S1/S2 +, no rubs, no gallops Respiratory: CTA bilaterally, no wheezing, no rhonchi Abdominal: Soft, NT, ND, bowel sounds + Extremities: no edema, no cyanosis  Discharge Instructions   Allergies as of 10/20/2019      Reactions   Prednisone    REACTION: GI bleed   Sulfa Antibiotics Rash, Other (See Comments)   "GI impact"      Medication List    TAKE these  medications   allopurinol 300 MG tablet Commonly known as: ZYLOPRIM Take 1 tablet (300 mg total) by mouth daily.   b complex vitamins capsule Take 1 capsule by mouth daily.   Coenzyme Q10 300 MG Caps Take 300 mg by mouth daily.   fexofenadine 180 MG tablet Commonly known as: ALLEGRA Take 180 mg by mouth daily.   Fish Oil 600 MG Caps Take 600 mg by mouth daily.   furosemide 20 MG tablet Commonly known as: LASIX Take 1 tablet (20 mg total) by mouth daily.   Integra Plus Caps TAKE 1 CAPSULE BY MOUTH EVERY MORNING   pantoprazole 40 MG tablet Commonly known as: Protonix Take 1 tablet (40 mg total) by mouth 2 (two) times daily before a meal.   rivaroxaban 20 MG Tabs tablet Commonly known as: Xarelto TAKE 1 TABLET(20 MG) BY MOUTH DAILY WITH SUPPER What changed:   how much to take  how to take this  when to take this   tamsulosin 0.4 MG Caps capsule Commonly known as: FLOMAX Take 0.4 mg by mouth daily.   VITAMIN B 12 PO Take 1,000 mg by mouth daily.   VITAMIN C PO Take 1 capsule by mouth every other day.   Vitamin D3 50 MCG (2000 UT) capsule Take 1 capsule (2,000 Units total) by mouth daily.       Follow-up Information    [name], [name], MD. Schedule an appointment as soon as possible for a visit in 1 week(s).   Specialty: Internal Medicine Contact information: [place] [address] [phone]        [name], MD .   Specialty: Cardiology Contact information: [phone] [address] [place] [address] [phone]        [name], MD .   Specialty: Cardiology Contact information: [address] [place] [address] [phone]              Allergies  Allergen Reactions  . Prednisone     REACTION: GI bleed  . Sulfa Antibiotics Rash and Other (See Comments)    "GI impact"    You were cared for by a hospitalist during your hospital stay. If you have any questions about  your discharge medications or the care you received while you were in the hospital after you are discharged, you can call the unit and asked to speak with the hospitalist on call if the hospitalist that took care of you is not available. Once you are discharged, your primary care physician will handle any further medical issues. Please note that no refills for any discharge medications will be authorized once you are discharged, as it is imperative that you return to your primary care physician (or establish a relationship with a primary care physician if you do not have one) for your aftercare needs so that they can reassess your need for medications and monitor your lab values.   Procedures/Studies:  No results found.   The results of significant diagnostics from this hospitalization (including imaging, microbiology, ancillary and laboratory) are listed below for reference.     Microbiology: Recent Results (from the past 240 hour(s))  SARS Coronavirus 2 by RT PCR (hospital order, performed in [facility] hospital lab) Nasopharyngeal Nasopharyngeal Swab     Status: None   Collection Time: 10/18/19  5:04 PM   Specimen: Nasopharyngeal Swab  Result Value Ref Range Status   SARS Coronavirus 2 NEGATIVE NEGATIVE Final    Comment: (NOTE) SARS-CoV-2 target nucleic acids are NOT DETECTED.  The SARS-CoV-2 RNA is generally detectable in upper and lower respiratory specimens during the acute phase of infection. The lowest concentration of SARS-CoV-2 viral copies this assay can detect is 250 copies / mL. A negative result does not preclude SARS-CoV-2 infection and should not be used as the sole basis for treatment or other patient management decisions.  A negative result may occur with improper specimen collection / handling, submission of specimen other than nasopharyngeal swab, presence of viral mutation(s) within the areas targeted by this assay, and inadequate number of viral copies (<250  copies / mL). A negative result must be combined with clinical observations, patient history, and epidemiological information.  Fact Sheet for Patients:   StrictlyIdeas.no  Fact Sheet for Healthcare Providers: BankingDealers.co.za  This test is not yet approved or  cleared by the [place] FDA and has been authorized for detection and/or diagnosis of SARS-CoV-2 by FDA under an Emergency Use Authorization (EUA).  This EUA will remain in effect (meaning this test can be used) for the duration of the COVID-19 declaration under Section 564(b)(1) of the Act, 21 U.S.C. section 360bbb-3(b)(1), unless the authorization is terminated or revoked sooner.  Performed at [facility], [place] [address], [place], [place] [address]      Labs: BNP (last 3 results) No results for input(s): BNP in the last 8760 hours. Basic Metabolic Panel: Recent Labs  Lab 10/18/19 1439 10/19/19 0319 10/20/19 0336  NA 140 138 140  K 4.5 4.6 4.4  CL 105 107 108  CO2 26 25 22   GLUCOSE 112* 113* 107*  BUN 28* 32* 20  CREATININE 0.97 1.03 0.79  CALCIUM 9.1 8.9 9.1  MG  --   --  2.1   Liver Function Tests: Recent Labs  Lab 10/18/19 1439 10/19/19 0319  AST 19 17  ALT 13 14  ALKPHOS 67 58  BILITOT 1.6* 1.6*  PROT 6.6 5.7*  ALBUMIN 3.9 3.5   No results for input(s): LIPASE, AMYLASE in the last 168 hours. No results for input(s): AMMONIA in the last 168 hours. CBC: Recent Labs  Lab 10/18/19 1439 10/19/19 0319 10/19/19 1859 10/20/19 0336  WBC 6.6 5.9  --  7.3  HGB 8.8* 7.6* 8.9* 9.5*  HCT 27.9* 25.2* 28.5* 30.6*  MCV 63.1* 64.3*  --  67.1*  PLT 155 143*  --  159   Cardiac Enzymes: No results for input(s): CKTOTAL, CKMB, CKMBINDEX, TROPONINI in the last 168 hours. BNP: Invalid input(s): POCBNP CBG: No results for input(s): GLUCAP in the last 168 hours. D-Dimer No results for input(s): DDIMER in the last 72 hours. Hgb  A1c No results for input(s): HGBA1C in the last 72 hours. Lipid Profile No results for input(s): CHOL, HDL, LDLCALC, TRIG, CHOLHDL, LDLDIRECT in the last 72 hours. Thyroid function studies No results for input(s): TSH, T4TOTAL, T3FREE, THYROIDAB in the last 72 hours.  Invalid input(s): FREET3 Anemia work up  Recent Labs    10/19/19 0801  VITAMINB12 1,863*  FERRITIN 12*  TIBC 394  IRON 37*   Urinalysis    Component Value Date/Time   COLORURINE YELLOW 07/31/2013 [place] 07/31/2013 0634   LABSPEC 1.015 07/31/2013 0634   PHURINE 6.5 07/31/2013 [place] 07/31/2013 0634   HGBUR NEGATIVE 07/31/2013 0634   BILIRUBINUR NEGATIVE 07/31/2013 [place] 07/31/2013 0634   PROTEINUR NEGATIVE 07/31/2013 0634   UROBILINOGEN 0.2 07/31/2013 0634   NITRITE NEGATIVE 07/31/2013 0634   LEUKOCYTESUR NEGATIVE 07/31/2013 0634   Sepsis Labs Invalid input(s): PROCALCITONIN,  WBC,  LACTICIDVEN Microbiology Recent Results (from the past 240 hour(s))  SARS Coronavirus 2 by RT PCR (hospital order, performed in [facility] hospital lab) Nasopharyngeal Nasopharyngeal Swab     Status: None   Collection Time: 10/18/19  5:04 PM   Specimen: Nasopharyngeal Swab  Result Value Ref Range Status   SARS Coronavirus 2 NEGATIVE NEGATIVE Final    Comment: (NOTE) SARS-CoV-2 target nucleic acids are NOT DETECTED.  The SARS-CoV-2 RNA is generally detectable in upper and lower respiratory specimens during the acute phase of infection. The lowest concentration of SARS-CoV-2 viral copies this assay can detect is 250 copies / mL. A negative result does not preclude SARS-CoV-2 infection and should not be used as the sole basis for treatment or other patient management decisions.  A negative result may occur with improper specimen collection / handling, submission of specimen other than nasopharyngeal swab, presence of viral mutation(s) within the areas targeted by this  assay, and inadequate number of viral copies (<250 copies / mL). A negative result must be combined with clinical observations, patient history, and epidemiological information.  Fact Sheet for Patients:   StrictlyIdeas.no  Fact Sheet for Healthcare Providers: BankingDealers.co.za  This test is not yet approved or  cleared by the [place] FDA and has been authorized for detection and/or diagnosis of SARS-CoV-2 by FDA under an Emergency Use Authorization (EUA).  This EUA will remain in effect (meaning this test can be used) for the duration of the COVID-19 declaration under Section 564(b)(1) of the Act, 21 U.S.C. section 360bbb-3(b)(1), unless the authorization is terminated or revoked sooner.  Performed at [facility], [place] [address] [place], [place] [address]      Time coordinating discharge:  I have spent 35 minutes face to face with the patient and on the ward discussing the patients care, assessment, plan and disposition with other care givers. >50% of the time was devoted counseling the patient about the risks and benefits of treatment/Discharge disposition and coordinating care.   SIGNED:   Damita Lack, MD  [facility] 10/20/2019, 9:39 AM   If 7PM-7AM, please contact night-coverage

## 2019-10-20 NOTE — Telephone Encounter (Signed)
Leaving hospital today and physician is recommending IV iron infusion. He states he is unsure this is necessary and wishes to discuss w/Dr. Julien Nordmann. Message forwarded to MD>

## 2019-10-20 NOTE — Progress Notes (Addendum)
[facility] Progress Note    Since last GI note: EGD yesterday, hemorrhagic gastritis vs. Diffuse subtle AVM bleeding. Biopsies taken for H. Pylori  He's had no melena (or BM) in 4 days.  He feels great, took 25 laps around the halls this AM  Objective: Vital signs in last 24 hours: Temp:  [97.5 F (36.4 C)-98.6 F (37 C)] 98.6 F (37 C) (08/06 0432) Pulse Rate:  [59-66] 61 (08/06 0432) Resp:  [15-20] 19 (08/06 0432) BP: (87-164)/(29-70) 129/55 (08/06 0432) SpO2:  [95 %-100 %] 98 % (08/06 0432) Weight:  [517 kg] 114 kg (08/05 1156) Last BM Date: 10/16/19 General: alert and oriented times 3 Heart: regular rate and rythm Abdomen: soft, non-tender, non-distended, normal bowel sounds   Lab Results: Recent Labs    10/18/19 1439 10/18/19 1439 10/19/19 0319 10/19/19 1859 10/20/19 0336  WBC 6.6  --  5.9  --  7.3  HGB 8.8*   < > 7.6* 8.9* 9.5*  PLT 155  --  143*  --  159  MCV 63.1*  --  64.3*  --  67.1*   < > = values in this interval not displayed.   Recent Labs    10/18/19 1439 10/19/19 0319 10/20/19 0336  NA 140 138 140  K 4.5 4.6 4.4  CL 105 107 108  CO2 26 25 22   GLUCOSE 112* 113* 107*  BUN 28* 32* 20  CREATININE 0.97 1.03 0.79  CALCIUM 9.1 8.9 9.1   Recent Labs    10/18/19 1439 10/19/19 0319  PROT 6.6 5.7*  ALBUMIN 3.9 3.5  AST 19 17  ALT 13 14  ALKPHOS 67 58  BILITOT 1.6* 1.6*   Recent Labs    10/18/19 1439 10/19/19 0319  INR 1.3* 1.1    Medications: Scheduled Meds: . sodium chloride   Intravenous Once  . pantoprazole (PROTONIX) IV  40 mg Intravenous Q12H  . tamsulosin  0.4 mg Oral QPC supper   Continuous Infusions: . ferumoxytol     PRN Meds:.acetaminophen **OR** acetaminophen, ondansetron **OR** ondansetron (ZOFRAN) IV, polyethylene glycol, senna-docusate  Assessment/Plan: 86 y.o. male with hemorrhagic gastritis, unclear etiology.  May have been very subtle diffuse AVMs.  Treated with APC and then hemospray  application.  He does not drink etoh at all and takes zero NSAID.  Unclear why his stomach is so friable. Biopsies were taken to check for underlying H. Pylori.   He really looks great this morning. OK to d/c after solid lunch.  My office will arrange for labs next week, ROV with Dr. [name] in 5-6 weeks. He knows to not resume his blood thinner until Monday.   He should be on BID PPI for now.  Please call or page with any further questions or concerns.   Milus Banister, MD  10/20/2019, 8:42 AM [facility] Pager [phone]

## 2019-10-20 NOTE — Telephone Encounter (Signed)
Lab order entered and follow up made with Dr [name] on 12/18/19 at 2 pm. Pt will be advised at discharge

## 2019-10-20 NOTE — Progress Notes (Signed)
Pt discharged to home with daughter, discharge instructions and medication education provided to pt and daughter.

## 2019-10-23 ENCOUNTER — Telehealth: Payer: Self-pay | Admitting: Physician Assistant

## 2019-10-23 NOTE — Telephone Encounter (Signed)
See results note 8/9

## 2019-10-26 ENCOUNTER — Ambulatory Visit: Payer: Medicare Other | Admitting: Physician Assistant

## 2019-10-30 ENCOUNTER — Other Ambulatory Visit: Payer: Self-pay | Admitting: Internal Medicine

## 2019-10-30 DIAGNOSIS — D568 Other thalassemias: Secondary | ICD-10-CM

## 2019-11-01 ENCOUNTER — Telehealth: Payer: Self-pay | Admitting: Physician Assistant

## 2019-11-01 ENCOUNTER — Other Ambulatory Visit (INDEPENDENT_AMBULATORY_CARE_PROVIDER_SITE_OTHER): Payer: Medicare Other

## 2019-11-01 DIAGNOSIS — K921 Melena: Secondary | ICD-10-CM | POA: Diagnosis not present

## 2019-11-01 LAB — CBC WITH DIFFERENTIAL/PLATELET
Basophils Absolute: 0.1 10*3/uL (ref 0.0–0.1)
Basophils Relative: 1.2 % (ref 0.0–3.0)
Eosinophils Absolute: 0.1 10*3/uL (ref 0.0–0.7)
Eosinophils Relative: 2.6 % (ref 0.0–5.0)
HCT: 34.9 % — ABNORMAL LOW (ref 39.0–52.0)
Hemoglobin: 10.9 g/dL — ABNORMAL LOW (ref 13.0–17.0)
Lymphocytes Relative: 32.1 % (ref 12.0–46.0)
Lymphs Abs: 1.6 10*3/uL (ref 0.7–4.0)
MCHC: 31.2 g/dL (ref 30.0–36.0)
MCV: 66.9 fl — ABNORMAL LOW (ref 78.0–100.0)
Monocytes Absolute: 0.6 10*3/uL (ref 0.1–1.0)
Monocytes Relative: 12.2 % — ABNORMAL HIGH (ref 3.0–12.0)
Neutro Abs: 2.5 10*3/uL (ref 1.4–7.7)
Neutrophils Relative %: 51.9 % (ref 43.0–77.0)
Platelets: 206 10*3/uL (ref 150.0–400.0)
RBC: 5.21 Mil/uL (ref 4.22–5.81)
RDW: 23.1 % — ABNORMAL HIGH (ref 11.5–15.5)
WBC: 4.9 10*3/uL (ref 4.0–10.5)

## 2019-11-01 NOTE — Telephone Encounter (Signed)
Jacob [name] saw Dr Ardis Hughs last. I took the call. It looks like he is due (past due) follow up labs.  He is calling with c/o black stool yesterday and today. Denies any abdominal pain, nausea or feeling SOB or dizzy. He is on Integra and has been "forever." Sending this message to Dr Ardis Hughs and you. If you are not able to take care of him, let me know. I will help out.

## 2019-11-01 NOTE — Telephone Encounter (Signed)
I spoke with the pt and he tells me that his stools are now somewhat green and do not look to have any blood in them.  He does agree to come in for labs today.  That order has been entered.

## 2019-11-01 NOTE — Telephone Encounter (Signed)
Pt called stating that he noticed blood in his stool, episodes began yesterday. Pls call him.

## 2019-11-01 NOTE — Telephone Encounter (Signed)
Patty, Can you touch base with him. I don't think he ever came in for his follow up labs. He needs CBC now. Thanks

## 2019-11-30 ENCOUNTER — Other Ambulatory Visit: Payer: Self-pay | Admitting: Cardiology

## 2019-12-04 ENCOUNTER — Other Ambulatory Visit (HOSPITAL_COMMUNITY): Payer: Self-pay | Admitting: Urology

## 2019-12-04 DIAGNOSIS — C61 Malignant neoplasm of prostate: Secondary | ICD-10-CM

## 2019-12-12 ENCOUNTER — Ambulatory Visit (HOSPITAL_COMMUNITY)
Admission: RE | Admit: 2019-12-12 | Discharge: 2019-12-12 | Disposition: A | Discharge status: Home / Self Care | Payer: Medicare Other | Source: Ambulatory Visit | Attending: Urology | Admitting: Urology

## 2019-12-12 ENCOUNTER — Other Ambulatory Visit: Payer: Self-pay

## 2019-12-12 DIAGNOSIS — C61 Malignant neoplasm of prostate: Secondary | ICD-10-CM

## 2019-12-12 MED ORDER — AXUMIN (FLUCICLOVINE F 18) INJECTION
9.5000 | Freq: Once | INTRAVENOUS | Status: AC | PRN
  Administered 2019-12-12: 9.5 via INTRAVENOUS

## 2019-12-13 ENCOUNTER — Ambulatory Visit (INDEPENDENT_AMBULATORY_CARE_PROVIDER_SITE_OTHER): Payer: Medicare Other | Admitting: Emergency Medicine

## 2019-12-13 ENCOUNTER — Telehealth: Payer: Self-pay

## 2019-12-13 DIAGNOSIS — I440 Atrioventricular block, first degree: Secondary | ICD-10-CM | POA: Diagnosis not present

## 2019-12-13 NOTE — Telephone Encounter (Signed)
Patient called in today stating that he received the letter that he has missed his transmission and he will send today and I assured him that I will get him on a new schedule once he sends that in. Patient verbalized understanding

## 2019-12-13 NOTE — Telephone Encounter (Signed)
Patient is calling to follow up regarding sending in transmission today. He is requesting assistance with reinstalling device app on his phone. Attempted to contact device clinic - got fast busy dial tone. Please return call to assist.

## 2019-12-14 ENCOUNTER — Telehealth: Payer: Self-pay

## 2019-12-14 LAB — CUP PACEART REMOTE DEVICE CHECK
Battery Remaining Longevity: 28 mo
Battery Voltage: 2.95 V
Brady Statistic RA Percent Paced: 0.1 %
Brady Statistic RV Percent Paced: 97.85 %
Date Time Interrogation Session: 20210929102858
Implantable Lead Implant Date: 20160622
Implantable Lead Implant Date: 20160622
Implantable Lead Location: 753859
Implantable Lead Location: 753860
Implantable Lead Model: 5076
Implantable Lead Model: 5076
Implantable Pulse Generator Implant Date: 20160622
Lead Channel Impedance Value: 323 Ohm
Lead Channel Impedance Value: 361 Ohm
Lead Channel Impedance Value: 418 Ohm
Lead Channel Impedance Value: 456 Ohm
Lead Channel Pacing Threshold Amplitude: 0.625 V
Lead Channel Pacing Threshold Amplitude: 0.875 V
Lead Channel Pacing Threshold Pulse Width: 0.4 ms
Lead Channel Pacing Threshold Pulse Width: 0.4 ms
Lead Channel Sensing Intrinsic Amplitude: 10.125 mV
Lead Channel Sensing Intrinsic Amplitude: 10.125 mV
Lead Channel Sensing Intrinsic Amplitude: 2.625 mV
Lead Channel Sensing Intrinsic Amplitude: 2.625 mV
Lead Channel Setting Pacing Amplitude: 2 V
Lead Channel Setting Pacing Amplitude: 2.5 V
Lead Channel Setting Pacing Pulse Width: 0.4 ms
Lead Channel Setting Sensing Sensitivity: 4 mV

## 2019-12-15 ENCOUNTER — Ambulatory Visit (INDEPENDENT_AMBULATORY_CARE_PROVIDER_SITE_OTHER): Payer: Medicare Other | Admitting: Internal Medicine

## 2019-12-15 ENCOUNTER — Encounter: Payer: Self-pay | Admitting: Internal Medicine

## 2019-12-15 VITALS — BP 128/64 | HR 71 | Ht 67.0 in | Wt 242.0 lb

## 2019-12-15 DIAGNOSIS — K2901 Acute gastritis with bleeding: Secondary | ICD-10-CM | POA: Diagnosis not present

## 2019-12-15 DIAGNOSIS — D62 Acute posthemorrhagic anemia: Secondary | ICD-10-CM | POA: Diagnosis not present

## 2019-12-15 DIAGNOSIS — K922 Gastrointestinal hemorrhage, unspecified: Secondary | ICD-10-CM

## 2019-12-15 NOTE — Patient Instructions (Signed)
Please follow up as needed 

## 2019-12-15 NOTE — Progress Notes (Signed)
Remote pacemaker transmission.   

## 2019-12-15 NOTE — Progress Notes (Signed)
HISTORY OF PRESENT ILLNESS:  Jacob [name] is a 86 y.o. male with multiple medical problems including history of atrial fibrillation for which she is on chronic Xarelto therapy.  He also has a history of complete heart block status post pacemaker placement.  He also has hypertension, obesity, sleep apnea.  There is a history of chronic microcytosis secondary to thalassemia.  Remote complicated GI bleeding secondary to peptic ulcer disease in the 1950s for which she underwent Billroth II gastrojejunostomy.  I have seen him on multiple occasions over the years for various GI needs for surveillance colonoscopies.  He was last seen by the GI physician assistant October 18, 2019 regarding dark stools.  He was subsequently hospitalized.  His baseline hemoglobin is around 11.5.  During his hospitalization his hemoglobin dropped to 7.6.  Upper endoscopy was performed by Dr. Ardis Hughs on October 19, 2019.  In addition to Billroth II anatomy, the patient was found to have active bleeding from the gastric side of the Billroth II anastomosis this was superficial vascular in nature, there are no classic lesion noted.  This was treated with APC therapy followed by hemospray.  Patient did well after with resolution of dark stools.  Gradual improvement in hemoglobin over time.  Most recent hemoglobin is PCP was 12.  He has been back on Xarelto for the past month.  No active GI complaints.  He is accompanied by his wife.  They have questions regarding dietary measures.  His last colonoscopy in 2015 revealed diverticulosis only.  REVIEW OF SYSTEMS:  All non-GI ROS negative except for insomnia, arthritis  Past Medical History:  Diagnosis Date   Anemia    Arthritis    "middle finger both hands" (09/05/2014)   Basal cell carcinoma    Benign prostatic hypertrophy    hx of   Colon polyp 2006, 2010   TUBULAR ADENOMA   Diverticulosis of colon (without mention of hemorrhage)    First degree atrioventricular block     GERD (gastroesophageal reflux disease)    Gout    History of blood transfusion 12/1953; 03/1954   Transfused 7 pints blood '55, 4 pints '56   History of duodenal ulcer 1955   w/hemorrhage   Kidney stones    "passed them"   Morbid obesity ([place])    "I was 294; lost 100# 2014-2015"   MVP (mitral valve prolapse)    OSA (obstructive sleep apnea)    "stopped wearing mask after I lost 100#"   Osteoarthritis    Other thalassemia ([place])    Pneumonia "a number of times"   Presence of permanent cardiac pacemaker    Prostate cancer [facility])     Past Surgical History:  Procedure Laterality Date   BIOPSY  10/19/2019   Procedure: BIOPSY;  Surgeon: Milus Banister, MD;  Location: [facility];  Service: Endoscopy;;   CATARACT EXTRACTION W/ INTRAOCULAR LENS  IMPLANT, BILATERAL  2002-2004   CLOSED REDUCTION ANKLE DISLOCATION Right 1980's   "toe my achilles"   COLONOSCOPY  2015   multiple    EP IMPLANTABLE DEVICE N/A 09/05/2014   Procedure: Pacemaker Implant;  Surgeon: [name], MD;  Location: [facility];  Service: Cardiovascular;  Laterality: N/A;   ESOPHAGOGASTRODUODENOSCOPY  2011   multiple   ESOPHAGOGASTRODUODENOSCOPY (EGD) WITH PROPOFOL N/A 10/19/2019   Procedure: ESOPHAGOGASTRODUODENOSCOPY (EGD) WITH PROPOFOL;  Surgeon: Milus Banister, MD;  Location: [facility];  Service: Endoscopy;  Laterality: N/A;   GASTRECTOMY  03/1954   Transfused 7 pints  blood '55, 4 pints '56   GASTRECTOMY  1956   partial   HEMOSTASIS CONTROL  10/19/2019   Procedure: HEMOSTASIS CONTROL;  Surgeon: Milus Banister, MD;  Location: [facility];  Service: Endoscopy;;  hemaspray   HOT HEMOSTASIS N/A 10/19/2019   Procedure: HOT HEMOSTASIS (ARGON PLASMA COAGULATION/BICAP);  Surgeon: Milus Banister, MD;  Location: [facility];  Service: Endoscopy;  Laterality: N/A;   INSERT / REPLACE / REMOVE PACEMAKER  09/05/2014   MOLE REMOVAL Left 2008   "forearm"   TUMOR REMOVAL Left 2002   from  finger- left index    Social History Jacob [name]  reports that he has quit smoking. His smoking use included cigarettes. He started smoking about 49 years ago. He has a 60.00 pack-year smoking history. He has never used smokeless tobacco. He reports that he does not drink alcohol and does not use drugs.  family history includes Cancer in his daughter; Coronary artery disease in his father; Gout in his father; Heart attack in his father; Stomach cancer in his paternal aunt and paternal grandmother.  Allergies  Allergen Reactions   Prednisone     REACTION: GI bleed   Sulfa Antibiotics Rash and Other (See Comments)    "GI impact"       PHYSICAL EXAMINATION: Vital signs: BP 128/64    Pulse 71    Ht 5\' 7"  (1.702 m)    Wt 242 lb (109.8 kg)    BMI 37.90 kg/m   Constitutional: Pleasant, generally well-appearing, no acute distress Psychiatric: alert and oriented x3, cooperative Eyes: extraocular movements intact, anicteric, conjunctiva pink Mouth: oral pharynx moist, no lesions Neck: supple no lymphadenopathy Cardiovascular: heart regular rate and rhythm, no murmur Lungs: clear to auscultation bilaterally Abdomen: soft, obese, nontender, nondistended, no obvious ascites, no peritoneal signs, normal bowel sounds, no organomegaly Rectal: Omitted Extremities: no lower extremity edema bilaterally Skin: no lesions on visible extremities save several ecchymoses Neuro: No focal deficits. No asterixis.    ASSESSMENT:  1.  Recent GI bleed in the face of oral anticoagulation therapy secondary to "hemorrhagic gastritis".  Status post successful endoscopic treatment with APC followed by hemospray.  No recurrent bleeding.  Anticoagulation has been resumed 2.  Acute blood loss anemia.  Resolved. 3.  Medical problems   PLAN:  1.  Continue iron supplement 2.  Contact the office for any evidence of recurrent bleeding 3.  Return to the care of PCP and other specialists

## 2019-12-18 ENCOUNTER — Ambulatory Visit: Payer: Medicare Other | Admitting: Internal Medicine

## 2020-01-09 ENCOUNTER — Ambulatory Visit: Payer: Medicare Other | Attending: Internal Medicine

## 2020-01-09 ENCOUNTER — Other Ambulatory Visit (HOSPITAL_BASED_OUTPATIENT_CLINIC_OR_DEPARTMENT_OTHER): Payer: Self-pay | Admitting: Internal Medicine

## 2020-01-09 DIAGNOSIS — Z23 Encounter for immunization: Secondary | ICD-10-CM

## 2020-01-09 MED FILL — FLUAD QUADRIVALENT 0.5 ML P: 0.5 | 1 days supply | Qty: 1 | Fill #0

## 2020-01-09 NOTE — Progress Notes (Signed)
   Covid-19 Vaccination Clinic  Name:  CUAUHTEMOC HUEGEL    MRN: [id number] DOB: 15-Oct-1933  01/09/2020  Mr. Wiggs was observed post Covid-19 immunization for 15 minutes without incident. He was provided with Vaccine Information Sheet and instruction to access the V-Safe system. Vaccinated by [employer].  Mr. Koury was instructed to call 911 with any severe reactions post vaccine: Marland Kitchen Difficulty breathing  . Swelling of face and throat  . A fast heartbeat  . A bad rash all over body  . Dizziness and weakness

## 2020-02-05 ENCOUNTER — Other Ambulatory Visit: Payer: Self-pay | Admitting: Internal Medicine

## 2020-02-05 DIAGNOSIS — D568 Other thalassemias: Secondary | ICD-10-CM

## 2020-02-12 NOTE — Telephone Encounter (Signed)
error 

## 2020-04-01 ENCOUNTER — Ambulatory Visit: Payer: Medicare Other | Admitting: Cardiology

## 2020-05-01 NOTE — Progress Notes (Unsigned)
Cardiology Office Note   Date:  05/02/2020   ID:  ARTHUR SPEAGLE, DOB [birth year], MRN [id number]  PCP:  [name], MD  Cardiologist:   [name], MD   Chief Complaint  Patient presents with  . Atrial Fibrillation      History of Present Illness: CLEAVE TERNES is a 87 y.o. male who presents  for followup of his slow heart rate, fib/flutter.  He is status post pacemaker placement.  Since I last saw him he has been okay from a cardiovascular standpoint.  He is exercising routinely and brings me a very detailed exercise log.  He does 3 machines and with this he denies any chest pressure, neck or arm discomfort.  His shortness of breath is in keeping with what he is doing.  He denies any palpitations, presyncope or syncope.   Past Medical History:  Diagnosis Date  . Anemia   . Arthritis    "middle finger both hands" (09/05/2014)  . Basal cell carcinoma   . Benign prostatic hypertrophy    hx of  . Colon polyp 2006, 2010   TUBULAR ADENOMA  . Diverticulosis of colon (without mention of hemorrhage)   . First degree atrioventricular block   . GERD (gastroesophageal reflux disease)   . Gout   . History of blood transfusion 12/1953; 03/1954   Transfused 7 pints blood '55, 4 pints '56  . History of duodenal ulcer 1955   w/hemorrhage  . Kidney stones    "passed them"  . Morbid obesity ([place])    "I was 294; lost 100# 2014-2015"  . MVP (mitral valve prolapse)   . OSA (obstructive sleep apnea)    "stopped wearing mask after I lost 100#"  . Osteoarthritis   . Other thalassemia ([place])   . Pneumonia "a number of times"  . Presence of permanent cardiac pacemaker   . Prostate cancer [facility])     Past Surgical History:  Procedure Laterality Date  . BIOPSY  10/19/2019   Procedure: BIOPSY;  Surgeon: Milus Banister, MD;  Location: [facility];  Service: Endoscopy;;  . CATARACT EXTRACTION W/ INTRAOCULAR LENS  IMPLANT, BILATERAL  2002-2004  . CLOSED REDUCTION ANKLE  DISLOCATION Right 1980's   "toe my achilles"  . COLONOSCOPY  2015   multiple   . EP IMPLANTABLE DEVICE N/A 09/05/2014   Procedure: Pacemaker Implant;  Surgeon: [name], MD;  Location: [place] [facility];  Service: Cardiovascular;  Laterality: N/A;  . ESOPHAGOGASTRODUODENOSCOPY  2011   multiple  . ESOPHAGOGASTRODUODENOSCOPY (EGD) WITH PROPOFOL N/A 10/19/2019   Procedure: ESOPHAGOGASTRODUODENOSCOPY (EGD) WITH PROPOFOL;  Surgeon: Milus Banister, MD;  Location: [facility];  Service: Endoscopy;  Laterality: N/A;  . GASTRECTOMY  03/1954   Transfused 7 pints blood '55, 4 pints '56  . GASTRECTOMY  1956   partial  . HEMOSTASIS CONTROL  10/19/2019   Procedure: HEMOSTASIS CONTROL;  Surgeon: Milus Banister, MD;  Location: [facility];  Service: Endoscopy;;  hemaspray  . HOT HEMOSTASIS N/A 10/19/2019   Procedure: HOT HEMOSTASIS (ARGON PLASMA COAGULATION/BICAP);  Surgeon: Milus Banister, MD;  Location: [facility];  Service: Endoscopy;  Laterality: N/A;  . INSERT / REPLACE / REMOVE PACEMAKER  09/05/2014  . MOLE REMOVAL Left 2008   "forearm"  . TUMOR REMOVAL Left 2002   from finger- left index     Current Outpatient Medications  Medication Sig Dispense Refill  . allopurinol (ZYLOPRIM) 300 MG tablet Take 1 tablet (300 mg total)  by mouth daily. 90 tablet 3  . b complex vitamins capsule Take 1 capsule by mouth daily.    . Cholecalciferol (VITAMIN D3) 2000 UNITS capsule Take 1 capsule (2,000 Units total) by mouth daily. (Patient taking differently: Take 6,000 Units by mouth daily.)    . Coenzyme Q10 300 MG CAPS Take 300 mg by mouth daily.    . Cyanocobalamin (VITAMIN B 12 PO) Take 1,000 mg by mouth daily.    Marland Kitchen FeFum-FePoly-FA-B Cmp-C-Biot (INTEGRA PLUS) CAPS TAKE 1 CAPSULE BY MOUTH EVERY MORNING 90 capsule 0  . fexofenadine (ALLEGRA) 180 MG tablet Take 180 mg by mouth daily.    . furosemide (LASIX) 20 MG tablet TAKE 1 TABLET(20 MG) BY MOUTH DAILY 90 tablet 1  . Omega-3 Fatty Acids (FISH OIL)  600 MG CAPS Take 600 mg by mouth daily.    . pantoprazole (PROTONIX) 40 MG tablet Take 1 tablet (40 mg total) by mouth 2 (two) times daily before a meal. 60 tablet 1  . rivaroxaban (XARELTO) 20 MG TABS tablet TAKE 1 TABLET(20 MG) BY MOUTH DAILY WITH SUPPER (Patient taking differently: Take 20 mg by mouth daily with supper. TAKE 1 TABLET(20 MG) BY MOUTH DAILY WITH SUPPER) 90 tablet 3  . tamsulosin (FLOMAX) 0.4 MG CAPS capsule Take 0.4 mg by mouth daily.   11   No current facility-administered medications for this visit.    Allergies:   Prednisone and Sulfa antibiotics   ROS:  Please see the history of present illness.   Otherwise, review of systems are positive for Disordered breathing (his wife reports that he is not breathing appropriately on his CPAP).   All other systems are reviewed and negative.    PHYSICAL EXAM: VS:  BP 130/70   Pulse 67   Ht 5\' 7"  (1.702 m)   Wt 254 lb 3.2 oz (115.3 kg)   SpO2 96%   BMI 39.81 kg/m  , BMI Body mass index is 39.81 kg/m. GENERAL:  Well appearing NECK:  No jugular venous distention, waveform within normal limits, carotid upstroke brisk and symmetric, no bruits, no thyromegaly LUNGS:  Clear to auscultation bilaterally CHEST:  Well healed pacemaker pocket HEART:  PMI not displaced or sustained,S1 and S2 within normal limits, no S3, no clicks, no rubs, no murmurs, irregular ABD:  Flat, positive bowel sounds normal in frequency in pitch, no bruits, no rebound, no guarding, no midline pulsatile mass, no hepatomegaly, no splenomegaly EXT:  2 plus pulses throughout, no edema, no cyanosis no clubbing   EKG:  EKG is ordered today. The ekg ordered today demonstrates atrial fibrillation, ventricular pacing, premature ventricular contractions   Recent Labs: 10/19/2019: ALT 14 10/20/2019: BUN 20; Creatinine, Ser 0.79; Magnesium 2.1; Potassium 4.4; Sodium 140 11/01/2019: Hemoglobin 10.9; Platelets 206.0 Repeated and verified X2.    Lipid Panel    Component  Value Date/Time   CHOL 166 11/03/2012 0735   TRIG 45.0 11/03/2012 0735   HDL 49.90 11/03/2012 0735   CHOLHDL 3 11/03/2012 0735   VLDL 9.0 11/03/2012 0735   LDLCALC 107 (H) 11/03/2012 0735   LDLDIRECT 145.0 08/29/2007 1426      Wt Readings from Last 3 Encounters:  05/02/20 254 lb 3.2 oz (115.3 kg)  12/15/19 242 lb (109.8 kg)  10/19/19 251 lb 5.2 oz (114 kg)      Other studies Reviewed: Additional studies/ records that were reviewed today include: Labs. Review of the above records demonstrates:  Please see elsewhere in the note.  ASSESSMENT AND PLAN:  ATRIAL FIB - Mr.Enoc R Degiralamohas a CHA2DS2 - VASc score of 2.    He tolerates anticoagulation.  I will be checking a CBC.  BRADYCARDIA - He is status post pacemaker.    He says he is having some trouble with his home device and we will contact the pacemaker clinic today to make sure this is addressed to his satisfaction.   MR - This was trace.  No change in therapy.  CARDIOMYOPATHY - EF has been lower normal.  However, he is euvolemic and has no symptoms.  No change in therapy.  SLEEP APNEA - His wife says he is having some trouble and his breathing is irregular at night.  He has not had follow-up of his CPAP for years though he has had no machines.  His sleep apnea doctor previously with Dr. Gwenette Greet.  I will set him up as a new patient to see Dr.Sood.     Current medicines are reviewed at length with the patient today.  The patient does not have concerns regarding medicines.  The following changes have been made:  no change  Labs/ tests ordered today include:   Orders Placed This Encounter  Procedures  . CBC  . Ambulatory referral to Pulmonology  . EKG 12-Lead     Disposition:   FU with me in one year.  We will also arrange EP follow-up.   Signed, [name], MD  05/02/2020 12:18 PM    [facility]

## 2020-05-02 ENCOUNTER — Ambulatory Visit (INDEPENDENT_AMBULATORY_CARE_PROVIDER_SITE_OTHER): Payer: Medicare Other

## 2020-05-02 ENCOUNTER — Ambulatory Visit (INDEPENDENT_AMBULATORY_CARE_PROVIDER_SITE_OTHER): Payer: Medicare Other | Admitting: Cardiology

## 2020-05-02 ENCOUNTER — Encounter: Payer: Self-pay | Admitting: Cardiology

## 2020-05-02 ENCOUNTER — Other Ambulatory Visit: Payer: Self-pay

## 2020-05-02 VITALS — BP 130/70 | HR 67 | Ht 67.0 in | Wt 254.2 lb

## 2020-05-02 DIAGNOSIS — G4733 Obstructive sleep apnea (adult) (pediatric): Secondary | ICD-10-CM

## 2020-05-02 DIAGNOSIS — Z79899 Other long term (current) drug therapy: Secondary | ICD-10-CM

## 2020-05-02 DIAGNOSIS — I482 Chronic atrial fibrillation, unspecified: Secondary | ICD-10-CM | POA: Diagnosis not present

## 2020-05-02 DIAGNOSIS — I442 Atrioventricular block, complete: Secondary | ICD-10-CM | POA: Diagnosis not present

## 2020-05-02 DIAGNOSIS — Z9989 Dependence on other enabling machines and devices: Secondary | ICD-10-CM

## 2020-05-02 DIAGNOSIS — R001 Bradycardia, unspecified: Secondary | ICD-10-CM | POA: Diagnosis not present

## 2020-05-02 NOTE — Patient Instructions (Signed)
Medication Instructions:  No changes *If you need a refill on your cardiac medications before your next appointment, please call your pharmacy*  Labs: Your physician recommends that you return for lab work today: (CBC)  Testing/Procedures: Referred to Dr. Halford Chessman for new patient sleep appointment  Follow-Up: At [facility], you and your health needs are our priority.  As part of our continuing mission to provide you with exceptional heart care, we have created designated Provider Care Teams.  These Care Teams include your primary Cardiologist (physician) and Advanced Practice Providers (APPs -  Physician Assistants and Nurse Practitioners) who all work together to provide you with the care you need, when you need it.  Your next appointment:   12 month(s) You will receive a reminder letter in the mail two months in advance. If you don't receive a letter, please call our office to schedule the follow-up appointment.  The format for your next appointment:   In Person  Provider:   [name], MD

## 2020-05-02 NOTE — Telephone Encounter (Signed)
Spoke with pt, assisted with sending manual transmission.  25000 Remote monitor working appropriately.

## 2020-05-03 ENCOUNTER — Telehealth: Payer: Self-pay | Admitting: Cardiology

## 2020-05-03 LAB — CUP PACEART REMOTE DEVICE CHECK
Battery Remaining Longevity: 24 mo
Battery Voltage: 2.94 V
Brady Statistic RA Percent Paced: 0.09 %
Brady Statistic RV Percent Paced: 98.51 %
Date Time Interrogation Session: 20220217153706
Implantable Lead Implant Date: 20160622
Implantable Lead Implant Date: 20160622
Implantable Lead Location: 753859
Implantable Lead Location: 753860
Implantable Lead Model: 5076
Implantable Lead Model: 5076
Implantable Pulse Generator Implant Date: 20160622
Lead Channel Impedance Value: 342 Ohm
Lead Channel Impedance Value: 342 Ohm
Lead Channel Impedance Value: 437 Ohm
Lead Channel Impedance Value: 456 Ohm
Lead Channel Pacing Threshold Amplitude: 0.625 V
Lead Channel Pacing Threshold Amplitude: 0.75 V
Lead Channel Pacing Threshold Pulse Width: 0.4 ms
Lead Channel Pacing Threshold Pulse Width: 0.4 ms
Lead Channel Sensing Intrinsic Amplitude: 13.625 mV
Lead Channel Sensing Intrinsic Amplitude: 13.625 mV
Lead Channel Sensing Intrinsic Amplitude: 3.375 mV
Lead Channel Sensing Intrinsic Amplitude: 3.375 mV
Lead Channel Setting Pacing Amplitude: 2 V
Lead Channel Setting Pacing Amplitude: 2.5 V
Lead Channel Setting Pacing Pulse Width: 0.4 ms
Lead Channel Setting Sensing Sensitivity: 4 mV

## 2020-05-03 LAB — CBC
Hematocrit: 42.5 % (ref 37.5–51.0)
Hemoglobin: 12.8 g/dL — ABNORMAL LOW (ref 13.0–17.7)
MCH: 19.9 pg — ABNORMAL LOW (ref 26.6–33.0)
MCHC: 30.1 g/dL — ABNORMAL LOW (ref 31.5–35.7)
MCV: 66 fL — ABNORMAL LOW (ref 79–97)
Platelets: 198 10*3/uL (ref 150–450)
RBC: 6.43 x10E6/uL — ABNORMAL HIGH (ref 4.14–5.80)
RDW: 19.1 % — ABNORMAL HIGH (ref 11.6–15.4)
WBC: 6.2 10*3/uL (ref 3.4–10.8)

## 2020-05-03 NOTE — Telephone Encounter (Signed)
Spoke with patient regarding appointment with Dr. Halford Chessman [facility] ) scheduled 3/234/22 at 68"30 am--arrival time is 9:15 am [address], [address] for check in ---phone # [phone].  Patient voiced his understanding.

## 2020-05-09 NOTE — Progress Notes (Signed)
Remote pacemaker transmission.   

## 2020-05-19 ENCOUNTER — Other Ambulatory Visit: Payer: Self-pay | Admitting: Internal Medicine

## 2020-05-19 DIAGNOSIS — D568 Other thalassemias: Secondary | ICD-10-CM

## 2020-06-01 ENCOUNTER — Other Ambulatory Visit: Payer: Self-pay | Admitting: Cardiology

## 2020-06-06 ENCOUNTER — Encounter: Payer: Self-pay | Admitting: Pulmonary Disease

## 2020-06-06 ENCOUNTER — Ambulatory Visit (INDEPENDENT_AMBULATORY_CARE_PROVIDER_SITE_OTHER): Payer: Medicare Other | Admitting: Pulmonary Disease

## 2020-06-06 ENCOUNTER — Other Ambulatory Visit: Payer: Self-pay

## 2020-06-06 VITALS — BP 114/60 | HR 75 | Temp 97.3°F | Ht 67.0 in | Wt 250.6 lb

## 2020-06-06 DIAGNOSIS — G4730 Sleep apnea, unspecified: Secondary | ICD-10-CM | POA: Diagnosis not present

## 2020-06-06 DIAGNOSIS — G4733 Obstructive sleep apnea (adult) (pediatric): Secondary | ICD-10-CM

## 2020-06-06 DIAGNOSIS — E669 Obesity, unspecified: Secondary | ICD-10-CM | POA: Diagnosis not present

## 2020-06-06 DIAGNOSIS — G4734 Idiopathic sleep related nonobstructive alveolar hypoventilation: Secondary | ICD-10-CM

## 2020-06-06 NOTE — Progress Notes (Signed)
[place] [place] [facility], [facility] Sleep Medicine  Chief Complaint  Patient presents with  . Consult    Patient wears CPAP, wife states that his breathing is irregular at night with the CPAP on. Machine is working good, patient feels like he is sleeping good.     Constitutional:  BP 114/60 (BP Location: Right Arm, Patient Position: Sitting, Cuff Size: Large)   Pulse 75   Temp (!) 97.3 F (36.3 C) (Temporal)   Ht 5\' 7"  (1.702 m)   Wt 250 lb 9.6 oz (113.7 kg)   SpO2 95%   BMI 39.25 kg/m   Past Medical History:  Anemia, OA, BPH, Colon polyp, Diverticulosis, GERD, Gout, PUD s/p partial gastrectomy, Nephrolithiasis, Thalassemia, Pneumonia, Prostate cancer, s/p PM, Atrial fibrillation  Past Surgical History:  He  has a past surgical history that includes Tumor removal (Left, 2002); Mole removal (Left, 2008); Cataract extraction w/ intraocular lens  implant, bilateral (2002-2004); Gastrectomy (03/1954); Colonoscopy (2015); Esophagogastroduodenoscopy (2011); Cardiac catheterization (N/A, 09/05/2014); Insert / replace / remove pacemaker (09/05/2014); Closed reduction ankle dislocation (Right, 1980's); Gastrectomy (1956); Esophagogastroduodenoscopy (egd) with propofol (N/A, 10/19/2019); Hot hemostasis (N/A, 10/19/2019); biopsy (10/19/2019); and Hemostasis control (10/19/2019).  Brief Summary:  Jacob [name] is a 87 y.o. male former smoker with obstructive sleep apnea.      Subjective:   He is here with his wife.  He was diagnosed with sleep apnea decades ago and has been on CPAP.  He is on his 4 th machine.    His wife says his breathing gets shallow while he is using his CPAP.  He uses full face mask.  No issues with mask fit.  He has notices that his memory and focus aren't as sharp as before.  He has been using auto CPAP 10 to 18 cm H2O.  Physical Exam:   Appearance - well kempt   ENMT - no sinus tenderness, no oral exudate, no LAN, Mallampati 3 airway, no stridor  Respiratory  - equal breath sounds bilaterally, no wheezing or rales  CV - s1s2 regular rate and rhythm, no murmurs  Ext - no clubbing, no edema  Skin - no rashes  Psych - normal mood and affect   Sleep Tests:   HST 04/09/16 >> AHI 49, SpO2 low 75%  Auto CPAP 05/07/20 to 06/05/20 >> used on 30 of 30 nights with average 4 hrs 58 min.  Average AHI 32.9 with median CPAP 12 and 95 th percentile CPAP 15 cm H2O  Cardiac Tests:   Echo 09/07/16 >> EF 50%, mod LVH, severe LA dilation  Social History:  He  reports that he quit smoking about 51 years ago. His smoking use included cigarettes. He started smoking about 50 years ago. He has a 60.00 pack-year smoking history. He has never used smokeless tobacco. He reports that he does not drink alcohol and does not use drugs.  Family History:  His family history includes Cancer in his daughter; Coronary artery disease in his father; Gout in his father; Heart attack in his father; Stomach cancer in his paternal aunt and paternal grandmother.    Discussion:  He has long history of sleep apnea.  He has been compliant with CPAP.  He is having more difficulty with CPAP use and likely has also developed hypoventilation.  He has been tried on different CPAP settings and different mask fits.  He still has significant elevation in his AHI.  As such, it is my medical opinion that he would most likely  be better served by switching to Bipap therapy.  Assessment/Plan:   Obstructive sleep apnea. - CPAP therapy no longer effective - he uses [facility] for his DME - will arrange for Bipap titration study  Obesity. - discussed importance of weight loss  Atrial fibrillation, bradycardia s/p pacemaker. - followed by Dr. [name] with [facility]  Thalassemia minor, iron deficiency anemia. - followed by Dr. Curt Bears with [place]  Time Spent Involved in Patient Care on Day of Examination:  32 minutes  Follow up:  Patient Instructions  Will  arrange for Bipap titration study and call to schedule follow up after this is reviewed   Medication List:   Allergies as of 06/06/2020      Reactions   Prednisone    REACTION: GI bleed   Sulfa Antibiotics Rash, Other (See Comments)   "GI impact"      Medication List       Accurate as of June 06, 2020 10:07 AM. If you have any questions, ask your nurse or doctor.        allopurinol 300 MG tablet Commonly known as: ZYLOPRIM Take 1 tablet (300 mg total) by mouth daily.   b complex vitamins capsule Take 1 capsule by mouth daily.   Coenzyme Q10 300 MG Caps Take 300 mg by mouth daily.   fexofenadine 180 MG tablet Commonly known as: ALLEGRA Take 180 mg by mouth daily.   Fish Oil 600 MG Caps Take 600 mg by mouth daily.   furosemide 20 MG tablet Commonly known as: LASIX TAKE 1 TABLET(20 MG) BY MOUTH DAILY   Integra Plus Caps TAKE 1 CAPSULE BY MOUTH EVERY MORNING   pantoprazole 40 MG tablet Commonly known as: Protonix Take 1 tablet (40 mg total) by mouth 2 (two) times daily before a meal.   tamsulosin 0.4 MG Caps capsule Commonly known as: FLOMAX Take 0.4 mg by mouth daily.   VITAMIN B 12 PO Take 1,000 mg by mouth daily.   Vitamin D3 50 MCG (2000 UT) capsule Take 1 capsule (2,000 Units total) by mouth daily. What changed: how much to take   Xarelto 20 MG Tabs tablet Generic drug: rivaroxaban TAKE 1 TABLET(20 MG) BY MOUTH DAILY WITH SUPPER       Signature:  Chesley Mires, MD [place] Pager - [phone] 06/06/2020, 10:07 AM

## 2020-06-06 NOTE — Patient Instructions (Signed)
Will arrange for Bipap titration study and call to schedule follow up after this is reviewed

## 2020-06-22 ENCOUNTER — Other Ambulatory Visit: Payer: Self-pay | Admitting: Cardiology

## 2020-08-01 ENCOUNTER — Ambulatory Visit (INDEPENDENT_AMBULATORY_CARE_PROVIDER_SITE_OTHER): Payer: Medicare Other

## 2020-08-01 DIAGNOSIS — I440 Atrioventricular block, first degree: Secondary | ICD-10-CM

## 2020-08-05 ENCOUNTER — Ambulatory Visit: Payer: Medicare Other

## 2020-08-05 LAB — CUP PACEART REMOTE DEVICE CHECK
Battery Remaining Longevity: 23 mo
Battery Voltage: 2.93 V
Brady Statistic RA Percent Paced: 0.09 %
Brady Statistic RV Percent Paced: 99.22 %
Date Time Interrogation Session: 20220519114526
Implantable Lead Implant Date: 20160622
Implantable Lead Implant Date: 20160622
Implantable Lead Location: 753859
Implantable Lead Location: 753860
Implantable Lead Model: 5076
Implantable Lead Model: 5076
Implantable Pulse Generator Implant Date: 20160622
Lead Channel Impedance Value: 323 Ohm
Lead Channel Impedance Value: 342 Ohm
Lead Channel Impedance Value: 437 Ohm
Lead Channel Impedance Value: 437 Ohm
Lead Channel Pacing Threshold Amplitude: 0.625 V
Lead Channel Pacing Threshold Amplitude: 0.875 V
Lead Channel Pacing Threshold Pulse Width: 0.4 ms
Lead Channel Pacing Threshold Pulse Width: 0.4 ms
Lead Channel Sensing Intrinsic Amplitude: 1.25 mV
Lead Channel Sensing Intrinsic Amplitude: 1.25 mV
Lead Channel Sensing Intrinsic Amplitude: 11.375 mV
Lead Channel Sensing Intrinsic Amplitude: 11.375 mV
Lead Channel Setting Pacing Amplitude: 2 V
Lead Channel Setting Pacing Amplitude: 2.5 V
Lead Channel Setting Pacing Pulse Width: 0.4 ms
Lead Channel Setting Sensing Sensitivity: 4 mV

## 2020-08-05 NOTE — Progress Notes (Signed)
   Covid-19 Vaccination Clinic  Name:  Jacob [name]    MRN: [id number] DOB: September 02, 1933  08/05/2020  Jacob [name] was observed post Covid-19 immunization for 15 minutes without incident. He was provided with Vaccine Information Sheet and instruction to access the V-Safe system.   Jacob [name] was instructed to call 911 with any severe reactions post vaccine: Marland Kitchen Difficulty breathing  . Swelling of face and throat  . A fast heartbeat  . A bad rash all over body  . Dizziness and weakness

## 2020-08-07 ENCOUNTER — Other Ambulatory Visit: Payer: Self-pay

## 2020-08-07 ENCOUNTER — Ambulatory Visit (HOSPITAL_BASED_OUTPATIENT_CLINIC_OR_DEPARTMENT_OTHER): Payer: Medicare Other | Attending: Pulmonary Disease | Admitting: Pulmonary Disease

## 2020-08-07 DIAGNOSIS — G4733 Obstructive sleep apnea (adult) (pediatric): Secondary | ICD-10-CM | POA: Insufficient documentation

## 2020-08-07 DIAGNOSIS — G4734 Idiopathic sleep related nonobstructive alveolar hypoventilation: Secondary | ICD-10-CM | POA: Insufficient documentation

## 2020-08-07 DIAGNOSIS — E669 Obesity, unspecified: Secondary | ICD-10-CM

## 2020-08-07 DIAGNOSIS — G4730 Sleep apnea, unspecified: Secondary | ICD-10-CM

## 2020-08-08 ENCOUNTER — Other Ambulatory Visit (HOSPITAL_BASED_OUTPATIENT_CLINIC_OR_DEPARTMENT_OTHER): Payer: Self-pay

## 2020-08-08 DIAGNOSIS — G4734 Idiopathic sleep related nonobstructive alveolar hypoventilation: Secondary | ICD-10-CM

## 2020-08-08 DIAGNOSIS — G4733 Obstructive sleep apnea (adult) (pediatric): Secondary | ICD-10-CM

## 2020-08-08 DIAGNOSIS — E669 Obesity, unspecified: Secondary | ICD-10-CM

## 2020-08-09 ENCOUNTER — Telehealth: Payer: Self-pay | Admitting: Pulmonary Disease

## 2020-08-09 ENCOUNTER — Other Ambulatory Visit (HOSPITAL_BASED_OUTPATIENT_CLINIC_OR_DEPARTMENT_OTHER): Payer: Self-pay

## 2020-08-09 DIAGNOSIS — G4734 Idiopathic sleep related nonobstructive alveolar hypoventilation: Secondary | ICD-10-CM

## 2020-08-09 DIAGNOSIS — E669 Obesity, unspecified: Secondary | ICD-10-CM

## 2020-08-09 DIAGNOSIS — G4733 Obstructive sleep apnea (adult) (pediatric): Secondary | ICD-10-CM

## 2020-08-09 DIAGNOSIS — G4730 Sleep apnea, unspecified: Secondary | ICD-10-CM

## 2020-08-09 MED ORDER — MODERNA COVID-19 VACCINE 100 MCG/0.5ML IM SUSP
INTRAMUSCULAR | 0 refills | Status: DC
  Filled 2020-08-09: qty 0.25, 1d supply, fill #0

## 2020-08-09 NOTE — Procedures (Signed)
   Patient Name: Jacob [name], [place] Date: 08/07/2020 Gender: Male D.O.B: 19-Oct-1933 Age (years): 37 Referring Provider: Chesley Mires MD, ABSM Height (inches): 50 Interpreting Physician: Chesley Mires MD, ABSM Weight (lbs): 246 RPSGT: Carolin Coy BMI: 104 MRN: [id number] Neck Size: 16.00  CLINICAL INFORMATION The patient is referred for a BiPAP titration to treat sleep apnea.  Date of HST 04/09/16: AHI 49, SpO2 low 75%.  SLEEP STUDY TECHNIQUE As per the AASM Manual for the Scoring of Sleep and Associated Events v2.3 (April 2016) with a hypopnea requiring 4% desaturations.  The channels recorded and monitored were frontal, central and occipital EEG, electrooculogram (EOG), submentalis EMG (chin), nasal and oral airflow, thoracic and abdominal wall motion, anterior tibialis EMG, snore microphone, electrocardiogram, and pulse oximetry. Bilevel positive airway pressure (BPAP) was initiated at the beginning of the study and titrated to treat sleep-disordered breathing.  MEDICATIONS Medications self-administered by patient taken the night of the study : MELATONIN, MAGNESIUM  RESPIRATORY PARAMETERS Optimal IPAP Pressure (cm): 23 AHI at Optimal Pressure (/hr) 0 Optimal EPAP Pressure (cm): 19   Overall Minimal O2 (%): 86.0 Minimal O2 at Optimal Pressure (%): 92.0  SLEEP ARCHITECTURE Start Time: 11:04:10 PM Stop Time: 5:18:01 AM Total Time (min): 373.8 Total Sleep Time (min): 253.5 Sleep Latency (min): 0.2 Sleep Efficiency (%): 67.8% REM Latency (min): 56.0 WASO (min): 120.1 Stage N1 (%): 30.8% Stage N2 (%): 57.2% Stage N3 (%): 0.0% Stage R (%): 12 Supine (%): 59.55 Arousal Index (/hr): 46.6   CARDIAC DATA The 2 lead EKG demonstrated sinus rhythm. The mean heart rate was 61.6 beats per minute. Other EKG findings include: PVCs, Atrial Flutter.  LEG MOVEMENT DATA The total Periodic Limb Movements of Sleep (PLMS) were 0. The PLMS index was 0.0. A PLMS index of <15 is considered  normal in adults.  IMPRESSIONS - Difficult titration study. - He had continue respiratory events despite being tried on different Bipap settings. - Seemed like Bipap 23/19 was best at controlling obstructive events, but developed central apneas with higher pressures. - He didn't require supplemental oxygen during this study.  DIAGNOSIS - Obstructive Sleep Apnea  - Treatment Emergenct Central Sleep Apnea  RECOMMENDATIONS - Trial of Auto BiPAP therapy with minimum EPAP 10, Maximum IPAP 25, and Pressure support 5 cm H2O. - He was fitted with a Large size Resmed Full Face Mask AirFit F10 mask and heated humidification. - Avoid alcohol, sedatives and other CNS depressants that may worsen sleep apnea and disrupt normal sleep architecture. - Sleep hygiene should be reviewed to assess factors that may improve sleep quality. - Weight management and regular exercise should be initiated or continued.  [Electronically signed] 08/09/2020 01:21 PM  Chesley Mires MD, [place], American Board of Sleep Medicine   NPI: [id number]

## 2020-08-09 NOTE — Telephone Encounter (Signed)
Called and went over Titration study note per Dr Halford Chessman with patient. All questions answered and patient agreeable with Dr Juanetta Gosling recommendation for Bipap. Order placed per Dr Halford Chessman. Patient expressed full understanding to all and schedule office visit 2 to 3 months after getting auto Bipap. Nothing further needed at this time.

## 2020-08-09 NOTE — Telephone Encounter (Signed)
Bipap 08/07/20 >> difficult titration.  Central apnea with higher pressures.   Please let him know that his titration study was reviewed.  Please send order to [facility] to arrange for auto Bipap with minimum EPAP 10, maximum IPAP 25, and pressure support 5 cm H2O.  He needs ROV in 2 to 3 months after getting auto Bipap.

## 2020-08-22 ENCOUNTER — Telehealth: Payer: Self-pay | Admitting: Internal Medicine

## 2020-08-22 DIAGNOSIS — I10 Essential (primary) hypertension: Secondary | ICD-10-CM

## 2020-08-22 DIAGNOSIS — I480 Paroxysmal atrial fibrillation: Secondary | ICD-10-CM

## 2020-08-22 NOTE — Telephone Encounter (Signed)
August 22, 2020  [name], MD to Waylan Rocher, LPN      6:18 PM I agree with the excellent advice.

## 2020-08-22 NOTE — Telephone Encounter (Signed)
Pt c/o swelling: STAT is pt has developed SOB within 24 hours  How much weight have you gained and in what time span? Yes, pts old scale broke and he got a new scale recently  If swelling, where is the swelling located? Bilateral ankle swelling  Are you currently taking a fluid pill? Yes 20mg   Are you currently SOB? Only when he walks up a hill  Do you have a log of your daily weights (if so, list)? No  Have you gained 3 pounds in a day or 5 pounds in a week? 12lbs in 2 weeks  Have you traveled recently? No only to Dr.s appts and to see his children which are located in [place]  240lbs to 256lbs in 2 weeks I attempted to make an appt with Joseph Art' for his yearly but pt prefers to see Dr. Lovena Le

## 2020-08-22 NOTE — Telephone Encounter (Signed)
Spoke with pt he states that he is having issues with LE swelling. He is in AFIB for "about a month, this is not unusual for me" he is also having DOE and dizziness upon standing this resolves in a few seconds. H/o MV prolapse. Pt states that he has been taking lasix 20mg  "for years" I do not see a any current labs. I will have pt increase lasix to 40mg  x3 day and see if that will help. Pt/wife will call back over the weekend if this does not help. Pt will go to the ER "if he fills necessary".

## 2020-08-23 NOTE — Progress Notes (Signed)
Remote pacemaker transmission.   

## 2020-08-26 NOTE — Telephone Encounter (Signed)
Patient was calling back asking to speak to nights

## 2020-08-26 NOTE — Telephone Encounter (Signed)
Spoke to patient he stated he would like to keep taking Lasix 40 mg daily.Stated 20 mg does not work anymore.Advised I will send message to Dr.Hochrein for advice.

## 2020-08-27 ENCOUNTER — Other Ambulatory Visit: Payer: Self-pay

## 2020-08-27 DIAGNOSIS — I10 Essential (primary) hypertension: Secondary | ICD-10-CM

## 2020-08-27 DIAGNOSIS — I480 Paroxysmal atrial fibrillation: Secondary | ICD-10-CM

## 2020-08-27 MED ORDER — FUROSEMIDE 40 MG PO TABS: 40.0000 mg | ORAL_TABLET | Freq: Every day | ORAL | 3 refills | Status: DC

## 2020-08-27 NOTE — Addendum Note (Signed)
Addended by: Kathyrn Lass on: 08/27/2020 10:57 AM   Modules accepted: Orders

## 2020-08-27 NOTE — Telephone Encounter (Signed)
Spoke to patient Dr.Hochrein advised ok to take Lasix 40 mg daily.Advised to have a bmet in 7 days.Orders placed.

## 2020-09-03 LAB — BASIC METABOLIC PANEL
BUN/Creatinine Ratio: 31 — ABNORMAL HIGH (ref 10–24)
BUN: 30 mg/dL — ABNORMAL HIGH (ref 8–27)
CO2: 24 mmol/L (ref 20–29)
Calcium: 9.7 mg/dL (ref 8.6–10.2)
Chloride: 104 mmol/L (ref 96–106)
Creatinine, Ser: 0.98 mg/dL (ref 0.76–1.27)
Glucose: 68 mg/dL (ref 65–99)
Potassium: 5 mmol/L (ref 3.5–5.2)
Sodium: 141 mmol/L (ref 134–144)
eGFR: 76 mL/min/{1.73_m2} (ref >59)

## 2020-09-09 ENCOUNTER — Other Ambulatory Visit: Payer: Self-pay | Admitting: Internal Medicine

## 2020-09-09 DIAGNOSIS — D568 Other thalassemias: Secondary | ICD-10-CM

## 2020-09-10 ENCOUNTER — Other Ambulatory Visit: Payer: Self-pay

## 2020-09-10 ENCOUNTER — Ambulatory Visit (INDEPENDENT_AMBULATORY_CARE_PROVIDER_SITE_OTHER): Payer: Medicare Other | Admitting: Internal Medicine

## 2020-09-10 ENCOUNTER — Encounter: Payer: Self-pay | Admitting: Internal Medicine

## 2020-09-10 VITALS — BP 120/80 | HR 81 | Ht 67.0 in | Wt 247.2 lb

## 2020-09-10 DIAGNOSIS — I442 Atrioventricular block, complete: Secondary | ICD-10-CM

## 2020-09-10 DIAGNOSIS — Z950 Presence of cardiac pacemaker: Secondary | ICD-10-CM

## 2020-09-10 DIAGNOSIS — I480 Paroxysmal atrial fibrillation: Secondary | ICD-10-CM

## 2020-09-10 NOTE — Progress Notes (Signed)
HPI [name] returns today for ongoing PPM followup. He has CHB, persistent atrial fib, and obesity. He has gained over 10 lbs in the time of Covid. He admits to being sedentary. He denies syncope. He has been reluctant to exercise. Allergies  Allergen Reactions   Prednisone     REACTION: GI bleed   Sulfa Antibiotics Rash and Other (See Comments)    "GI impact"     Current Outpatient Medications  Medication Sig Dispense Refill   allopurinol (ZYLOPRIM) 300 MG tablet Take 1 tablet (300 mg total) by mouth daily. 90 tablet 3   amoxicillin (AMOXIL) 500 MG capsule SMARTSIG:4 Capsule(s) By Mouth Once     b complex vitamins capsule Take 1 capsule by mouth daily.     Cholecalciferol (VITAMIN D3) 2000 UNITS capsule Take 1 capsule (2,000 Units total) by mouth daily.     Coenzyme Q10 300 MG CAPS Take 300 mg by mouth daily.     Cyanocobalamin (VITAMIN B 12 PO) Take 1,000 mg by mouth daily.     FeFum-FePoly-FA-B Cmp-C-Biot (INTEGRA PLUS) CAPS TAKE 1 CAPSULE BY MOUTH EVERY MORNING 90 capsule 0   fexofenadine (ALLEGRA) 180 MG tablet Take 180 mg by mouth daily.     furosemide (LASIX) 40 MG tablet Take 1 tablet (40 mg total) by mouth daily. 90 tablet 3   Omega-3 Fatty Acids (FISH OIL) 600 MG CAPS Take 600 mg by mouth daily.     tamsulosin (FLOMAX) 0.4 MG CAPS capsule Take 0.4 mg by mouth daily.   11   XARELTO 20 MG TABS tablet TAKE 1 TABLET(20 MG) BY MOUTH DAILY WITH SUPPER 90 tablet 1   pantoprazole (PROTONIX) 40 MG tablet Take 1 tablet (40 mg total) by mouth 2 (two) times daily before a meal. 60 tablet 1   No current facility-administered medications for this visit.     Past Medical History:  Diagnosis Date   Anemia    Arthritis    "middle finger both hands" (09/05/2014)   Basal cell carcinoma    Benign prostatic hypertrophy    hx of   Colon polyp 2006, 2010   TUBULAR ADENOMA   Diverticulosis of colon (without mention of hemorrhage)    First degree atrioventricular block     GERD (gastroesophageal reflux disease)    Gout    History of blood transfusion 12/1953; 03/1954   Transfused 7 pints blood '55, 4 pints '56   History of duodenal ulcer 1955   w/hemorrhage   Kidney stones    "passed them"   Morbid obesity ([place])    "I was 294; lost 100# 2014-2015"   MVP (mitral valve prolapse)    OSA (obstructive sleep apnea)    "stopped wearing mask after I lost 100#"   Osteoarthritis    Other thalassemia ([place])    Pneumonia "a number of times"   Presence of permanent cardiac pacemaker    Prostate cancer ([place])     ROS:   All systems reviewed and negative except as noted in the HPI.   Past Surgical History:  Procedure Laterality Date   BIOPSY  10/19/2019   Procedure: BIOPSY;  Surgeon: Milus Banister, MD;  Location: [facility];  Service: Endoscopy;;   CATARACT EXTRACTION W/ INTRAOCULAR LENS  IMPLANT, BILATERAL  2002-2004   CLOSED REDUCTION ANKLE DISLOCATION Right 1980's   "toe my achilles"   COLONOSCOPY  2015   multiple    EP IMPLANTABLE DEVICE N/A 09/05/2014   Procedure: Pacemaker Implant;  Surgeon: [name], MD;  Location: [facility];  Service: Cardiovascular;  Laterality: N/A;   ESOPHAGOGASTRODUODENOSCOPY  2011   multiple   ESOPHAGOGASTRODUODENOSCOPY (EGD) WITH PROPOFOL N/A 10/19/2019   Procedure: ESOPHAGOGASTRODUODENOSCOPY (EGD) WITH PROPOFOL;  Surgeon: Milus Banister, MD;  [facility];  Service: Endoscopy;  Laterality: N/A;   GASTRECTOMY  03/1954   Transfused 7 pints blood '55, 4 pints '56   GASTRECTOMY  1956   partial   HEMOSTASIS CONTROL  10/19/2019   Procedure: HEMOSTASIS CONTROL;  Surgeon: Milus Banister, MD;  [facility];  Service: Endoscopy;;  hemaspray   HOT HEMOSTASIS N/A 10/19/2019   Procedure: HOT HEMOSTASIS (ARGON PLASMA COAGULATION/BICAP);  Surgeon: Milus Banister, MD;  Location: [facility];  Service: Endoscopy;  Laterality: N/A;   INSERT / REPLACE / REMOVE PACEMAKER  09/05/2014   MOLE REMOVAL Left 2008    "forearm"   TUMOR REMOVAL Left 2002   from finger- left index     Family History  Problem Relation Age of Onset   Coronary artery disease Father    Heart attack Father    Gout Father    Stomach cancer Paternal Grandmother    Cancer Daughter        skin   Stomach cancer Paternal Aunt    Colon cancer Neg Hx    Esophageal cancer Neg Hx    Pancreatic cancer Neg Hx      Social History   Socioeconomic History   Marital status: Married    Spouse name: ANn   Number of children: 8   Years of education: 18+   Highest education level: Not on file  Occupational History   Occupation: executive    Comment: retired  Tobacco Use   Smoking status: Former    Packs/day: 3.00    Years: 20.00    Pack years: 60.00    Types: Cigarettes    Start date: 01/21/1970    Quit date: 02/1970    Years since quitting: 50.6   Smokeless tobacco: Never   Tobacco comments:    quite smoking cigarettes in [place] Use   Vaping Use: Never used  Substance and Sexual Activity   Alcohol use: No    Comment: "no alcohol since 1987"   Drug use: No   Sexual activity: Yes    Partners: Female  Other Topics Concern   Not on file  Social History Narrative   [employer] of [place]; [place]. Married '59. Eight Daughters '60, '62, '66, '70, '73, '74, '78, '83; 44 grandchildren 1great grandchild. Lives independently with wife. ACP- asked pt to think about these issues (DEc '12)      Social Determinants of Radio broadcast assistant Strain: Not on file  Food Insecurity: Not on file  Transportation Needs: Not on file  Physical Activity: Not on file  Stress: Not on file  Social Connections: Not on file  Intimate Partner Violence: Not on file     BP 120/80   Pulse 81   Ht 5\' 7"  (1.702 m)   Wt 247 lb 3.2 oz (112.1 kg)   SpO2 97%   BMI 38.72 kg/m   Physical Exam:  Well appearing NAD HEENT: Unremarkable Neck:  No JVD, no thyromegally Lymphatics:  No adenopathy Back:  No CVA  tenderness Lungs:  Clear HEART:  Regular rate rhythm, no murmurs, no rubs, no clicks Abd:  soft, positive bowel sounds, no organomegally, no rebound, no guarding Ext:  2 plus  pulses, no edema, no cyanosis, no clubbing Skin:  No rashes no nodules Neuro:  CN II through XII intact, motor grossly intact  EKG  DEVICE  Normal device function.  See PaceArt for details.   Assess/Plan:  1. Atrial fib - his VR is well controlled. He will continue his current meds. 2. PPM - his medtronic DDD PM is working normally. We will recheck in several months. 3. Obesity- he continues to gain weight. I strongly encouraged him to use his stationary bike. We also discussed intermittent fasting.  4. CHB - he has no escape today and is pacing 100% of the time.  Carleene Overlie Cameryn Chrisley,MD

## 2020-09-10 NOTE — Patient Instructions (Addendum)
Medication Instructions:  Your physician recommends that you continue on your current medications as directed. Please refer to the Current Medication list given to you today.  Labwork: None ordered.  Testing/Procedures: Your physician has requested that you have an echocardiogram. Echocardiography is a painless test that uses sound waves to create images of your heart. It provides your doctor with information about the size and shape of your heart and how well your heart's chambers and valves are working. This procedure takes approximately one hour. There are no restrictions for this procedure.  Please schedule for ECHO  Follow-Up: Your physician wants you to follow-up in: one year with Cristopher Peru, MD or one of the following Advanced Practice Providers on your designated Care Team:   Tommye Standard, [place] Legrand Como "Jonni Sanger" Chalmers Cater, [place]  Remote monitoring is used to monitor your Pacemaker from home. This monitoring reduces the number of office visits required to check your device to one time per year. It allows [place] to keep an eye on the functioning of your device to ensure it is working properly. You are scheduled for a device check from home on 10/31/2020. You may send your transmission at any time that day. If you have a wireless device, the transmission will be sent automatically. After your physician reviews your transmission, you will receive a postcard with your next transmission date.  Any Other Special Instructions Will Be Listed Below (If Applicable).  If you need a refill on your cardiac medications before your next appointment, please call your pharmacy.

## 2020-10-03 ENCOUNTER — Ambulatory Visit (HOSPITAL_COMMUNITY): Payer: Medicare Other | Attending: Cardiology

## 2020-10-03 ENCOUNTER — Other Ambulatory Visit: Payer: Self-pay

## 2020-10-03 DIAGNOSIS — I480 Paroxysmal atrial fibrillation: Secondary | ICD-10-CM | POA: Insufficient documentation

## 2020-10-03 LAB — ECHOCARDIOGRAM COMPLETE
AR max vel: 1.19 cm2
AV Area VTI: 1.21 cm2
AV Area mean vel: 1.22 cm2
AV Mean grad: 13 mmHg
AV Peak grad: 19.7 mmHg
Ao pk vel: 2.22 m/s
Area-P 1/2: 2.72 cm2
P 1/2 time: 657 msec
S' Lateral: 3.2 cm

## 2020-10-07 ENCOUNTER — Telehealth: Payer: Self-pay | Admitting: Pulmonary Disease

## 2020-10-07 DIAGNOSIS — G4733 Obstructive sleep apnea (adult) (pediatric): Secondary | ICD-10-CM

## 2020-10-07 NOTE — Telephone Encounter (Signed)
Called and spoke with patient and his wife Jacob [name]. Jacob [name] reported for the past few nights, the patient appears to stop breathing for about 40 secs-1 min. He has been wearing his bipap every night. He recently switched from a cpap machine to bipap machine on 09/20/20. He is now using a Personnel officer. His wife is concerned about him gasping for air.   I called [employer] to see if they had any data for the patient for the APP of the day to review. Spoke with the respiratory therapist who stated that the patient needed to scan a QR code or bring his machine into the office in order to get a download.   I called the patient back to see if he could scan the QR code but he nor his wife answered. I left a message for them to give our office a call back.

## 2020-10-07 NOTE — Telephone Encounter (Signed)
Pts wife stated that she observed him breathing more rapidly than usually and stated that she has noticed apnea for up to 1 minute with him going without breathing. Stated that he has been using his BIPAP machine and stated that he kept having a faster breathing that she has not noticed before. Pts wife stated they dont know if Dr. Halford Chessman wants to adjust his BIPAP settings.   Pls regard; [phone]

## 2020-10-10 NOTE — Telephone Encounter (Signed)
Called and spoke to pt. Informed pt of the information per Cherina. Pt states he will bring his machine in for a DL. Pt states he will come to our office tomorrow 7/29 around 10 for a DL off his [place].   Will forward to triage as FYI.

## 2020-10-11 NOTE — Telephone Encounter (Signed)
Went to speak with pt letting him know that we were able to obtain a download from his BIPAP machine and let him know that we would have VS review this next week when  he returned to the office. Pt verbalized understanding.  Pt said that he has been having some difficulty trying to get used to the BIPAP machine as it is louder than his cpap resmed machine was as his BIPAP machine is a Luna.  Both pt and his spouse state that  pt still has been experiencing apnea events while wearing the BIPAP machine and his spouse stated that she will shake him to get him to wake up/take a breath again.  Pt said that there have been times when he will wake up to use the bathroom and after returning back to bed, when he tries to put the BIPAP machine back on he cannot get comfortable so he just remains up.  Stated to pt that I would route all of this to VS so he can be able to review along with the download that has been faxed to the [place] office as VS will be there next week.

## 2020-10-11 NOTE — Telephone Encounter (Signed)
Obtained DL from CPAP  Fxed to [place] for Dr Halford Chessman to review 10/14/20  Forwarding to [facility] and Dr Halford Chessman, thanks

## 2020-10-14 NOTE — Telephone Encounter (Signed)
Spoke with the pt  He is aware of results of DL and plan per Dr Halford Chessman  I have sent order to [facility]  He will call if still having problems

## 2020-10-14 NOTE — Telephone Encounter (Signed)
Auto Bipap 09/23/20 to 10/10/20 >> used on 18 of 18 days with average 4 hrs 50 min.  Average AHI 20.8 (CAI 14.7) with mean Bipap 24/10 and 95 th percentile Bipap 25/10 cm H2O   Please have his Bipap changed to Bipap 21/17 cm H2O.  If they continue to notice problems in which he stops breathing at night, then he might need repeat titration study with Bipap using a back up rate and supplemental oxygen.

## 2020-10-16 ENCOUNTER — Encounter: Payer: Self-pay | Admitting: Internal Medicine

## 2020-10-16 ENCOUNTER — Other Ambulatory Visit (INDEPENDENT_AMBULATORY_CARE_PROVIDER_SITE_OTHER): Payer: Medicare Other

## 2020-10-16 ENCOUNTER — Ambulatory Visit (INDEPENDENT_AMBULATORY_CARE_PROVIDER_SITE_OTHER): Payer: Medicare Other | Admitting: Internal Medicine

## 2020-10-16 VITALS — BP 100/64 | HR 70 | Ht 67.0 in | Wt 240.0 lb

## 2020-10-16 DIAGNOSIS — R634 Abnormal weight loss: Secondary | ICD-10-CM

## 2020-10-16 DIAGNOSIS — R1013 Epigastric pain: Secondary | ICD-10-CM

## 2020-10-16 DIAGNOSIS — Z7901 Long term (current) use of anticoagulants: Secondary | ICD-10-CM

## 2020-10-16 DIAGNOSIS — K5900 Constipation, unspecified: Secondary | ICD-10-CM

## 2020-10-16 DIAGNOSIS — R140 Abdominal distension (gaseous): Secondary | ICD-10-CM | POA: Diagnosis not present

## 2020-10-16 DIAGNOSIS — Z8711 Personal history of peptic ulcer disease: Secondary | ICD-10-CM

## 2020-10-16 LAB — BASIC METABOLIC PANEL
BUN: 24 mg/dL — ABNORMAL HIGH (ref 6–23)
CO2: 26 mEq/L (ref 19–32)
Calcium: 9.7 mg/dL (ref 8.4–10.5)
Chloride: 105 mEq/L (ref 96–112)
Creatinine, Ser: 0.94 mg/dL (ref 0.40–1.50)
GFR: 74.09 mL/min (ref >60.00)
Glucose, Bld: 90 mg/dL (ref 70–99)
Potassium: 3.8 mEq/L (ref 3.5–5.1)
Sodium: 138 mEq/L (ref 135–145)

## 2020-10-16 MED ORDER — PANTOPRAZOLE SODIUM 40 MG PO TBEC: 40.0000 mg | DELAYED_RELEASE_TABLET | Freq: Every day | ORAL | 3 refills | Status: DC

## 2020-10-16 NOTE — Progress Notes (Signed)
HISTORY OF PRESENT ILLNESS:  Jacob [name] is a 87 y.o. male with past medical history as listed below.  He has been seen previously in this office for GERD and a history of adenomatous colon polyps.  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  Patient presents today with a chief complaint of upper abdominal pain.  This has been going on for several weeks.  He describes the pain as dull.  No radiation.  No nausea or vomiting.  No active reflux symptoms.  He is no longer on medications for reflux.  Does tell me that after consuming milk products, his upper abdominal discomfort improves.  He also reports significant problems with bloating and gas as manifested by both belching and flatus.  She has had 15 pound weight loss over the past several months.  This is unexplained.  He also describes his stools as mostly constipated.  He has been using MiraLAX on a as needed basis.  This may resulted in loose stools.  Abdominal ultrasound from 2012 revealed possible gallbladder polyp versus sludge.  Last colonoscopy 2012 with diminutive adenomas and left-sided diverticulosis.  He continues on Xarelto.  He did undergo upper endoscopy with Dr. Ardis Hughs in 2021 for GI bleeding.  Was found to have a hemorrhagic gastritis and typical Billroth II anatomy.  Patient denies melena.  He is accompanied today by his wife.  Review of outside blood work from 4 months ago shows creatinine 0.59.  LIVER tests.  Normal hemoglobin 13.2.  REVIEW OF SYSTEMS:  All non-GI ROS negative unless otherwise stated in the HPI except for arthritis  Past Medical History:  Diagnosis Date   Anemia    Arthritis    "middle finger both hands" (09/05/2014)   Basal cell carcinoma    Benign prostatic hypertrophy    hx of   Colon polyp 2006, 2010   TUBULAR ADENOMA   Diverticulosis of colon (without mention of hemorrhage)    First degree atrioventricular block    GERD  (gastroesophageal reflux disease)    Gout    History of blood transfusion 12/1953; 03/1954   Transfused 7 pints blood '55, 4 pints '56   History of duodenal ulcer 1955   w/hemorrhage   Kidney stones    "passed them"   Morbid obesity ([place])    "I was 294; lost 100# 2014-2015"   MVP (mitral valve prolapse)    OSA (obstructive sleep apnea)    "stopped wearing mask after I lost 100#"   Osteoarthritis    Other thalassemia ([place])    Pneumonia "a number of times"   Presence of permanent cardiac pacemaker    Prostate cancer [facility])     Past Surgical History:  Procedure Laterality Date   BIOPSY  10/19/2019   Procedure: BIOPSY;  Surgeon: Milus Banister, MD;  [facility];  Service: Endoscopy;;   CATARACT EXTRACTION W/ INTRAOCULAR LENS  IMPLANT, BILATERAL  2002-2004   CLOSED REDUCTION ANKLE DISLOCATION Right 1980's   "toe my achilles"   COLONOSCOPY  2015   multiple    EP IMPLANTABLE DEVICE N/A 09/05/2014   Procedure: Pacemaker Implant;  Surgeon: [name], MD;  Location: [facility];  Service: Cardiovascular;  Laterality: N/A;   ESOPHAGOGASTRODUODENOSCOPY  2011   multiple   ESOPHAGOGASTRODUODENOSCOPY (EGD) WITH PROPOFOL N/A 10/19/2019   Procedure: ESOPHAGOGASTRODUODENOSCOPY (EGD) WITH PROPOFOL;  Surgeon: Milus Banister, MD;  [facility];  Service: Endoscopy;  Laterality: N/A;   GASTRECTOMY  03/1954   Transfused 7 pints blood '55, 4 pints '56   GASTRECTOMY  1956   partial   HEMOSTASIS CONTROL  10/19/2019   Procedure: HEMOSTASIS CONTROL;  Surgeon: Milus Banister, MD;  [facility];  Service: Endoscopy;;  hemaspray   HOT HEMOSTASIS N/A 10/19/2019   Procedure: HOT HEMOSTASIS (ARGON PLASMA COAGULATION/BICAP);  Surgeon: Milus Banister, MD;  Location: [facility];  Service: Endoscopy;  Laterality: N/A;   INSERT / REPLACE / REMOVE PACEMAKER  09/05/2014   MOLE REMOVAL Left 2008   "forearm"   TUMOR REMOVAL Left 2002   from finger- left index    Social  History Uyless Safron Herrington  reports that he quit smoking about 50 years ago. His smoking use included cigarettes. He started smoking about 50 years ago. He has a 60.00 pack-year smoking history. He has never used smokeless tobacco. He reports that he does not drink alcohol and does not use drugs.  family history includes Cancer in his daughter; Coronary artery disease in his father; Gout in his father; Heart attack in his father; Stomach cancer in his paternal aunt and paternal grandmother.  Allergies  Allergen Reactions   Prednisone     REACTION: GI bleed   Sulfa Antibiotics Rash and Other (See Comments)    "GI impact"       PHYSICAL EXAMINATION: Vital signs: BP 100/64   Pulse 70   Ht '5\' 7"'$  (1.702 m)   Wt 240 lb (108.9 kg)   BMI 37.59 kg/m   Constitutional: Overweight but generally well-appearing, no acute distress Psychiatric: alert and oriented x3, cooperative Eyes: extraocular movements intact, anicteric, conjunctiva pink Mouth: oral pharynx moist, no lesions Neck: supple no lymphadenopathy Cardiovascular: heart regular rate and rhythm, no murmur Lungs: clear to auscultation bilaterally Abdomen: soft, nontender, nondistended, no obvious ascites, no peritoneal signs, normal bowel sounds, no organomegaly.  Prior surgical incisions well-healed Rectal: Omitted Extremities: no clubbing or cyanosis.  Trace lower extremity edema bilaterally Skin: no lesions on visible extremities Neuro: No focal deficits.  Cranial nerves intact  ASSESSMENT:  1.  Several week history of upper abdominal pain.  Rule out acid peptic disorders.  Rule out pancreaticobiliary disorders. 2.  History of ulcer disease status post Billroth II 3.  History of hemorrhagic gastritis 4.  History of adenomatous colon polyps.  Last colonoscopy 2012 5.  Functional constipation 6.  Increased bloating and gas with associated abdominal discomfort 7.  Multiple medical problems.  On chronic anticoagulation 8.  15  pound weight loss  PLAN:  1.  Prescribe pantoprazole 40 mg daily. 2.  Citrucel 2 tablespoons daily for her regular bowel habits 3.  Schedule contrast-enhanced CT scan of the abdomen pelvis to evaluate abdominal pain and weight loss. 4.  Basic metabolic panel 5.  GI office follow-up 6 weeks unless otherwise indicated.  Consider trial of metronidazole if bloating persists. A total time of 45 minutes was spent preparing to see the patient, reviewing test, obtaining comprehensive history, performing medically appropriate physical examination, counseling the patient and his wife regarding the above listed issues, ordering medications and advanced radiology studies as well as blood work, arranging follow-up, and documenting clinical information in the health record

## 2020-10-16 NOTE — Patient Instructions (Addendum)
Your provider has requested that you go to the basement level for lab work before leaving today. Press "B" on the elevator. The lab is located at the first door on the left as you exit the elevator.  We have sent the following medications to your pharmacy for you to pick up at your convenience:  Pantoprazole.  Take Citrucel - 2 tablespoons in water or juice daily   You have been scheduled for a CT scan of the abdomen and pelvis at [facility] ([address]---this is in the same building as [facility]).   You are scheduled on 10/22/2020 at 11:30am. You should arrive 15 minutes prior to your appointment time for registration. Please follow the written instructions below on the day of your exam:  WARNING: IF YOU ARE ALLERGIC TO IODINE/X-RAY DYE, PLEASE NOTIFY RADIOLOGY IMMEDIATELY AT [phone]! YOU WILL BE GIVEN A 13 HOUR PREMEDICATION PREP.  1) Do not eat or drink anything after 7:30am (4 hours prior to your test) 2) You have been given 2 bottles of oral contrast to drink. The solution may taste better if refrigerated, but do NOT add ice or any other liquid to this solution. Shake  well before drinking.    Drink 1 bottle of contrast @ 9:30am (2 hours prior to your exam)  Drink 1 bottle of contrast @ 10:30am (1 hour prior to your exam)  You may take any medications as prescribed with a small amount of water except for the following: Metformin, Glucophage, Glucovance, Avandamet, Riomet, Fortamet, Actoplus Met, Janumet, Glumetza or Metaglip. The above medications must be held the day of the exam AND 48 hours after the exam.  The purpose of you drinking the oral contrast is to aid in the visualization of your intestinal tract. The contrast solution may cause some diarrhea. Before your exam is started, you will be given a small amount of fluid to drink. Depending on your individual set of symptoms, you may also receive an intravenous injection of x-ray contrast/dye. Plan on  being at  HealthCare for 30 minutes or long, depending on the type of exam you are having performed.  If you have any questions regarding your exam or if you need to reschedule, you may call the CT department at [phone] between the hours of 8:00 am and 5:00 pm, Monday-Friday.  Please follow up on_____________________________ 

## 2020-10-22 ENCOUNTER — Other Ambulatory Visit: Payer: Self-pay

## 2020-10-22 ENCOUNTER — Ambulatory Visit (INDEPENDENT_AMBULATORY_CARE_PROVIDER_SITE_OTHER)
Admission: RE | Admit: 2020-10-22 | Discharge: 2020-10-22 | Disposition: A | Discharge status: Home / Self Care | Payer: Medicare Other | Source: Ambulatory Visit | Attending: Internal Medicine | Admitting: Internal Medicine

## 2020-10-22 DIAGNOSIS — R1013 Epigastric pain: Secondary | ICD-10-CM

## 2020-10-22 DIAGNOSIS — R634 Abnormal weight loss: Secondary | ICD-10-CM | POA: Diagnosis not present

## 2020-10-22 DIAGNOSIS — K5900 Constipation, unspecified: Secondary | ICD-10-CM | POA: Diagnosis not present

## 2020-10-22 MED ORDER — IOHEXOL 300 MG/ML  SOLN
100.0000 mL | Freq: Once | INTRAMUSCULAR | Status: AC | PRN
  Administered 2020-10-22: 100 mL via INTRAVENOUS

## 2020-10-31 ENCOUNTER — Ambulatory Visit (INDEPENDENT_AMBULATORY_CARE_PROVIDER_SITE_OTHER): Payer: Medicare Other

## 2020-10-31 DIAGNOSIS — I442 Atrioventricular block, complete: Secondary | ICD-10-CM

## 2020-11-04 LAB — CUP PACEART REMOTE DEVICE CHECK
Battery Remaining Longevity: 21 mo
Battery Voltage: 2.93 V
Brady Statistic RA Percent Paced: 0.09 %
Brady Statistic RV Percent Paced: 97.95 %
Date Time Interrogation Session: 20220819131958
Implantable Lead Implant Date: 20160622
Implantable Lead Implant Date: 20160622
Implantable Lead Location: 753859
Implantable Lead Location: 753860
Implantable Lead Model: 5076
Implantable Lead Model: 5076
Implantable Pulse Generator Implant Date: 20160622
Lead Channel Impedance Value: 323 Ohm
Lead Channel Impedance Value: 342 Ohm
Lead Channel Impedance Value: 437 Ohm
Lead Channel Impedance Value: 456 Ohm
Lead Channel Pacing Threshold Amplitude: 0.625 V
Lead Channel Pacing Threshold Amplitude: 0.875 V
Lead Channel Pacing Threshold Pulse Width: 0.4 ms
Lead Channel Pacing Threshold Pulse Width: 0.4 ms
Lead Channel Sensing Intrinsic Amplitude: 1.375 mV
Lead Channel Sensing Intrinsic Amplitude: 1.375 mV
Lead Channel Sensing Intrinsic Amplitude: 14 mV
Lead Channel Sensing Intrinsic Amplitude: 14 mV
Lead Channel Setting Pacing Amplitude: 2 V
Lead Channel Setting Pacing Amplitude: 2.5 V
Lead Channel Setting Pacing Pulse Width: 0.4 ms
Lead Channel Setting Sensing Sensitivity: 4 mV

## 2020-11-19 NOTE — Progress Notes (Signed)
Remote pacemaker transmission.   

## 2020-12-04 ENCOUNTER — Ambulatory Visit (INDEPENDENT_AMBULATORY_CARE_PROVIDER_SITE_OTHER): Payer: Medicare Other | Admitting: Internal Medicine

## 2020-12-04 ENCOUNTER — Encounter: Payer: Self-pay | Admitting: Internal Medicine

## 2020-12-04 VITALS — BP 110/58 | HR 64 | Ht 66.0 in | Wt 243.0 lb

## 2020-12-04 DIAGNOSIS — R634 Abnormal weight loss: Secondary | ICD-10-CM | POA: Diagnosis not present

## 2020-12-04 DIAGNOSIS — K5900 Constipation, unspecified: Secondary | ICD-10-CM | POA: Diagnosis not present

## 2020-12-04 DIAGNOSIS — R1013 Epigastric pain: Secondary | ICD-10-CM | POA: Diagnosis not present

## 2020-12-04 NOTE — Patient Instructions (Signed)
If you are age 85 or older, your body mass index should be between 23-30. Your Body mass index is 38.74 kg/m. If this is out of the aforementioned range listed, please consider follow up with your Primary Care Provider.  __________________________________________________________  The [facility] providers would like to encourage you to use [facility] to communicate with providers for non-urgent requests or questions.  Due to long hold times on the telephone, sending your provider a message by [facility] may be a faster and more efficient way to get a response.  Please allow 48 business hours for a response.  Please remember that this is for non-urgent requests.   You will follow up with our office on an as needed basis.  Thank you, Dr [name]

## 2020-12-04 NOTE — Progress Notes (Signed)
HISTORY OF PRESENT ILLNESS:  Jacob [name] is a 87 y.o. male with multiple medical problems as listed below who presents today for routine office follow-up.  Jacob [name] was seen October 16, 2020 regarding a several week history of upper abdominal pain, constipation with bloating, and weight loss.  See that dictation for details.  He was prescribed pantoprazole 40 mg daily.  He took this for a while and his abdominal discomfort improved.  He is now using it on demand.  Next, we recommended Citrucel for his irregular bowel habits.  However, he is taking MiraLAX 2 or 3 doses per day.  He states this works well.  Thus, his bowel habits have improved.  We did order a CT scan of the abdomen and pelvis to evaluate his pain and weight loss.  No acute abnormalities.  He is accompanied today by his wife.  Overall, he states that he is feeling well.  No new problems or active complaints.  He is pleased.  Remains active  REVIEW OF SYSTEMS:  All non-GI ROS negative unless otherwise stated in the HPI except for back pain, sleeping problems  Past Medical History:  Diagnosis Date   Anemia    Arthritis    "middle finger both hands" (09/05/2014)   Basal cell carcinoma    Benign prostatic hypertrophy    hx of   Colon polyp 2006, 2010   TUBULAR ADENOMA   Diverticulosis of colon (without mention of hemorrhage)    First degree atrioventricular block    GERD (gastroesophageal reflux disease)    Gout    History of blood transfusion 12/1953; 03/1954   Transfused 7 pints blood '55, 4 pints '56   History of duodenal ulcer 1955   w/hemorrhage   Kidney stones    "passed them"   Morbid obesity ([place])    "I was 294; lost 100# 2014-2015"   MVP (mitral valve prolapse)    OSA (obstructive sleep apnea)    "stopped wearing mask after I lost 100#"   Osteoarthritis    Other thalassemia ([place])    Pneumonia "a number of times"   Presence of permanent cardiac pacemaker    Prostate cancer [facility])     Past Surgical History:   Procedure Laterality Date   BIOPSY  10/19/2019   Procedure: BIOPSY;  Surgeon: Milus Banister, MD;  Location: [facility];  Service: Endoscopy;;   CATARACT EXTRACTION W/ INTRAOCULAR LENS  IMPLANT, BILATERAL  2002-2004   CLOSED REDUCTION ANKLE DISLOCATION Right 1980's   "toe my achilles"   COLONOSCOPY  2015   multiple    EP IMPLANTABLE DEVICE N/A 09/05/2014   Procedure: Pacemaker Implant;  Surgeon: [name], MD;  Location: [facility];  Service: Cardiovascular;  Laterality: N/A;   ESOPHAGOGASTRODUODENOSCOPY  2011   multiple   ESOPHAGOGASTRODUODENOSCOPY (EGD) WITH PROPOFOL N/A 10/19/2019   Procedure: ESOPHAGOGASTRODUODENOSCOPY (EGD) WITH PROPOFOL;  Surgeon: Milus Banister, MD;  Location: [facility];  Service: Endoscopy;  Laterality: N/A;   GASTRECTOMY  03/1954   Transfused 7 pints blood '55, 4 pints '56   GASTRECTOMY  1956   partial   HEMOSTASIS CONTROL  10/19/2019   Procedure: HEMOSTASIS CONTROL;  Surgeon: Milus Banister, MD;  Location: [facility];  Service: Endoscopy;;  hemaspray   HOT HEMOSTASIS N/A 10/19/2019   Procedure: HOT HEMOSTASIS (ARGON PLASMA COAGULATION/BICAP);  Surgeon: Milus Banister, MD;  Location: [facility];  Service: Endoscopy;  Laterality: N/A;   INSERT / REPLACE / REMOVE PACEMAKER  09/05/2014  MOLE REMOVAL Left 2008   "forearm"   TUMOR REMOVAL Left 2002   from finger- left index    Social History Jacob [name]  reports that he quit smoking about 50 years ago. His smoking use included cigarettes. He started smoking about 50 years ago. He has a 60.00 pack-year smoking history. He has never used smokeless tobacco. He reports that he does not drink alcohol and does not use drugs.  family history includes Cancer in his daughter; Coronary artery disease in his father; Gout in his father; Heart attack in his father; Stomach cancer in his paternal aunt and paternal grandmother.  Allergies  Allergen Reactions   Prednisone     REACTION: GI bleed    Sulfa Antibiotics Rash and Other (See Comments)    "GI impact"       PHYSICAL EXAMINATION: Vital signs: BP (!) 110/58   Pulse 64   Ht 5\' 6"  (1.676 m) Comment: height without shoes  BMI 38.74 kg/m   Constitutional: Overweight but otherwise generally well-appearing for his age, no acute distress Psychiatric: alert and oriented x3, cooperative Eyes: extraocular movements intact, anicteric, conjunctiva pink Mouth: oral pharynx moist, no lesions Neck: supple no lymphadenopathy Cardiovascular: heart regular rate and rhythm, no murmur Lungs: clear to auscultation bilaterally Abdomen: soft, nontender, nondistended, no obvious ascites, no peritoneal signs, normal bowel sounds, no organomegaly Rectal: Omitted Extremities: no clubbing or cyanosis.  1+ lower extremity edema bilaterally Skin: no lesions on visible extremities Neuro: No focal deficits.  Cranial nerves intact  ASSESSMENT:  1.  Problems with epigastric pain.  Resolved with course of PPI. 2.  History of ulcer disease status post Billroth II 3.  Weight loss.  Stable since last visit. 4.  Negative CT 5.  Irregular bowel habits.  Managed with MiraLAX 6.  Multiple medical problems   PLAN:  1.  Continue with PPI 2.  Continue with MiraLAX 3.  GI follow-up as needed

## 2020-12-11 ENCOUNTER — Other Ambulatory Visit: Payer: Self-pay | Admitting: Cardiology

## 2020-12-11 NOTE — Telephone Encounter (Signed)
Prescription refill request for Xarelto received.  Indication:afib Last office visit:taylor 09/10/20 Weight:110.2kg Age:18m Scr:0.94 10/16/20 CrCl:89.6

## 2020-12-16 ENCOUNTER — Other Ambulatory Visit: Payer: Self-pay | Admitting: Internal Medicine

## 2020-12-16 DIAGNOSIS — D568 Other thalassemias: Secondary | ICD-10-CM

## 2020-12-17 ENCOUNTER — Encounter: Payer: Self-pay | Admitting: Internal Medicine

## 2021-01-13 ENCOUNTER — Other Ambulatory Visit (HOSPITAL_COMMUNITY): Payer: Self-pay | Admitting: Urology

## 2021-01-13 DIAGNOSIS — C61 Malignant neoplasm of prostate: Secondary | ICD-10-CM

## 2021-01-23 ENCOUNTER — Encounter (HOSPITAL_COMMUNITY)
Admission: RE | Admit: 2021-01-23 | Discharge: 2021-01-23 | Disposition: A | Discharge status: Home / Self Care | Payer: Medicare Other | Source: Ambulatory Visit | Attending: Urology | Admitting: Urology

## 2021-01-23 DIAGNOSIS — C61 Malignant neoplasm of prostate: Secondary | ICD-10-CM | POA: Insufficient documentation

## 2021-01-23 MED ORDER — PIFLIFOLASTAT F 18 (PYLARIFY) INJECTION
9.0000 | Freq: Once | INTRAVENOUS | Status: AC
  Administered 2021-01-23: 9.6 via INTRAVENOUS

## 2021-01-30 ENCOUNTER — Ambulatory Visit (INDEPENDENT_AMBULATORY_CARE_PROVIDER_SITE_OTHER): Payer: Medicare Other

## 2021-01-30 DIAGNOSIS — I442 Atrioventricular block, complete: Secondary | ICD-10-CM | POA: Diagnosis not present

## 2021-01-31 LAB — CUP PACEART REMOTE DEVICE CHECK
Battery Remaining Longevity: 17 mo
Battery Voltage: 2.91 V
Brady Statistic RA Percent Paced: 0.08 %
Brady Statistic RV Percent Paced: 97.29 %
Date Time Interrogation Session: 20221117162546
Implantable Lead Implant Date: 20160622
Implantable Lead Implant Date: 20160622
Implantable Lead Location: 753859
Implantable Lead Location: 753860
Implantable Lead Model: 5076
Implantable Lead Model: 5076
Implantable Pulse Generator Implant Date: 20160622
Lead Channel Impedance Value: 323 Ohm
Lead Channel Impedance Value: 361 Ohm
Lead Channel Impedance Value: 437 Ohm
Lead Channel Impedance Value: 456 Ohm
Lead Channel Pacing Threshold Amplitude: 0.625 V
Lead Channel Pacing Threshold Amplitude: 0.875 V
Lead Channel Pacing Threshold Pulse Width: 0.4 ms
Lead Channel Pacing Threshold Pulse Width: 0.4 ms
Lead Channel Sensing Intrinsic Amplitude: 1.5 mV
Lead Channel Sensing Intrinsic Amplitude: 1.5 mV
Lead Channel Sensing Intrinsic Amplitude: 10.875 mV
Lead Channel Sensing Intrinsic Amplitude: 10.875 mV
Lead Channel Setting Pacing Amplitude: 2 V
Lead Channel Setting Pacing Amplitude: 2.5 V
Lead Channel Setting Pacing Pulse Width: 0.4 ms
Lead Channel Setting Sensing Sensitivity: 4 mV

## 2021-02-05 ENCOUNTER — Telehealth: Payer: Self-pay

## 2021-02-05 DIAGNOSIS — I700 Atherosclerosis of aorta: Secondary | ICD-10-CM | POA: Insufficient documentation

## 2021-02-05 NOTE — Telephone Encounter (Signed)
Patient with diagnosis of afib on Xarelto for anticoagulation.    Procedure: prostate biopsy Date of procedure: 03/20/21  CHA2DS2-VASc Score = 4  This indicates a 4.8% annual risk of stroke. The patient's score is based upon: CHF History: 0 HTN History: 1 Diabetes History: 0 Stroke History: 0 Vascular Disease History: 1 Age Score: 2 Gender Score: 0   CrCl 46mL/min using adjusted body weight Platelet count 198K  Per office protocol, patient can hold Xarelto for 2-3 days prior to procedure.

## 2021-02-05 NOTE — Telephone Encounter (Signed)
Pharmacy, can you please comment on how long Xarelto can be held for upcoming procedure?  Thank you! 

## 2021-02-05 NOTE — Telephone Encounter (Signed)
   [facility] Pre-operative Risk Assessment    Patient Name: Jacob [name]  DOB: 13-Jun-1933 MRN: [id number]    Request for surgical clearance:  What type of surgery is being performed Prostate biopsy  When is this surgery scheduled 03/20/21  What type of clearance is required  Both  Are there any medications that need to be held prior to surgery and how long  Xarelto  Practice name and name of physician performing surgery  [facility]   Dr.Benjamin Louis Meckel  What is the office phone number   [phone]   7.   What is the office fax number  [phone]  8.   Anesthesia type   Not listed   Kathyrn Lass 02/05/2021, 1:00 PM  _________________________________________________________________   (provider comments below)

## 2021-02-05 NOTE — Telephone Encounter (Signed)
    Patient Name: Jacob [name]  DOB: 05/07/1933 MRN: [id number]  Primary Cardiologist: [name], MD  Chart reviewed as part of pre-operative protocol coverage. Patient was last seen by Dr. Lovena Le in 08/2020. He was contacted today for further pre-op evaluation and reported doing well since last visit. He denies any chest pain, shortness of breath, orthopnea/PND, palpitations, syncope. He is very active and easily is able to complete >4.0 METS of activity without any problems (he exercises for about 1 hour 5 times per week). Given past medical history and time since last visit, based on ACC/AHA guidelines, Jacob [name] would be at acceptable risk for the planned procedure without further cardiovascular testing.   Per Pharmacy and office protocol, patient can hold Xarelto for 2-3 days prior to procedure. Please restart this as soon as safely possible following biopsy.   I will route this recommendation to the requesting party via Epic fax function and remove from pre-op pool.  Please call with questions.  Darreld Mclean, PA-C 02/05/2021, 2:34 PM

## 2021-02-10 NOTE — Progress Notes (Signed)
Remote pacemaker transmission.   

## 2021-03-27 ENCOUNTER — Other Ambulatory Visit: Payer: Self-pay | Admitting: Internal Medicine

## 2021-03-27 DIAGNOSIS — D568 Other thalassemias: Secondary | ICD-10-CM

## 2021-03-31 ENCOUNTER — Encounter: Payer: Self-pay | Admitting: Medical Oncology

## 2021-03-31 ENCOUNTER — Other Ambulatory Visit: Payer: Self-pay | Admitting: Medical Oncology

## 2021-03-31 NOTE — Telephone Encounter (Signed)
Requests refill for integra. Done per Dr Julien Nordmann on 03/27/21.  I emailed pt that Integra rx was sent 01/12 and received by his pharmacy.

## 2021-04-03 ENCOUNTER — Telehealth: Payer: Self-pay

## 2021-04-03 NOTE — Telephone Encounter (Signed)
Pt called stating the cost of his rx of Integra Plus increased significantly (about $150 for a 90 day supply).  I have assisted the pt with how to review and obtain GoodRx coupons. While on the phone with him, he was able to locate the [facility] coupon for $10.38. Pt has this information and will contact [facility] to have the coupon applied for his refill.  BIN [id number] PCN [facility] [id number] Member ID [id number]

## 2021-04-23 NOTE — Progress Notes (Signed)
GU Location of Tumor / Histology: Prostate Ca  If Prostate Cancer, Gleason Score is (4 + 3) and PSA is (24.0 as of 12/2020)  Biopsies  Dr. Louis Meckel    Past/Anticipated interventions by urology, if any:   Past/Anticipated interventions by medical oncology, if any:   Weight changes, if any:  No  IPSS:   9 SHIM:  5  Bowel/Bladder complaints, if any:  Constipation is taking stool softener and Miralax.  Urinary urgency and frequency.  Nausea/Vomiting, if any: No  Pain issues, if any:  0/10  SAFETY ISSUES: Prior radiation?  No Pacemaker/ICD?  MDT dual chamber PPM on 09/05/14 by Dr Lovena Le. Possible current pregnancy?  Male Is the patient on methotrexate?   No  Current Complaints / other details:

## 2021-04-25 ENCOUNTER — Ambulatory Visit
Admission: RE | Admit: 2021-04-25 | Discharge: 2021-04-25 | Disposition: A | Discharge status: Home / Self Care | Payer: Medicare Other | Source: Ambulatory Visit | Attending: Radiation Oncology | Admitting: Radiation Oncology

## 2021-04-25 ENCOUNTER — Encounter: Payer: Self-pay | Admitting: Internal Medicine

## 2021-04-25 ENCOUNTER — Encounter: Payer: Self-pay | Admitting: Urology

## 2021-04-25 ENCOUNTER — Other Ambulatory Visit: Payer: Self-pay

## 2021-04-25 VITALS — BP 122/65 | HR 83 | Temp 97.8°F | Resp 20 | Ht 67.0 in | Wt 247.4 lb

## 2021-04-25 DIAGNOSIS — Z79899 Other long term (current) drug therapy: Secondary | ICD-10-CM | POA: Insufficient documentation

## 2021-04-25 DIAGNOSIS — Z86010 Personal history of colonic polyps: Secondary | ICD-10-CM | POA: Diagnosis not present

## 2021-04-25 DIAGNOSIS — Z85828 Personal history of other malignant neoplasm of skin: Secondary | ICD-10-CM | POA: Diagnosis not present

## 2021-04-25 DIAGNOSIS — Z87891 Personal history of nicotine dependence: Secondary | ICD-10-CM | POA: Diagnosis not present

## 2021-04-25 DIAGNOSIS — K219 Gastro-esophageal reflux disease without esophagitis: Secondary | ICD-10-CM | POA: Insufficient documentation

## 2021-04-25 DIAGNOSIS — M109 Gout, unspecified: Secondary | ICD-10-CM | POA: Diagnosis not present

## 2021-04-25 DIAGNOSIS — Z7901 Long term (current) use of anticoagulants: Secondary | ICD-10-CM | POA: Diagnosis not present

## 2021-04-25 DIAGNOSIS — I341 Nonrheumatic mitral (valve) prolapse: Secondary | ICD-10-CM | POA: Diagnosis not present

## 2021-04-25 DIAGNOSIS — M1990 Unspecified osteoarthritis, unspecified site: Secondary | ICD-10-CM | POA: Insufficient documentation

## 2021-04-25 DIAGNOSIS — G4730 Sleep apnea, unspecified: Secondary | ICD-10-CM | POA: Diagnosis not present

## 2021-04-25 DIAGNOSIS — D649 Anemia, unspecified: Secondary | ICD-10-CM | POA: Diagnosis not present

## 2021-04-25 DIAGNOSIS — C61 Malignant neoplasm of prostate: Secondary | ICD-10-CM | POA: Insufficient documentation

## 2021-04-25 DIAGNOSIS — M129 Arthropathy, unspecified: Secondary | ICD-10-CM | POA: Diagnosis not present

## 2021-04-25 NOTE — Progress Notes (Signed)
TO BE COMPLETED BY RADIATION ONCOLOGIST OFFICE:   Patient Name: Jacob [name]   Date of Birth: 1933/05/24  Sharol Given, RN  P [facility] Device; P [facility] Rad Onc Admin TO BE COMPLETED BY RADIATION ONCOLOGIST OFFICE:   Patient Name: [name]   Date of Birth: Oct 24, 1933   Radiation Oncologist: Dr. Tyler Pita   Site to be Treated: Prostate   Will x-rays >10 MV be used? No   Will the radiation be >10 cm from the device? Yes   Planned Treatment Start Date: 1 to 2 weeks   TO BE COMPLETED BY CARDIOLOGIST OFFICE:   Device Information:  Pacemaker [x]      ICD []    Brand: Medtronic: [phone] Model #: A2DR01 Advisa DR MRI  Serial Number: [id number]     Date of Placement: 09/05/2014  Site of Placement: left pectoral  Remote Device Check--Frequency: Q24months   Last Check: 01/30/21 (remote); 09/10/20 (in-clinic)  Is the Patient Pacer Dependent?:  Yes [x]   No []   Does cardiologist request Radiation Oncology to schedule device testing by vendor for the following:  Prior to the Initiation of Treatments?  Yes []  No [x]  During Treatments?  Yes []  No [x]  Post Radiation Treatments?  Yes []  No [x]   Is device monitoring necessary by vendor/cardiologist team during treatments?  Yes []   No [x]   Is cardiac monitoring by Radiation Oncology nursing necessary during treatments? Yes []   No [x]   Do you recommend device be relocated prior to Radiation Treatment? Yes []   No [x]   **PLEASE LIST ANY NOTES OR SPECIAL REQUESTS:       CARDIOLOGIST SIGNATURE:  Dr. Cristopher Peru Per [facility] Standing Orders, York Ram  04/25/2021 11:31 AM  **Please route completed form back to Radiation Oncology Nursing and "[place]", OR send an update if there will be a delay in having form completed by expected start date.  **Call [phone] if you have any questions or do not get an in-basket response from a Radiation Oncology staff member

## 2021-04-25 NOTE — Progress Notes (Signed)
Radiation Oncology         [phone] ________________________________  Initial Outpatient Consultation  Name: Jacob [name] MRN: [id number]  Date: 04/25/2021  DOB: November 04, 1933  RJ:JOACZYS, [name], MD  Ardis Hughs, MD   REFERRING PHYSICIAN: Ardis Hughs, MD  DIAGNOSIS: 88 y.o. gentleman with Stage T1c adenocarcinoma of the prostate with Gleason score of 4+4, and PSA of 24.    ICD-10-CM   1. Malignant neoplasm of prostate ([place])  C61       HISTORY OF PRESENT ILLNESS: Jacob [name] is a 88 y.o. male with a diagnosis of prostate cancer. He has been followed by urology since at least 2005 for an elevated PSA. Prostate biopsy at that time was benign. Repeat biopsy on 09/12/13 by Dr. Gaynelle Arabian for PSA of 4.61 showed one small area of Gleason 3+3 prostate cancer. He has been on active surveillance since that time.  His PSA has continued to rise over the years but he previously declined repeat biopsy due to no interest in aggressive treatment given his age so he just continued in surveillance under the care of Dr. Louis Meckel. However, his PSA reached 24 on 01/07/21. Prior CT AP on 10/22/20 for epigastric pain was negative, showing only prostatomegaly and diverticulosis. He underwent PSMA PET scan on 01/23/21 showing four foci of intense radiotracer activity within prostate gland but no evidence of metastatic adenopathy, visceral metastasis, or skeletal metastasis.  He agree to a repeat transrectal ultrasound with 12 biopsies of the prostate which was performed on 03/20/21.  The prostate volume measured 41 cc.  Out of 12 core biopsies, 8 were positive.  The maximum Gleason score was 4+4, and this was seen in the right base lateral, right base, and left apex. Additionally, Gleason 4+3 was seen in the left base lateral (with perineural invasion), and Gleason 3+4 in the right apex, right mid, left mid, and left mid lateral.  The patient reviewed the biopsy results with his  urologist and he has kindly been referred today for discussion of potential radiation treatment options.   PREVIOUS RADIATION THERAPY: No  PAST MEDICAL HISTORY:  Past Medical History:  Diagnosis Date   Anemia    Arthritis    "middle finger both hands" (09/05/2014)   Basal cell carcinoma    Benign prostatic hypertrophy    hx of   Colon polyp 2006, 2010   TUBULAR ADENOMA   Diverticulosis of colon (without mention of hemorrhage)    First degree atrioventricular block    GERD (gastroesophageal reflux disease)    Gout    History of blood transfusion 12/1953; 03/1954   Transfused 7 pints blood '55, 4 pints '56   History of duodenal ulcer 1955   w/hemorrhage   Kidney stones    "passed them"   Morbid obesity ([place])    "I was 294; lost 100# 2014-2015"   MVP (mitral valve prolapse)    OSA (obstructive sleep apnea)    "stopped wearing mask after I lost 100#"   Osteoarthritis    Other thalassemia ([place])    Pneumonia "a number of times"   Presence of permanent cardiac pacemaker    Prostate cancer [facility])       PAST SURGICAL HISTORY: Past Surgical History:  Procedure Laterality Date   BIOPSY  10/19/2019   Procedure: BIOPSY;  Surgeon: Milus Banister, MD;  [facility];  Service: Endoscopy;;   CATARACT EXTRACTION W/ INTRAOCULAR LENS  IMPLANT, BILATERAL  2002-2004   CLOSED REDUCTION ANKLE DISLOCATION  Right 1980's   "toe my achilles"   COLONOSCOPY  2015   multiple    EP IMPLANTABLE DEVICE N/A 09/05/2014   Procedure: Pacemaker Implant;  Surgeon: [name], MD;  Location: [place] [facility];  Service: Cardiovascular;  Laterality: N/A;   ESOPHAGOGASTRODUODENOSCOPY  2011   multiple   ESOPHAGOGASTRODUODENOSCOPY (EGD) WITH PROPOFOL N/A 10/19/2019   Procedure: ESOPHAGOGASTRODUODENOSCOPY (EGD) WITH PROPOFOL;  Surgeon: Milus Banister, MD;  [facility];  Service: Endoscopy;  Laterality: N/A;   GASTRECTOMY  03/1954   Transfused 7 pints blood '55, 4 pints '56   GASTRECTOMY   1956   partial   HEMOSTASIS CONTROL  10/19/2019   Procedure: HEMOSTASIS CONTROL;  Surgeon: Milus Banister, MD;  [facility];  Service: Endoscopy;;  hemaspray   HOT HEMOSTASIS N/A 10/19/2019   Procedure: HOT HEMOSTASIS (ARGON PLASMA COAGULATION/BICAP);  Surgeon: Milus Banister, MD;  Location: [facility];  Service: Endoscopy;  Laterality: N/A;   INSERT / REPLACE / REMOVE PACEMAKER  09/05/2014   MOLE REMOVAL Left 2008   "forearm"   TUMOR REMOVAL Left 2002   from finger- left index    FAMILY HISTORY:  Family History  Problem Relation Age of Onset   Coronary artery disease Father    Heart attack Father    Gout Father    Stomach cancer Paternal Grandmother    Cancer Daughter        skin   Stomach cancer Paternal Aunt    Colon cancer Neg Hx    Esophageal cancer Neg Hx    Pancreatic cancer Neg Hx     SOCIAL HISTORY:  Social History   Socioeconomic History   Marital status: Married    Spouse name: ANn   Number of children: 8   Years of education: 18+   Highest education level: Not on file  Occupational History   Occupation: executive    Comment: retired  Tobacco Use   Smoking status: Former    Packs/day: 3.00    Years: 20.00    Pack years: 60.00    Types: Cigarettes    Start date: 01/21/1970    Quit date: 02/1970    Years since quitting: 51.2   Smokeless tobacco: Never   Tobacco comments:    quite smoking cigarettes in [place] Use   Vaping Use: Never used  Substance and Sexual Activity   Alcohol use: No    Comment: "no alcohol since 1987"   Drug use: No   Sexual activity: Yes    Partners: Female  Other Topics Concern   Not on file  Social History Narrative   [employer]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 Married '59. Eight Daughters '60, '62, '66, '70, '73, '74, '78, '83; 1 grandchildren 1great grandchild. Lives independently with wife. ACP- asked pt to think about these issues (DEc '12)      Social Determinants of Radio broadcast assistant  Strain: Not on file  Food Insecurity: Not on file  Transportation Needs: Not on file  Physical Activity: Not on file  Stress: Not on file  Social Connections: Not on file  Intimate Partner Violence: Not on file    ALLERGIES: Prednisone and Sulfa antibiotics  MEDICATIONS:  Current Outpatient Medications  Medication Sig Dispense Refill   allopurinol (ZYLOPRIM) 300 MG tablet Take 1 tablet (300 mg total) by mouth daily. 90 tablet 3   B Complex-C (SUPER B COMPLEX PO) Take 1 mg by mouth once.     Cholecalciferol (  VITAMIN D3) 2000 UNITS capsule Take 1 capsule (2,000 Units total) by mouth daily.     Cyanocobalamin (VITAMIN B 12 PO) Take 1,000 mg by mouth daily.     FeFum-FePoly-FA-B Cmp-C-Biot (INTEGRA PLUS) CAPS TAKE 1 CAPSULE BY MOUTH EVERY MORNING 90 capsule 0   furosemide (LASIX) 40 MG tablet Take 40 mg by mouth.     Magnesium 200 MG TABS Take 1 mg by mouth daily.     Melatonin 10 MG CAPS Take 10 mg by mouth daily.     Omega-3 Fatty Acids (FISH OIL) 600 MG CAPS Take 600 mg by mouth daily.     pantoprazole (PROTONIX) 40 MG tablet Take 1 tablet (40 mg total) by mouth daily. 90 tablet 3   Polyethylene Glycol 3350 (MIRALAX PO) Take 1 mg by mouth as needed.     Potassium 95 MG TABS Take 1 mg by mouth daily.     tamsulosin (FLOMAX) 0.4 MG CAPS capsule Take 0.4 mg by mouth daily.   11   XARELTO 20 MG TABS tablet TAKE 1 TABLET(20 MG) BY MOUTH DAILY WITH SUPPER 90 tablet 1   b complex vitamins capsule Take 1 capsule by mouth daily.     loratadine (CLARITIN) 10 MG tablet Take 10 mg by mouth as needed.     No current facility-administered medications for this encounter.    REVIEW OF SYSTEMS:  On review of systems, the patient reports that he is doing well overall. He denies any chest pain, shortness of breath, cough, fevers, chills, night sweats, unintended weight changes. He denies any bowel disturbances, and denies abdominal pain, nausea or vomiting. He denies any new musculoskeletal or joint  aches or pains. His IPSS was 9, indicating mild urinary symptoms. He specifically reports urinary urgency and frequency. His SHIM was 5, indicating he has severe erectile dysfunction. A complete review of systems is obtained and is otherwise negative.    PHYSICAL EXAM:  Wt Readings from Last 3 Encounters:  05/05/21 251 lb 6.4 oz (114 kg)  04/25/21 247 lb 6.4 oz (112.2 kg)  12/04/20 243 lb (110.2 kg)   Temp Readings from Last 3 Encounters:  04/25/21 97.8 F (36.6 C)  06/06/20 (!) 97.3 F (36.3 C) (Temporal)  10/20/19 98.6 F (37 C)   BP Readings from Last 3 Encounters:  05/05/21 118/68  04/25/21 122/65  12/04/20 (!) 110/58   Pulse Readings from Last 3 Encounters:  05/05/21 68  04/25/21 83  12/04/20 64   Pain Assessment Pain Score: 0-No pain/10  In general this is a well appearing Caucasian male in no acute distress. He's alert and oriented x4 and appropriate throughout the examination. Cardiopulmonary assessment is negative for acute distress, and he exhibits normal effort.     KPS = 90  100 - Normal; no complaints; no evidence of disease. 90   - Able to carry on normal activity; minor signs or symptoms of disease. 80   - Normal activity with effort; some signs or symptoms of disease. 89   - Cares for self; unable to carry on normal activity or to do active work. 60   - Requires occasional assistance, but is able to care for most of his personal needs. 50   - Requires considerable assistance and frequent medical care. 59   - Disabled; requires special care and assistance. 45   - Severely disabled; hospital admission is indicated although death not imminent. 23   - Very sick; hospital admission necessary; active supportive treatment necessary. 10   -  Moribund; fatal processes progressing rapidly. 0     - Dead  Karnofsky DA, Abelmann Davis, Craver LS and Burchenal JH [phone]) The use of the nitrogen mustards in the palliative treatment of carcinoma: with particular reference to  bronchogenic carcinoma Cancer 1 634-56  LABORATORY DATA:  Lab Results  Component Value Date   WBC 6.2 05/02/2020   HGB 12.8 (L) 05/02/2020   HCT 42.5 05/02/2020   MCV 66 (L) 05/02/2020   PLT 198 05/02/2020   Lab Results  Component Value Date   NA 138 10/16/2020   K 3.8 10/16/2020   CL 105 10/16/2020   CO2 26 10/16/2020   Lab Results  Component Value Date   ALT 14 10/19/2019   AST 17 10/19/2019   ALKPHOS 58 10/19/2019   BILITOT 1.6 (H) 10/19/2019     RADIOGRAPHY: CUP PACEART REMOTE DEVICE CHECK  Result Date: 05/02/2021 Scheduled remote reviewed. Normal device function.  Known permanent AF with CVR. Programmed DDIR. +OAC per prior report. Next remote 91 days. LH     IMPRESSION/PLAN: 1. 88 y.o. gentleman with Stage T1c adenocarcinoma of the prostate with Gleason Score of 4+4, and PSA of 24. We discussed the patient's workup and outlined the nature of prostate cancer in this setting. The patient's T stage, Gleason's score, and PSA put him into the high risk group. Accordingly, he is eligible for a variety of potential treatment options including LT-ADT in combination with either 8 weeks of external radiation or 5 weeks of external radiation with an upfront brachytherapy boost. We discussed the available radiation techniques, and focused on the details and logistics of delivery. Given his advanced age, he is not felt to be an ideal candidate for surgery. Therefore, we discussed and outlined the risks, benefits, short and long-term effects associated with radiotherapy. We discussed the role of SpaceOAR gel in reducing the rectal toxicity associated with radiotherapy. We also detailed the role of ADT in the treatment of high risk prostate cancer and outlined the associated side effects that could be expected with this therapy.  He has already started ADT with a 6 month Eligard injection on 04/15/21.  He was encouraged to ask questions that were answered to his stated satisfaction.  At the  conclusion of our conversation, the patient is interested in moving forward with 8 weeks of external beam therapy in combination with ADT. He has received his first Lupron injection on 04/15/21 so we will share our discussion with Dr. Louis Meckel and make arrangements for fiducial markers and SpaceOAR gel placement, first available, prior to simulation, to reduce rectal toxicity from radiotherapy. The patient appears to have a good understanding of his disease and our treatment recommendations which are of curative intent and is in agreement with the stated plan.  Therefore, we will move forward with treatment planning accordingly, in anticipation of beginning IMRT approximately 2 months after starting ADT.  We personally spent 70 minutes in this encounter including chart review, reviewing radiological studies, meeting face-to-face with the patient, entering orders and completing documentation.    Nicholos Johns, PA-C    Tyler Pita, MD  [facility] Direct Dial: [phone]   Fax: [phone] [email or web address]   Skype   LinkedIn   This document serves as a record of services personally performed by Tyler Pita, MD and Freeman Caldron, PA-C. It was created on their behalf by Wilburn Mylar, a trained medical scribe. The creation of this record is based on the scribe's personal observations and the  provider's statements to them. This document has been checked and approved by the attending provider.

## 2021-04-29 NOTE — Progress Notes (Signed)
Introduced myself to the patient, and his wife, as the prostate nurse navigator.  No barriers to care identified at this time.  He is here to discuss his radiation treatment options.  I gave him my business card and asked him to call me with questions or concerns.  Verbalized understanding.  

## 2021-04-29 NOTE — Progress Notes (Signed)
Patient called to inquire about option for CyberKnife therapy.  RN provided education on therapy based off MD recommendations.  Referral to [facility] offered if he wishes to have further discussion regarding CyberKnife.  Patient declines at this time.  All questions answered, encouraged patient to reach out for any additional questions.

## 2021-05-01 ENCOUNTER — Ambulatory Visit (INDEPENDENT_AMBULATORY_CARE_PROVIDER_SITE_OTHER): Payer: Medicare Other

## 2021-05-01 DIAGNOSIS — I442 Atrioventricular block, complete: Secondary | ICD-10-CM

## 2021-05-02 LAB — CUP PACEART REMOTE DEVICE CHECK
Battery Remaining Longevity: 14 mo
Battery Voltage: 2.9 V
Brady Statistic RA Percent Paced: 0.09 %
Brady Statistic RV Percent Paced: 97.22 %
Date Time Interrogation Session: 20230217112947
Implantable Lead Implant Date: 20160622
Implantable Lead Implant Date: 20160622
Implantable Lead Location: 753859
Implantable Lead Location: 753860
Implantable Lead Model: 5076
Implantable Lead Model: 5076
Implantable Pulse Generator Implant Date: 20160622
Lead Channel Impedance Value: 304 Ohm
Lead Channel Impedance Value: 342 Ohm
Lead Channel Impedance Value: 437 Ohm
Lead Channel Impedance Value: 437 Ohm
Lead Channel Pacing Threshold Amplitude: 0.625 V
Lead Channel Pacing Threshold Amplitude: 0.75 V
Lead Channel Pacing Threshold Pulse Width: 0.4 ms
Lead Channel Pacing Threshold Pulse Width: 0.4 ms
Lead Channel Sensing Intrinsic Amplitude: 1.5 mV
Lead Channel Sensing Intrinsic Amplitude: 1.5 mV
Lead Channel Sensing Intrinsic Amplitude: 7.375 mV
Lead Channel Sensing Intrinsic Amplitude: 7.375 mV
Lead Channel Setting Pacing Amplitude: 2 V
Lead Channel Setting Pacing Amplitude: 2.5 V
Lead Channel Setting Pacing Pulse Width: 0.4 ms
Lead Channel Setting Sensing Sensitivity: 4 mV

## 2021-05-03 DIAGNOSIS — I420 Dilated cardiomyopathy: Secondary | ICD-10-CM | POA: Insufficient documentation

## 2021-05-03 NOTE — Progress Notes (Signed)
Cardiology Office Note   Date:  05/05/2021   ID:  Jacob [name], DOB [birth year], MRN [id number]  PCP:  [name], MD  Cardiologist:   [name], MD   Chief Complaint  Patient presents with   Atrial Fibrillation      History of Present Illness: Jacob [name] is a 88 y.o. male who presents  for followup of his slow heart rate, fib/flutter.  He is status post pacemaker placement.  Since I last saw him he has been diagnosed with prostate cancer.    From a cardiac standpoint he is actually done well.  He still does though rowing machine, treadmill and bicycle and walks at the [facility]. The patient denies any new symptoms such as chest discomfort, neck or arm discomfort. There has been no new shortness of breath, PND or orthopnea. There have been no reported palpitations, presyncope or syncope.    Past Medical History:  Diagnosis Date   Anemia    Arthritis    "middle finger both hands" (09/05/2014)   Basal cell carcinoma    Benign prostatic hypertrophy    hx of   Colon polyp 2006, 2010   TUBULAR ADENOMA   Diverticulosis of colon (without mention of hemorrhage)    First degree atrioventricular block    GERD (gastroesophageal reflux disease)    Gout    History of blood transfusion 12/1953; 03/1954   Transfused 7 pints blood '55, 4 pints '56   History of duodenal ulcer 1955   w/hemorrhage   Kidney stones    "passed them"   Morbid obesity ([place])    "I was 294; lost 100# 2014-2015"   MVP (mitral valve prolapse)    OSA (obstructive sleep apnea)    "stopped wearing mask after I lost 100#"   Osteoarthritis    Other thalassemia ([place])    Pneumonia "a number of times"   Presence of permanent cardiac pacemaker    Prostate cancer [facility])     Past Surgical History:  Procedure Laterality Date   BIOPSY  10/19/2019   Procedure: BIOPSY;  Surgeon: Milus Banister, MD;  [facility];  Service: Endoscopy;;   CATARACT EXTRACTION W/ INTRAOCULAR LENS   IMPLANT, BILATERAL  2002-2004   CLOSED REDUCTION ANKLE DISLOCATION Right 1980's   "toe my achilles"   COLONOSCOPY  2015   multiple    EP IMPLANTABLE DEVICE N/A 09/05/2014   Procedure: Pacemaker Implant;  Surgeon: [name], MD;  Location: [place] [facility];  Service: Cardiovascular;  Laterality: N/A;   ESOPHAGOGASTRODUODENOSCOPY  2011   multiple   ESOPHAGOGASTRODUODENOSCOPY (EGD) WITH PROPOFOL N/A 10/19/2019   Procedure: ESOPHAGOGASTRODUODENOSCOPY (EGD) WITH PROPOFOL;  Surgeon: Milus Banister, MD;  [facility];  Service: Endoscopy;  Laterality: N/A;   GASTRECTOMY  03/1954   Transfused 7 pints blood '55, 4 pints '56   GASTRECTOMY  1956   partial   HEMOSTASIS CONTROL  10/19/2019   Procedure: HEMOSTASIS CONTROL;  Surgeon: Milus Banister, MD;  [facility];  Service: Endoscopy;;  hemaspray   HOT HEMOSTASIS N/A 10/19/2019   Procedure: HOT HEMOSTASIS (ARGON PLASMA COAGULATION/BICAP);  Surgeon: Milus Banister, MD;  Location: [facility];  Service: Endoscopy;  Laterality: N/A;   INSERT / REPLACE / REMOVE PACEMAKER  09/05/2014   MOLE REMOVAL Left 2008   "forearm"   TUMOR REMOVAL Left 2002   from finger- left index     Current Outpatient Medications  Medication Sig Dispense Refill   allopurinol (  ZYLOPRIM) 300 MG tablet Take 1 tablet (300 mg total) by mouth daily. 90 tablet 3   b complex vitamins capsule Take 1 capsule by mouth daily.     B Complex-C (SUPER B COMPLEX PO) Take 1 mg by mouth once.     Cholecalciferol (VITAMIN D3) 2000 UNITS capsule Take 1 capsule (2,000 Units total) by mouth daily.     Cyanocobalamin (VITAMIN B 12 PO) Take 1,000 mg by mouth daily.     FeFum-FePoly-FA-B Cmp-C-Biot (INTEGRA PLUS) CAPS TAKE 1 CAPSULE BY MOUTH EVERY MORNING 90 capsule 0   furosemide (LASIX) 40 MG tablet Take 40 mg by mouth.     loratadine (CLARITIN) 10 MG tablet Take 10 mg by mouth as needed.     Magnesium 200 MG TABS Take 1 mg by mouth daily.     Melatonin 10 MG CAPS  Take 10 mg by mouth daily.     Omega-3 Fatty Acids (FISH OIL) 600 MG CAPS Take 600 mg by mouth daily.     pantoprazole (PROTONIX) 40 MG tablet Take 1 tablet (40 mg total) by mouth daily. 90 tablet 3   Polyethylene Glycol 3350 (MIRALAX PO) Take 1 mg by mouth as needed.     Potassium 95 MG TABS Take 1 mg by mouth daily.     tamsulosin (FLOMAX) 0.4 MG CAPS capsule Take 0.4 mg by mouth daily.   11   XARELTO 20 MG TABS tablet TAKE 1 TABLET(20 MG) BY MOUTH DAILY WITH SUPPER 90 tablet 1   No current facility-administered medications for this visit.    Allergies:   Prednisone and Sulfa antibiotics   ROS:  Please see the history of present illness.   Otherwise, review of systems are positive for none.   All other systems are reviewed and negative.    PHYSICAL EXAM: VS:  BP 118/68    Pulse 68    Ht 5\' 7"  (1.702 m)    Wt 251 lb 6.4 oz (114 kg)    SpO2 96%    BMI 39.37 kg/m  , BMI Body mass index is 39.37 kg/m. GENERAL:  Well appearing NECK:  No jugular venous distention, waveform within normal limits, carotid upstroke brisk and symmetric, no bruits, no thyromegaly LUNGS:  Clear to auscultation bilaterally CHEST: Well-healed pacemaker HEART:  PMI not displaced or sustained,S1 and S2 within normal limits, no S3, no S4, no clicks, no rubs, no murmurs ABD:  Flat, positive bowel sounds normal in frequency in pitch, no bruits, no rebound, no guarding, no midline pulsatile mass, no hepatomegaly, no splenomegaly EXT:  2 plus pulses throughout, no edema, no cyanosis no clubbing  EKG:  EKG is  ordered today. The ekg ordered today demonstrates underlying atrial fibrillation with ventricular pacing 100% capture   Recent Labs: 10/16/2020: BUN 24; Creatinine, Ser 0.94; Potassium 3.8; Sodium 138    Lipid Panel    Component Value Date/Time   CHOL 166 11/03/2012 0735   TRIG 45.0 11/03/2012 0735   HDL 49.90 11/03/2012 0735   CHOLHDL 3 11/03/2012 0735   VLDL 9.0 11/03/2012 0735   LDLCALC 107 (H)  11/03/2012 0735   LDLDIRECT 145.0 08/29/2007 1426      Wt Readings from Last 3 Encounters:  05/05/21 251 lb 6.4 oz (114 kg)  04/25/21 247 lb 6.4 oz (112.2 kg)  12/04/20 243 lb (110.2 kg)      Other studies Reviewed: Additional studies/ records that were reviewed today include: Labs. Review of the above records demonstrates:  Please see  elsewhere in the note.     ASSESSMENT AND PLAN:  ATRIAL FIB - Mr. Jovanni Rash Finau has a CHA2DS2 - VASc score of 2.      PPM - He is up to date with follow up.  He had a check on Friday.  He is 14 months with battery normal function.   MR - This was trace.  No change in therapy.   CARDIOMYOPATHY - EF has been lower normal.  This was in July 2022.      SLEEP APNEA - Since I last saw him he has been followed by Dr. Halford Chessman.    Current medicines are reviewed at length with the patient today.  The patient does not have concerns regarding medicines.  The following changes have been made:  None  Labs/ tests ordered today include: None  Orders Placed This Encounter  Procedures   EKG 12-Lead     Disposition:   FU with me 12 months.    Signed, [name], MD  05/05/2021 10:27 AM    [facility]

## 2021-05-05 ENCOUNTER — Other Ambulatory Visit: Payer: Self-pay

## 2021-05-05 ENCOUNTER — Encounter: Payer: Self-pay | Admitting: Cardiology

## 2021-05-05 ENCOUNTER — Ambulatory Visit (INDEPENDENT_AMBULATORY_CARE_PROVIDER_SITE_OTHER): Payer: Medicare Other | Admitting: Cardiology

## 2021-05-05 VITALS — BP 118/68 | HR 68 | Ht 67.0 in | Wt 251.4 lb

## 2021-05-05 DIAGNOSIS — I420 Dilated cardiomyopathy: Secondary | ICD-10-CM | POA: Diagnosis not present

## 2021-05-05 DIAGNOSIS — I482 Chronic atrial fibrillation, unspecified: Secondary | ICD-10-CM | POA: Diagnosis not present

## 2021-05-05 DIAGNOSIS — I442 Atrioventricular block, complete: Secondary | ICD-10-CM | POA: Diagnosis not present

## 2021-05-05 NOTE — Patient Instructions (Signed)
Medication Instructions:  Your physician recommends that you continue on your current medications as directed. Please refer to the Current Medication list given to you today.  *If you need a refill on your cardiac medications before your next appointment, please call your pharmacy*  Lab Work: NONE ordered at this time of appointment   If you have labs (blood work) drawn today and your tests are completely normal, you will receive your results only by: [place] (if you have MyChart) OR A paper copy in the mail If you have any lab test that is abnormal or we need to change your treatment, we will call you to review the results.  Testing/Procedures: NONE ordered at this time of appointment    Follow-Up: At [facility], you and your health needs are our priority.  As part of our continuing mission to provide you with exceptional heart care, we have created designated Provider Care Teams.  These Care Teams include your primary Cardiologist (physician) and Advanced Practice Providers (APPs -  Physician Assistants and Nurse Practitioners) who all work together to provide you with the care you need, when you need it.  Your next appointment:   1 year(s)  The format for your next appointment:   In Person  Provider:   [name], MD    Other Instructions

## 2021-05-06 NOTE — Progress Notes (Signed)
Remote pacemaker transmission.   

## 2021-05-16 ENCOUNTER — Other Ambulatory Visit: Payer: Self-pay

## 2021-05-16 DIAGNOSIS — C61 Malignant neoplasm of prostate: Secondary | ICD-10-CM

## 2021-05-16 NOTE — Progress Notes (Signed)
Spoke with patient, patient has decided he would like to proceed with referral to [facility] for consult on Cyber Knife.   ? ?RN collaborated with Jacob [name] patient coordinator @ [facility] for Cyber Knife to ensure referral was received.   Demographics successfully faxed per request, along with request to have PET PSMA images power shared.   ? ?RN will follow up to ensure referral gets processed and scheduled.  Pt aware that referral has been initiated.   ?

## 2021-05-20 NOTE — Progress Notes (Signed)
RN collaborated with [employer] @ [facility].  Confirmed all information was received was for referral for CyberKnife.  Shantell confirmed, and information is being passed on to the prostate coordinator to scheduled consult.  RN updated patient, verbalized understanding and thankfulness.  Will continue to follow.  ?

## 2021-05-30 NOTE — Progress Notes (Signed)
Pt is set up for Cyber Knife consult @ [facility] on 3/24.  Pt is aware.  ? ?RN will follow up after consult to ensure final treatment decision.  ?

## 2021-06-09 ENCOUNTER — Telehealth: Payer: Self-pay | Admitting: *Deleted

## 2021-06-09 NOTE — Telephone Encounter (Signed)
CALLED PATIENT TO ASK QUESTION, LVM FOR A RETURN CALL 

## 2021-06-10 ENCOUNTER — Other Ambulatory Visit: Payer: Self-pay | Admitting: Urology

## 2021-06-10 ENCOUNTER — Telehealth: Payer: Self-pay | Admitting: *Deleted

## 2021-06-10 ENCOUNTER — Telehealth: Payer: Self-pay | Admitting: Internal Medicine

## 2021-06-10 NOTE — Progress Notes (Signed)
Patient was a consult on 2/10 for Stage T1c adenocarcinoma of the prostate with Gleason Score of 4+4, and PSA of 24.  ? ?Patient requested to be seen by Salome Holmes for a Cyber Knife Consult.  Patient has consult and has decided to proceed with the recommendations of 8 weeks EBRT in combination with ADT.  ? ?Patient received ADT on 1/31.  We will proceed with coordinating fiducial marker's, spaceOAR, and CT Simulation.  ? ?RN provided patient with education on next steps and answered all questions.  No further needs at this time.  ?

## 2021-06-10 NOTE — Telephone Encounter (Signed)
? ?  Pre-operative Risk Assessment  ?  ?Patient Name: Jacob [name]  ?DOB: 1933/11/26 ?MRN: [id number]  ? ?  ? ?Request for Surgical Clearance   ? ?Procedure:  Fiducial Markers and space oar placement prior to radiation  ? ?Date of Surgery:  Clearance 07/03/21                              ?   ?Surgeon:  Dr. Louis Meckel ?Surgeon's Group or Practice Name:  [facility]  ?Phone number:  [phone] ?Fax number:  [phone] ?  ?Type of Clearance Requested:   ?- Pharmacy:  Hold Rivaroxaban (Xarelto) 3 days  ?  ?Type of Anesthesia:  IV Sedation  ?  ?Additional requests/questions:   ? ?Signed, ?Johnna Acosta   ?06/10/2021, 11:15 AM   ?

## 2021-06-10 NOTE — Telephone Encounter (Signed)
Will route to pharm for input. Last OV 04/2021. ?

## 2021-06-10 NOTE — Telephone Encounter (Signed)
Patient with diagnosis of afib on Xarelto for anticoagulation.   ? ?Procedure: Fiducial Markers and space oar placement prior to radiation  ?Date of procedure: 07/03/21 ? ?CHA2DS2-VASc Score = 4  ?This indicates a 4.8% annual risk of stroke. ?The patient's score is based upon: ?CHF History: 0 ?HTN History: 1 ?Diabetes History: 0 ?Stroke History: 0 ?Vascular Disease History: 1 ?Age Score: 2 ?Gender Score: 0 ?  ?CrCl 69 mL/min using adjusted body weight due to obesity ?Platelet count 198K (05/02/20) ? ?Per office protocol, patient can hold Xarelto for 3 days prior to procedure as requested.   ? ?

## 2021-06-10 NOTE — Telephone Encounter (Signed)
CALLED PATIENT TO INFORM OF FID. MARKERS AND SPACE OAR PLACEMENT ON 07-03-21 AND HIS SIM ON 07-10-21- ARRIVAL TIME- 12:45 PM , PATIENT  INFORMED TO ARRIVE WITH FULL BLADDER AND AN EMPTY BOWEL, SPOKE WITH PATIENT AND HE IS AWARE OF THESE APPTS. AND THE INSTRUCTIONS ?

## 2021-06-12 NOTE — Telephone Encounter (Signed)
? ?  Primary Cardiologist: [name], MD ? ?Chart reviewed as part of pre-operative protocol coverage. Given past medical history and time since last visit, based on ACC/AHA guidelines, Jacob [name] would be at acceptable risk for the planned procedure without further cardiovascular testing.  ? ?Patient with diagnosis of afib on Xarelto for anticoagulation.   ?  ?Procedure: Fiducial Markers and space oar placement prior to radiation  ?Date of procedure: 07/03/21 ?  ?CHA2DS2-VASc Score = 4  ?This indicates a 4.8% annual risk of stroke. ?The patient's score is based upon: ?CHF History: 0 ?HTN History: 1 ?Diabetes History: 0 ?Stroke History: 0 ?Vascular Disease History: 1 ?Age Score: 2 ?Gender Score: 0 ?  ?CrCl 69 mL/min using adjusted body weight due to obesity ?Platelet count 198K (05/02/20) ?  ?Per office protocol, patient can hold Xarelto for 3 days prior to procedure as requested. ? ?I will route this recommendation to the requesting party via Epic fax function and remove from pre-op pool. ? ?Please call with questions. ? ?Jacob [name]. Mostyn Varnell NP-C ? ?  ?06/12/2021, 8:20 AM ?[place] ?[address] ?Office [phone] Fax [phone] ? ? ? ? ?

## 2021-06-21 ENCOUNTER — Other Ambulatory Visit: Payer: Self-pay | Admitting: Internal Medicine

## 2021-06-21 DIAGNOSIS — I480 Paroxysmal atrial fibrillation: Secondary | ICD-10-CM

## 2021-06-23 ENCOUNTER — Encounter (HOSPITAL_COMMUNITY): Payer: Self-pay | Admitting: Emergency Medicine

## 2021-06-23 ENCOUNTER — Emergency Department (HOSPITAL_COMMUNITY)
Admission: EM | Admit: 2021-06-23 | Discharge: 2021-06-23 | Disposition: A | Discharge status: Home / Self Care | Payer: Medicare Other | Attending: Emergency Medicine | Admitting: Emergency Medicine

## 2021-06-23 ENCOUNTER — Telehealth: Payer: Self-pay | Admitting: Internal Medicine

## 2021-06-23 DIAGNOSIS — K625 Hemorrhage of anus and rectum: Secondary | ICD-10-CM | POA: Insufficient documentation

## 2021-06-23 DIAGNOSIS — R42 Dizziness and giddiness: Secondary | ICD-10-CM | POA: Insufficient documentation

## 2021-06-23 DIAGNOSIS — Z7901 Long term (current) use of anticoagulants: Secondary | ICD-10-CM | POA: Diagnosis not present

## 2021-06-23 DIAGNOSIS — Z79899 Other long term (current) drug therapy: Secondary | ICD-10-CM | POA: Diagnosis not present

## 2021-06-23 DIAGNOSIS — I10 Essential (primary) hypertension: Secondary | ICD-10-CM | POA: Insufficient documentation

## 2021-06-23 DIAGNOSIS — I4891 Unspecified atrial fibrillation: Secondary | ICD-10-CM | POA: Diagnosis not present

## 2021-06-23 LAB — COMPREHENSIVE METABOLIC PANEL
ALT: 23 U/L (ref 0–44)
AST: 29 U/L (ref 15–41)
Albumin: 4.4 g/dL (ref 3.5–5.0)
Alkaline Phosphatase: 89 U/L (ref 38–126)
Anion gap: 9 (ref 5–15)
BUN: 27 mg/dL — ABNORMAL HIGH (ref 8–23)
CO2: 27 mmol/L (ref 22–32)
Calcium: 9.8 mg/dL (ref 8.9–10.3)
Chloride: 104 mmol/L (ref 98–111)
Creatinine, Ser: 1.08 mg/dL (ref 0.61–1.24)
GFR, Estimated: >60 mL/min (ref >60)
Glucose, Bld: 115 mg/dL — ABNORMAL HIGH (ref 70–99)
Potassium: 3.8 mmol/L (ref 3.5–5.1)
Sodium: 140 mmol/L (ref 135–145)
Total Bilirubin: 2.3 mg/dL — ABNORMAL HIGH (ref 0.3–1.2)
Total Protein: 7.5 g/dL (ref 6.5–8.1)

## 2021-06-23 LAB — CBC
HCT: 37 % — ABNORMAL LOW (ref 39.0–52.0)
Hemoglobin: 11.6 g/dL — ABNORMAL LOW (ref 13.0–17.0)
MCH: 20.2 pg — ABNORMAL LOW (ref 26.0–34.0)
MCHC: 31.4 g/dL (ref 30.0–36.0)
MCV: 64.3 fL — ABNORMAL LOW (ref 80.0–100.0)
Platelets: 141 10*3/uL — ABNORMAL LOW (ref 150–400)
RBC: 5.75 MIL/uL (ref 4.22–5.81)
RDW: 17.6 % — ABNORMAL HIGH (ref 11.5–15.5)
WBC: 5.7 10*3/uL (ref 4.0–10.5)
nRBC: 0 % (ref 0.0–0.2)

## 2021-06-23 LAB — POC OCCULT BLOOD, ED: Fecal Occult Bld: POSITIVE — AB

## 2021-06-23 LAB — TYPE AND SCREEN
ABO/RH(D): A POS
Antibody Screen: NEGATIVE

## 2021-06-23 NOTE — Telephone Encounter (Signed)
2 weeks? ?He is on blood thinners.  He does have a history of hemorrhagic gastritis.  It would be best that he present himself to the emergency room given this history and complaints of lightheadedness.  They can assess him for any evidence of GI bleeding and check his hemoglobin.  The need, if any, for admission can be determined as well. ?

## 2021-06-23 NOTE — ED Provider Triage Note (Signed)
Emergency Medicine Provider Triage Evaluation Note ? ?Jacob [name] , a 88 y.o. male  was evaluated in triage.  Pt complains of melena for two weeks. 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 He also reports some lightheadedness. Denies any nausea, vomiting, chest pain, or SOB. On xaerlto.  ? ?Review of Systems  ?Positive:  ?Negative:  ? ?Physical Exam  ?BP (!) 167/83 (BP Location: Right Arm)   Pulse 87   Temp 97.6 ?F (36.4 ?C) (Oral)   Resp 18   Ht '5\' 7"'$  (1.702 m)   Wt 111.6 kg   SpO2 94%   BMI 38.53 kg/m?  ?Gen:   Awake, no distress   ?Resp:  Normal effort  ?MSK:   Moves extremities without difficulty  ?Other:  Abdomen soft, NBS, nontender to palpation. No guarding or rebound.  ? ?Medical Decision Making  ?Medically screening exam initiated at 1:47 PM.  Appropriate orders placed.  Khanh Tanori Maranto was informed that the remainder of the evaluation will be completed by another provider, this initial triage assessment does not replace that evaluation, and the importance of remaining in the ED until their evaluation is complete. ? ?Will order basic labs and POC hemoccult.  ?  ?Sherrell Puller, PA-C ?06/23/21 1350 ? ?

## 2021-06-23 NOTE — Discharge Instructions (Signed)
It appears you have a very mild amount of rectal bleeding at this time.  Today, your stool is "occult positive," which means that we can only see it with a chemical test.  Follow-up with your primary care doctor for checkup in 3 to 5 days.  Continue taking your usual medications.  If you feel like you are having indigestion you can use Rolaids, or Maalox before meals and at bedtime.  Return here, as needed for problems. ?

## 2021-06-23 NOTE — Telephone Encounter (Signed)
Spoke with pt and he states he has been having black stools for about 2 weeks. Reports he has not taken any pepto and is not taking any Iron supplements. States he has also felt lightheaded for about 2 weeks. Please advise. ?

## 2021-06-23 NOTE — ED Triage Notes (Addendum)
Per pt, states he has been bleeding rectally for 2 weeks-denies abdominal pain-sent to ED via [facility]-patient is on a blood thinner ?

## 2021-06-23 NOTE — Telephone Encounter (Signed)
Prescription refill request for Xarelto received.  ?Indication: Afib  ?Last office visit: 05/05/21 [facility]) ?Weight: 114kg ?Age: 88 ?Scr: 0.94 (10/16/20) ?CrCl: 92.56m/min ? ?Appropriate dose and refill sent to requested pharmacy.  ?

## 2021-06-23 NOTE — Telephone Encounter (Signed)
Inbound call from patient state he have been having dark stool for 2 weeks and have been feeling light headed.  ?

## 2021-06-23 NOTE — ED Provider Notes (Signed)
?[facility] DEPT ?Provider Note ? ? ?CSN: [id number] ?Arrival date & time: 06/23/21  1335 ? ?  ? ?History ? ?Chief Complaint  ?Patient presents with  ? Rectal Bleeding  ? ? ?Jacob [name] is a 88 y.o. male. ? ?HPI ?He presents for evaluation of dark stool for a couple days with hard bowel movement and a fecal impaction that he was able to dislodge using his finger.  He denies nausea, vomiting, fever or chills.  He occasionally gets lightheaded.  He has previously had rectal bleeding from an upper source, after which she had to have "part of my stomach removed."  He is not having chest pain, shortness of breath, paresthesia or weakness. ?  ? ?Home Medications ?Prior to Admission medications   ?Medication Sig Start Date End Date Taking? Authorizing Provider  ?allopurinol (ZYLOPRIM) 300 MG tablet Take 1 tablet (300 mg total) by mouth daily. 05/04/13   Norins, Heinz Knuckles, MD  ?b complex vitamins capsule Take 1 capsule by mouth daily.    [provider]  ?B Complex-C (SUPER B COMPLEX PO) Take 1 mg by mouth once.    [provider]  ?Cholecalciferol (VITAMIN D3) 2000 UNITS capsule Take 1 capsule (2,000 Units total) by mouth daily. 11/02/12   Norins, Heinz Knuckles, MD  ?Cyanocobalamin (VITAMIN B 12 PO) Take 1,000 mg by mouth daily.    [provider]  ?FeFum-FePoly-FA-B Cmp-C-Biot (INTEGRA PLUS) CAPS TAKE 1 CAPSULE BY MOUTH EVERY MORNING 03/27/21   Curt Bears, MD  ?furosemide (LASIX) 40 MG tablet Take 40 mg by mouth.    [provider]  ?loratadine (CLARITIN) 10 MG tablet Take 10 mg by mouth as needed.    [provider]  ?Magnesium 200 MG TABS Take 1 mg by mouth daily.    [provider]  ?Melatonin 10 MG CAPS Take 10 mg by mouth daily.    [provider]  ?Omega-3 Fatty Acids (FISH OIL) 600 MG CAPS Take 600 mg by mouth daily.    [provider]  ?pantoprazole (PROTONIX) 40 MG tablet Take 1 tablet (40 mg total) by  mouth daily. 10/16/20   Irene Shipper, MD  ?Polyethylene Glycol 3350 (MIRALAX PO) Take 1 mg by mouth as needed.    [provider]  ?Potassium 95 MG TABS Take 1 mg by mouth daily.    [provider]  ?tamsulosin (FLOMAX) 0.4 MG CAPS capsule Take 0.4 mg by mouth daily.  11/13/13   [provider]  ?Alveda Reasons 20 MG TABS tablet TAKE 1 TABLET(20 MG) BY MOUTH DAILY WITH SUPPER 06/23/21   [name], MD  ?   ? ?Allergies    ?Prednisone and Sulfa antibiotics   ? ?Review of Systems   ?Review of Systems ? ?Physical Exam ?Updated Vital Signs ?BP 133/68   Pulse 60   Temp 97.6 ?F (36.4 ?C) (Oral)   Resp 15   Ht '5\' 7"'$  (1.702 m)   Wt 111.6 kg   SpO2 97%   BMI 38.53 kg/m?  ?Physical Exam ?Vitals and nursing note reviewed.  ?Constitutional:   ?   General: He is not in acute distress. ?   Appearance: He is well-developed. He is not ill-appearing.  ?HENT:  ?   Head: Normocephalic and atraumatic.  ?   Right Ear: External ear normal.  ?   Left Ear: External ear normal.  ?Eyes:  ?   Conjunctiva/sclera: Conjunctivae normal.  ?   Pupils: Pupils are equal, round,  and reactive to light.  ?Neck:  ?   Trachea: Phonation normal.  ?Cardiovascular:  ?   Rate and Rhythm: Normal rate and regular rhythm.  ?   Heart sounds: Normal heart sounds.  ?Pulmonary:  ?   Effort: Pulmonary effort is normal. No respiratory distress.  ?   Breath sounds: Normal breath sounds. No stridor.  ?Abdominal:  ?   General: There is no distension.  ?   Palpations: Abdomen is soft.  ?   Tenderness: There is no abdominal tenderness.  ?Genitourinary: ?   Comments: Normal anus.  Small amount of soft brown stool in the rectal vault. ?Musculoskeletal:     ?   General: Normal range of motion.  ?   Cervical back: Normal range of motion and neck supple.  ?Skin: ?   General: Skin is warm and dry.  ?Neurological:  ?   Mental Status: He is alert and oriented to person, place, and time.  ?   Cranial Nerves: No cranial nerve deficit.  ?   Sensory: No  sensory deficit.  ?   Motor: No abnormal muscle tone.  ?   Coordination: Coordination normal.  ?Psychiatric:     ?   Mood and Affect: Mood normal.     ?   Behavior: Behavior normal.     ?   Thought Content: Thought content normal.     ?   Judgment: Judgment normal.  ? ? ?ED Results / Procedures / Treatments   ?Labs ?(all labs ordered are listed, but only abnormal results are displayed) ?Labs Reviewed  ?COMPREHENSIVE METABOLIC PANEL - Abnormal; Notable for the following components:  ?    Result Value  ? Glucose, Bld 115 (*)   ? BUN 27 (*)   ? Total Bilirubin 2.3 (*)   ? All other components within normal limits  ?CBC  ?POC OCCULT BLOOD, ED  ?TYPE AND SCREEN  ? ? ?EKG ?None ? ?Radiology ?No results found. ? ?Procedures ?Procedures  ? ? ?Medications Ordered in ED ?Medications - No data to display ? ?ED Course/ Medical Decision Making/ A&P ?  ?                        ?Medical Decision Making ?Patient presenting for evaluation of dark stool, currently on Xarelto.  He has a history of upper GI bleeding.  He is on pantoprazole.  He does not have near syncope or syncope.  He has been constipated and yesterday had 2 disimpact himself.  He reports that his stool has been black in color for several days.  He is not using the stool softener.  He has been eating well and came here today driving his own vehicle. ? ?Problems Addressed: ?Rectal bleeding: undiagnosed new problem with uncertain prognosis ? ?Amount and/or Complexity of Data Reviewed ?Independent Historian:  ?   Details: He is a cogent historian.  His daughter arrived at the time of discharge and I discussed the case with her. ?Labs: ordered. ?   Details: CBC, metabolic panel, fecal occult blood-normal except occult blood positive, BUN elevated, bilirubin high, hemoglobin slightly low. ? ?Risk ?Decision regarding hospitalization. ?Risk Details: Patient presenting for evaluation of rectal bleeding characterized by black stool.  He is on Xarelto.  Stool occult is  positive.  BUN is elevated.  I suspect he has a mild upper GI bleed.  Hemoglobin is minimally low.  Blood pressure is normal.  He does not require hospitalization at this time.  He is stable for discharge and recommended to follow-up with his PCP.  I encouraged him to use an acid if needed for indigestion.  He is encouraged to follow-up with his PCP in 3 to 5 days, sooner if needed and return here for problems. ? ? ? ? ? ? ? ? ? ? ?Final Clinical Impression(s) / ED Diagnoses ?Final diagnoses:  ?None  ? ? ?Rx / DC Orders ?ED Discharge Orders   ? ? None  ? ?  ? ? ?  ?Daleen Bo, MD ?06/23/21 1751 ? ?

## 2021-06-23 NOTE — Telephone Encounter (Signed)
Spoke with pt and he is aware of recommendations and states he will proceed to [facility] ER. ?

## 2021-06-27 ENCOUNTER — Other Ambulatory Visit: Payer: Self-pay | Admitting: Urology

## 2021-06-27 ENCOUNTER — Encounter (HOSPITAL_BASED_OUTPATIENT_CLINIC_OR_DEPARTMENT_OTHER): Payer: Self-pay | Admitting: Urology

## 2021-06-27 ENCOUNTER — Other Ambulatory Visit: Payer: Self-pay

## 2021-06-27 NOTE — Progress Notes (Signed)
Spoke w/ via phone for pre-op interview---pt and wife anne per pt request ?Lab needs dos----    none           ?Lab results------cbc, cmet 06-23-2021 epic ?COVID test -----patient states asymptomatic no test needed ?Arrive at -------741 am 07-03-2021 ?NPO after MN NO Solid Food.  Clear liquids from MN until---545 am ?Med rec completed ?Medications to take morning of surgery ----- allopurinol, pantoprazole, flomax ?Diabetic medication -----n/a ?Patient instructed no nail polish to be worn day of surgery ?Patient instructed to bring photo id and insurance card day of surgery ?Patient aware to have Driver (ride )  daughter Ronita Hipps cell [phone]/ caregiver  wife anne   for 24 hours after surgery  ?Patient Special Instructions -----bring bipap mask tubing and machine ?Pre-Op special Istructions -----pt aware to stop xarelto 3 days before surgery last dose is to be 06-29-2021 per megan supple rph note 06-10-2021 chart/epic ?Patient verbalized understanding of instructions that were given at this phone interview. ?Patient denies shortness of breath, chest pain, fever, cough at this phone interview.  ? ?Cardiac clearance dr Jeneen Rinks hochrein 06-10-2021 chart/eoic for 07-03-2021 surgery ?Lov dr Jeneen Rinks hochrein 05-05-2021 epic ?Last device check note 05-02-18-23 epic ?Pulmonary lov dr v Halford Chessman 06-06-2020 epic ?Sleep study 08-07-2020 epic ?

## 2021-07-01 ENCOUNTER — Encounter: Payer: Self-pay | Admitting: Internal Medicine

## 2021-07-01 NOTE — Progress Notes (Signed)
PERIOPERATIVE PRESCRIPTION FOR IMPLANTED CARDIAC DEVICE PROGRAMMING ? ?Patient Information: ?Name:  Jacob [name]  ?DOB:  09-22-1933  ?MRN:  [id number]  ?  ?Planned Procedure:  GOLD SEEDS SPACE OAR  ?Surgeon:  DR Louis Meckel  ?Date of Procedure:  07-03-2021  ?Cautery will be used.  ?Position during surgery:    ? ?Please send documentation back to:  ?[facility] (Fax # [phone])  ?Device Information: ? ?Clinic EP Physician:  Cristopher Peru, MD  ? ?Device Type:  Pacemaker ?Manufacturer and Phone #:  [employer]: [phone] ?Pacemaker Dependent?:  Yes.   ?Date of Last Device Check:  05/02/2021 (remote) Normal Device Function?:  Yes.   ? ?Electrophysiologist's Recommendations: ? ?Have magnet available. ?Provide continuous ECG monitoring when magnet is used or reprogramming is to be performed.  ?Procedure should not interfere with device function.  No device programming or magnet placement needed. ? ?Per Device Clinic Standing Orders, ?Jacob Curia, RN  ?11:00 AM 07/01/2021  ?

## 2021-07-01 NOTE — Progress Notes (Signed)
Cardiac device orders completed dated 07-01-2021 in epic and copy in chart. ?

## 2021-07-02 NOTE — Anesthesia Preprocedure Evaluation (Addendum)
Anesthesia Evaluation  ?Patient identified by MRN, date of birth, ID band ?Patient awake ? ? ? ?Reviewed: ?Allergy & Precautions, NPO status , Patient's Chart, lab work & pertinent test results ? ?Airway ?Mallampati: II ? ?TM Distance: >3 FB ?Neck ROM: Full ? ? ? Dental ?no notable dental hx. ? ?  ?Pulmonary ?sleep apnea and Continuous Positive Airway Pressure Ventilation , former smoker,  ?  ?Pulmonary exam normal ? ? ? ? ? ? ? Cardiovascular ?hypertension, + pacemaker  ?Rhythm:Regular Rate:Normal ? ? ?  ?Neuro/Psych ?negative neurological ROS ? negative psych ROS  ? GI/Hepatic ?Neg liver ROS, PUD, GERD  Medicated,  ?Endo/Other  ?negative endocrine ROS ? Renal/GU ?  ? ?Prostate Ca ? ?  ?Musculoskeletal ? ?(+) Arthritis , Osteoarthritis,   ? Abdominal ?Normal abdominal exam  (+)   ?Peds ? Hematology ? ?(+) Blood dyscrasia, anemia ,   ?Anesthesia Other Findings ? ? Reproductive/Obstetrics ? ?  ? ? ? ? ? ? ? ? ? ? ? ? ? ?  ?  ? ? ? ? ? ? ? ?Anesthesia Physical ?Anesthesia Plan ? ?ASA: 3 ? ?Anesthesia Plan: MAC  ? ?Post-op Pain Management:   ? ?Induction: Intravenous ? ?PONV Risk Score and Plan: 1 and Ondansetron, Dexamethasone, Propofol infusion and Treatment may vary due to age or medical condition ? ?Airway Management Planned: Simple Face Mask, Natural Airway and Nasal Cannula ? ?Additional Equipment: None ? ?Intra-op Plan:  ? ?Post-operative Plan:  ? ?Informed Consent: I have reviewed the patients History and Physical, chart, labs and discussed the procedure including the risks, benefits and alternatives for the proposed anesthesia with the patient or authorized representative who has indicated his/her understanding and acceptance.  ? ? ? ?Dental advisory given ? ?Plan Discussed with: CRNA ? ?Anesthesia Plan Comments: (Lab Results ?     Component                Value               Date                 ?     WBC                      5.7                 06/23/2021           ?     HGB                       11.6 (L)            06/23/2021           ?     HCT                      37.0 (L)            06/23/2021           ?     MCV                      64.3 (L)            06/23/2021           ?     PLT  141 (L)             06/23/2021           ?Lab Results ?     Component                Value               Date                 ?     NA                       140                 06/23/2021           ?     K                        3.8                 06/23/2021           ?     CO2                      27                  06/23/2021           ?     GLUCOSE                  115 (H)             06/23/2021           ?     BUN                      27 (H)              06/23/2021           ?     CREATININE               1.08                06/23/2021           ?     CALCIUM                  9.8                 06/23/2021           ?     EGFR                     76                  09/03/2020           ?     GFRNONAA                 >60                 06/23/2021           ?ECHO 2022: ?1. Mild, eccentric, anteriorly directed MR suggests prolapse of posterior  ?MV leaflet.  ??2. Left ventricular ejection fraction, by estimation, is 60 to 65%. The  ?left ventricle has normal function. The left ventricle has no regional  ?wall motion abnormalities. There is mild  left ventricular hypertrophy.  ?Left ventricular diastolic function  ?could not be evaluated.  ??3. Right ventricular systolic function is normal. The right ventricular  ?size is mildly enlarged. There is normal pulmonary artery systolic  ?pressure.  ??4. Left atrial size was severely dilated.  ??5. Right atrial size was mildly dilated.  ??6. The mitral valve is normal in structure. Mild mitral valve  ?regurgitation. No evidence of mitral stenosis. There is mild prolapse of  ?posterior leaflet of the mitral valve.  ??7. The aortic valve is tricuspid. Aortic valve regurgitation is mild.  ?Mild to moderate aortic valve stenosis.  ??8. The  inferior vena cava is normal in size with greater than 50%  ?respiratory variability, suggesting right atrial pressure of 3 mmHg. )  ? ? ? ? ?Anesthesia Quick Evaluation ? ?

## 2021-07-03 ENCOUNTER — Encounter (HOSPITAL_BASED_OUTPATIENT_CLINIC_OR_DEPARTMENT_OTHER)
Admission: RE | Disposition: A | Discharge status: Home / Self Care | Payer: Self-pay | Source: Home / Self Care | Attending: Urology

## 2021-07-03 ENCOUNTER — Ambulatory Visit (HOSPITAL_BASED_OUTPATIENT_CLINIC_OR_DEPARTMENT_OTHER): Payer: Medicare Other | Admitting: Anesthesiology

## 2021-07-03 ENCOUNTER — Other Ambulatory Visit: Payer: Self-pay | Admitting: Internal Medicine

## 2021-07-03 ENCOUNTER — Encounter (HOSPITAL_BASED_OUTPATIENT_CLINIC_OR_DEPARTMENT_OTHER): Payer: Self-pay | Admitting: Urology

## 2021-07-03 ENCOUNTER — Other Ambulatory Visit: Payer: Self-pay

## 2021-07-03 ENCOUNTER — Ambulatory Visit (HOSPITAL_BASED_OUTPATIENT_CLINIC_OR_DEPARTMENT_OTHER)
Admission: RE | Admit: 2021-07-03 | Discharge: 2021-07-03 | Disposition: A | Discharge status: Home / Self Care | Payer: Medicare Other | Attending: Urology | Admitting: Urology

## 2021-07-03 DIAGNOSIS — C61 Malignant neoplasm of prostate: Secondary | ICD-10-CM | POA: Diagnosis present

## 2021-07-03 DIAGNOSIS — Z950 Presence of cardiac pacemaker: Secondary | ICD-10-CM | POA: Diagnosis not present

## 2021-07-03 DIAGNOSIS — D630 Anemia in neoplastic disease: Secondary | ICD-10-CM | POA: Diagnosis not present

## 2021-07-03 DIAGNOSIS — G4730 Sleep apnea, unspecified: Secondary | ICD-10-CM | POA: Insufficient documentation

## 2021-07-03 DIAGNOSIS — I10 Essential (primary) hypertension: Secondary | ICD-10-CM | POA: Diagnosis not present

## 2021-07-03 DIAGNOSIS — Z87891 Personal history of nicotine dependence: Secondary | ICD-10-CM | POA: Diagnosis not present

## 2021-07-03 DIAGNOSIS — D568 Other thalassemias: Secondary | ICD-10-CM

## 2021-07-03 DIAGNOSIS — G4733 Obstructive sleep apnea (adult) (pediatric): Secondary | ICD-10-CM

## 2021-07-03 HISTORY — DX: Localized edema: R60.0

## 2021-07-03 HISTORY — DX: Frequency of micturition: R35.0

## 2021-07-03 HISTORY — DX: Other seasonal allergic rhinitis: J30.2

## 2021-07-03 HISTORY — DX: Gout, unspecified: M10.9

## 2021-07-03 HISTORY — DX: Spontaneous ecchymoses: R23.3

## 2021-07-03 HISTORY — PX: SPACE OAR INSTILLATION: SHX6769

## 2021-07-03 HISTORY — DX: Other constipation: K59.09

## 2021-07-03 HISTORY — DX: Thalassemia minor: D56.3

## 2021-07-03 HISTORY — PX: GOLD SEED IMPLANT: SHX6343

## 2021-07-03 HISTORY — DX: Sleep apnea, unspecified: G47.30

## 2021-07-03 HISTORY — DX: Presence of spectacles and contact lenses: Z97.3

## 2021-07-03 LAB — POCT I-STAT, CHEM 8
BUN: 33 mg/dL — ABNORMAL HIGH (ref 8–23)
Calcium, Ion: 1.35 mmol/L (ref 1.15–1.40)
Chloride: 106 mmol/L (ref 98–111)
Creatinine, Ser: 0.9 mg/dL (ref 0.61–1.24)
Glucose, Bld: 101 mg/dL — ABNORMAL HIGH (ref 70–99)
HCT: 37 % — ABNORMAL LOW (ref 39.0–52.0)
Hemoglobin: 12.6 g/dL — ABNORMAL LOW (ref 13.0–17.0)
Potassium: 3.6 mmol/L (ref 3.5–5.1)
Sodium: 142 mmol/L (ref 135–145)
TCO2: 26 mmol/L (ref 22–32)

## 2021-07-03 SURGERY — INSERTION, GOLD SEEDS
Anesthesia: Monitor Anesthesia Care | Site: Prostate

## 2021-07-03 MED ORDER — LIDOCAINE HCL (PF) 2 % IJ SOLN
INTRAMUSCULAR | Status: AC
  Filled 2021-07-03: qty 5

## 2021-07-03 MED ORDER — ONDANSETRON HCL 4 MG/2ML IJ SOLN
INTRAMUSCULAR | Status: AC
  Filled 2021-07-03: qty 2

## 2021-07-03 MED ORDER — ARTIFICIAL TEARS OPHTHALMIC OINT
TOPICAL_OINTMENT | OPHTHALMIC | Status: AC
  Filled 2021-07-03: qty 3.5

## 2021-07-03 MED ORDER — LACTATED RINGERS IV SOLN: INTRAVENOUS | Status: DC

## 2021-07-03 MED ORDER — ONDANSETRON HCL 4 MG/2ML IJ SOLN
INTRAMUSCULAR | Status: DC | PRN
  Administered 2021-07-03: 4 mg via INTRAVENOUS

## 2021-07-03 MED ORDER — ACETAMINOPHEN 10 MG/ML IV SOLN: 1000.0000 mg | Freq: Once | INTRAVENOUS | Status: DC | PRN

## 2021-07-03 MED ORDER — FENTANYL CITRATE (PF) 100 MCG/2ML IJ SOLN
INTRAMUSCULAR | Status: DC | PRN
  Administered 2021-07-03: 25 ug via INTRAVENOUS

## 2021-07-03 MED ORDER — PROPOFOL 1000 MG/100ML IV EMUL
INTRAVENOUS | Status: AC
Start: 2021-07-03 — End: ?
  Filled 2021-07-03: qty 100

## 2021-07-03 MED ORDER — SODIUM CHLORIDE (PF) 0.9 % IJ SOLN
INTRAMUSCULAR | Status: DC | PRN
  Administered 2021-07-03: 10 mL

## 2021-07-03 MED ORDER — FENTANYL CITRATE (PF) 100 MCG/2ML IJ SOLN
INTRAMUSCULAR | Status: AC
Start: 2021-07-03 — End: ?
  Filled 2021-07-03: qty 2

## 2021-07-03 MED ORDER — LIDOCAINE HCL 1 % IJ SOLN
INTRAMUSCULAR | Status: DC | PRN
  Administered 2021-07-03: 20 mL

## 2021-07-03 MED ORDER — VITAMIN B 12 100 MCG PO LOZG: 1000.0000 mg | LOZENGE | Freq: Every day | ORAL | Status: DC

## 2021-07-03 MED ORDER — PROPOFOL 500 MG/50ML IV EMUL
INTRAVENOUS | Status: DC | PRN
  Administered 2021-07-03: 200 ug/kg/min via INTRAVENOUS

## 2021-07-03 MED ORDER — TRAMADOL HCL 50 MG PO TABS: 50.0000 mg | ORAL_TABLET | Freq: Four times a day (QID) | ORAL | 0 refills | Status: DC | PRN

## 2021-07-03 MED ORDER — FENTANYL CITRATE (PF) 100 MCG/2ML IJ SOLN: 25.0000 ug | INTRAMUSCULAR | Status: DC | PRN

## 2021-07-03 MED ORDER — FLEET ENEMA 7-19 GM/118ML RE ENEM: 1.0000 | ENEMA | Freq: Once | RECTAL | Status: DC

## 2021-07-03 MED ORDER — DEXAMETHASONE SODIUM PHOSPHATE 10 MG/ML IJ SOLN
INTRAMUSCULAR | Status: AC
  Filled 2021-07-03: qty 1

## 2021-07-03 MED ORDER — CEFAZOLIN SODIUM-DEXTROSE 2-4 GM/100ML-% IV SOLN
2.0000 g | INTRAVENOUS | Status: AC
  Administered 2021-07-03: 2 g via INTRAVENOUS

## 2021-07-03 MED ORDER — LIDOCAINE HCL (CARDIAC) PF 100 MG/5ML IV SOSY
PREFILLED_SYRINGE | INTRAVENOUS | Status: DC | PRN
  Administered 2021-07-03: 40 mg via INTRAVENOUS

## 2021-07-03 MED ORDER — CEFAZOLIN SODIUM-DEXTROSE 2-4 GM/100ML-% IV SOLN
INTRAVENOUS | Status: AC
  Filled 2021-07-03: qty 100

## 2021-07-03 MED ORDER — PROPOFOL 10 MG/ML IV BOLUS
INTRAVENOUS | Status: AC
  Filled 2021-07-03: qty 20

## 2021-07-03 SURGICAL SUPPLY — 28 items
BLADE CLIPPER SENSICLIP SURGIC (BLADE) ×2 IMPLANT
CNTNR URN SCR LID CUP LEK RST (MISCELLANEOUS) ×1 IMPLANT
CONT SPEC 4OZ STRL OR WHT (MISCELLANEOUS) ×2
COVER BACK TABLE 60X90IN (DRAPES) ×2 IMPLANT
DRSG TEGADERM 4X4.75 (GAUZE/BANDAGES/DRESSINGS) ×2 IMPLANT
DRSG TEGADERM 8X12 (GAUZE/BANDAGES/DRESSINGS) ×2 IMPLANT
GAUZE SPONGE 4X4 12PLY STRL (GAUZE/BANDAGES/DRESSINGS) ×2 IMPLANT
GLOVE BIO SURGEON STRL SZ7.5 (GLOVE) ×2 IMPLANT
GLOVE BIOGEL PI IND STRL 7.0 (GLOVE) IMPLANT
GLOVE BIOGEL PI IND STRL 8.5 (GLOVE) IMPLANT
GLOVE BIOGEL PI INDICATOR 7.0 (GLOVE) ×1
GLOVE BIOGEL PI INDICATOR 8.5 (GLOVE) ×1
GLOVE ECLIPSE 8.0 STRL XLNG CF (GLOVE) ×2 IMPLANT
GLOVE SURG ORTHO 8.5 STRL (GLOVE) ×2 IMPLANT
IMPL SPACEOAR VUE SYSTEM (Spacer) ×1 IMPLANT
IMPLANT SPACEOAR VUE SYSTEM (Spacer) ×2 IMPLANT
KIT TURNOVER CYSTO (KITS) ×2 IMPLANT
MARKER GOLD PRELOAD 1.2X3 (Urological Implant) ×1 IMPLANT
MARKER SKIN DUAL TIP RULER LAB (MISCELLANEOUS) ×2 IMPLANT
NDL SPNL 22GX7 QUINCKE BK (NEEDLE) ×1 IMPLANT
NEEDLE SPNL 22GX7 QUINCKE BK (NEEDLE) ×2 IMPLANT
SEED GOLD PRELOAD 1.2X3 (Urological Implant) ×2 IMPLANT
SHEATH ULTRASOUND LTX NONSTRL (SHEATH) ×1 IMPLANT
SURGILUBE 2OZ TUBE FLIPTOP (MISCELLANEOUS) ×2 IMPLANT
SYR 10ML LL (SYRINGE) ×2 IMPLANT
SYR CONTROL 10ML LL (SYRINGE) ×2 IMPLANT
TOWEL OR 17X26 10 PK STRL BLUE (TOWEL DISPOSABLE) ×2 IMPLANT
UNDERPAD 30X36 HEAVY ABSORB (UNDERPADS AND DIAPERS) ×2 IMPLANT

## 2021-07-03 NOTE — H&P (Signed)
f/u for Prostate Cancer  ?HPI: Jacob [name] is a 88 year-old male established patient who is here for f/u while on Active Surveillance for Prostate Cancer . ? ?The patient was last seen Jan 2023.  ? ?He was diagnosed with prostate cancer in 09/09/2013. At the time of his prostate cancer diagnosis his PSA was 4.61. The patient's Gleason score at the time of diagnosis was 3+3=6. There were 1 positive cores on his initial biospy. He has had 3 biopsy(ies). The patient's most recent biopsy was approximately 03/17/2021.  ? ?The patient has not had a prostate MRI.  ? ?His most recent PSA is 24.0. This was drawn on approximately 01/07/2021. PSA History: 10/22: 24.0, 4/22: 18.9 , 12/21: 15.3; 8/21: 13.4, 3/21: 10.50, 8/20: 12.15, 12/19: 9.84, 6/19: 7.9, 3/19: 8.69, 7/18: 7.8, 1/18: 5.94, 6/17: 5.70.  ? ?He does have problems with erections. He does not have urinary incontinence. He does not have an abnormal sensation when he needs to urinate. He does not have to strain or bear down to start his urinary stream.  ? ?He is not having pain in new locations. He does have a good appetite. His bowels are moving normally. He has not seen blood in his stool since the biopsy. He has not recently had unwanted weight loss.  ? ?8/18: Re-established goals of care - will not perform any additional biopsies as patient is not interested in aggressive curative therapy. Will initiate ADT at the appropriate time as treatment for his CaP.  ?10/21: The patient was not willing to we last week jump to biopsy, but wanted to consider radiation. PET scan was negative for metastatic disease.  ? ?The patient is no longer having intercourse, is not worried about the retrograde ejaculation.  ? ?Minimal voiding symptoms on tamsulosin 0.4 mg.  ? ?11/22: PMSA PET demonstrated no evidence of metastatic disease  ?Repeat prostate biopsy 03/20/2021, 8/12: 4+4 = 8 in 3 cores, 4+3 = 7 in 1 core, 3+4 = 7 and 4 cores  ? ?Interval: Patient presents today to discuss  his treatment. He had met with radiation oncology and ultimately decided to undergo 8 weeks of external beam radiation. However, he has reconsidered and would like to do the CyberKnife which is only available at [facility].  ? ?Since starting androgen deprivation the patient notes decreased memory, gastrointestinal irritability and worsening urinary tract symptoms. Denies any dysuria or gross hematuria. He is able to void without incontinence. Does have increased frequency and a weak stream.  ? ?  ?AUA Symptom Score: Less than 20% of the time he has the sensation of not emptying his bladder completely when finished urinating. Less than 50% of the time he has to urinate again fewer than two hours after he has finished urinating. Less than 50% of the time he has to start and stop again several times when he urinates. He never finds it difficult to postpone urination. 50% of the time he has a weak urinary stream. Less than 20% of the time he has to push or strain to begin urination. He has to get up to urinate 1 time from the time he goes to bed until the time he gets up in the morning.  ? ?Calculated AUA Symptom Score: 10 ? ?  ?QOL Score: He would feel pleased if he had to live with his urinary condition the way it is now for the rest of his life.  ? ?Calculated QOL Symptom Score: 1 ? ?  ?ALLERGIES: PredniSONE (Pak) TABS ?Sulfa  Drugs ?  ? ?MEDICATIONS: Tamsulosin Hcl 0.4 mg capsule 2 capsule PO Daily  ?Allegra 60 mg capsule Oral  ?Allopurinol 300 MG Oral Tablet Oral  ?CoQ10 CAPS Oral  ?Fish Oil CAPS Oral  ?Furosemide 20 mg tablet  ?Integra Plus 125 mg iron-1 mg capsule Oral  ?Mens Multivitamin Plus TABS Oral  ?Stool Softener 240 mg capsule Oral  ?Vitamin B-12 1000 MCG Oral Tablet Oral  ?Vitamin D3 50 mcg (2,000 unit) tablet Oral  ?Xarelto 20 mg tablet Oral  ?  ? ?GU PSH: Prostate Needle Biopsy - 03/20/2021 ? ?  ?   ?PSH Notes: Pacemaker Placement, Cataract Surgery, Gastric Surgery, Open Fracture Reduction, Hand Surgery   ? ?NON-GU PSH: Surgical Pathology, Gross And Microscopic Examination For Prostate Needle - 03/20/2021 ?Treat Thigh Fracture - 2015 ? ?  ? ?GU PMH: Prostate Cancer - 04/15/2021, - 03/31/2021, - 03/20/2021, - 01/14/2021, - 07/15/2020, - 03/19/2020, - 01/02/2020, - 11/21/2019, - 06/02/2019, - 2020, - 2019, - 2019, - 2018, - 2018, - 2017, Prostate cancer, - 2016 ?BPH w/LUTS - 01/14/2021, Benign prostatic hyperplasia with urinary obstruction, - 2015 ?Weak Urinary Stream - 01/14/2021, - 2019 ?ED due to arterial insufficiency - 2018 ?Elevated PSA - 2018, - 2017, PSA elevation, - 2016 ?History of urolithiasis, History of renal calculi - 2015 ?  ? ?NON-GU PMH: Personal history of other diseases of the circulatory system, History of atrial fibrillation - 2016, History of cardiac arrhythmia, - 2015, History of hypertension, - 2015, History of mitral valve prolapse, - 2015 ?Encounter for general adult medical examination without abnormal findings, Encounter for preventive health examination - 2015 ?Anxiety, Anxiety - 2015 ?Diverticulosis, Diverticulosis - 2015 ?Hyperlipidemia, unspecified, Dyslipidemia - 2015 ?Personal history of diseases of the blood and blood-forming organs and certain disorders involving the immune mechanism, History of iron deficiency anemia - 2015, History of thalassemia, - 2015 ?Personal history of other diseases of the digestive system, History of gastroesophageal reflux (GERD) - 2015, History of gastric ulcer, - 2015 ?Personal history of other diseases of the musculoskeletal system and connective tissue, History of osteoarthritis - 2015, History of gout, - 2015 ?Personal history of other diseases of the nervous system and sense organs, History of sleep apnea - 2015 ?Skin Cancer, History, History of basal cell carcinoma - 2015 ?  ? ?FAMILY HISTORY: Acute Myocardial Infarction - Runs In Family ?CAD (coronary artery disease) - Runs In Family ?Colon Cancer - Runs In Family ?Congestive Heart Failure - Runs In Family ?Gout  - Runs In Family ?Kidney Stones - Runs In Family ?thalassemia - Runs In Family  ? ?SOCIAL HISTORY: Marital Status: Married ?Preferred Language: [place]; Ethnicity: Not Hispanic Or Latino; Race: White ?Current Smoking Status: Patient does not smoke anymore. Has not smoked since 09/05/1969.  ?Has never drank.  ?Drinks 4+ caffeinated drinks per day. ?  ? ?REVIEW OF SYSTEMS:    ?GU Review Male:   Patient denies frequent urination, hard to postpone urination, burning/ pain with urination, get up at night to urinate, leakage of urine, stream starts and stops, trouble starting your stream, have to strain to urinate , erection problems, and penile pain.  ?Gastrointestinal (Upper):   Patient denies nausea, vomiting, and indigestion/ heartburn.  ?Gastrointestinal (Lower):   Patient denies diarrhea and constipation.  ?Constitutional:   Patient denies fever, night sweats, weight loss, and fatigue.  ?Skin:   Patient denies skin rash/ lesion and itching.  ?Eyes:   Patient denies blurred vision and double vision.  ?Ears/ Nose/ Throat:  Patient denies sore throat and sinus problems.  ?Hematologic/Lymphatic:   Patient denies swollen glands and easy bruising.  ?Cardiovascular:   Patient denies leg swelling and chest pains.  ?Respiratory:   Patient denies cough and shortness of breath.  ?Endocrine:   Patient denies excessive thirst.  ?Musculoskeletal:   Patient denies back pain and joint pain.  ?Neurological:   Patient denies headaches and dizziness.  ?Psychologic:   Patient denies depression and anxiety.  ? ?VITAL SIGNS: None  ? ?Complexity of Data:  ?Source Of History:  Patient  ?Lab Test Review:   PSA  ?Records Review:   Pathology Reports, Previous Doctor Records, Previous Patient Records, POC Tool  ?Urine Test Review:   Urinalysis  ? 01/07/21 07/08/20 02/13/20 11/14/19 05/17/19 05/15/19 11/11/18 11/03/18  ?PSA  ?Total PSA 24.00 ng/mL 18.90 ng/mL 15.30 ng/mL 13.40 ng/mL 10.5 ng/dl 10.50 ng/mL 12.153 ng/dl 10.70 ng/mL  ? ? 04/07/16  09/06/15  ?Hormones  ?Testosterone, Total 274.0 pg/dL 414.0 pg/dL  ? ? ?PROCEDURES:    ? ?     Urinalysis ?Dipstick Dipstick Cont'd  ?Color: Yellow Bilirubin: Neg mg/dL  ?Appearance: Clear Ketones: Neg mg/dL

## 2021-07-03 NOTE — Discharge Instructions (Addendum)
For several days the patient: ? ?should increase his fluid intake and limit strenuous activity. ?he might have mild discomfort at the base of his penis or in his rectum. ?he might have blood in his urine or blood in his bowel movements. ? ?For 2-3 months he might have blood in his ejaculate (semen). ? ?Call the office immedicately: ? for blood clots in the urine or bowel movements,  ?difficulty urinating,  ?inability to urinate,  ?urinary retention,  ?painful or frequent urination,  ?fever, chills,  ?nausea, vomiting, ?other illness.  ? ?Resume Xarelto once you are no longer having any blood in your urine or per rectum. ? ? ?[facility]:  [phone]  ? ? ? ?Post Anesthesia Home Care Instructions ? ?Activity: ?Get plenty of rest for the remainder of the day. A responsible individual must stay with you for 24 hours following the procedure.  ?For the next 24 hours, DO NOT: ?-Drive a car ?-Paediatric nurse ?-Drink alcoholic beverages ?-Take any medication unless instructed by your physician ?-Make any legal decisions or sign important papers. ? ?Meals: ?Start with liquid foods such as gelatin or soup. Progress to regular foods as tolerated. Avoid greasy, spicy, heavy foods. If nausea and/or vomiting occur, drink only clear liquids until the nausea and/or vomiting subsides. Call your physician if vomiting continues. ? ?Special Instructions/Symptoms: ?Your throat may feel dry or sore from the anesthesia or the breathing tube placed in your throat during surgery. If this causes discomfort, gargle with warm salt water. The discomfort should disappear within 24 hours. ? ?

## 2021-07-03 NOTE — Op Note (Signed)
Preoperative diagnosis: Clinically localized adenocarcinoma of the prostate  ? ?Postoperative diagnosis: Clinically localized adenocarcinoma of the prostate ? ?Procedure: 1) Placement of fiducial markers into prostate ?                   2) Insertion of SpaceOAR hydrogel  ? ?Surgeon: Louis Meckel, M.D. ? ?Anesthesia: General ? ?EBL: Minimal ? ?Complications: None ? ?Indication: Jacob [name] is a 88 y.o. gentleman with clinically localized prostate cancer. After discussing management options for treatment, he elected to proceed with radiotherapy. He presents today for the above procedures. The potential risks, complications, alternative options, and expected recovery course have been discussed in detail with the patient and he has provided informed consent to proceed. ? ?Description of procedure: The patient was administered preoperative antibiotics, placed in the dorsal lithotomy position, and prepped and draped in the usual sterile fashion. Next, transrectal ultrasonography was utilized to visualize the prostate. Three gold fiducial markers were then placed into the prostate via transperineal needles under ultrasound guidance at the right apex, right base, and left mid gland under direct ultrasound guidance. A site in the midline was then selected on the perineum for placement of an 18 g needle with saline. The needle was advanced above the rectum and below Denonvillier's fascia to the mid gland and confirmed to be in the midline on transverse imaging. One cc of saline was injected confirming appropriate expansion of this space. A total of 5 cc of saline was then injected to open the space further bilaterally. The saline syringe was then removed and the SpaceOAR hydrogel was injected with good distribution bilaterally. He tolerated the procedure well and without complications. He was given a voiding trial prior to discharge from the PACU.  ?

## 2021-07-03 NOTE — Anesthesia Procedure Notes (Signed)
Procedure Name: [place] ?Date/Time: 07/03/2021 8:37 AM ?Performed by: Mechele Claude, CRNA ?Pre-anesthesia Checklist: Patient identified, Emergency Drugs available, Suction available and Patient being monitored ?Oxygen Delivery Method: Simple face mask ?Placement Confirmation: positive ETCO2 and breath sounds checked- equal and bilateral ? ? ? ? ?

## 2021-07-03 NOTE — Transfer of Care (Signed)
Immediate Anesthesia Transfer of Care Note ? ?Patient: Jacob [name] ? ?Procedure(s) Performed: Procedure(s) (LRB): ?GOLD SEED IMPLANT (N/A) ?SPACE OAR INSTILLATION (N/A) ? ?Patient Location: PACU ? ?Anesthesia Type: MAC ? ?Level of Consciousness: awake, alert  and oriented ? ?Airway & Oxygen Therapy: Patient Spontanous Breathing and Patient connected to face mask oxygen ? ?Post-op Assessment: Report given to PACU RN and Post -op Vital signs reviewed and stable ? ?Post vital signs: Reviewed and stable ? ?Complications: No apparent anesthesia complications ? ?Last Vitals:  ?Vitals Value Taken Time  ?BP 103/49 07/03/21 0901  ?Temp    ?Pulse 60 07/03/21 0905  ?Resp 16 07/03/21 0905  ?SpO2 98 % 07/03/21 0905  ?Vitals shown include unvalidated device data. ? ?Last Pain:  ?Vitals:  ? 07/03/21 0730  ?TempSrc: Oral  ?PainSc: 0-No pain  ?   ? ?Patients Stated Pain Goal: 6 (07/03/21 0730) ? ?Complications: No notable events documented. ?

## 2021-07-03 NOTE — Progress Notes (Signed)
Dr. Louis Meckel paged to report patient does not want to be cathed to drain bladder and bladder scan of 17 cc.  Patient and dtr informed that patient was able to urinate 30 cc of clear yellow urine post op. Dtr mentioned staying for a couple of hours to give bladder a chance to fill. I gave them the option of staying or going, with understanding that if patient is unable to urinate they will need to return or seek medical attention. Patient's dtr stated that his wife was a nurse and she will know what to look for, and they opted to go home.  Instructed if he is unable to urinate or feels the need to urinate and is unable, to return or seek medical attention. Verbalized understanding.  ?

## 2021-07-03 NOTE — Interval H&P Note (Signed)
History and Physical Interval Note: ? ?07/03/2021 ?8:12 AM ? ?Jacob [name]  has presented today for surgery, with the diagnosis of prostate cancer.  The various methods of treatment have been discussed with the patient and family. After consideration of risks, benefits and other options for treatment, the patient has consented to  Procedure(s): ?GOLD SEED IMPLANT (N/A) ?SPACE OAR INSTILLATION (N/A) as a surgical intervention.  The patient's history has been reviewed, patient examined, no change in status, stable for surgery.  I have reviewed the patient's chart and labs.  Questions were answered to the patient's satisfaction.   ? ? ?Ardis Hughs ? ? ?

## 2021-07-03 NOTE — Anesthesia Postprocedure Evaluation (Signed)
Anesthesia Post Note ? ?Patient: Jacob [name] ? ?Procedure(s) Performed: GOLD SEED IMPLANT (Prostate) ?SPACE OAR INSTILLATION (Prostate) ? ?  ? ?Patient location during evaluation: PACU ?Anesthesia Type: MAC ?Level of consciousness: awake and alert ?Pain management: pain level controlled ?Vital Signs Assessment: post-procedure vital signs reviewed and stable ?Respiratory status: spontaneous breathing, nonlabored ventilation, respiratory function stable and patient connected to nasal cannula oxygen ?Cardiovascular status: stable and blood pressure returned to baseline ?Postop Assessment: no apparent nausea or vomiting ?Anesthetic complications: no ? ? ?No notable events documented. ? ?Last Vitals:  ?Vitals:  ? 07/03/21 0927 07/03/21 1033  ?BP: 127/61 (!) 125/55  ?Pulse: (!) 59 (!) 58  ?Resp: 14 16  ?Temp:  (!) 36.3 ?C  ?SpO2: 98% 96%  ?  ?Last Pain:  ?Vitals:  ? 07/03/21 1033  ?TempSrc: Oral  ?PainSc: 0-No pain  ? ? ?  ?  ?  ?  ?  ?  ? ?Jacob [name] ? ? ? ? ?

## 2021-07-03 NOTE — Progress Notes (Signed)
Spoke with Dr. Louis Meckel about patient urinating 30 cc clear yellow post op to see if that is sufficient, and to follow up on report of patient potentially needing nystatin cream or something to prevent potential infection. He declined nystatin cream or similar treatment at this time and stated to straight cath patient to drain his bladder if he is agreeable, then discharge home. ?

## 2021-07-04 ENCOUNTER — Encounter (HOSPITAL_BASED_OUTPATIENT_CLINIC_OR_DEPARTMENT_OTHER): Payer: Self-pay | Admitting: Urology

## 2021-07-08 NOTE — Progress Notes (Signed)
?  Radiation Oncology         [phone] ?________________________________ ? ?Name: Jacob [name] MRN: [id number]  ?Date: 07/10/2021  DOB: 1933-07-01 ? ?SIMULATION AND TREATMENT PLANNING NOTE ? ?  ICD-10-CM   ?1. Malignant neoplasm of prostate ([place])  C61   ?  ? ? ?DIAGNOSIS:  88 y.o. gentleman with Stage T1c adenocarcinoma of the prostate with Gleason score of 4+4, and PSA of 24 ? ?NARRATIVE:  The patient was brought to the [facility].  Identity was confirmed.  All relevant records and images related to the planned course of therapy were reviewed.  The patient freely provided informed written consent to proceed with treatment after reviewing the details related to the planned course of therapy. The consent form was witnessed and verified by the simulation staff.  Then, the patient was set-up in a stable reproducible supine position for radiation therapy.  A vacuum lock pillow device was custom fabricated to position his legs in a reproducible immobilized position.  Then, I performed a urethrogram under sterile conditions to identify the prostatic bed.  CT images were obtained.  Surface markings were placed.  The CT images were loaded into the planning software.  Then the prostate bed target, pelvic lymph node target and avoidance structures including the rectum, bladder, bowel and hips were contoured.  Treatment planning then occurred.  The radiation prescription was entered and confirmed.  A total of one complex treatment devices were fabricated. I have requested : Intensity Modulated Radiotherapy (IMRT) is medically necessary for this case for the following reason:  Rectal sparing.. ? ?PLAN:  The patient will receive 45 Gy in 25 fractions of 1.8 Gy, followed by a boost to the prostate to a total dose of 75 Gy with 15 additional fractions of 2 Gy. ? ? ?________________________________ ? ?Sheral Apley Tammi Klippel, M.D. ? ?

## 2021-07-09 ENCOUNTER — Telehealth: Payer: Self-pay | Admitting: *Deleted

## 2021-07-09 NOTE — Telephone Encounter (Signed)
CALLED PATIENT TO REMIND OF SIM APPT. FOR 07-10-21- ARRIVAL TIME- 12::45 PM @ [place], INFORMED PATIENT TO ARRIVE WITH A FULL BLADDER AND AN EMPTY BOWEL, LVM FOR A RETURN CALL ?

## 2021-07-10 ENCOUNTER — Encounter: Payer: Self-pay | Admitting: Internal Medicine

## 2021-07-10 ENCOUNTER — Ambulatory Visit
Admission: RE | Admit: 2021-07-10 | Discharge: 2021-07-10 | Disposition: A | Discharge status: Home / Self Care | Payer: Medicare Other | Source: Ambulatory Visit | Attending: Radiation Oncology | Admitting: Radiation Oncology

## 2021-07-10 DIAGNOSIS — C61 Malignant neoplasm of prostate: Secondary | ICD-10-CM | POA: Diagnosis present

## 2021-07-10 NOTE — Progress Notes (Signed)
TO BE COMPLETED BY RADIATION ONCOLOGIST OFFICE:  ? ? ?Patient Name: Jacob [name]  ? ?Date of Birth: [birth year]  ? ?Radiation Oncologist: Dr. Tyler Pita  ? ?Site to be Treated: Prostate  ? ?Will x-rays >10 MV be used? No  ? ?Will the radiation be >10 cm from the device? Yes  ? ?Planned Treatment Start Date: 1 week ? ? ?TO BE COMPLETED BY CARDIOLOGIST OFFICE:  ? ?Device Information:  Pacemaker '[x]'$      ICD '[]'$   ? ?Brand: Medtronic: [id number] Model #: [id number] Advisa DR MRI ? ?Serial Number: [id number]     Date of Placement: 09/05/2014 ? ?Site of Placement: Left Chest ? ?Remote Device Check--Frequency: Q 3 months   Last Check: 05/02/21 ? ?Is the Patient Pacer Dependent?:  Yes '[]'$   No '[x]'$  ? ?Does cardiologist request Radiation Oncology to schedule device testing by vendor for the following:  ?Prior to the Initiation of Treatments?  Yes '[]'$  No '[x]'$  ?During Treatments?  Yes '[]'$  No '[x]'$  ?Post Radiation Treatments?  Yes '[]'$  No '[x]'$  ? ?Is device monitoring necessary by vendor/cardiologist team during treatments?  ?Yes '[]'$   No '[x]'$  ? ?Is cardiac monitoring by Radiation Oncology nursing necessary during treatments? ?Yes '[]'$   No '[x]'$  ? ?Do you recommend device be relocated prior to Radiation Treatment? ?Yes '[]'$   No '[x]'$  ? ?**PLEASE LIST ANY NOTES OR SPECIAL REQUESTS: ? ? ? ?  ? ?CARDIOLOGIST SIGNATURE:  Dr. Cristopher Peru ?Per Device Clinic Standing Orders, ?Diahn Waidelich [name]  ?07/10/2021 ?4:50 PM ? ?**Please route completed form back to Radiation Oncology Nursing and "P [facility] RAD ONC ADMIN", OR send an update if there will be a delay in having form completed by expected start date.  ?**Call [phone] if you have any questions or do not get an in-basket response from a Radiation Oncology staff member  ? ? ? ? ? ? ? ? ? ? ? ? ?

## 2021-07-16 DIAGNOSIS — C61 Malignant neoplasm of prostate: Secondary | ICD-10-CM | POA: Diagnosis present

## 2021-07-17 ENCOUNTER — Telehealth: Payer: Self-pay | Admitting: Internal Medicine

## 2021-07-17 NOTE — Telephone Encounter (Signed)
?   Jacob [name] ?DOB: 03-Mar-1934 ?MRN: [id number]  ? ?RIDER WAIVER AND RELEASE OF LIABILITY ? ?For purposes of improving physical access to our facilities, [facility] is pleased to partner with third parties to provide [facility] patients or other authorized individuals the option of convenient, on-demand ground transportation services (the ?[employer]?) through use of the technology service that enables users to request on-demand ground transportation from independent third-party providers. ? ?By opting to use and accept these [employer], I, the undersigned, hereby agree on behalf of myself, and on behalf of any minor child using the Government social research officer for whom I am the parent or legal guardian, as follows: ? ?Government social research officer provided to me are provided by independent third-party transportation providers who are not [employer] or employees and who are unaffiliated with [employer]. ?[facility] is neither a transportation carrier nor a common or public carrier. ?[facility] has no control over the quality or safety of the transportation that occurs as a result of the [employer]. ?[facility] cannot guarantee that any third-party transportation provider will complete any arranged transportation service. ?[facility] makes no representation, warranty, or guarantee regarding the reliability, timeliness, quality, safety, suitability, or availability of any of the Transport Services or that they will be error free. ?I fully understand that traveling by vehicle involves risks and dangers of serious bodily injury, including permanent disability, paralysis, and death. I agree, on behalf of myself and on behalf of any minor child using the Transport Services for whom I am the parent or legal guardian, that the entire risk arising out of my use of the [employer] remains solely with me, to the maximum extent permitted under applicable law. ?The [employer] are provided  ?as is? and ?as available.? [facility] disclaims all representations and warranties, express, implied or statutory, not expressly set out in these terms, including the implied warranties of merchantability and fitness for a particular purpose. ?I hereby waive and release [facility], its agents, employees, officers, directors, representatives, insurers, attorneys, assigns, successors, subsidiaries, and affiliates from any and all past, present, or future claims, demands, liabilities, actions, causes of action, or suits of any kind directly or indirectly arising from acceptance and use of the [employer]. ?I further waive and release [facility] and its affiliates from all present and future liability and responsibility for any injury or death to persons or damages to property caused by or related to the use of the [employer]. ?I have read this Waiver and Release of Liability, and I understand the terms used in it and their legal significance. This Waiver is freely and voluntarily given with the understanding that my right (as well as the right of any minor child for whom I am the parent or legal guardian using the [employer]) to legal recourse against [facility] in connection with the [employer] is knowingly surrendered in return for use of these services. ? ? ?I attest that I read the consent document to Oren Section, gave Mr. Macauley the opportunity to ask questions and answered the questions asked (if any). I affirm that Oren Section then provided consent for he's participation in this program.   ?  ?Jacob [name] ? ?                          ?

## 2021-07-21 ENCOUNTER — Other Ambulatory Visit: Payer: Self-pay

## 2021-07-21 ENCOUNTER — Ambulatory Visit
Admission: RE | Admit: 2021-07-21 | Discharge: 2021-07-21 | Disposition: A | Discharge status: Home / Self Care | Payer: Medicare Other | Source: Ambulatory Visit | Attending: Radiation Oncology | Admitting: Radiation Oncology

## 2021-07-21 DIAGNOSIS — C61 Malignant neoplasm of prostate: Secondary | ICD-10-CM | POA: Diagnosis not present

## 2021-07-21 LAB — RAD ONC ARIA SESSION SUMMARY
Course Elapsed Days: 0
Plan Fractions Treated to Date: 1
Plan Prescribed Dose Per Fraction: 1.8 Gy
Plan Total Fractions Prescribed: 25
Plan Total Prescribed Dose: 45 Gy
Reference Point Dosage Given to Date: 1.8 Gy
Reference Point Session Dosage Given: 1.8 Gy
Session Number: 1

## 2021-07-22 ENCOUNTER — Ambulatory Visit
Admission: RE | Admit: 2021-07-22 | Discharge: 2021-07-22 | Disposition: A | Discharge status: Home / Self Care | Payer: Medicare Other | Source: Ambulatory Visit | Attending: Radiation Oncology | Admitting: Radiation Oncology

## 2021-07-22 ENCOUNTER — Other Ambulatory Visit: Payer: Self-pay

## 2021-07-22 ENCOUNTER — Inpatient Hospital Stay: Payer: Medicare Other | Attending: Radiation Oncology

## 2021-07-22 DIAGNOSIS — C61 Malignant neoplasm of prostate: Secondary | ICD-10-CM | POA: Diagnosis not present

## 2021-07-22 LAB — RAD ONC ARIA SESSION SUMMARY
Course Elapsed Days: 1
Plan Fractions Treated to Date: 2
Plan Prescribed Dose Per Fraction: 1.8 Gy
Plan Total Fractions Prescribed: 25
Plan Total Prescribed Dose: 45 Gy
Reference Point Dosage Given to Date: 3.6 Gy
Reference Point Session Dosage Given: 1.8 Gy
Session Number: 2

## 2021-07-23 ENCOUNTER — Inpatient Hospital Stay: Payer: Medicare Other

## 2021-07-23 ENCOUNTER — Ambulatory Visit
Admission: RE | Admit: 2021-07-23 | Discharge: 2021-07-23 | Disposition: A | Discharge status: Home / Self Care | Payer: Medicare Other | Source: Ambulatory Visit | Attending: Radiation Oncology | Admitting: Radiation Oncology

## 2021-07-23 ENCOUNTER — Other Ambulatory Visit: Payer: Self-pay

## 2021-07-23 DIAGNOSIS — C61 Malignant neoplasm of prostate: Secondary | ICD-10-CM | POA: Diagnosis not present

## 2021-07-23 LAB — RAD ONC ARIA SESSION SUMMARY
Course Elapsed Days: 2
Plan Fractions Treated to Date: 3
Plan Prescribed Dose Per Fraction: 1.8 Gy
Plan Total Fractions Prescribed: 25
Plan Total Prescribed Dose: 45 Gy
Reference Point Dosage Given to Date: 5.4 Gy
Reference Point Session Dosage Given: 1.8 Gy
Session Number: 3

## 2021-07-24 ENCOUNTER — Ambulatory Visit
Admission: RE | Admit: 2021-07-24 | Discharge: 2021-07-24 | Disposition: A | Discharge status: Home / Self Care | Payer: Medicare Other | Source: Ambulatory Visit | Attending: Radiation Oncology | Admitting: Radiation Oncology

## 2021-07-24 ENCOUNTER — Other Ambulatory Visit: Payer: Self-pay

## 2021-07-24 ENCOUNTER — Inpatient Hospital Stay: Payer: Medicare Other

## 2021-07-24 DIAGNOSIS — C61 Malignant neoplasm of prostate: Secondary | ICD-10-CM | POA: Diagnosis not present

## 2021-07-24 LAB — RAD ONC ARIA SESSION SUMMARY
Course Elapsed Days: 3
Plan Fractions Treated to Date: 4
Plan Prescribed Dose Per Fraction: 1.8 Gy
Plan Total Fractions Prescribed: 25
Plan Total Prescribed Dose: 45 Gy
Reference Point Dosage Given to Date: 7.2 Gy
Reference Point Session Dosage Given: 1.8 Gy
Session Number: 4

## 2021-07-25 ENCOUNTER — Inpatient Hospital Stay: Payer: Medicare Other

## 2021-07-25 ENCOUNTER — Other Ambulatory Visit: Payer: Self-pay

## 2021-07-25 ENCOUNTER — Ambulatory Visit
Admission: RE | Admit: 2021-07-25 | Discharge: 2021-07-25 | Disposition: A | Discharge status: Home / Self Care | Payer: Medicare Other | Source: Ambulatory Visit | Attending: Radiation Oncology | Admitting: Radiation Oncology

## 2021-07-25 DIAGNOSIS — C61 Malignant neoplasm of prostate: Secondary | ICD-10-CM | POA: Diagnosis not present

## 2021-07-25 LAB — RAD ONC ARIA SESSION SUMMARY
Course Elapsed Days: 4
Plan Fractions Treated to Date: 5
Plan Prescribed Dose Per Fraction: 1.8 Gy
Plan Total Fractions Prescribed: 25
Plan Total Prescribed Dose: 45 Gy
Reference Point Dosage Given to Date: 9 Gy
Reference Point Session Dosage Given: 1.8 Gy
Session Number: 5

## 2021-07-28 ENCOUNTER — Other Ambulatory Visit: Payer: Self-pay

## 2021-07-28 ENCOUNTER — Ambulatory Visit
Admission: RE | Admit: 2021-07-28 | Discharge: 2021-07-28 | Disposition: A | Discharge status: Home / Self Care | Payer: Medicare Other | Source: Ambulatory Visit | Attending: Radiation Oncology | Admitting: Radiation Oncology

## 2021-07-28 DIAGNOSIS — C61 Malignant neoplasm of prostate: Secondary | ICD-10-CM | POA: Diagnosis not present

## 2021-07-28 LAB — RAD ONC ARIA SESSION SUMMARY
Course Elapsed Days: 7
Plan Fractions Treated to Date: 6
Plan Prescribed Dose Per Fraction: 1.8 Gy
Plan Total Fractions Prescribed: 25
Plan Total Prescribed Dose: 45 Gy
Reference Point Dosage Given to Date: 10.8 Gy
Reference Point Session Dosage Given: 1.8 Gy
Session Number: 6

## 2021-07-29 ENCOUNTER — Other Ambulatory Visit: Payer: Self-pay

## 2021-07-29 ENCOUNTER — Ambulatory Visit
Admission: RE | Admit: 2021-07-29 | Discharge: 2021-07-29 | Disposition: A | Discharge status: Home / Self Care | Payer: Medicare Other | Source: Ambulatory Visit | Attending: Radiation Oncology | Admitting: Radiation Oncology

## 2021-07-29 DIAGNOSIS — C61 Malignant neoplasm of prostate: Secondary | ICD-10-CM | POA: Diagnosis not present

## 2021-07-29 LAB — RAD ONC ARIA SESSION SUMMARY
Course Elapsed Days: 8
Plan Fractions Treated to Date: 7
Plan Prescribed Dose Per Fraction: 1.8 Gy
Plan Total Fractions Prescribed: 25
Plan Total Prescribed Dose: 45 Gy
Reference Point Dosage Given to Date: 12.6 Gy
Reference Point Session Dosage Given: 1.8 Gy
Session Number: 7

## 2021-07-30 ENCOUNTER — Ambulatory Visit
Admission: RE | Admit: 2021-07-30 | Discharge: 2021-07-30 | Disposition: A | Discharge status: Home / Self Care | Payer: Medicare Other | Source: Ambulatory Visit | Attending: Radiation Oncology | Admitting: Radiation Oncology

## 2021-07-30 ENCOUNTER — Other Ambulatory Visit: Payer: Self-pay

## 2021-07-30 DIAGNOSIS — C61 Malignant neoplasm of prostate: Secondary | ICD-10-CM | POA: Diagnosis not present

## 2021-07-30 LAB — RAD ONC ARIA SESSION SUMMARY
Course Elapsed Days: 9
Plan Fractions Treated to Date: 8
Plan Prescribed Dose Per Fraction: 1.8 Gy
Plan Total Fractions Prescribed: 25
Plan Total Prescribed Dose: 45 Gy
Reference Point Dosage Given to Date: 14.4 Gy
Reference Point Session Dosage Given: 1.8 Gy
Session Number: 8

## 2021-07-31 ENCOUNTER — Inpatient Hospital Stay: Payer: Medicare Other

## 2021-07-31 ENCOUNTER — Other Ambulatory Visit: Payer: Self-pay

## 2021-07-31 ENCOUNTER — Ambulatory Visit
Admission: RE | Admit: 2021-07-31 | Discharge: 2021-07-31 | Disposition: A | Discharge status: Home / Self Care | Payer: Medicare Other | Source: Ambulatory Visit | Attending: Radiation Oncology | Admitting: Radiation Oncology

## 2021-07-31 ENCOUNTER — Ambulatory Visit (INDEPENDENT_AMBULATORY_CARE_PROVIDER_SITE_OTHER): Payer: Medicare Other

## 2021-07-31 DIAGNOSIS — I442 Atrioventricular block, complete: Secondary | ICD-10-CM | POA: Diagnosis not present

## 2021-07-31 DIAGNOSIS — C61 Malignant neoplasm of prostate: Secondary | ICD-10-CM | POA: Diagnosis not present

## 2021-07-31 LAB — RAD ONC ARIA SESSION SUMMARY
Course Elapsed Days: 10
Plan Fractions Treated to Date: 9
Plan Prescribed Dose Per Fraction: 1.8 Gy
Plan Total Fractions Prescribed: 25
Plan Total Prescribed Dose: 45 Gy
Reference Point Dosage Given to Date: 16.2 Gy
Reference Point Session Dosage Given: 1.8 Gy
Session Number: 9

## 2021-08-01 ENCOUNTER — Other Ambulatory Visit: Payer: Self-pay

## 2021-08-01 ENCOUNTER — Ambulatory Visit
Admission: RE | Admit: 2021-08-01 | Discharge: 2021-08-01 | Disposition: A | Discharge status: Home / Self Care | Payer: Medicare Other | Source: Ambulatory Visit | Attending: Radiation Oncology | Admitting: Radiation Oncology

## 2021-08-01 DIAGNOSIS — C61 Malignant neoplasm of prostate: Secondary | ICD-10-CM | POA: Diagnosis not present

## 2021-08-01 LAB — RAD ONC ARIA SESSION SUMMARY
Course Elapsed Days: 11
Plan Fractions Treated to Date: 10
Plan Prescribed Dose Per Fraction: 1.8 Gy
Plan Total Fractions Prescribed: 25
Plan Total Prescribed Dose: 45 Gy
Reference Point Dosage Given to Date: 18 Gy
Reference Point Session Dosage Given: 1.8 Gy
Session Number: 10

## 2021-08-03 LAB — CUP PACEART REMOTE DEVICE CHECK
Battery Remaining Longevity: 12 mo
Battery Voltage: 2.89 V
Brady Statistic RA Percent Paced: 0.09 %
Brady Statistic RV Percent Paced: 97.64 %
Date Time Interrogation Session: 20230518163750
Implantable Lead Implant Date: 20160622
Implantable Lead Implant Date: 20160622
Implantable Lead Location: 753859
Implantable Lead Location: 753860
Implantable Lead Model: 5076
Implantable Lead Model: 5076
Implantable Pulse Generator Implant Date: 20160622
Lead Channel Impedance Value: 323 Ohm
Lead Channel Impedance Value: 323 Ohm
Lead Channel Impedance Value: 418 Ohm
Lead Channel Impedance Value: 437 Ohm
Lead Channel Pacing Threshold Amplitude: 0.625 V
Lead Channel Pacing Threshold Amplitude: 0.875 V
Lead Channel Pacing Threshold Pulse Width: 0.4 ms
Lead Channel Pacing Threshold Pulse Width: 0.4 ms
Lead Channel Sensing Intrinsic Amplitude: 1.625 mV
Lead Channel Sensing Intrinsic Amplitude: 1.625 mV
Lead Channel Sensing Intrinsic Amplitude: 9.75 mV
Lead Channel Sensing Intrinsic Amplitude: 9.75 mV
Lead Channel Setting Pacing Amplitude: 2 V
Lead Channel Setting Pacing Amplitude: 2.5 V
Lead Channel Setting Pacing Pulse Width: 0.4 ms
Lead Channel Setting Sensing Sensitivity: 4 mV

## 2021-08-04 ENCOUNTER — Other Ambulatory Visit: Payer: Self-pay

## 2021-08-04 ENCOUNTER — Ambulatory Visit
Admission: RE | Admit: 2021-08-04 | Discharge: 2021-08-04 | Disposition: A | Discharge status: Home / Self Care | Payer: Medicare Other | Source: Ambulatory Visit | Attending: Radiation Oncology | Admitting: Radiation Oncology

## 2021-08-04 ENCOUNTER — Inpatient Hospital Stay: Payer: Medicare Other

## 2021-08-04 DIAGNOSIS — C61 Malignant neoplasm of prostate: Secondary | ICD-10-CM | POA: Diagnosis not present

## 2021-08-04 LAB — RAD ONC ARIA SESSION SUMMARY
Course Elapsed Days: 14
Plan Fractions Treated to Date: 11
Plan Prescribed Dose Per Fraction: 1.8 Gy
Plan Total Fractions Prescribed: 25
Plan Total Prescribed Dose: 45 Gy
Reference Point Dosage Given to Date: 19.8 Gy
Reference Point Session Dosage Given: 1.8 Gy
Session Number: 11

## 2021-08-05 ENCOUNTER — Inpatient Hospital Stay: Payer: Medicare Other

## 2021-08-05 ENCOUNTER — Ambulatory Visit
Admission: RE | Admit: 2021-08-05 | Discharge: 2021-08-05 | Disposition: A | Discharge status: Home / Self Care | Payer: Medicare Other | Source: Ambulatory Visit | Attending: Radiation Oncology | Admitting: Radiation Oncology

## 2021-08-05 ENCOUNTER — Other Ambulatory Visit: Payer: Self-pay

## 2021-08-05 DIAGNOSIS — C61 Malignant neoplasm of prostate: Secondary | ICD-10-CM | POA: Diagnosis not present

## 2021-08-05 LAB — RAD ONC ARIA SESSION SUMMARY
Course Elapsed Days: 15
Plan Fractions Treated to Date: 12
Plan Prescribed Dose Per Fraction: 1.8 Gy
Plan Total Fractions Prescribed: 25
Plan Total Prescribed Dose: 45 Gy
Reference Point Dosage Given to Date: 21.6 Gy
Reference Point Session Dosage Given: 1.8 Gy
Session Number: 12

## 2021-08-06 ENCOUNTER — Other Ambulatory Visit: Payer: Self-pay

## 2021-08-06 ENCOUNTER — Ambulatory Visit
Admission: RE | Admit: 2021-08-06 | Discharge: 2021-08-06 | Disposition: A | Discharge status: Home / Self Care | Payer: Medicare Other | Source: Ambulatory Visit | Attending: Radiation Oncology | Admitting: Radiation Oncology

## 2021-08-06 ENCOUNTER — Inpatient Hospital Stay: Payer: Medicare Other

## 2021-08-06 DIAGNOSIS — C61 Malignant neoplasm of prostate: Secondary | ICD-10-CM | POA: Diagnosis not present

## 2021-08-06 LAB — RAD ONC ARIA SESSION SUMMARY
Course Elapsed Days: 16
Plan Fractions Treated to Date: 13
Plan Prescribed Dose Per Fraction: 1.8 Gy
Plan Total Fractions Prescribed: 25
Plan Total Prescribed Dose: 45 Gy
Reference Point Dosage Given to Date: 23.4 Gy
Reference Point Session Dosage Given: 1.8 Gy
Session Number: 13

## 2021-08-07 ENCOUNTER — Other Ambulatory Visit: Payer: Self-pay

## 2021-08-07 ENCOUNTER — Inpatient Hospital Stay: Payer: Medicare Other

## 2021-08-07 ENCOUNTER — Ambulatory Visit
Admission: RE | Admit: 2021-08-07 | Discharge: 2021-08-07 | Disposition: A | Discharge status: Home / Self Care | Payer: Medicare Other | Source: Ambulatory Visit | Attending: Radiation Oncology | Admitting: Radiation Oncology

## 2021-08-07 DIAGNOSIS — C61 Malignant neoplasm of prostate: Secondary | ICD-10-CM | POA: Diagnosis not present

## 2021-08-07 LAB — RAD ONC ARIA SESSION SUMMARY
Course Elapsed Days: 17
Plan Fractions Treated to Date: 14
Plan Prescribed Dose Per Fraction: 1.8 Gy
Plan Total Fractions Prescribed: 25
Plan Total Prescribed Dose: 45 Gy
Reference Point Dosage Given to Date: 25.2 Gy
Reference Point Session Dosage Given: 1.8 Gy
Session Number: 14

## 2021-08-08 ENCOUNTER — Other Ambulatory Visit: Payer: Self-pay

## 2021-08-08 ENCOUNTER — Inpatient Hospital Stay: Payer: Medicare Other

## 2021-08-08 ENCOUNTER — Ambulatory Visit
Admission: RE | Admit: 2021-08-08 | Discharge: 2021-08-08 | Disposition: A | Discharge status: Home / Self Care | Payer: Medicare Other | Source: Ambulatory Visit | Attending: Radiation Oncology | Admitting: Radiation Oncology

## 2021-08-08 DIAGNOSIS — C61 Malignant neoplasm of prostate: Secondary | ICD-10-CM | POA: Diagnosis not present

## 2021-08-08 LAB — RAD ONC ARIA SESSION SUMMARY
Course Elapsed Days: 18
Plan Fractions Treated to Date: 15
Plan Prescribed Dose Per Fraction: 1.8 Gy
Plan Total Fractions Prescribed: 25
Plan Total Prescribed Dose: 45 Gy
Reference Point Dosage Given to Date: 27 Gy
Reference Point Session Dosage Given: 1.8 Gy
Session Number: 15

## 2021-08-08 NOTE — Progress Notes (Signed)
Remote pacemaker transmission.   

## 2021-08-12 ENCOUNTER — Other Ambulatory Visit: Payer: Self-pay

## 2021-08-12 ENCOUNTER — Inpatient Hospital Stay: Payer: Medicare Other

## 2021-08-12 ENCOUNTER — Ambulatory Visit
Admission: RE | Admit: 2021-08-12 | Discharge: 2021-08-12 | Disposition: A | Discharge status: Home / Self Care | Payer: Medicare Other | Source: Ambulatory Visit | Attending: Radiation Oncology | Admitting: Radiation Oncology

## 2021-08-12 DIAGNOSIS — C61 Malignant neoplasm of prostate: Secondary | ICD-10-CM | POA: Diagnosis not present

## 2021-08-12 LAB — RAD ONC ARIA SESSION SUMMARY
Course Elapsed Days: 22
Plan Fractions Treated to Date: 16
Plan Prescribed Dose Per Fraction: 1.8 Gy
Plan Total Fractions Prescribed: 25
Plan Total Prescribed Dose: 45 Gy
Reference Point Dosage Given to Date: 28.8 Gy
Reference Point Session Dosage Given: 1.8 Gy
Session Number: 16

## 2021-08-13 ENCOUNTER — Other Ambulatory Visit: Payer: Self-pay

## 2021-08-13 ENCOUNTER — Inpatient Hospital Stay: Payer: Medicare Other

## 2021-08-13 ENCOUNTER — Ambulatory Visit
Admission: RE | Admit: 2021-08-13 | Discharge: 2021-08-13 | Disposition: A | Discharge status: Home / Self Care | Payer: Medicare Other | Source: Ambulatory Visit | Attending: Radiation Oncology | Admitting: Radiation Oncology

## 2021-08-13 DIAGNOSIS — C61 Malignant neoplasm of prostate: Secondary | ICD-10-CM | POA: Diagnosis not present

## 2021-08-13 LAB — RAD ONC ARIA SESSION SUMMARY
Course Elapsed Days: 23
Plan Fractions Treated to Date: 17
Plan Prescribed Dose Per Fraction: 1.8 Gy
Plan Total Fractions Prescribed: 25
Plan Total Prescribed Dose: 45 Gy
Reference Point Dosage Given to Date: 30.6 Gy
Reference Point Session Dosage Given: 1.8 Gy
Session Number: 17

## 2021-08-14 ENCOUNTER — Ambulatory Visit
Admission: RE | Admit: 2021-08-14 | Discharge: 2021-08-14 | Disposition: A | Discharge status: Home / Self Care | Payer: Medicare Other | Source: Ambulatory Visit | Attending: Radiation Oncology | Admitting: Radiation Oncology

## 2021-08-14 ENCOUNTER — Inpatient Hospital Stay: Payer: Medicare Other | Attending: Radiation Oncology

## 2021-08-14 ENCOUNTER — Other Ambulatory Visit: Payer: Self-pay

## 2021-08-14 DIAGNOSIS — C61 Malignant neoplasm of prostate: Secondary | ICD-10-CM | POA: Diagnosis present

## 2021-08-14 LAB — RAD ONC ARIA SESSION SUMMARY
Course Elapsed Days: 24
Plan Fractions Treated to Date: 18
Plan Prescribed Dose Per Fraction: 1.8 Gy
Plan Total Fractions Prescribed: 25
Plan Total Prescribed Dose: 45 Gy
Reference Point Dosage Given to Date: 32.4 Gy
Reference Point Session Dosage Given: 1.8 Gy
Session Number: 18

## 2021-08-15 ENCOUNTER — Ambulatory Visit
Admission: RE | Admit: 2021-08-15 | Discharge: 2021-08-15 | Disposition: A | Discharge status: Home / Self Care | Payer: Medicare Other | Source: Ambulatory Visit | Attending: Radiation Oncology | Admitting: Radiation Oncology

## 2021-08-15 ENCOUNTER — Inpatient Hospital Stay: Payer: Medicare Other

## 2021-08-15 ENCOUNTER — Other Ambulatory Visit: Payer: Self-pay

## 2021-08-15 DIAGNOSIS — C61 Malignant neoplasm of prostate: Secondary | ICD-10-CM | POA: Diagnosis not present

## 2021-08-15 LAB — RAD ONC ARIA SESSION SUMMARY
Course Elapsed Days: 25
Plan Fractions Treated to Date: 19
Plan Prescribed Dose Per Fraction: 1.8 Gy
Plan Total Fractions Prescribed: 25
Plan Total Prescribed Dose: 45 Gy
Reference Point Dosage Given to Date: 34.2 Gy
Reference Point Session Dosage Given: 1.8 Gy
Session Number: 19

## 2021-08-18 ENCOUNTER — Ambulatory Visit
Admission: RE | Admit: 2021-08-18 | Discharge: 2021-08-18 | Disposition: A | Discharge status: Home / Self Care | Payer: Medicare Other | Source: Ambulatory Visit | Attending: Radiation Oncology | Admitting: Radiation Oncology

## 2021-08-18 ENCOUNTER — Other Ambulatory Visit: Payer: Self-pay

## 2021-08-18 ENCOUNTER — Inpatient Hospital Stay: Payer: Medicare Other

## 2021-08-18 DIAGNOSIS — C61 Malignant neoplasm of prostate: Secondary | ICD-10-CM | POA: Diagnosis not present

## 2021-08-18 LAB — RAD ONC ARIA SESSION SUMMARY
Course Elapsed Days: 28
Plan Fractions Treated to Date: 20
Plan Prescribed Dose Per Fraction: 1.8 Gy
Plan Total Fractions Prescribed: 25
Plan Total Prescribed Dose: 45 Gy
Reference Point Dosage Given to Date: 36 Gy
Reference Point Session Dosage Given: 1.8 Gy
Session Number: 20

## 2021-08-19 ENCOUNTER — Ambulatory Visit
Admission: RE | Admit: 2021-08-19 | Discharge: 2021-08-19 | Disposition: A | Discharge status: Home / Self Care | Payer: Medicare Other | Source: Ambulatory Visit | Attending: Radiation Oncology | Admitting: Radiation Oncology

## 2021-08-19 ENCOUNTER — Inpatient Hospital Stay: Payer: Medicare Other

## 2021-08-19 ENCOUNTER — Other Ambulatory Visit: Payer: Self-pay

## 2021-08-19 DIAGNOSIS — C61 Malignant neoplasm of prostate: Secondary | ICD-10-CM | POA: Diagnosis not present

## 2021-08-19 LAB — RAD ONC ARIA SESSION SUMMARY
Course Elapsed Days: 29
Plan Fractions Treated to Date: 21
Plan Prescribed Dose Per Fraction: 1.8 Gy
Plan Total Fractions Prescribed: 25
Plan Total Prescribed Dose: 45 Gy
Reference Point Dosage Given to Date: 37.8 Gy
Reference Point Session Dosage Given: 1.8 Gy
Session Number: 21

## 2021-08-20 ENCOUNTER — Ambulatory Visit
Admission: RE | Admit: 2021-08-20 | Discharge: 2021-08-20 | Disposition: A | Discharge status: Home / Self Care | Payer: Medicare Other | Source: Ambulatory Visit | Attending: Radiation Oncology | Admitting: Radiation Oncology

## 2021-08-20 ENCOUNTER — Other Ambulatory Visit: Payer: Self-pay

## 2021-08-20 ENCOUNTER — Inpatient Hospital Stay: Payer: Medicare Other

## 2021-08-20 DIAGNOSIS — C61 Malignant neoplasm of prostate: Secondary | ICD-10-CM | POA: Diagnosis not present

## 2021-08-20 LAB — RAD ONC ARIA SESSION SUMMARY
Course Elapsed Days: 30
Plan Fractions Treated to Date: 22
Plan Prescribed Dose Per Fraction: 1.8 Gy
Plan Total Fractions Prescribed: 25
Plan Total Prescribed Dose: 45 Gy
Reference Point Dosage Given to Date: 39.6 Gy
Reference Point Session Dosage Given: 1.8 Gy
Session Number: 22

## 2021-08-21 ENCOUNTER — Ambulatory Visit
Admission: RE | Admit: 2021-08-21 | Discharge: 2021-08-21 | Disposition: A | Discharge status: Home / Self Care | Payer: Medicare Other | Source: Ambulatory Visit | Attending: Radiation Oncology | Admitting: Radiation Oncology

## 2021-08-21 ENCOUNTER — Other Ambulatory Visit: Payer: Self-pay

## 2021-08-21 ENCOUNTER — Inpatient Hospital Stay: Payer: Medicare Other

## 2021-08-21 DIAGNOSIS — C61 Malignant neoplasm of prostate: Secondary | ICD-10-CM | POA: Diagnosis not present

## 2021-08-21 LAB — RAD ONC ARIA SESSION SUMMARY
Course Elapsed Days: 31
Plan Fractions Treated to Date: 23
Plan Prescribed Dose Per Fraction: 1.8 Gy
Plan Total Fractions Prescribed: 25
Plan Total Prescribed Dose: 45 Gy
Reference Point Dosage Given to Date: 41.4 Gy
Reference Point Session Dosage Given: 1.8 Gy
Session Number: 23

## 2021-08-22 ENCOUNTER — Other Ambulatory Visit: Payer: Self-pay

## 2021-08-22 ENCOUNTER — Inpatient Hospital Stay: Payer: Medicare Other

## 2021-08-22 ENCOUNTER — Ambulatory Visit
Admission: RE | Admit: 2021-08-22 | Discharge: 2021-08-22 | Disposition: A | Discharge status: Home / Self Care | Payer: Medicare Other | Source: Ambulatory Visit | Attending: Radiation Oncology | Admitting: Radiation Oncology

## 2021-08-22 DIAGNOSIS — C61 Malignant neoplasm of prostate: Secondary | ICD-10-CM | POA: Diagnosis not present

## 2021-08-22 LAB — RAD ONC ARIA SESSION SUMMARY
Course Elapsed Days: 32
Plan Fractions Treated to Date: 24
Plan Prescribed Dose Per Fraction: 1.8 Gy
Plan Total Fractions Prescribed: 25
Plan Total Prescribed Dose: 45 Gy
Reference Point Dosage Given to Date: 43.2 Gy
Reference Point Session Dosage Given: 1.8 Gy
Session Number: 24

## 2021-08-25 ENCOUNTER — Other Ambulatory Visit: Payer: Self-pay

## 2021-08-25 ENCOUNTER — Ambulatory Visit
Admission: RE | Admit: 2021-08-25 | Discharge: 2021-08-25 | Disposition: A | Discharge status: Home / Self Care | Payer: Medicare Other | Source: Ambulatory Visit | Attending: Radiation Oncology | Admitting: Radiation Oncology

## 2021-08-25 ENCOUNTER — Inpatient Hospital Stay: Payer: Medicare Other

## 2021-08-25 DIAGNOSIS — C61 Malignant neoplasm of prostate: Secondary | ICD-10-CM | POA: Diagnosis not present

## 2021-08-25 LAB — RAD ONC ARIA SESSION SUMMARY
Course Elapsed Days: 35
Plan Fractions Treated to Date: 25
Plan Prescribed Dose Per Fraction: 1.8 Gy
Plan Total Fractions Prescribed: 25
Plan Total Prescribed Dose: 45 Gy
Reference Point Dosage Given to Date: 45 Gy
Reference Point Session Dosage Given: 1.8 Gy
Session Number: 25

## 2021-08-26 ENCOUNTER — Ambulatory Visit: Payer: Medicare Other

## 2021-08-26 ENCOUNTER — Other Ambulatory Visit: Payer: Self-pay

## 2021-08-26 ENCOUNTER — Inpatient Hospital Stay: Payer: Medicare Other

## 2021-08-26 DIAGNOSIS — C61 Malignant neoplasm of prostate: Secondary | ICD-10-CM | POA: Diagnosis not present

## 2021-08-26 LAB — RAD ONC ARIA SESSION SUMMARY
Course Elapsed Days: 36
Plan Fractions Treated to Date: 1
Plan Prescribed Dose Per Fraction: 2 Gy
Plan Total Fractions Prescribed: 15
Plan Total Prescribed Dose: 30 Gy
Reference Point Dosage Given to Date: 47 Gy
Reference Point Session Dosage Given: 2 Gy
Session Number: 26

## 2021-08-27 ENCOUNTER — Inpatient Hospital Stay: Payer: Medicare Other

## 2021-08-27 ENCOUNTER — Other Ambulatory Visit: Payer: Self-pay

## 2021-08-27 ENCOUNTER — Ambulatory Visit: Payer: Medicare Other

## 2021-08-27 DIAGNOSIS — C61 Malignant neoplasm of prostate: Secondary | ICD-10-CM | POA: Diagnosis not present

## 2021-08-27 LAB — RAD ONC ARIA SESSION SUMMARY
Course Elapsed Days: 37
Plan Fractions Treated to Date: 2
Plan Prescribed Dose Per Fraction: 2 Gy
Plan Total Fractions Prescribed: 15
Plan Total Prescribed Dose: 30 Gy
Reference Point Dosage Given to Date: 49 Gy
Reference Point Session Dosage Given: 2 Gy
Session Number: 27

## 2021-08-28 ENCOUNTER — Other Ambulatory Visit: Payer: Self-pay

## 2021-08-28 ENCOUNTER — Inpatient Hospital Stay: Payer: Medicare Other

## 2021-08-28 ENCOUNTER — Ambulatory Visit
Admission: RE | Admit: 2021-08-28 | Discharge: 2021-08-28 | Disposition: A | Discharge status: Home / Self Care | Payer: Medicare Other | Source: Ambulatory Visit | Attending: Radiation Oncology | Admitting: Radiation Oncology

## 2021-08-28 DIAGNOSIS — C61 Malignant neoplasm of prostate: Secondary | ICD-10-CM | POA: Diagnosis not present

## 2021-08-28 LAB — RAD ONC ARIA SESSION SUMMARY
Course Elapsed Days: 38
Plan Fractions Treated to Date: 3
Plan Prescribed Dose Per Fraction: 2 Gy
Plan Total Fractions Prescribed: 15
Plan Total Prescribed Dose: 30 Gy
Reference Point Dosage Given to Date: 51 Gy
Reference Point Session Dosage Given: 2 Gy
Session Number: 28

## 2021-08-29 ENCOUNTER — Other Ambulatory Visit: Payer: Self-pay

## 2021-08-29 ENCOUNTER — Ambulatory Visit
Admission: RE | Admit: 2021-08-29 | Discharge: 2021-08-29 | Disposition: A | Discharge status: Home / Self Care | Payer: Medicare Other | Source: Ambulatory Visit | Attending: Radiation Oncology | Admitting: Radiation Oncology

## 2021-08-29 ENCOUNTER — Inpatient Hospital Stay: Payer: Medicare Other

## 2021-08-29 DIAGNOSIS — C61 Malignant neoplasm of prostate: Secondary | ICD-10-CM | POA: Diagnosis not present

## 2021-08-29 LAB — RAD ONC ARIA SESSION SUMMARY
Course Elapsed Days: 39
Plan Fractions Treated to Date: 4
Plan Prescribed Dose Per Fraction: 2 Gy
Plan Total Fractions Prescribed: 15
Plan Total Prescribed Dose: 30 Gy
Reference Point Dosage Given to Date: 53 Gy
Reference Point Session Dosage Given: 2 Gy
Session Number: 29

## 2021-09-01 ENCOUNTER — Inpatient Hospital Stay: Payer: Medicare Other

## 2021-09-01 ENCOUNTER — Other Ambulatory Visit: Payer: Self-pay

## 2021-09-01 ENCOUNTER — Ambulatory Visit
Admission: RE | Admit: 2021-09-01 | Discharge: 2021-09-01 | Disposition: A | Discharge status: Home / Self Care | Payer: Medicare Other | Source: Ambulatory Visit | Attending: Radiation Oncology | Admitting: Radiation Oncology

## 2021-09-01 DIAGNOSIS — C61 Malignant neoplasm of prostate: Secondary | ICD-10-CM | POA: Diagnosis not present

## 2021-09-01 LAB — RAD ONC ARIA SESSION SUMMARY
Course Elapsed Days: 42
Plan Fractions Treated to Date: 5
Plan Prescribed Dose Per Fraction: 2 Gy
Plan Total Fractions Prescribed: 15
Plan Total Prescribed Dose: 30 Gy
Reference Point Dosage Given to Date: 55 Gy
Reference Point Session Dosage Given: 2 Gy
Session Number: 30

## 2021-09-02 ENCOUNTER — Ambulatory Visit
Admission: RE | Admit: 2021-09-02 | Discharge: 2021-09-02 | Disposition: A | Discharge status: Home / Self Care | Payer: Medicare Other | Source: Ambulatory Visit | Attending: Radiation Oncology | Admitting: Radiation Oncology

## 2021-09-02 ENCOUNTER — Other Ambulatory Visit: Payer: Self-pay

## 2021-09-02 ENCOUNTER — Inpatient Hospital Stay: Payer: Medicare Other

## 2021-09-02 DIAGNOSIS — C61 Malignant neoplasm of prostate: Secondary | ICD-10-CM | POA: Diagnosis not present

## 2021-09-02 LAB — RAD ONC ARIA SESSION SUMMARY
Course Elapsed Days: 43
Plan Fractions Treated to Date: 6
Plan Prescribed Dose Per Fraction: 2 Gy
Plan Total Fractions Prescribed: 15
Plan Total Prescribed Dose: 30 Gy
Reference Point Dosage Given to Date: 57 Gy
Reference Point Session Dosage Given: 2 Gy
Session Number: 31

## 2021-09-03 ENCOUNTER — Ambulatory Visit
Admission: RE | Admit: 2021-09-03 | Discharge: 2021-09-03 | Disposition: A | Discharge status: Home / Self Care | Payer: Medicare Other | Source: Ambulatory Visit | Attending: Radiation Oncology | Admitting: Radiation Oncology

## 2021-09-03 ENCOUNTER — Other Ambulatory Visit: Payer: Self-pay

## 2021-09-03 ENCOUNTER — Inpatient Hospital Stay: Payer: Medicare Other

## 2021-09-03 DIAGNOSIS — C61 Malignant neoplasm of prostate: Secondary | ICD-10-CM | POA: Diagnosis not present

## 2021-09-03 LAB — RAD ONC ARIA SESSION SUMMARY
Course Elapsed Days: 44
Plan Fractions Treated to Date: 7
Plan Prescribed Dose Per Fraction: 2 Gy
Plan Total Fractions Prescribed: 15
Plan Total Prescribed Dose: 30 Gy
Reference Point Dosage Given to Date: 59 Gy
Reference Point Session Dosage Given: 2 Gy
Session Number: 32

## 2021-09-04 ENCOUNTER — Ambulatory Visit
Admission: RE | Admit: 2021-09-04 | Discharge: 2021-09-04 | Disposition: A | Discharge status: Home / Self Care | Payer: Medicare Other | Source: Ambulatory Visit | Attending: Radiation Oncology | Admitting: Radiation Oncology

## 2021-09-04 ENCOUNTER — Inpatient Hospital Stay: Payer: Medicare Other

## 2021-09-04 ENCOUNTER — Other Ambulatory Visit: Payer: Self-pay

## 2021-09-04 DIAGNOSIS — C61 Malignant neoplasm of prostate: Secondary | ICD-10-CM | POA: Diagnosis not present

## 2021-09-04 LAB — RAD ONC ARIA SESSION SUMMARY
Course Elapsed Days: 45
Plan Fractions Treated to Date: 8
Plan Prescribed Dose Per Fraction: 2 Gy
Plan Total Fractions Prescribed: 15
Plan Total Prescribed Dose: 30 Gy
Reference Point Dosage Given to Date: 61 Gy
Reference Point Session Dosage Given: 2 Gy
Session Number: 33

## 2021-09-05 ENCOUNTER — Other Ambulatory Visit: Payer: Self-pay

## 2021-09-05 ENCOUNTER — Ambulatory Visit
Admission: RE | Admit: 2021-09-05 | Discharge: 2021-09-05 | Disposition: A | Discharge status: Home / Self Care | Payer: Medicare Other | Source: Ambulatory Visit | Attending: Radiation Oncology | Admitting: Radiation Oncology

## 2021-09-05 ENCOUNTER — Inpatient Hospital Stay: Payer: Medicare Other

## 2021-09-05 DIAGNOSIS — C61 Malignant neoplasm of prostate: Secondary | ICD-10-CM | POA: Diagnosis not present

## 2021-09-05 LAB — RAD ONC ARIA SESSION SUMMARY
Course Elapsed Days: 46
Plan Fractions Treated to Date: 9
Plan Prescribed Dose Per Fraction: 2 Gy
Plan Total Fractions Prescribed: 15
Plan Total Prescribed Dose: 30 Gy
Reference Point Dosage Given to Date: 63 Gy
Reference Point Session Dosage Given: 2 Gy
Session Number: 34

## 2021-09-08 ENCOUNTER — Ambulatory Visit: Payer: Medicare Other

## 2021-09-08 ENCOUNTER — Inpatient Hospital Stay: Payer: Medicare Other

## 2021-09-08 ENCOUNTER — Telehealth: Payer: Self-pay

## 2021-09-08 NOTE — Telephone Encounter (Signed)
RN called patient verified identity and spoke about symptoms having that prevented him from coming in for radiation treatment this morning.  Patient and wife both had a stomach bug over the weekend that caused some nausea, vomiting, and stomach cramping.  They both have been feeling bad and Jacob [name] also complains about buttocks irritated.  Reports using Aquaphor cream for buttocks and will get some wet wipes and A & D ointment too.  They both have been drinking bone broth and rehydrating with water and will get some Gatorade since he can't tolerate milk products (Ensure, Boost).  Jacob [name] plans to come in tomorrow 09/09/2021 for treatment at scheduled time his wife will be calling the transportation people to inform them.  Dr. Kathrynn Running and radiation team made aware.

## 2021-09-09 ENCOUNTER — Other Ambulatory Visit: Payer: Self-pay

## 2021-09-09 ENCOUNTER — Inpatient Hospital Stay: Payer: Medicare Other

## 2021-09-09 ENCOUNTER — Ambulatory Visit: Payer: Medicare Other

## 2021-09-09 ENCOUNTER — Ambulatory Visit
Admission: RE | Admit: 2021-09-09 | Discharge: 2021-09-09 | Disposition: A | Discharge status: Home / Self Care | Payer: Medicare Other | Source: Ambulatory Visit | Attending: Radiation Oncology | Admitting: Radiation Oncology

## 2021-09-09 DIAGNOSIS — C61 Malignant neoplasm of prostate: Secondary | ICD-10-CM | POA: Diagnosis not present

## 2021-09-09 LAB — RAD ONC ARIA SESSION SUMMARY
Course Elapsed Days: 50
Plan Fractions Treated to Date: 10
Plan Prescribed Dose Per Fraction: 2 Gy
Plan Total Fractions Prescribed: 15
Plan Total Prescribed Dose: 30 Gy
Reference Point Dosage Given to Date: 65 Gy
Reference Point Session Dosage Given: 2 Gy
Session Number: 35

## 2021-09-10 ENCOUNTER — Other Ambulatory Visit: Payer: Self-pay

## 2021-09-10 ENCOUNTER — Inpatient Hospital Stay: Payer: Medicare Other

## 2021-09-10 ENCOUNTER — Ambulatory Visit
Admission: RE | Admit: 2021-09-10 | Discharge: 2021-09-10 | Disposition: A | Discharge status: Home / Self Care | Payer: Medicare Other | Source: Ambulatory Visit | Attending: Radiation Oncology | Admitting: Radiation Oncology

## 2021-09-10 DIAGNOSIS — C61 Malignant neoplasm of prostate: Secondary | ICD-10-CM | POA: Diagnosis not present

## 2021-09-10 LAB — RAD ONC ARIA SESSION SUMMARY
Course Elapsed Days: 51
Plan Fractions Treated to Date: 11
Plan Prescribed Dose Per Fraction: 2 Gy
Plan Total Fractions Prescribed: 15
Plan Total Prescribed Dose: 30 Gy
Reference Point Dosage Given to Date: 67 Gy
Reference Point Session Dosage Given: 2 Gy
Session Number: 36

## 2021-09-11 ENCOUNTER — Inpatient Hospital Stay: Payer: Medicare Other

## 2021-09-11 ENCOUNTER — Ambulatory Visit
Admission: RE | Admit: 2021-09-11 | Discharge: 2021-09-11 | Disposition: A | Discharge status: Home / Self Care | Payer: Medicare Other | Source: Ambulatory Visit | Attending: Radiation Oncology | Admitting: Radiation Oncology

## 2021-09-11 ENCOUNTER — Other Ambulatory Visit: Payer: Self-pay

## 2021-09-11 DIAGNOSIS — C61 Malignant neoplasm of prostate: Secondary | ICD-10-CM | POA: Diagnosis not present

## 2021-09-11 LAB — RAD ONC ARIA SESSION SUMMARY
Course Elapsed Days: 52
Plan Fractions Treated to Date: 12
Plan Prescribed Dose Per Fraction: 2 Gy
Plan Total Fractions Prescribed: 15
Plan Total Prescribed Dose: 30 Gy
Reference Point Dosage Given to Date: 69 Gy
Reference Point Session Dosage Given: 2 Gy
Session Number: 37

## 2021-09-12 ENCOUNTER — Other Ambulatory Visit: Payer: Self-pay

## 2021-09-12 ENCOUNTER — Ambulatory Visit
Admission: RE | Admit: 2021-09-12 | Discharge: 2021-09-12 | Disposition: A | Discharge status: Home / Self Care | Payer: Medicare Other | Source: Ambulatory Visit | Attending: Radiation Oncology | Admitting: Radiation Oncology

## 2021-09-12 ENCOUNTER — Inpatient Hospital Stay: Payer: Medicare Other

## 2021-09-12 DIAGNOSIS — C61 Malignant neoplasm of prostate: Secondary | ICD-10-CM | POA: Diagnosis not present

## 2021-09-12 LAB — RAD ONC ARIA SESSION SUMMARY
Course Elapsed Days: 53
Plan Fractions Treated to Date: 13
Plan Prescribed Dose Per Fraction: 2 Gy
Plan Total Fractions Prescribed: 15
Plan Total Prescribed Dose: 30 Gy
Reference Point Dosage Given to Date: 71 Gy
Reference Point Session Dosage Given: 2 Gy
Session Number: 38

## 2021-09-15 ENCOUNTER — Inpatient Hospital Stay: Payer: Medicare Other | Attending: Radiation Oncology

## 2021-09-15 ENCOUNTER — Other Ambulatory Visit: Payer: Self-pay

## 2021-09-15 ENCOUNTER — Ambulatory Visit
Admission: RE | Admit: 2021-09-15 | Discharge: 2021-09-15 | Disposition: A | Discharge status: Home / Self Care | Payer: Medicare Other | Source: Ambulatory Visit | Attending: Radiation Oncology | Admitting: Radiation Oncology

## 2021-09-15 ENCOUNTER — Ambulatory Visit: Payer: Medicare Other

## 2021-09-15 DIAGNOSIS — C61 Malignant neoplasm of prostate: Secondary | ICD-10-CM | POA: Insufficient documentation

## 2021-09-15 LAB — RAD ONC ARIA SESSION SUMMARY
Course Elapsed Days: 56
Plan Fractions Treated to Date: 14
Plan Prescribed Dose Per Fraction: 2 Gy
Plan Total Fractions Prescribed: 15
Plan Total Prescribed Dose: 30 Gy
Reference Point Dosage Given to Date: 73 Gy
Reference Point Session Dosage Given: 2 Gy
Session Number: 39

## 2021-09-16 ENCOUNTER — Ambulatory Visit: Payer: Medicare Other

## 2021-09-17 ENCOUNTER — Ambulatory Visit: Payer: Medicare Other

## 2021-09-17 ENCOUNTER — Other Ambulatory Visit: Payer: Self-pay

## 2021-09-17 ENCOUNTER — Encounter: Payer: Self-pay | Admitting: Radiation Oncology

## 2021-09-17 ENCOUNTER — Inpatient Hospital Stay: Payer: Medicare Other

## 2021-09-17 DIAGNOSIS — C61 Malignant neoplasm of prostate: Secondary | ICD-10-CM | POA: Diagnosis not present

## 2021-09-17 LAB — RAD ONC ARIA SESSION SUMMARY
Course Elapsed Days: 58
Plan Fractions Treated to Date: 15
Plan Prescribed Dose Per Fraction: 2 Gy
Plan Total Fractions Prescribed: 15
Plan Total Prescribed Dose: 30 Gy
Reference Point Dosage Given to Date: 75 Gy
Reference Point Session Dosage Given: 2 Gy
Session Number: 40

## 2021-09-18 ENCOUNTER — Ambulatory Visit: Payer: Medicare Other

## 2021-09-19 ENCOUNTER — Ambulatory Visit: Payer: Medicare Other

## 2021-09-23 ENCOUNTER — Ambulatory Visit (INDEPENDENT_AMBULATORY_CARE_PROVIDER_SITE_OTHER): Payer: Medicare Other | Admitting: Internal Medicine

## 2021-09-23 ENCOUNTER — Encounter: Payer: Self-pay | Admitting: Internal Medicine

## 2021-09-23 VITALS — BP 104/66 | HR 70 | Ht 66.5 in | Wt 229.6 lb

## 2021-09-23 DIAGNOSIS — I480 Paroxysmal atrial fibrillation: Secondary | ICD-10-CM | POA: Diagnosis not present

## 2021-09-23 DIAGNOSIS — Z950 Presence of cardiac pacemaker: Secondary | ICD-10-CM | POA: Diagnosis not present

## 2021-09-23 DIAGNOSIS — I442 Atrioventricular block, complete: Secondary | ICD-10-CM

## 2021-09-23 LAB — CUP PACEART INCLINIC DEVICE CHECK
Battery Remaining Longevity: 10 mo
Battery Voltage: 2.88 V
Brady Statistic RA Percent Paced: 0.09 %
Brady Statistic RV Percent Paced: 97.36 %
Date Time Interrogation Session: 20230711165313
Implantable Lead Implant Date: 20160622
Implantable Lead Implant Date: 20160622
Implantable Lead Location: 753859
Implantable Lead Location: 753860
Implantable Lead Model: 5076
Implantable Lead Model: 5076
Implantable Pulse Generator Implant Date: 20160622
Lead Channel Impedance Value: 304 Ohm
Lead Channel Impedance Value: 304 Ohm
Lead Channel Impedance Value: 399 Ohm
Lead Channel Impedance Value: 418 Ohm
Lead Channel Pacing Threshold Amplitude: 1 V
Lead Channel Pacing Threshold Pulse Width: 0.4 ms
Lead Channel Sensing Intrinsic Amplitude: 1.25 mV
Lead Channel Sensing Intrinsic Amplitude: 3.125 mV
Lead Channel Sensing Intrinsic Amplitude: 8 mV
Lead Channel Sensing Intrinsic Amplitude: 8.875 mV
Lead Channel Setting Pacing Amplitude: 2 V
Lead Channel Setting Pacing Amplitude: 2.5 V
Lead Channel Setting Pacing Pulse Width: 0.4 ms
Lead Channel Setting Sensing Sensitivity: 4 mV

## 2021-09-23 NOTE — Progress Notes (Signed)
HPI Mr. Vincente Liberty returns for ongoing evaluation of permanent atrial fib, CHB, s/p PPM insertion, sleep apnea and prostate CA. He has just finished receiving 40 XRT treatments. He is stable. He has been doing fasting and lost weight. He feels better. He denies chest pain or sob. Mild leg swelling.  Allergies  Allergen Reactions   Aspirin     Cannot take due to gi bleed age 88   Nsaids     Cannot take due to gi bleed age 3   Prednisone     REACTION: rash   Sulfa Antibiotics Rash and Other (See Comments)    "GI impact"     Current Outpatient Medications  Medication Sig Dispense Refill   allopurinol (ZYLOPRIM) 300 MG tablet Take 1 tablet (300 mg total) by mouth daily. 90 tablet 3   B Complex-C (SUPER B COMPLEX PO) Take 1 mg by mouth once.     Cholecalciferol (VITAMIN D3) 2000 UNITS capsule Take 1 capsule (2,000 Units total) by mouth daily.     Cyanocobalamin (VITAMIN B 12) 100 MCG LOZG Take 1,000 mg by mouth daily.     FeFum-FePoly-FA-B Cmp-C-Biot (INTEGRA PLUS) CAPS TAKE 1 CAPSULE BY MOUTH EVERY MORNING 90 capsule 0   furosemide (LASIX) 40 MG tablet Take 40 mg by mouth.     loratadine (CLARITIN) 10 MG tablet Take 10 mg by mouth as needed.     Magnesium 200 MG TABS Take 1 mg by mouth daily.     Melatonin 10 MG CAPS Take 10 mg by mouth at bedtime.     Omega-3 Fatty Acids (FISH OIL) 600 MG CAPS Take 600 mg by mouth daily.     pantoprazole (PROTONIX) 40 MG tablet Take 1 tablet (40 mg total) by mouth daily. 90 tablet 3   Polyethylene Glycol 3350 (MIRALAX PO) Take 1 mg by mouth in the morning, at noon, and at bedtime.     Potassium 95 MG TABS Take 1 mg by mouth daily.     tamsulosin (FLOMAX) 0.4 MG CAPS capsule Take 0.4 mg by mouth daily.   11   traMADol (ULTRAM) 50 MG tablet Take 1-2 tablets (50-100 mg total) by mouth every 6 (six) hours as needed for moderate pain. 5 tablet 0   XARELTO 20 MG TABS tablet TAKE 1 TABLET(20 MG) BY MOUTH DAILY WITH SUPPER (Patient taking differently:  Pt stopping for  surgery  last dose to be 06-29-2021) 90 tablet 1   No current facility-administered medications for this visit.     Past Medical History:  Diagnosis Date   Anemia    not current issue per pt   Arthritis    "middle finger both hands" (09/05/2014)   Basal cell carcinoma    Benign prostatic hypertrophy    Bilateral lower extremity edema    occ swelling ankles goes down when props legs up   Bruises easily    Chronic constipation    Colon polyp 2006, 2010   TUBULAR ADENOMA   Diverticulosis of colon (without mention of hemorrhage)    First degree atrioventricular block    GERD (gastroesophageal reflux disease)    Gout    no recent flares   History of blood transfusion 12/1953; 03/1954   Transfused 7 pints blood '55, 4 pints '56   History of blood transfusion    3-5 years ago per pt on 06-27-2021   History of duodenal ulcer 1955   w/hemorrhage   Kidney stones    "passed them"  MVP (mitral valve prolapse)    Osteoarthritis    Pneumonia    "a number of times"   Presence of permanent cardiac pacemaker    device implanted 09-05-2014   Prostate cancer ([place])    Seasonal allergies    Sleep apnea    uses bipap set on 23-19 per 08-07-2020 sleep study epic   Thalassemia trait    lov dr [name]   Urinary frequency    Wears glasses for reading     ROS:   All systems reviewed and negative except as noted in the HPI.   Past Surgical History:  Procedure Laterality Date   BIOPSY  10/19/2019   Procedure: BIOPSY;  Surgeon: Milus Banister, MD;  Location: [facility];  Service: Endoscopy;;   CATARACT EXTRACTION W/ INTRAOCULAR LENS  IMPLANT, BILATERAL  2002-2004   CLOSED REDUCTION ANKLE DISLOCATION Right    "toe my achilles" 1980's   COLONOSCOPY  2015   multiple    EP IMPLANTABLE DEVICE N/A 09/05/2014   Procedure: Pacemaker Implant;  Surgeon: [name], MD;  Location: [facility];  Service: Cardiovascular;  Laterality: N/A;   ESOPHAGOGASTRODUODENOSCOPY   2011   multiple   ESOPHAGOGASTRODUODENOSCOPY (EGD) WITH PROPOFOL N/A 10/19/2019   Procedure: ESOPHAGOGASTRODUODENOSCOPY (EGD) WITH PROPOFOL;  Surgeon: Milus Banister, MD;  Location: [facility];  Service: Endoscopy;  Laterality: N/A;   GASTRECTOMY  03/1954   Transfused 7 pints blood '55, 4 pints '56   GASTRECTOMY  1956   partial   GOLD SEED IMPLANT N/A 07/03/2021   Procedure: GOLD SEED IMPLANT;  Surgeon: Ardis Hughs, MD;  Location: [facility];  Service: Urology;  Laterality: N/A;   HEMOSTASIS CONTROL  10/19/2019   Procedure: HEMOSTASIS CONTROL;  Surgeon: Milus Banister, MD;  Location: [facility];  Service: Endoscopy;;  hemaspray   HOT HEMOSTASIS N/A 10/19/2019   Procedure: HOT HEMOSTASIS (ARGON PLASMA COAGULATION/BICAP);  Surgeon: Milus Banister, MD;  Location: [facility];  Service: Endoscopy;  Laterality: N/A;   INSERT / REPLACE / REMOVE PACEMAKER  09/05/2014   MOLE REMOVAL Left 2008   "forearm"   SPACE OAR INSTILLATION N/A 07/03/2021   Procedure: SPACE OAR INSTILLATION;  Surgeon: Ardis Hughs, MD;  Location: [facility];  Service: Urology;  Laterality: N/A;   TUMOR REMOVAL Left 2002   from finger- left index     Family History  Problem Relation Age of Onset   Coronary artery disease Father    Heart attack Father    Gout Father    Stomach cancer Paternal Grandmother    Cancer Daughter        skin   Stomach cancer Paternal Aunt    Colon cancer Neg Hx    Esophageal cancer Neg Hx    Pancreatic cancer Neg Hx      Social History   Socioeconomic History   Marital status: Married    Spouse name: ANn   Number of children: 8   Years of education: 18+   Highest education level: Not on file  Occupational History   Occupation: executive    Comment: retired  Tobacco Use   Smoking status: Former    Packs/day: 3.00    Years: 20.00    Total pack years: 60.00    Types: Cigarettes    Start date: 01/21/1970    Quit date:  02/1970    Years since quitting: 51.6   Smokeless tobacco: Never   Tobacco comments:    quite  smoking cigarettes in 1971  Vaping Use   Vaping Use: Never used  Substance and Sexual Activity   Alcohol use: No    Comment: "no alcohol since 1987"   Drug use: No   Sexual activity: Yes    Partners: Female  Other Topics Concern   Not on file  Social History Narrative   [employer] [place]; [place]. Married '59. Eight Daughters '60, '62, '66, '70, '73, '74, '78, '83; 89 grandchildren 1great grandchild. Lives independently with wife. ACP- asked pt to think about these issues (DEc '12)      Social Determinants of Health   Financial Resource Strain: Not on file  Food Insecurity: Not on file  Transportation Needs: Unmet Transportation Needs (09/17/2021)   PRAPARE - Hydrologist (Medical): Not on file    Lack of Transportation (Non-Medical): Yes  Physical Activity: Not on file  Stress: Not on file  Social Connections: Not on file  Intimate Partner Violence: Not on file     BP 104/66   Pulse 70   Ht 5' 6.5" (1.689 m)   Wt 229 lb 9.6 oz (104.1 kg)   SpO2 95%   BMI 36.50 kg/m   Physical Exam:  Well appearing NAD HEENT: Unremarkable Neck:  No JVD, no thyromegally Lymphatics:  No adenopathy Back:  No CVA tenderness Lungs:  Clear with no wheezes HEART:  Regular rate rhythm, no murmurs, no rubs, no clicks Abd:  soft, positive bowel sounds, no organomegally, no rebound, no guarding Ext:  2 plus pulses, 1+ edema, no cyanosis, no clubbing Skin:  No rashes no nodules Neuro:  CN II through XII intact, motor grossly intact  DEVICE  Normal device function.  See PaceArt for details.   Assess/Plan:  Perm atrial fib - his VR is well controlled. No change in his meds. CHB - he is asymptomatic s/p PPM insertion.  PPM -his Medtronic DDD PM is working normally and programmed VVIR. Sleep apnea - he has been switched to bipap but his wife notes that  he is still stopping breathing at night. I asked him to reach out to Dr. Juanetta Gosling office.  Carleene Overlie Jakeline Dave,MD

## 2021-09-23 NOTE — Patient Instructions (Signed)
Medication Instructions:  Your physician recommends that you continue on your current medications as directed. Please refer to the Current Medication list given to you today.  Labwork: None ordered.  Testing/Procedures: None ordered.  Follow-Up: Your physician wants you to follow-up in: one year with Cristopher Peru, MD or one of the following Advanced Practice Providers on your designated Care Team:   Tommye Standard, [place] Legrand Como "Jonni Sanger" Chalmers Cater, [place]  Remote monitoring is used to monitor your Pacemaker from home. This monitoring reduces the number of office visits required to check your device to one time per year. It allows [place] to keep an eye on the functioning of your device to ensure it is working properly. You are scheduled for a device check from home on 10/30/2021. You may send your transmission at any time that day. If you have a wireless device, the transmission will be sent automatically. After your physician reviews your transmission, you will receive a postcard with your next transmission date.  Any Other Special Instructions Will Be Listed Below (If Applicable).  If you need a refill on your cardiac medications before your next appointment, please call your pharmacy.   Important Information About Sugar

## 2021-09-29 ENCOUNTER — Other Ambulatory Visit: Payer: Self-pay | Admitting: Physician Assistant

## 2021-09-29 DIAGNOSIS — D568 Other thalassemias: Secondary | ICD-10-CM

## 2021-10-02 ENCOUNTER — Other Ambulatory Visit: Payer: Self-pay | Admitting: Cardiology

## 2021-10-21 ENCOUNTER — Encounter: Payer: Medicare Other | Admitting: Internal Medicine

## 2021-10-28 ENCOUNTER — Ambulatory Visit: Payer: Medicare Other

## 2021-10-28 NOTE — Progress Notes (Signed)
RN reached out to patient to offer 1 month follow up appointment post radiation treatment.   Pt declined follow up at this time.  RN encouraged patient to call back if he wishes to pursue appointment.   Pt does have continued follow up's @ [facility] for continuation of hormonal therapy and PSA monitoring.

## 2021-10-30 ENCOUNTER — Ambulatory Visit (INDEPENDENT_AMBULATORY_CARE_PROVIDER_SITE_OTHER): Payer: Medicare Other

## 2021-10-30 DIAGNOSIS — I442 Atrioventricular block, complete: Secondary | ICD-10-CM | POA: Diagnosis not present

## 2021-10-31 LAB — CUP PACEART REMOTE DEVICE CHECK
Battery Remaining Longevity: 9 mo
Battery Voltage: 2.87 V
Brady Statistic RA Percent Paced: 0.12 %
Brady Statistic RV Percent Paced: 95.45 %
Date Time Interrogation Session: 20230818112828
Implantable Lead Implant Date: 20160622
Implantable Lead Implant Date: 20160622
Implantable Lead Location: 753859
Implantable Lead Location: 753860
Implantable Lead Model: 5076
Implantable Lead Model: 5076
Implantable Pulse Generator Implant Date: 20160622
Lead Channel Impedance Value: 323 Ohm
Lead Channel Impedance Value: 323 Ohm
Lead Channel Impedance Value: 418 Ohm
Lead Channel Impedance Value: 437 Ohm
Lead Channel Pacing Threshold Amplitude: 0.625 V
Lead Channel Pacing Threshold Amplitude: 1 V
Lead Channel Pacing Threshold Pulse Width: 0.4 ms
Lead Channel Pacing Threshold Pulse Width: 0.4 ms
Lead Channel Sensing Intrinsic Amplitude: 1.5 mV
Lead Channel Sensing Intrinsic Amplitude: 1.5 mV
Lead Channel Sensing Intrinsic Amplitude: 5.125 mV
Lead Channel Sensing Intrinsic Amplitude: 5.125 mV
Lead Channel Setting Pacing Amplitude: 2 V
Lead Channel Setting Pacing Amplitude: 2.5 V
Lead Channel Setting Pacing Pulse Width: 0.4 ms
Lead Channel Setting Sensing Sensitivity: 4 mV

## 2021-11-04 ENCOUNTER — Encounter: Payer: Self-pay | Admitting: Internal Medicine

## 2021-11-04 NOTE — Progress Notes (Signed)
  Radiation Oncology         [phone] ________________________________  Name: Jacob [name] MRN: [id number]  Date: 09/17/2021  DOB: 1933-05-08  End of Treatment Note  Diagnosis:   88 y.o. gentleman with Stage T1c adenocarcinoma of the prostate with Gleason score of 4+4, and PSA of 24.     Indication for treatment:  Curative, Definitive Radiotherapy       Radiation treatment dates:   5/8-09/17/21  Site/dose:  1. The prostate, seminal vesicles, and pelvic lymph nodes were initially treated to 45 Gy in 25 fractions of 1.8 Gy  2. The prostate only was boosted to 75 Gy with 15 additional fractions of 2.0 Gy   Beams/energy:  1. The prostate, seminal vesicles, and pelvic lymph nodes were initially treated using VMAT intensity modulated radiotherapy delivering 6 megavolt photons. Image guidance was performed with CB-CT studies prior to each fraction. He was immobilized with a body fix lower extremity mold.  2. the prostate only was boosted using VMAT intensity modulated radiotherapy delivering 6 megavolt photons. Image guidance was performed with CB-CT studies prior to each fraction. He was immobilized with a body fix lower extremity mold.  Narrative: The patient tolerated radiation treatment relatively well.   The patient experienced some minor urinary irritation and modest fatigue.    Plan: The patient has completed radiation treatment. He will return to radiation oncology clinic for routine followup in one month. I advised him to call or return sooner if he has any questions or concerns related to his recovery or treatment. ________________________________  Sheral Apley. Tammi Klippel, M.D.

## 2021-11-18 NOTE — Progress Notes (Signed)
Remote pacemaker transmission.   

## 2021-12-03 ENCOUNTER — Other Ambulatory Visit: Payer: Self-pay | Admitting: Internal Medicine

## 2022-01-03 ENCOUNTER — Other Ambulatory Visit: Payer: Self-pay | Admitting: Cardiology

## 2022-01-03 DIAGNOSIS — I480 Paroxysmal atrial fibrillation: Secondary | ICD-10-CM

## 2022-01-05 NOTE — Telephone Encounter (Signed)
Prescription refill request for Xarelto received.  Indication: Afib  Last office visit: 09/23/21 Jacob [name])  Weight: 104.4kg Age: 88 Scr: 1.08 (06/23/21)  CrCl: 72.49m/min  Appropriate dose and refill sent to requested pharmacy.

## 2022-01-12 ENCOUNTER — Other Ambulatory Visit: Payer: Self-pay | Admitting: Physician Assistant

## 2022-01-12 DIAGNOSIS — D568 Other thalassemias: Secondary | ICD-10-CM

## 2022-01-16 ENCOUNTER — Other Ambulatory Visit: Payer: Self-pay | Admitting: Physician Assistant

## 2022-01-16 DIAGNOSIS — D568 Other thalassemias: Secondary | ICD-10-CM

## 2022-01-17 ENCOUNTER — Encounter: Payer: Self-pay | Admitting: Internal Medicine

## 2022-01-21 ENCOUNTER — Other Ambulatory Visit: Payer: Self-pay | Admitting: Internal Medicine

## 2022-01-21 ENCOUNTER — Telehealth: Payer: Self-pay

## 2022-01-21 ENCOUNTER — Telehealth: Payer: Self-pay | Admitting: Medical Oncology

## 2022-01-21 DIAGNOSIS — D568 Other thalassemias: Secondary | ICD-10-CM

## 2022-01-21 MED ORDER — INTEGRA PLUS PO CAPS: 1.0000 | ORAL_CAPSULE | Freq: Every morning | ORAL | 0 refills | Status: DC

## 2022-01-21 NOTE — Telephone Encounter (Signed)
This patient called in and stated that he attempted to refill his Integra Plus and was informed that the prescription has been cancelled.  Patient would like to know why it was cancelled.

## 2022-01-21 NOTE — Telephone Encounter (Signed)
Integra refill -problem LVM at pharmacy to return my call.

## 2022-01-27 ENCOUNTER — Encounter: Payer: Self-pay | Admitting: Internal Medicine

## 2022-02-10 ENCOUNTER — Ambulatory Visit (INDEPENDENT_AMBULATORY_CARE_PROVIDER_SITE_OTHER): Payer: Medicare Other

## 2022-02-10 DIAGNOSIS — I420 Dilated cardiomyopathy: Secondary | ICD-10-CM

## 2022-02-10 DIAGNOSIS — Z950 Presence of cardiac pacemaker: Secondary | ICD-10-CM

## 2022-02-10 LAB — CUP PACEART REMOTE DEVICE CHECK
Battery Remaining Longevity: 4 mo
Battery Voltage: 2.86 V
Brady Statistic RA Percent Paced: 0.11 %
Brady Statistic RV Percent Paced: 94.64 %
Date Time Interrogation Session: 20231128133835
Implantable Lead Connection Status: 753985
Implantable Lead Connection Status: 753985
Implantable Lead Implant Date: 20160622
Implantable Lead Implant Date: 20160622
Implantable Lead Location: 753859
Implantable Lead Location: 753860
Implantable Lead Model: 5076
Implantable Lead Model: 5076
Implantable Pulse Generator Implant Date: 20160622
Lead Channel Impedance Value: 304 Ohm
Lead Channel Impedance Value: 323 Ohm
Lead Channel Impedance Value: 399 Ohm
Lead Channel Impedance Value: 418 Ohm
Lead Channel Pacing Threshold Amplitude: 0.625 V
Lead Channel Pacing Threshold Amplitude: 1 V
Lead Channel Pacing Threshold Pulse Width: 0.4 ms
Lead Channel Pacing Threshold Pulse Width: 0.4 ms
Lead Channel Sensing Intrinsic Amplitude: 1.75 mV
Lead Channel Sensing Intrinsic Amplitude: 1.75 mV
Lead Channel Sensing Intrinsic Amplitude: 10.375 mV
Lead Channel Sensing Intrinsic Amplitude: 10.375 mV
Lead Channel Setting Pacing Amplitude: 2 V
Lead Channel Setting Pacing Amplitude: 2.5 V
Lead Channel Setting Pacing Pulse Width: 0.4 ms
Lead Channel Setting Sensing Sensitivity: 4 mV
Zone Setting Status: 755011
Zone Setting Status: 755011

## 2022-03-01 ENCOUNTER — Other Ambulatory Visit: Payer: Self-pay

## 2022-03-01 ENCOUNTER — Encounter (HOSPITAL_BASED_OUTPATIENT_CLINIC_OR_DEPARTMENT_OTHER): Payer: Self-pay | Admitting: Emergency Medicine

## 2022-03-01 ENCOUNTER — Emergency Department (HOSPITAL_BASED_OUTPATIENT_CLINIC_OR_DEPARTMENT_OTHER)
Admission: EM | Admit: 2022-03-01 | Discharge: 2022-03-01 | Disposition: A | Discharge status: Home / Self Care | Payer: Medicare Other | Attending: Emergency Medicine | Admitting: Emergency Medicine

## 2022-03-01 DIAGNOSIS — Z85828 Personal history of other malignant neoplasm of skin: Secondary | ICD-10-CM | POA: Diagnosis not present

## 2022-03-01 DIAGNOSIS — Z8546 Personal history of malignant neoplasm of prostate: Secondary | ICD-10-CM | POA: Diagnosis not present

## 2022-03-01 DIAGNOSIS — K432 Incisional hernia without obstruction or gangrene: Secondary | ICD-10-CM | POA: Diagnosis not present

## 2022-03-01 DIAGNOSIS — R109 Unspecified abdominal pain: Secondary | ICD-10-CM | POA: Diagnosis present

## 2022-03-01 NOTE — Discharge Instructions (Signed)
You were seen in the emergency room today with abdominal bulging which I suspect was an incisional hernia.  He did a good job reducing this on your own and are not currently having symptoms.  If you have return of this bulge or develop abdominal pain, vomiting, inability to pass gas or have a bowel movement you should be reevaluated in the emergency department immediately.  I have listed the name of the general surgeon on this form to call for a follow-up appointment.

## 2022-03-01 NOTE — ED Provider Notes (Signed)
Emergency Department Provider Note   I have reviewed the triage vital signs and the nursing notes.   HISTORY  Chief Complaint Hernia   HPI Jacob [name] is a 88 y.o. male presents to the ED with abdominal pain and focal swelling. He noticed the area this AM with swelling and discomfort. No vomiting. He continues to have BMs. No fever. He pushed on the area and it resolved but after speaking with family and his PCP, was referred to the ED. Currently, feeling well with no symptoms.    Past Medical History:  Diagnosis Date   Anemia    not current issue per pt   Arthritis    "middle finger both hands" (09/05/2014)   Basal cell carcinoma    Benign prostatic hypertrophy    Bilateral lower extremity edema    occ swelling ankles goes down when props legs up   Bruises easily    Chronic constipation    Colon polyp 2006, 2010   TUBULAR ADENOMA   Diverticulosis of colon (without mention of hemorrhage)    First degree atrioventricular block    GERD (gastroesophageal reflux disease)    Gout    no recent flares   History of blood transfusion 12/1953; 03/1954   Transfused 7 pints blood '55, 4 pints '56   History of blood transfusion    3-5 years ago per pt on 06-27-2021   History of duodenal ulcer 1955   w/hemorrhage   Kidney stones    "passed them"   MVP (mitral valve prolapse)    Osteoarthritis    Pneumonia    "a number of times"   Presence of permanent cardiac pacemaker    device implanted 09-05-2014   Prostate cancer ([place])    Seasonal allergies    Sleep apnea    uses bipap set on 23-19 per 08-07-2020 sleep study epic   Thalassemia trait    lov dr [name]   Urinary frequency    Wears glasses for reading     Review of Systems  Constitutional: No fever/chills Eyes: No visual changes. ENT: No sore throat. Cardiovascular: Denies chest pain. Respiratory: Denies shortness of breath. Gastrointestinal: Positive abdominal pain.  No nausea, no vomiting.  No diarrhea.   No constipation. Genitourinary: Negative for dysuria. Musculoskeletal: Negative for back pain. Skin: Negative for rash. Neurological: Negative for headaches, focal weakness or numbness.   ____________________________________________   PHYSICAL EXAM:  VITAL SIGNS: ED Triage Vitals  Enc Vitals Group     BP 03/01/22 1354 (!) 120/55     Pulse Rate 03/01/22 1354 63     Resp 03/01/22 1354 20     Temp 03/01/22 1354 97.8 F (36.6 C)     Temp Source 03/01/22 1354 Oral     SpO2 03/01/22 1354 100 %   Constitutional: Alert and oriented. Well appearing and in no acute distress. Eyes: Conjunctivae are normal.  Head: Atraumatic. Nose: No congestion/rhinnorhea. Mouth/Throat: Mucous membranes are moist.  Neck: No stridor.   Cardiovascular: Normal rate, regular rhythm. Good peripheral circulation. Grossly normal heart sounds.   Respiratory: Normal respiratory effort.  No retractions. Lungs CTAB. Gastrointestinal: Soft and nontender. No distention.  Musculoskeletal: No lower extremity tenderness nor edema. No gross deformities of extremities. Neurologic:  Normal speech and language. No gross focal neurologic deficits are appreciated.  Skin:  Skin is warm, dry and intact. No rash noted.  ____________________________________________   PROCEDURES  Procedure(s) performed:   Procedures  None ____________________________________________   INITIAL IMPRESSION / ASSESSMENT  AND PLAN / ED COURSE  Pertinent labs & imaging results that were available during my care of the patient were reviewed by me and considered in my medical decision making (see chart for details).   This patient is Presenting for Evaluation of abdominal pain, which does require a range of treatment options, and is a complaint that involves a high risk of morbidity and mortality.  The Differential Diagnoses  includes but is not exclusive to acute cholecystitis, intrathoracic causes for epigastric abdominal pain, gastritis,  duodenitis, pancreatitis, small bowel or large bowel obstruction, abdominal aortic aneurysm, hernia, gastritis, etc.    I did obtain Additional Historical Information from family at bedside.   Radiologic Tests: Considered CT abdomen/pelvis but symptoms resolved and no active pain or tenderness.     Medical Decision Making: Summary:  Patient clinically describing an incisional hernia that he was able to self-reduce at home. Currently patient is asymptomatic. Plan for no imaging here. Discussed strict ED return precautions but will refer to general surgery as an outpatient.   Patient's presentation is most consistent with acute illness / injury with system symptoms.   Disposition: discharge  ____________________________________________  FINAL CLINICAL IMPRESSION(S) / ED DIAGNOSES  Final diagnoses:  Incisional hernia, without obstruction or gangrene      Note:  This document was prepared using Dragon voice recognition software and may include unintentional dictation errors.  Nanda Quinton, MD, [facility]    Treyven Lafauci, [facility], MD 03/08/22 [id number]

## 2022-03-01 NOTE — ED Triage Notes (Signed)
Pt reports had a "ball"-sized lump that was in epigastric area; he pushed it back in w/o difficulty; they called PCP and they recommended he be checked out here; denies any sxs at this time

## 2022-03-03 NOTE — Progress Notes (Signed)
Cardiology Office Note   Date:  03/06/2022   ID:  TRASE BUNDA, DOB 04-Dec-1933, MRN [id number]  PCP:  [name], MD  Cardiologist:   [name], MD   Chief Complaint  Patient presents with   Atrial Fibrillation      History of Present Illness: Jacob [name] is a 88 y.o. male who presents  for followup of his slow heart rate, fib/flutter.  He is status post pacemaker placement.  Since I last saw him he has been diagnosed with prostate cancer.    From a cardiac standpoint he had no acute complaints.  He does not describe PND or orthopnea.  He does not have any palpitations, presyncope or syncope.  He has some chronic mild ankle swelling.  His wife says he stops breathing at night.  However, he had complained about this and the summer 2022 and was told to go back to see his pulmonologist to have further evaluation of his BiPAP and possibly a repeat study.  He never did this.  During the day he is not having any shortness of breath or evidence of heart failure.  He is not having any chest pressure, neck or arm discomfort.  He does have hernia at the top part of his abdominal incision and might need to have this fixed.  This just involved and they are supposed to see a surgeon but his wife canceled that appointment.   Past Medical History:  Diagnosis Date   Anemia    not current issue per pt   Arthritis    "middle finger both hands" (09/05/2014)   Basal cell carcinoma    Benign prostatic hypertrophy    Bilateral lower extremity edema    occ swelling ankles goes down when props legs up   Bruises easily    Chronic constipation    Colon polyp 2006, 2010   TUBULAR ADENOMA   Diverticulosis of colon (without mention of hemorrhage)    First degree atrioventricular block    GERD (gastroesophageal reflux disease)    Gout    no recent flares   History of blood transfusion 12/1953; 03/1954   Transfused 7 pints blood '55, 4 pints '56   History of blood  transfusion    3-5 years ago per pt on 06-27-2021   History of duodenal ulcer 1955   w/hemorrhage   Kidney stones    "passed them"   MVP (mitral valve prolapse)    Osteoarthritis    Pneumonia    "a number of times"   Presence of permanent cardiac pacemaker    device implanted 09-05-2014   Prostate cancer ([place])    Seasonal allergies    Sleep apnea    uses bipap set on 23-19 per 08-07-2020 sleep study epic   Thalassemia trait    lov dr [name]   Urinary frequency    Wears glasses for reading     Past Surgical History:  Procedure Laterality Date   BIOPSY  10/19/2019   Procedure: BIOPSY;  Surgeon: Milus Banister, MD;  Location: [facility];  Service: Endoscopy;;   CATARACT EXTRACTION W/ INTRAOCULAR LENS  IMPLANT, BILATERAL  2002-2004   CLOSED REDUCTION ANKLE DISLOCATION Right    "toe my achilles" 1980's   COLONOSCOPY  2015   multiple    EP IMPLANTABLE DEVICE N/A 09/05/2014   Procedure: Pacemaker Implant;  Surgeon: [name], MD;  Location: [facility];  Service: Cardiovascular;  Laterality: N/A;   ESOPHAGOGASTRODUODENOSCOPY  2011  multiple   ESOPHAGOGASTRODUODENOSCOPY (EGD) WITH PROPOFOL N/A 10/19/2019   Procedure: ESOPHAGOGASTRODUODENOSCOPY (EGD) WITH PROPOFOL;  Surgeon: Milus Banister, MD;  Location: [facility];  Service: Endoscopy;  Laterality: N/A;   GASTRECTOMY  03/1954   Transfused 7 pints blood '55, 4 pints '56   GASTRECTOMY  1956   partial   GOLD SEED IMPLANT N/A 07/03/2021   Procedure: GOLD SEED IMPLANT;  Surgeon: Ardis Hughs, MD;  Location: [facility];  Service: Urology;  Laterality: N/A;   HEMOSTASIS CONTROL  10/19/2019   Procedure: HEMOSTASIS CONTROL;  Surgeon: Milus Banister, MD;  Location: [facility];  Service: Endoscopy;;  hemaspray   HOT HEMOSTASIS N/A 10/19/2019   Procedure: HOT HEMOSTASIS (ARGON PLASMA COAGULATION/BICAP);  Surgeon: Milus Banister, MD;  Location: [facility];  Service: Endoscopy;  Laterality:  N/A;   INSERT / REPLACE / REMOVE PACEMAKER  09/05/2014   MOLE REMOVAL Left 2008   "forearm"   SPACE OAR INSTILLATION N/A 07/03/2021   Procedure: SPACE OAR INSTILLATION;  Surgeon: Ardis Hughs, MD;  Location: [facility];  Service: Urology;  Laterality: N/A;   TUMOR REMOVAL Left 2002   from finger- left index     Current Outpatient Medications  Medication Sig Dispense Refill   allopurinol (ZYLOPRIM) 300 MG tablet Take 1 tablet (300 mg total) by mouth daily. 90 tablet 3   B Complex-C (SUPER B COMPLEX PO) Take 1 mg by mouth once.     Cholecalciferol (VITAMIN D3) 2000 UNITS capsule Take 1 capsule (2,000 Units total) by mouth daily.     Cyanocobalamin (VITAMIN B 12) 100 MCG LOZG Take 1,000 mg by mouth daily.     FeFum-FePoly-FA-B Cmp-C-Biot (INTEGRA PLUS) CAPS Take 1 capsule by mouth every morning. 90 capsule 0   furosemide (LASIX) 40 MG tablet TAKE 1 TABLET(40 MG) BY MOUTH DAILY 90 tablet 2   loratadine (CLARITIN) 10 MG tablet Take 10 mg by mouth as needed.     Magnesium 200 MG TABS Take 1 mg by mouth daily.     Melatonin 10 MG CAPS Take 10 mg by mouth at bedtime.     Omega-3 Fatty Acids (FISH OIL) 600 MG CAPS Take 600 mg by mouth daily.     pantoprazole (PROTONIX) 40 MG tablet TAKE 1 TABLET(40 MG) BY MOUTH DAILY 90 tablet 3   Polyethylene Glycol 3350 (MIRALAX PO) Take 1 mg by mouth in the morning, at noon, and at bedtime.     Potassium 95 MG TABS Take 1 mg by mouth daily.     tamsulosin (FLOMAX) 0.4 MG CAPS capsule Take 0.4 mg by mouth daily.   11   traMADol (ULTRAM) 50 MG tablet Take 1-2 tablets (50-100 mg total) by mouth every 6 (six) hours as needed for moderate pain. 5 tablet 0   XARELTO 20 MG TABS tablet TAKE 1 TABLET(20 MG) BY MOUTH DAILY WITH SUPPER 90 tablet 1   No current facility-administered medications for this visit.    Allergies:   Aspirin, Nsaids, Prednisone, and Sulfa antibiotics   ROS:  Please see the history of present illness.   Otherwise,  review of systems are positive for none.   All other systems are reviewed and negative.    PHYSICAL EXAM: VS:  BP (!) 132/58   Pulse 71   Ht '5\' 6"'$  (1.676 m)   Wt 225 lb 12.8 oz (102.4 kg)   SpO2 96%   BMI 36.45 kg/m  , BMI Body mass index is 36.45  kg/m. GENERAL:  Well appearing NECK:  No jugular venous distention, waveform within normal limits, carotid upstroke brisk and symmetric, no bruits, no thyromegaly LUNGS:  Clear to auscultation bilaterally CHEST: Well-healed sternotomy scar HEART:  PMI not displaced or sustained,S1 and S2 within normal limits, no S3,  no clicks, no rubs, no murmurs ABD:  Flat, positive bowel sounds normal in frequency in pitch, no bruits, no rebound, no guarding, no midline pulsatile mass, no hepatomegaly, no splenomegaly EXT:  2 plus pulses throughout, mild bilateral ankle edema, no cyanosis no clubbing  EKG:  EKG is  ordered today. The ekg ordered today demonstrates underlying atrial fibrillation with ventricular pacing 100% capture   Recent Labs: 06/23/2021: ALT 23; Platelets 141 07/03/2021: BUN 33; Creatinine, Ser 0.90; Hemoglobin 12.6; Potassium 3.6; Sodium 142    Lipid Panel    Component Value Date/Time   CHOL 166 11/03/2012 0735   TRIG 45.0 11/03/2012 0735   HDL 49.90 11/03/2012 0735   CHOLHDL 3 11/03/2012 0735   VLDL 9.0 11/03/2012 0735   LDLCALC 107 (H) 11/03/2012 0735   LDLDIRECT 145.0 08/29/2007 1426      Wt Readings from Last 3 Encounters:  03/05/22 225 lb 12.8 oz (102.4 kg)  09/23/21 229 lb 9.6 oz (104.1 kg)  07/03/21 260 lb 1.6 oz (118 kg)      Other studies Reviewed: Additional studies/ records that were reviewed today include: Labs, pulmonary notes. Review of the above records demonstrates:  Please see elsewhere in the note.     ASSESSMENT AND PLAN:  ATRIAL FIB - Jacob [name] has a CHA2DS2 - VASc score of 2.     Tolerates anticoagulation.  No change in therapy.  PPM - I reviewed the device check from last  month.  Battery longevity is 4 months.  I sent a note to Dr. Lovena Le although I am sure there clinic will be arranging follow-up.   CARDIOMYOPATHY - EF was 65% in July.  I would have no reason to think that this is lower.  He is not having symptoms consistent with overt heart failure.  He has some mild lower extremity swelling which I would manage conservatively and I have suggested compression stockings.   SLEEP APNEA - Sounds like he might have some interrupted breathing still and I have asked him to call Dr. Halford Chessman to arrange follow-up as previously had been suggested.  He might need repeat study.   Current medicines are reviewed at length with the patient today.  The patient does not have concerns regarding medicines.  The following changes have been made:  None  Labs/ tests ordered today include: None  No orders of the defined types were placed in this encounter.    Disposition:   FU with me 12 months.    Signed, [name], MD  03/06/2022 9:12 AM    [place]

## 2022-03-05 ENCOUNTER — Encounter: Payer: Self-pay | Admitting: Cardiology

## 2022-03-05 ENCOUNTER — Ambulatory Visit: Payer: Medicare Other | Attending: Cardiology | Admitting: Cardiology

## 2022-03-05 VITALS — BP 132/58 | HR 71 | Ht 66.0 in | Wt 225.8 lb

## 2022-03-05 DIAGNOSIS — I4811 Longstanding persistent atrial fibrillation: Secondary | ICD-10-CM

## 2022-03-05 NOTE — Patient Instructions (Signed)
Medication Instructions:  Continue same medications *If you need a refill on your cardiac medications before your next appointment, please call your pharmacy*   Lab Work: None ordered   Testing/Procedures: None ordered   Follow-Up: At [facility], you and your health needs are our priority.  As part of our continuing mission to provide you with exceptional heart care, we have created designated Provider Care Teams.  These Care Teams include your primary Cardiologist (physician) and Advanced Practice Providers (APPs -  Physician Assistants and Nurse Practitioners) who all work together to provide you with the care you need, when you need it.  We recommend signing up for the patient portal called "MyChart".  Sign up information is provided on this After Visit Summary.  MyChart is used to connect with patients for Virtual Visits (Telemedicine).  Patients are able to view lab/test results, encounter notes, upcoming appointments, etc.  Non-urgent messages can be sent to your provider as well.   To learn more about what you can do with MyChart, go to [email or web address].    Your next appointment:  1 year  Call in Sept to schedule Dec appointment     The format for your next appointment: Office    Provider:  Dr.Hochrein   Important Information About Sugar

## 2022-03-06 ENCOUNTER — Encounter: Payer: Self-pay | Admitting: Cardiology

## 2022-03-06 ENCOUNTER — Telehealth: Payer: Self-pay

## 2022-03-06 NOTE — Telephone Encounter (Signed)
Patient called in wanting to know about his battery life since he never got a phone call about it. Patient wants to speak with a nurse

## 2022-03-06 NOTE — Telephone Encounter (Signed)
Returned call to Pt.  Advised Pt he has 4 months left on his battery.  Advised had scheduled him for monthly battery checks, but his device will also send an alert when it reaches ERI.  Scheduled follow up with Dr. Lovena Le in March-this will be close to ERI.  Pt states he is going to be changing phones.  Advised to let us know when he changes phones so we can ensure his MDT APP is working correctly.  All questions answered.

## 2022-03-11 NOTE — Progress Notes (Signed)
Remote pacemaker transmission.   

## 2022-03-12 ENCOUNTER — Ambulatory Visit: Payer: Medicare Other

## 2022-03-18 ENCOUNTER — Telehealth: Payer: Self-pay | Admitting: Internal Medicine

## 2022-03-18 NOTE — Telephone Encounter (Signed)
   Pt would like to know how often he needs to send his transmission to [place] to know if his PPM is in ERI. He said he removes the battery on his monitor every after he send his transmission to [place] because that was he was instructed to do.

## 2022-03-18 NOTE — Telephone Encounter (Signed)
Returned call to Pt.  Pt with continued concerns about nearing ERI.  He states his brother's pacemaker battery ran out and he did not feel well.  Again reassured Pt that we are following his device with monthly battery checks.  He currently has 3 months left on his battery.  He has an appointment beginning of March 2024 with Dr. Lovena Le.  He has been taking the batteries out of his handheld after every transmission.  Advised Pt he did not have to remove batteries, the device turns itself off.  He will read the manual and call back with any questions.

## 2022-03-23 ENCOUNTER — Other Ambulatory Visit: Payer: Self-pay | Admitting: General Surgery

## 2022-03-23 DIAGNOSIS — K432 Incisional hernia without obstruction or gangrene: Secondary | ICD-10-CM

## 2022-04-03 NOTE — Progress Notes (Unsigned)
04/09/2022 Jacob [name] [id number] 09/26/1933  Referring provider: [name], MD Primary GI doctor: Dr. [name]  ASSESSMENT AND PLAN:   Constipation, unspecified constipation type Longstanding history of constipation on MiraLAX, magnesium, Colace Question if bloating is potentially from IBS-C Will get KUB to evaluate, given Linzess 145 mcg samples with instructions if he wishes to try this  Abdominal bloating Has been better with dietary changes, given information about foods to avoid with bloating.  Given FODMAP diet.  If this does not help can consider SIBO testing. Will try to help constipation  History of peptic ulcer disease Status post Billroth II surgery EGD 2021 gastritis Continue pantoprazole 40 mg once daily  Incisional hernia, without obstruction or gangrene Following up with Dr. [name], CT abdomen and pelvis with contrast scheduled for February 9 for we will follow-up on this    Patient Care Team: [name], Jacob Him, MD as PCP - General (Internal Medicine) [name] Lance, MD as PCP - Electrophysiology (Cardiology) [name], MD as PCP - Cardiology (Cardiology) Jacob Puller, RN as Oncology Nurse Navigator  HISTORY OF PRESENT ILLNESS: 89 y.o. male with a past medical history of hypertension, dilated cardiomyopathy, heart block status post pacemaker, atrial flutter/atrial fibrillation on Xarelto, OSA on CPAP, history of diverticulosis, GERD with peptic ulcer disease status post Billroth II surgery, prostate cancer stage III yea 03/20/2021 s/p radiation therapy 05/-8- 09/17/2021 and others listed below presents for evaluation of stomach distention.   2012 colonoscopy diminutive adenomatous polyps and left-sided diverticulosis 2021 EGD for GI bleeding hemorrhagic gastritis and typical Billroth II anatomy 10/22/2020 CT abdomen pelvis with contrast for epigastric pain, weight loss abdominal discomfort and bloating postoperative distal gastrectomy and  gastrojejunostomy, sigmoid diverticulosis without diverticulitis nonobstructing right nephrolithiasis small fat-containing right inguinal hernia 10/03/2020 echocardiogram EF 60 to 65%, mild to moderate AS mild prolapse mitral valve 01/23/2021 PET scan for prostate cancer no metastatic adenopathy and pelvis or retroperitoneum no skeletal metastasis  Patient has history of constipation bloating with upper abdominal discomfort, last seen in the office 12/04/2020 with Dr. [name]. Was on pantoprazole 40 mg daily Was on Citrucel and MiraLAX daily. Wife and daughter are here with him.   03/19/2022 Consult with Dr. [name] at [place] for incisional hernia.  Pending CT AB and pelvis with contrast for Feb 9th at 4 PM He will have episodes with his entire AB will get "rock hard", with upper AB bulging, will have burping, nausea and will have to push it back in.   Patient's had years of abdominal bloating and constipation, with ventral hernia/incisional hernia having worsening symptoms possible some incarceration.  Pending CT with Dr. [name]. States he has changed his eating habits,avoiding gaseous foods like broccolli, cauliflower, has stopped coffee.  He is not eating until 1 pm, and within the past week he has not had as much bloating or any of the possible incisional hernia symptoms. He is on miralax and magnesium, takes it daily, and if he has 2 days without a BM he will take two full capfuls. He is also on colace 100 mg twice a day.  He does state the MiraLAX can make him slightly gassy he believes. He is on gas x daily.   He  reports that he quit smoking about 52 years ago. His smoking use included cigarettes. He started smoking about 52 years ago. He has a 60.00 pack-year smoking history. He has never used smokeless tobacco. He reports that he does not drink alcohol and does  not use drugs.  Current Medications:    Current Outpatient Medications (Cardiovascular):    furosemide (LASIX) 40 MG tablet,  TAKE 1 TABLET(40 MG) BY MOUTH DAILY  Current Outpatient Medications (Respiratory):    loratadine (CLARITIN) 10 MG tablet, Take 10 mg by mouth as needed.  Current Outpatient Medications (Analgesics):    allopurinol (ZYLOPRIM) 300 MG tablet, Take 1 tablet (300 mg total) by mouth daily.   traMADol (ULTRAM) 50 MG tablet, Take 1-2 tablets (50-100 mg total) by mouth every 6 (six) hours as needed for moderate pain.  Current Outpatient Medications (Hematological):    Cyanocobalamin (VITAMIN B 12) 100 MCG LOZG, Take 1,000 mg by mouth daily.   FeFum-FePoly-FA-B Cmp-C-Biot (INTEGRA PLUS) CAPS, Take 1 capsule by mouth every morning.   XARELTO 20 MG TABS tablet, TAKE 1 TABLET(20 MG) BY MOUTH DAILY WITH SUPPER  Current Outpatient Medications (Other):    B Complex-C (SUPER B COMPLEX PO), Take 1 mg by mouth once.   Cholecalciferol (VITAMIN D3) 2000 UNITS capsule, Take 1 capsule (2,000 Units total) by mouth daily.   Magnesium 200 MG TABS, Take 1 mg by mouth daily.   Melatonin 10 MG CAPS, Take 10 mg by mouth at bedtime.   Omega-3 Fatty Acids (FISH OIL) 600 MG CAPS, Take 600 mg by mouth daily.   pantoprazole (PROTONIX) 40 MG tablet, TAKE 1 TABLET(40 MG) BY MOUTH DAILY   Polyethylene Glycol 3350 (MIRALAX PO), Take 1 mg by mouth in the morning, at noon, and at bedtime.   Potassium 95 MG TABS, Take 1 mg by mouth daily.   tamsulosin (FLOMAX) 0.4 MG CAPS capsule, Take 0.4 mg by mouth daily.   Medical History:  Past Medical History:  Diagnosis Date   Anemia    not current issue per pt   Arthritis    "middle finger both hands" (09/05/2014)   Basal cell carcinoma    Benign prostatic hypertrophy    Bilateral lower extremity edema    occ swelling ankles goes down when props legs up   Bruises easily    Chronic constipation    Colon polyp 2006, 2010   TUBULAR ADENOMA   Diverticulosis of colon (without mention of hemorrhage)    First degree atrioventricular block    GERD (gastroesophageal reflux disease)     Gout    no recent flares   History of blood transfusion 12/1953; 03/1954   Transfused 7 pints blood '55, 4 pints '56   History of blood transfusion    3-5 years ago per pt on 06-27-2021   History of duodenal ulcer 1955   w/hemorrhage   Kidney stones    "passed them"   MVP (mitral valve prolapse)    Osteoarthritis    Pneumonia    "a number of times"   Presence of permanent cardiac pacemaker    device implanted 09-05-2014   Prostate cancer ([place])    Seasonal allergies    Sleep apnea    uses bipap set on 23-19 per 08-07-2020 sleep study epic   Thalassemia trait    lov dr [name]   Urinary frequency    Wears glasses for reading    Allergies:  Allergies  Allergen Reactions   Aspirin     Cannot take due to gi bleed age 18   Nsaids     Cannot take due to gi bleed age 40   Prednisone     REACTION: rash   Sulfa Antibiotics Rash and Other (See Comments)    "GI impact"  Surgical History:  He  has a past surgical history that includes Tumor removal (Left, 2002); Mole removal (Left, 2008); Cataract extraction w/ intraocular lens  implant, bilateral (2002-2004); Gastrectomy (03/1954); Colonoscopy (2015); Esophagogastroduodenoscopy (2011); Cardiac catheterization (N/A, 09/05/2014); Insert / replace / remove pacemaker (09/05/2014); Closed reduction ankle dislocation (Right); Gastrectomy (1956); Esophagogastroduodenoscopy (egd) with propofol (N/A, 10/19/2019); Hot hemostasis (N/A, 10/19/2019); biopsy (10/19/2019); Hemostasis control (10/19/2019); Gold seed implant (N/A, 07/03/2021); and SPACE OAR INSTILLATION (N/A, 07/03/2021). Family History:  His family history includes Cancer in his daughter; Coronary artery disease in his father; Gout in his father; Heart attack in his father; Stomach cancer in his paternal aunt and paternal grandmother.  REVIEW OF SYSTEMS  : All other systems reviewed and negative except where noted in the History of Present Illness.  PHYSICAL EXAM: BP 128/74    Pulse 67   Ht '5\' 7"'$  (1.702 m)   Wt 226 lb (102.5 kg)   SpO2 99%   BMI 35.40 kg/m  General:   Pleasant, well developed male in no acute distress Head:   Normocephalic and atraumatic. Eyes:  sclerae anicteric,conjunctive pink  Heart:   regular rate and rhythm Pulm:  Clear anteriorly; no wheezing Abdomen:   Soft, Obese AB, Active bowel sounds. No tenderness, vertical insicion with ventral hernia, No organomegaly appreciated. Rectal: Not evaluated Extremities:  Without edema. Msk: Symmetrical without gross deformities. Peripheral pulses intact.  Neurologic:  Alert and  oriented x4;  No focal deficits.  Skin:   Dry and intact without significant lesions or rashes. Psychiatric:  Cooperative. Normal mood and affect.  RELEVANT LABS AND IMAGING: CBC    Component Value Date/Time   WBC 5.7 06/23/2021 1408   RBC 5.75 06/23/2021 1408   HGB 12.6 (L) 07/03/2021 0757   HGB 12.8 (L) 05/02/2020 1214   HGB 11.7 (L) 02/01/2017 1106   HCT 37.0 (L) 07/03/2021 0757   HCT 42.5 05/02/2020 1214   HCT 35.8 (L) 02/01/2017 1106   PLT 141 (L) 06/23/2021 1408   PLT 198 05/02/2020 1214   MCV 64.3 (L) 06/23/2021 1408   MCV 66 (L) 05/02/2020 1214   MCV 62.8 (L) 02/01/2017 1106   MCH 20.2 (L) 06/23/2021 1408   MCHC 31.4 06/23/2021 1408   RDW 17.6 (H) 06/23/2021 1408   RDW 19.1 (H) 05/02/2020 1214   RDW 17.1 (H) 02/01/2017 1106   LYMPHSABS 1.6 11/01/2019 1359   LYMPHSABS 1.5 02/01/2017 1106   MONOABS 0.6 11/01/2019 1359   MONOABS 0.4 02/01/2017 1106   EOSABS 0.1 11/01/2019 1359   EOSABS 0.2 02/01/2017 1106   BASOSABS 0.1 11/01/2019 1359   BASOSABS 0.0 02/01/2017 1106    CMP     Component Value Date/Time   NA 142 07/03/2021 0757   NA 141 09/03/2020 0000   NA 141 12/12/2012 1522   K 3.6 07/03/2021 0757   K 4.2 12/12/2012 1522   CL 106 07/03/2021 0757   CO2 27 06/23/2021 1408   CO2 24 12/12/2012 1522   GLUCOSE 101 (H) 07/03/2021 0757   GLUCOSE 81 12/12/2012 1522   BUN 33 (H) 07/03/2021 0757    BUN 30 (H) 09/03/2020 0000   BUN 22.4 12/12/2012 1522   CREATININE 0.90 07/03/2021 0757   CREATININE 0.96 08/27/2014 0916   CREATININE 1.1 12/12/2012 1522   CALCIUM 9.8 06/23/2021 1408   CALCIUM 9.7 12/12/2012 1522   PROT 7.5 06/23/2021 1408   PROT 7.4 12/12/2012 1522   ALBUMIN 4.4 06/23/2021 1408   ALBUMIN 4.0 12/12/2012 1522  AST 29 06/23/2021 1408   AST 21 12/12/2012 1522   ALT 23 06/23/2021 1408   ALT 19 12/12/2012 1522   ALKPHOS 89 06/23/2021 1408   ALKPHOS 86 12/12/2012 1522   BILITOT 2.3 (H) 06/23/2021 1408   BILITOT 0.96 12/12/2012 1522   GFRNONAA >60 06/23/2021 1408   GFRAA >60 10/20/2019 0336     Vladimir Crofts, PA-C 3:25 PM

## 2022-04-07 ENCOUNTER — Ambulatory Visit: Payer: Medicare Other | Attending: Internal Medicine

## 2022-04-07 ENCOUNTER — Ambulatory Visit (HOSPITAL_BASED_OUTPATIENT_CLINIC_OR_DEPARTMENT_OTHER): Payer: Medicare Other | Admitting: Pulmonary Disease

## 2022-04-07 DIAGNOSIS — I442 Atrioventricular block, complete: Secondary | ICD-10-CM | POA: Diagnosis not present

## 2022-04-07 LAB — CUP PACEART REMOTE DEVICE CHECK
Battery Remaining Longevity: 2 mo
Battery Voltage: 2.85 V
Brady Statistic RA Percent Paced: 0.12 %
Brady Statistic RV Percent Paced: 98.46 %
Date Time Interrogation Session: 20240123163138
Implantable Lead Connection Status: 753985
Implantable Lead Connection Status: 753985
Implantable Lead Implant Date: 20160622
Implantable Lead Implant Date: 20160622
Implantable Lead Location: 753859
Implantable Lead Location: 753860
Implantable Lead Model: 5076
Implantable Lead Model: 5076
Implantable Pulse Generator Implant Date: 20160622
Lead Channel Impedance Value: 323 Ohm
Lead Channel Impedance Value: 342 Ohm
Lead Channel Impedance Value: 437 Ohm
Lead Channel Impedance Value: 437 Ohm
Lead Channel Pacing Threshold Amplitude: 0.625 V
Lead Channel Pacing Threshold Amplitude: 1.125 V
Lead Channel Pacing Threshold Pulse Width: 0.4 ms
Lead Channel Pacing Threshold Pulse Width: 0.4 ms
Lead Channel Sensing Intrinsic Amplitude: 2.125 mV
Lead Channel Sensing Intrinsic Amplitude: 2.125 mV
Lead Channel Sensing Intrinsic Amplitude: 9.625 mV
Lead Channel Sensing Intrinsic Amplitude: 9.625 mV
Lead Channel Setting Pacing Amplitude: 2 V
Lead Channel Setting Pacing Amplitude: 2.5 V
Lead Channel Setting Pacing Pulse Width: 0.4 ms
Lead Channel Setting Sensing Sensitivity: 4 mV
Zone Setting Status: 755011
Zone Setting Status: 755011

## 2022-04-09 ENCOUNTER — Ambulatory Visit (INDEPENDENT_AMBULATORY_CARE_PROVIDER_SITE_OTHER)
Admission: RE | Admit: 2022-04-09 | Discharge: 2022-04-09 | Disposition: A | Discharge status: Home / Self Care | Payer: Medicare Other | Source: Ambulatory Visit | Attending: Physician Assistant | Admitting: Physician Assistant

## 2022-04-09 ENCOUNTER — Encounter: Payer: Self-pay | Admitting: Physician Assistant

## 2022-04-09 ENCOUNTER — Ambulatory Visit (INDEPENDENT_AMBULATORY_CARE_PROVIDER_SITE_OTHER): Payer: Medicare Other | Admitting: Physician Assistant

## 2022-04-09 VITALS — BP 128/74 | HR 67 | Ht 67.0 in | Wt 226.0 lb

## 2022-04-09 DIAGNOSIS — K5900 Constipation, unspecified: Secondary | ICD-10-CM | POA: Diagnosis not present

## 2022-04-09 DIAGNOSIS — K432 Incisional hernia without obstruction or gangrene: Secondary | ICD-10-CM

## 2022-04-09 DIAGNOSIS — Z8711 Personal history of peptic ulcer disease: Secondary | ICD-10-CM

## 2022-04-09 DIAGNOSIS — R140 Abdominal distension (gaseous): Secondary | ICD-10-CM

## 2022-04-09 NOTE — Progress Notes (Signed)
Noted  

## 2022-04-09 NOTE — Patient Instructions (Addendum)
Your provider has requested that you have an abdominal x ray before leaving today. Please go to the basement floor to our Radiology department for the test.  We may want to evaluate you for small intestinal bacterial overgrowth, this can cause increase gas, bloating, loose stools or constipation.  There is a test for this we can do or sometimes we will treat a patient with an antibiotic to see if it helps.    Abdominal bloating and discomfort may be due to intestinal sensitivity or symptoms of irritable bowel syndrome. To relieve symptoms, avoid:  Broccoli  Baked beans  Cabbage  Carbonated drinks  Cauliflower  Chewing gum  Hard candy Abdominal distention resulting from weak abdominal muscles:  Is better in the morning  Gets worse as the day progresses  Is relieved by lying down Flatulence is gas created through bacterial action in the bowel and passed rectally. Keep in mind that:  10-18 passages per day are normal  Primary gases are harmless and odorless  Noticeable smells are trace gases related to food intake Foods to AVOID that are likely to form gas include:  Milk, dairy products, and medications that contain lactose--If your body doesn't produce the enzyme (lactase) to break it down.  Certain vegetables--baked beans, cauliflower, broccoli, cabbage  Certain starches--wheat, oats, corn, potatoes. Rice is a good substitute. Identify offending foods. Reduce or eliminate these gas-forming foods from your diet. Can look at the Bessie.     FODMAP stands for fermentable oligo-, di-, mono-saccharides and polyols (1). These are the scientific terms used to classify groups of carbs that are notorious for triggering digestive symptoms like bloating, gas and stomach pain.     Linzess 145 mcg *IBS-C patients may begin to experience relief from belly pain and overall abdominal symptoms (pain, discomfort, and bloating) in about 1 week,  with symptoms typically improving over 12  weeks.  Take at least 30 minutes before the first meal of the day on an empty stomach You can have a loose stool if you eat a high-fat breakfast. Give it at least 7 days, may have more bowel movements during that time.   The diarrhea should go away and you should start having normal, complete, full bowel movements.  It may be helpful to start treatment when you can be near the comfort of your own bathroom, such as a weekend.  After you are out we can send in a prescription if you did well, there is a prescription card  I appreciate the opportunity to care for you. Vicie Mutters PA-C

## 2022-04-10 ENCOUNTER — Other Ambulatory Visit: Payer: Self-pay | Admitting: Physician Assistant

## 2022-04-10 MED ORDER — LINACLOTIDE 145 MCG PO CAPS: 145.0000 ug | ORAL_CAPSULE | Freq: Every day | ORAL | 0 refills | Status: DC

## 2022-04-24 ENCOUNTER — Ambulatory Visit
Admission: RE | Admit: 2022-04-24 | Discharge: 2022-04-24 | Disposition: A | Discharge status: Home / Self Care | Payer: Medicare Other | Source: Ambulatory Visit | Attending: General Surgery | Admitting: General Surgery

## 2022-04-24 DIAGNOSIS — K432 Incisional hernia without obstruction or gangrene: Secondary | ICD-10-CM

## 2022-04-24 MED ORDER — IOPAMIDOL (ISOVUE-300) INJECTION 61%
100.0000 mL | Freq: Once | INTRAVENOUS | Status: AC | PRN
  Administered 2022-04-24: 100 mL via INTRAVENOUS

## 2022-04-27 ENCOUNTER — Encounter (HOSPITAL_BASED_OUTPATIENT_CLINIC_OR_DEPARTMENT_OTHER): Payer: Self-pay | Admitting: Pulmonary Disease

## 2022-04-27 ENCOUNTER — Ambulatory Visit (INDEPENDENT_AMBULATORY_CARE_PROVIDER_SITE_OTHER): Payer: Medicare Other | Admitting: Pulmonary Disease

## 2022-04-27 VITALS — BP 118/58 | HR 62 | Temp 98.8°F | Ht 67.0 in | Wt 223.2 lb

## 2022-04-27 DIAGNOSIS — G4733 Obstructive sleep apnea (adult) (pediatric): Secondary | ICD-10-CM

## 2022-04-27 NOTE — Progress Notes (Signed)
[facility]  Chief Complaint  Patient presents with   Follow-up    Bipap using nightly     Constitutional:  BP (!) 118/58 (BP Location: Left Arm, Cuff Size: Normal)   Pulse 62   Temp 98.8 F (37.1 C) (Oral)   Ht 5' 7"$  (1.702 m)   Wt 223 lb 3.2 oz (101.2 kg)   SpO2 97%   BMI 34.96 kg/m   Past Medical History:  Anemia, OA, BPH, Colon polyp, Diverticulosis, GERD, Gout, PUD s/p partial gastrectomy, Nephrolithiasis, Thalassemia, Pneumonia, Prostate cancer, s/p PM, Atrial fibrillation  Past Surgical History:  He  has a past surgical history that includes Tumor removal (Left, 2002); Mole removal (Left, 2008); Cataract extraction w/ intraocular lens  implant, bilateral (2002-2004); Gastrectomy (03/1954); Colonoscopy (2015); Esophagogastroduodenoscopy (2011); Cardiac catheterization (N/A, 09/05/2014); Insert / replace / remove pacemaker (09/05/2014); Closed reduction ankle dislocation (Right); Gastrectomy (1956); Esophagogastroduodenoscopy (egd) with propofol (N/A, 10/19/2019); Hot hemostasis (N/A, 10/19/2019); biopsy (10/19/2019); Hemostasis control (10/19/2019); Gold seed implant (N/A, 07/03/2021); and SPACE OAR INSTILLATION (N/A, 07/03/2021).  Brief Summary:  Jacob [name] is a 89 y.o. male former smoker with obstructive sleep apnea.      Subjective:   He is here with his wife and his daughter.  His Bipap machine is very old.  He reports using it consistently.  No issues with mask fit.  Gets about 4 hours sleep at night with Bipap.  He naps for about 1 to 2 hours in the afternoon.  He sleeps sitting up with his legs up.  His wife says he gets episodes of panting and then breath pause for about 10 seconds.  Happens a few times per week.  He is not aware of any issues.  Physical Exam:   Appearance - well kempt   ENMT - no sinus tenderness, no oral exudate, no LAN, Mallampati 3 airway, no stridor  Respiratory - equal breath sounds  bilaterally, no wheezing or rales  CV - s1s2 regular rate and rhythm, no murmurs  Ext - no clubbing, no edema  Skin - no rashes  Psych - normal mood and affect   Sleep Tests:  HST 04/09/16 >> AHI 49, SpO2 low 75% Auto CPAP 05/07/20 to 06/05/20 >> used on 30 of 30 nights with average 4 hrs 58 min.  Average AHI 32.9 with median CPAP 12 and 95 th percentile CPAP 15 cm H2O  Cardiac Tests:  Echo 09/07/16 >> EF 50%, mod LVH, severe LA dilation  Social History:  He  reports that he quit smoking about 52 years ago. His smoking use included cigarettes. He started smoking about 52 years ago. He has a 60.00 pack-year smoking history. He has never used smokeless tobacco. He reports that he does not drink alcohol and does not use drugs.  Family History:  His family history includes Cancer in his daughter; Coronary artery disease in his father; Gout in his father; Heart attack in his father; Stomach cancer in his paternal aunt and paternal grandmother.    Discussion:  He has long history of sleep apnea.  He has been compliant with CPAP.  He is having more difficulty with CPAP use and likely has also developed hypoventilation.  He has been tried on different CPAP settings and different mask fits.  He still has significant elevation in his AHI.  As such, it is my medical opinion that he would most likely be better served to continue with Bipap therapy.  Assessment/Plan:  Obstructive sleep apnea. - CPAP therapy no longer effective - he uses [facility] for his DME - his current device is more than 87 years old - will arrange for a Resmed auto Bipap with minimum EPAP 10, maximum IPAP 25, and pressure support 5 cm H2O   Obesity. - discussed importance of weight loss  Atrial fibrillation, bradycardia s/p pacemaker. - followed by Dr. [name] with [facility]  Thalassemia minor, iron deficiency anemia. - followed by Dr. Curt Bears with [facility]  Time Spent Involved in Patient  Care on Day of Examination:  36 minutes  Follow up:   Patient Instructions  Will arrange for new Bipap machine  Follow up in 4 months  Medication List:   Allergies as of 04/27/2022       Reactions   Aspirin    Cannot take due to gi bleed age 13   Nsaids    Cannot take due to gi bleed age 74   Prednisone    REACTION: rash   Sulfa Antibiotics Rash, Other (See Comments)   "GI impact"        Medication List        Accurate as of April 27, 2022  4:29 PM. If you have any questions, ask your nurse or doctor.          allopurinol 300 MG tablet Commonly known as: ZYLOPRIM Take 1 tablet (300 mg total) by mouth daily.   CITRUCEL FIBERSHAKE PO Take by mouth.   Coenzyme Q10 300 MG Caps Take by mouth.   docusate sodium 100 MG capsule Commonly known as: COLACE Take 100 mg by mouth 2 (two) times daily.   Fish Oil 600 MG Caps Take 600 mg by mouth daily.   furosemide 40 MG tablet Commonly known as: LASIX TAKE 1 TABLET(40 MG) BY MOUTH DAILY   Integra Plus Caps Take 1 capsule by mouth every morning.   linaclotide 145 MCG Caps capsule Commonly known as: Linzess Take 1 capsule (145 mcg total) by mouth daily before breakfast.   loratadine 10 MG tablet Commonly known as: CLARITIN Take 10 mg by mouth as needed.   Magnesium 200 MG Tabs Take 1 mg by mouth daily.   Melatonin 10 MG Caps Take 10 mg by mouth at bedtime.   MIRALAX PO Take 1 mg by mouth in the morning, at noon, and at bedtime.   niacinamide 500 MG tablet Take 500 mg by mouth 2 (two) times daily with a meal.   pantoprazole 40 MG tablet Commonly known as: PROTONIX TAKE 1 TABLET(40 MG) BY MOUTH DAILY   Potassium 95 MG Tabs Take 1 mg by mouth daily.   Potassium Gluconate 595 MG Caps Take by mouth.   RESVERATROL PLUS PO Take by mouth.   SUPER B COMPLEX PO Take 1 mg by mouth once.   tamsulosin 0.4 MG Caps capsule Commonly known as: FLOMAX Take 0.4 mg by mouth daily.   traMADol 50 MG  tablet Commonly known as: Ultram Take 1-2 tablets (50-100 mg total) by mouth every 6 (six) hours as needed for moderate pain.   Vitamin B 12 100 MCG Lozg Take 1,000 mg by mouth daily.   Vitamin D3 50 MCG (2000 UT) capsule Take 1 capsule (2,000 Units total) by mouth daily.   vitamin E 180 MG (400 UNITS) capsule Take 400 Units by mouth daily.   Xarelto 20 MG Tabs tablet Generic drug: rivaroxaban TAKE 1 TABLET(20 MG) BY MOUTH DAILY WITH SUPPER   zinc  gluconate 50 MG tablet Take 50 mg by mouth daily.        Signature:  Chesley Mires, MD [place] Pager - [phone] 04/27/2022, 4:29 PM

## 2022-04-27 NOTE — Patient Instructions (Signed)
Will arrange for new Bipap machine  Follow up in 4 months

## 2022-05-04 NOTE — Progress Notes (Signed)
Remote pacemaker transmission.   

## 2022-05-12 ENCOUNTER — Ambulatory Visit: Payer: Medicare Other

## 2022-05-12 DIAGNOSIS — I442 Atrioventricular block, complete: Secondary | ICD-10-CM | POA: Diagnosis not present

## 2022-05-13 LAB — CUP PACEART REMOTE DEVICE CHECK
Battery Remaining Longevity: 2 mo
Battery Voltage: 2.83 V
Brady Statistic RA Percent Paced: 0.12 %
Brady Statistic RV Percent Paced: 98.3 %
Date Time Interrogation Session: 20240226195752
Implantable Lead Connection Status: 753985
Implantable Lead Connection Status: 753985
Implantable Lead Implant Date: 20160622
Implantable Lead Implant Date: 20160622
Implantable Lead Location: 753859
Implantable Lead Location: 753860
Implantable Lead Model: 5076
Implantable Lead Model: 5076
Implantable Pulse Generator Implant Date: 20160622
Lead Channel Impedance Value: 323 Ohm
Lead Channel Impedance Value: 342 Ohm
Lead Channel Impedance Value: 437 Ohm
Lead Channel Impedance Value: 437 Ohm
Lead Channel Pacing Threshold Amplitude: 0.625 V
Lead Channel Pacing Threshold Amplitude: 1 V
Lead Channel Pacing Threshold Pulse Width: 0.4 ms
Lead Channel Pacing Threshold Pulse Width: 0.4 ms
Lead Channel Sensing Intrinsic Amplitude: 1.25 mV
Lead Channel Sensing Intrinsic Amplitude: 1.25 mV
Lead Channel Sensing Intrinsic Amplitude: 6.25 mV
Lead Channel Sensing Intrinsic Amplitude: 6.25 mV
Lead Channel Setting Pacing Amplitude: 2 V
Lead Channel Setting Pacing Amplitude: 2.5 V
Lead Channel Setting Pacing Pulse Width: 0.4 ms
Lead Channel Setting Sensing Sensitivity: 4 mV
Zone Setting Status: 755011
Zone Setting Status: 755011

## 2022-05-14 ENCOUNTER — Telehealth: Payer: Self-pay | Admitting: Pulmonary Disease

## 2022-05-14 NOTE — Telephone Encounter (Signed)
Talked to patient states it's been 2wks and he hasn't gotten his bipap machine. Sent community message to [facility] for update.

## 2022-05-14 NOTE — Telephone Encounter (Signed)
Patient is returning a call regarding a new bipap machine.  Please call patient to discuss further at [phone]

## 2022-05-19 ENCOUNTER — Ambulatory Visit: Payer: Medicare Other | Attending: Internal Medicine | Admitting: Internal Medicine

## 2022-05-19 ENCOUNTER — Encounter: Payer: Self-pay | Admitting: Internal Medicine

## 2022-05-19 VITALS — BP 120/58 | HR 66 | Ht 67.0 in | Wt 225.4 lb

## 2022-05-19 DIAGNOSIS — I442 Atrioventricular block, complete: Secondary | ICD-10-CM | POA: Diagnosis not present

## 2022-05-19 DIAGNOSIS — Z950 Presence of cardiac pacemaker: Secondary | ICD-10-CM | POA: Diagnosis not present

## 2022-05-19 DIAGNOSIS — I4821 Permanent atrial fibrillation: Secondary | ICD-10-CM

## 2022-05-19 NOTE — Progress Notes (Signed)
HPI [name] returns for ongoing evaluation of permanent atrial fib, CHB, s/p PPM insertion, sleep apnea and prostate CA. He is stable though he admits to some difficulty with his memory. He has been doing fasting and lost weight. He feels better. He denies chest pain or sob. Mild leg swelling.  He has not had syncope.  Allergies  Allergen Reactions   Aspirin     Cannot take due to gi bleed age 89   Nsaids     Cannot take due to gi bleed age 53   Prednisone     REACTION: rash   Sulfa Antibiotics Rash and Other (See Comments)    "GI impact"     Current Outpatient Medications  Medication Sig Dispense Refill   allopurinol (ZYLOPRIM) 300 MG tablet Take 1 tablet (300 mg total) by mouth daily. 90 tablet 3   B Complex-C (SUPER B COMPLEX PO) Take 1 mg by mouth once.     Cholecalciferol (VITAMIN D3) 2000 UNITS capsule Take 1 capsule (2,000 Units total) by mouth daily.     Coenzyme Q10 300 MG CAPS Take by mouth.     Cyanocobalamin (VITAMIN B 12) 100 MCG LOZG Take 1,000 mg by mouth daily.     docusate sodium (COLACE) 100 MG capsule Take 100 mg by mouth 2 (two) times daily.     furosemide (LASIX) 40 MG tablet TAKE 1 TABLET(40 MG) BY MOUTH DAILY 90 tablet 2   linaclotide (LINZESS) 145 MCG CAPS capsule Take 1 capsule (145 mcg total) by mouth daily before breakfast. 8 capsule 0   loratadine (CLARITIN) 10 MG tablet Take 10 mg by mouth as needed.     Magnesium 200 MG TABS Take 1 mg by mouth daily.     Melatonin 10 MG CAPS Take 20 mg by mouth at bedtime.     Methylcellulose, Laxative, (CITRUCEL FIBERSHAKE PO) Take by mouth.     niacinamide 500 MG tablet Take 500 mg by mouth 2 (two) times daily with a meal.     Omega-3 Fatty Acids (FISH OIL) 600 MG CAPS Take 1,400 mg by mouth daily.     pantoprazole (PROTONIX) 40 MG tablet TAKE 1 TABLET(40 MG) BY MOUTH DAILY 90 tablet 3   Potassium 95 MG TABS Take 595 mg by mouth daily.     Potassium Gluconate 595 MG CAPS Take by mouth.      Resveratrol-Quercetin (RESVERATROL PLUS PO) Take by mouth.     tamsulosin (FLOMAX) 0.4 MG CAPS capsule Take 0.4 mg by mouth daily.   11   vitamin E 180 MG (400 UNITS) capsule Take 400 Units by mouth daily.     XARELTO 20 MG TABS tablet TAKE 1 TABLET(20 MG) BY MOUTH DAILY WITH SUPPER 90 tablet 1   zinc gluconate 50 MG tablet Take 50 mg by mouth daily.     No current facility-administered medications for this visit.     Past Medical History:  Diagnosis Date   Anemia    not current issue per pt   Arthritis    "middle finger both hands" (09/05/2014)   Basal cell carcinoma    Benign prostatic hypertrophy    Bilateral lower extremity edema    occ swelling ankles goes down when props legs up   Bruises easily    Chronic constipation    Colon polyp 2006, 2010   TUBULAR ADENOMA   Diverticulosis of colon (without mention of hemorrhage)    First degree atrioventricular block  GERD (gastroesophageal reflux disease)    Gout    no recent flares   History of blood transfusion 12/1953; 03/1954   Transfused 7 pints blood '55, 4 pints '56   History of blood transfusion    3-5 years ago per pt on 06-27-2021   History of duodenal ulcer 1955   w/hemorrhage   Kidney stones    "passed them"   MVP (mitral valve prolapse)    Osteoarthritis    Pneumonia    "a number of times"   Presence of permanent cardiac pacemaker    device implanted 09-05-2014   Prostate cancer ([place])    Seasonal allergies    Sleep apnea    uses bipap set on 23-19 per 08-07-2020 sleep study epic   Thalassemia trait    lov dr [name]   Urinary frequency    Wears glasses for reading     ROS:   All systems reviewed and negative except as noted in the HPI.   Past Surgical History:  Procedure Laterality Date   BIOPSY  10/19/2019   Procedure: BIOPSY;  Surgeon: Milus Banister, MD;  Location: [facility];  Service: Endoscopy;;   CATARACT EXTRACTION W/ INTRAOCULAR LENS  IMPLANT, BILATERAL  2002-2004   CLOSED REDUCTION  ANKLE DISLOCATION Right    "toe my achilles" 1980's   COLONOSCOPY  2015   multiple    EP IMPLANTABLE DEVICE N/A 09/05/2014   Procedure: Pacemaker Implant;  Surgeon: [name], MD;  Location: [facility];  Service: Cardiovascular;  Laterality: N/A;   ESOPHAGOGASTRODUODENOSCOPY  2011   multiple   ESOPHAGOGASTRODUODENOSCOPY (EGD) WITH PROPOFOL N/A 10/19/2019   Procedure: ESOPHAGOGASTRODUODENOSCOPY (EGD) WITH PROPOFOL;  Surgeon: Milus Banister, MD;  Location: [facility];  Service: Endoscopy;  Laterality: N/A;   GASTRECTOMY  03/1954   Transfused 7 pints blood '55, 4 pints '56   GASTRECTOMY  1956   partial   GOLD SEED IMPLANT N/A 07/03/2021   Procedure: GOLD SEED IMPLANT;  Surgeon: Ardis Hughs, MD;  [facility];  Service: Urology;  Laterality: N/A;   HEMOSTASIS CONTROL  10/19/2019   Procedure: HEMOSTASIS CONTROL;  Surgeon: Milus Banister, MD;  Location: [facility];  Service: Endoscopy;;  hemaspray   HOT HEMOSTASIS N/A 10/19/2019   Procedure: HOT HEMOSTASIS (ARGON PLASMA COAGULATION/BICAP);  Surgeon: Milus Banister, MD;  Location: [facility];  Service: Endoscopy;  Laterality: N/A;   INSERT / REPLACE / REMOVE PACEMAKER  09/05/2014   MOLE REMOVAL Left 2008   "forearm"   SPACE OAR INSTILLATION N/A 07/03/2021   Procedure: SPACE OAR INSTILLATION;  Surgeon: Ardis Hughs, MD;  Location: [facility];  Service: Urology;  Laterality: N/A;   TUMOR REMOVAL Left 2002   from finger- left index     Family History  Problem Relation Age of Onset   Coronary artery disease Father    Heart attack Father    Gout Father    Stomach cancer Paternal Grandmother    Cancer Daughter        skin   Stomach cancer Paternal Aunt    Colon cancer Neg Hx    Esophageal cancer Neg Hx    Pancreatic cancer Neg Hx      Social History   Socioeconomic History   Marital status: Married    Spouse name: ANn   Number of children: 8   Years of  education: 18+   Highest education level: Not on file  Occupational History  Occupation: executive    Comment: retired  Tobacco Use   Smoking status: Former    Packs/day: 3.00    Years: 20.00    Total pack years: 60.00    Types: Cigarettes    Start date: 01/21/1970    Quit date: 02/1970    Years since quitting: 52.2   Smokeless tobacco: Never   Tobacco comments:    quite smoking cigarettes in [place] Use   Vaping Use: Never used  Substance and Sexual Activity   Alcohol use: No    Comment: "no alcohol since 1987"   Drug use: No   Sexual activity: Yes    Partners: Female  Other Topics Concern   Not on file  Social History Narrative   [employer] of [employer]; [place]. Married '59. Eight Daughters '60, '62, '66, '70, '73, '74, '78, '83; 64 grandchildren 1great grandchild. Lives independently with wife. ACP- asked pt to think about these issues (DEc '12)      Social Determinants of Health   Financial Resource Strain: Not on file  Food Insecurity: Not on file  Transportation Needs: Unmet Transportation Needs (09/17/2021)   PRAPARE - Hydrologist (Medical): Not on file    Lack of Transportation (Non-Medical): Yes  Physical Activity: Not on file  Stress: Not on file  Social Connections: Not on file  Intimate Partner Violence: Not on file     BP (!) 120/58   Pulse 66   Ht '5\' 7"'$  (1.702 m)   Wt 225 lb 6.4 oz (102.2 kg)   SpO2 97%   BMI 35.30 kg/m   Physical Exam:  Well appearing elderly man, NAD HEENT: Unremarkable Neck:  No JVD, no thyromegally Lymphatics:  No adenopathy Back:  No CVA tenderness Lungs:  Clear HEART:  Regular rate rhythm, no murmurs, no rubs, no clicks Abd:  soft, positive bowel sounds, no organomegally, no rebound, no guarding Ext:  2 plus pulses, no edema, no cyanosis, no clubbing Skin:  No rashes no nodules Neuro:  CN II through XII intact, motor grossly intact  EKG - atrial fib with ventricular  pacing  DEVICE  Normal device function.  See PaceArt for details. He is less than 2 months to ERI.  Assess/Plan:  Perm atrial fib - his VR is well controlled. No change in his meds. CHB - he is asymptomatic s/p PPM insertion.  PPM -his Medtronic DDD PM is working normally and programmed VVIR. As he is less than 2 months to ERI, I will schedule a PM gen change in 3 months. Sleep apnea - on bipap   Carleene Overlie Fiza Nation,MD

## 2022-05-19 NOTE — Patient Instructions (Addendum)
Medication Instructions:  Your physician recommends that you continue on your current medications as directed. Please refer to the Current Medication list given to you today.  *If you need a refill on your cardiac medications before your next appointment, please call your pharmacy*  Lab Work: You will return to [facility] to have a CBC and BMET drawn, within 30 days of your procedure.    If you have labs (blood work) drawn today and your tests are completely normal, you will receive your results only by: [place] (if you have MyChart) OR A paper copy in the mail If you have any lab test that is abnormal or we need to change your treatment, we will call you to review the results.  Testing/Procedures: None ordered.  Follow-Up:  Dr. Cristopher Peru has ordered a Generator change for your MDT PPM, and has been scheduled for Wednesday, August 26, 2022 at 130 pm.  An instructional letter and surgical scrub was given to the patient on 05/19/2022.      Remote monitoring is used to monitor your Pacemaker from home. This monitoring reduces the number of office visits required to check your device to one time per year. It allows [place] to keep an eye on the functioning of your device to ensure it is working properly. You are scheduled for a device check from home on 06/01/22. You may send your transmission at any time that day. If you have a wireless device, the transmission will be sent automatically. After your physician reviews your transmission, you will receive a postcard with your next transmission date.

## 2022-05-25 NOTE — Telephone Encounter (Signed)
Spk to patient gave him the info to contact cpap scheduling team.   RE: Bipap Received: 1 week ago Jacob [name] sent to Darliss Ridgel, [place]; [place], [facility],  Order is in and we have been trying to reach the patient to schedule him. We have been unable to contact him since  2-23 when we spoke to him and notified he of a small inventory delay. We have attempted to call him 5 times since then and have not been able to reach him. Patient needs to call us at [phone] and ask for cpap scheduling team.  Thank you,  Demetrius Charity

## 2022-06-01 ENCOUNTER — Ambulatory Visit (INDEPENDENT_AMBULATORY_CARE_PROVIDER_SITE_OTHER): Payer: Medicare Other

## 2022-06-01 DIAGNOSIS — I442 Atrioventricular block, complete: Secondary | ICD-10-CM

## 2022-06-02 ENCOUNTER — Telehealth: Payer: Self-pay

## 2022-06-02 LAB — CUP PACEART REMOTE DEVICE CHECK
Battery Remaining Longevity: 1 mo
Battery Voltage: 2.83 V
Brady Statistic RA Percent Paced: 0.1 %
Brady Statistic RV Percent Paced: 97.08 %
Date Time Interrogation Session: 20240318110321
Implantable Lead Connection Status: 753985
Implantable Lead Connection Status: 753985
Implantable Lead Implant Date: 20160622
Implantable Lead Implant Date: 20160622
Implantable Lead Location: 753859
Implantable Lead Location: 753860
Implantable Lead Model: 5076
Implantable Lead Model: 5076
Implantable Pulse Generator Implant Date: 20160622
Lead Channel Impedance Value: 304 Ohm
Lead Channel Impedance Value: 323 Ohm
Lead Channel Impedance Value: 418 Ohm
Lead Channel Impedance Value: 437 Ohm
Lead Channel Pacing Threshold Amplitude: 0.625 V
Lead Channel Pacing Threshold Amplitude: 1 V
Lead Channel Pacing Threshold Pulse Width: 0.4 ms
Lead Channel Pacing Threshold Pulse Width: 0.4 ms
Lead Channel Sensing Intrinsic Amplitude: 1.25 mV
Lead Channel Sensing Intrinsic Amplitude: 1.25 mV
Lead Channel Sensing Intrinsic Amplitude: 10 mV
Lead Channel Sensing Intrinsic Amplitude: 10 mV
Lead Channel Setting Pacing Amplitude: 2 V
Lead Channel Setting Pacing Amplitude: 2.5 V
Lead Channel Setting Pacing Pulse Width: 0.4 ms
Lead Channel Setting Sensing Sensitivity: 4 mV
Zone Setting Status: 755011
Zone Setting Status: 755011

## 2022-06-02 NOTE — Telephone Encounter (Signed)
Attempted to contact patient to provided dates to send remote transmission to check battery monthly. Device is not automatic send. No answer, LMTCB.

## 2022-06-03 NOTE — Telephone Encounter (Signed)
Spoke to patient and provided dates to send remote transmission.

## 2022-06-12 ENCOUNTER — Other Ambulatory Visit: Payer: Self-pay | Admitting: Internal Medicine

## 2022-06-12 DIAGNOSIS — I480 Paroxysmal atrial fibrillation: Secondary | ICD-10-CM

## 2022-06-15 NOTE — Telephone Encounter (Signed)
Prescription refill request for Xarelto received.  Indication: a fib Last office visit:05/19/22 Weight: 225 Age: 89 Scr: 0.9 07/03/21 epic CrCl: 85 mL/min

## 2022-06-17 NOTE — Progress Notes (Signed)
Remote pacemaker transmission.   

## 2022-06-25 ENCOUNTER — Encounter: Payer: Self-pay | Admitting: Internal Medicine

## 2022-06-25 ENCOUNTER — Ambulatory Visit (INDEPENDENT_AMBULATORY_CARE_PROVIDER_SITE_OTHER): Payer: Medicare Other | Admitting: Internal Medicine

## 2022-06-25 VITALS — BP 110/54 | HR 68 | Ht 66.0 in | Wt 227.0 lb

## 2022-06-25 DIAGNOSIS — K5900 Constipation, unspecified: Secondary | ICD-10-CM

## 2022-06-25 DIAGNOSIS — R140 Abdominal distension (gaseous): Secondary | ICD-10-CM

## 2022-06-25 DIAGNOSIS — R1013 Epigastric pain: Secondary | ICD-10-CM | POA: Diagnosis not present

## 2022-06-25 DIAGNOSIS — K432 Incisional hernia without obstruction or gangrene: Secondary | ICD-10-CM

## 2022-06-25 NOTE — Patient Instructions (Addendum)
Please follow up as needed.  Take miralax daily, and take citrate as discussed.   _______________________________________________________  If your blood pressure at your visit was 140/90 or greater, please contact your primary care physician to follow up on this.  _______________________________________________________  If you are age 87 or older, your body mass index should be between 23-30. Your Body mass index is 36.64 kg/m. If this is out of the aforementioned range listed, please consider follow up with your Primary Care Provider.  If you are age 20 or younger, your body mass index should be between 19-25. Your Body mass index is 36.64 kg/m. If this is out of the aformentioned range listed, please consider follow up with your Primary Care Provider.   ________________________________________________________  The  GI providers would like to encourage you to use [facility] to communicate with providers for non-urgent requests or questions.  Due to long hold times on the telephone, sending your provider a message by [facility] may be a faster and more efficient way to get a response.  Please allow 48 business hours for a response.  Please remember that this is for non-urgent requests.  _______________________________________________________ It was a pleasure to see you today!  Thank you for trusting me with your gastrointestinal care!

## 2022-06-25 NOTE — Progress Notes (Signed)
HISTORY OF PRESENT ILLNESS:  Jacob [name] is a 89 y.o. male with multiple medical problems who presents today regarding abdominal discomfort, constipation, and ventral hernia.  He is accompanied by his daughter and his wife joins [place] by speaker phone.  Patient was last seen by myself December 04, 2020 regarding epigastric pain and constipation.  See dictation.  His current history is that of being seen in the emergency department March 01, 2022 regarding abdominal pain with swelling.  It appears that he self reduced the hernia, felt better, and was discharged home.  He has a follow-up with general surgery, Dr. Andrey Campanile who diagnosed incisional hernia.  He was nonsuggestive about operating.  He did set the patient up for a CT scan of the abdomen and pelvis which was performed April 24, 2022.  The examination showed a small fat-containing umbilical hernia and right inguinal hernia no bowel obstruction.  Does have a remote history of Billroth II gastrectomy.  He was seen in the office by the GI physician assistant April 09, 2022.  Reviewed.  At that time she stated that he was having constipation.  He and his daughter deny this was an issue at that time.  However, he has had constipation recently.  No bowel movement for few days.  He uses MiraLAX with variable frequency and fiber tablets.  No nausea or vomiting.  He has noticed some increased abdominal distention.  Sometimes the area of ventral hernia is bit more prominent and sore.  When, he mentions change of color of stools to a more yellowish hue.  Additional x-rays and laboratories reviewed.  Noncontributory.  Last colonoscopy 2015 revealed severe diverticulosis, otherwise normal.  REVIEW OF SYSTEMS:  All non-GI ROS negative unless otherwise stated in HPI except for anxiety  Past Medical History:  Diagnosis Date   Anemia    not current issue per pt   Arthritis    "middle finger both hands" (09/05/2014)   Basal cell carcinoma     Benign prostatic hypertrophy    Bilateral lower extremity edema    occ swelling ankles goes down when props legs up   Bruises easily    Chronic constipation    Colon polyp 2006, 2010   TUBULAR ADENOMA   Diverticulosis of colon (without mention of hemorrhage)    First degree atrioventricular block    GERD (gastroesophageal reflux disease)    Gout    no recent flares   History of blood transfusion 12/1953; 03/1954   Transfused 7 pints blood '55, 4 pints '56   History of blood transfusion    3-5 years ago per pt on 06-27-2021   History of duodenal ulcer 1955   w/hemorrhage   Kidney stones    "passed them"   MVP (mitral valve prolapse)    Osteoarthritis    Pneumonia    "a number of times"   Presence of permanent cardiac pacemaker    device implanted 09-05-2014   Prostate cancer    Seasonal allergies    Sleep apnea    uses bipap set on 23-19 per 08-07-2020 sleep study epic   Thalassemia trait    lov dr Gwenyth Bouillon   Urinary frequency    Wears glasses for reading     Past Surgical History:  Procedure Laterality Date   BIOPSY  10/19/2019   Procedure: BIOPSY;  Surgeon: Rachael Fee, MD;  Location: [facility];  Service: Endoscopy;;   CATARACT EXTRACTION W/ INTRAOCULAR LENS  IMPLANT, BILATERAL  2002-2004   CLOSED REDUCTION  ANKLE DISLOCATION Right    "toe my achilles" 1980's   COLONOSCOPY  2015   multiple    EP IMPLANTABLE DEVICE N/A 09/05/2014   Procedure: Pacemaker Implant;  Surgeon: Marinus Maw, MD;  Location: [facility] INVASIVE CV LAB;  Service: Cardiovascular;  Laterality: N/A;   ESOPHAGOGASTRODUODENOSCOPY  2011   multiple   ESOPHAGOGASTRODUODENOSCOPY (EGD) WITH PROPOFOL N/A 10/19/2019   Procedure: ESOPHAGOGASTRODUODENOSCOPY (EGD) WITH PROPOFOL;  Surgeon: Rachael Fee, MD;  Location: [facility];  Service: Endoscopy;  Laterality: N/A;   GASTRECTOMY  03/1954   Transfused 7 pints blood '55, 4 pints '56   GASTRECTOMY  1956   partial   GOLD SEED IMPLANT N/A 07/03/2021    Procedure: GOLD SEED IMPLANT;  Surgeon: Crist Fat, MD;  Location: [facility];  Service: Urology;  Laterality: N/A;   HEMOSTASIS CONTROL  10/19/2019   Procedure: HEMOSTASIS CONTROL;  Surgeon: Rachael Fee, MD;  Location: [facility];  Service: Endoscopy;;  hemaspray   HOT HEMOSTASIS N/A 10/19/2019   Procedure: HOT HEMOSTASIS (ARGON PLASMA COAGULATION/BICAP);  Surgeon: Rachael Fee, MD;  Location: [facility];  Service: Endoscopy;  Laterality: N/A;   INSERT / REPLACE / REMOVE PACEMAKER  09/05/2014   MOLE REMOVAL Left 2008   "forearm"   SPACE OAR INSTILLATION N/A 07/03/2021   Procedure: SPACE OAR INSTILLATION;  Surgeon: Crist Fat, MD;  Location: [facility];  Service: Urology;  Laterality: N/A;   TUMOR REMOVAL Left 2002   from finger- left index    Social History Jacob [name]  reports that he quit smoking about 52 years ago. His smoking use included cigarettes. He started smoking about 52 years ago. He has a 60.00 pack-year smoking history. He has never used smokeless tobacco. He reports that he does not drink alcohol and does not use drugs.  family history includes Cancer in his daughter; Coronary artery disease in his father; Gout in his father; Heart attack in his father; Stomach cancer in his paternal aunt and paternal grandmother.  Allergies  Allergen Reactions   Aspirin     Cannot take due to gi bleed age 89   Nsaids     Cannot take due to gi bleed age 61   Prednisone     REACTION: rash   Sulfa Antibiotics Rash and Other (See Comments)    "GI impact"       PHYSICAL EXAMINATION: Vital signs: BP (!) 110/54 (BP Location: Left Arm, Patient Position: Sitting, Cuff Size: Normal)   Pulse 68   Ht 5\' 6"  (1.676 m)   Wt 227 lb (103 kg)   BMI 36.64 kg/m   Constitutional: Pleasant overweight elderly male, no acute distress Psychiatric: alert and oriented x 3, cooperative Eyes: extraocular movements intact, anicteric,  conjunctiva pink Mouth: oral pharynx moist, no lesions Neck: supple no lymphadenopathy Cardiovascular: heart regular rate and rhythm, no murmur Lungs: clear to auscultation bilaterally Abdomen: soft, obese, ventral hernia at the top of the midline incision in the epigastric region.  Approximately 4 cm in length.  Mildly tender with palpation, nondistended, no obvious ascites, no peritoneal signs, normal bowel sounds, no organomegaly Rectal: Omitted Extremities: no clubbing or cyanosis.  Trace lower extremity edema bilaterally Skin: no lesions on visible extremities Neuro: No focal deficits.  Cranial nerves intact  ASSESSMENT:  1.  Chronic constipation 2.  Abdominal bloating associated with the same 3.  Ventral hernia without obstruction 4.  Colonoscopy 2015 with diverticulosis 5.  Multiple medical problems and  advanced age   PLAN:  1.  Recommended MiraLAX.  Told to titrate to achieve desired effect. 2.  Further discussion of ventral hernia.  Questions answered 3.  Resume general medical care with PCP.  GI follow-up as needed Total time of 30 minutes was spent preparing to see the patient, reviewing the myriad of data, obtaining interval history, performing medically appropriate physical examination, counseling and educating the patient and his daughter regarding above listed issues, directing his medical therapies, and documenting clinical information in the health record

## 2022-07-03 ENCOUNTER — Ambulatory Visit: Payer: Medicare Other | Attending: Internal Medicine

## 2022-07-03 DIAGNOSIS — I442 Atrioventricular block, complete: Secondary | ICD-10-CM

## 2022-07-06 LAB — CUP PACEART REMOTE DEVICE CHECK
Battery Remaining Longevity: 1 mo — CL
Battery Voltage: 2.82 V
Brady Statistic RA Percent Paced: 0.1 %
Brady Statistic RV Percent Paced: 95.25 %
Date Time Interrogation Session: 20240422115952
Implantable Lead Connection Status: 753985
Implantable Lead Connection Status: 753985
Implantable Lead Implant Date: 20160622
Implantable Lead Implant Date: 20160622
Implantable Lead Location: 753859
Implantable Lead Location: 753860
Implantable Lead Model: 5076
Implantable Lead Model: 5076
Implantable Pulse Generator Implant Date: 20160622
Lead Channel Impedance Value: 323 Ohm
Lead Channel Impedance Value: 323 Ohm
Lead Channel Impedance Value: 418 Ohm
Lead Channel Impedance Value: 418 Ohm
Lead Channel Pacing Threshold Amplitude: 0.625 V
Lead Channel Pacing Threshold Amplitude: 0.875 V
Lead Channel Pacing Threshold Pulse Width: 0.4 ms
Lead Channel Pacing Threshold Pulse Width: 0.4 ms
Lead Channel Sensing Intrinsic Amplitude: 1.375 mV
Lead Channel Sensing Intrinsic Amplitude: 1.375 mV
Lead Channel Sensing Intrinsic Amplitude: 8.625 mV
Lead Channel Sensing Intrinsic Amplitude: 8.625 mV
Lead Channel Setting Pacing Amplitude: 2 V
Lead Channel Setting Pacing Amplitude: 2.5 V
Lead Channel Setting Pacing Pulse Width: 0.4 ms
Lead Channel Setting Sensing Sensitivity: 4 mV
Zone Setting Status: 755011
Zone Setting Status: 755011

## 2022-07-08 NOTE — Addendum Note (Signed)
Addended by: Geralyn Flash D on: 07/08/2022 04:30 PM   Modules accepted: Level of Service

## 2022-07-08 NOTE — Progress Notes (Signed)
Remote pacemaker transmission.   

## 2022-07-31 NOTE — Addendum Note (Signed)
Addended by: Elease Etienne A on: 07/31/2022 03:02 PM   Modules accepted: Level of Service

## 2022-07-31 NOTE — Progress Notes (Signed)
Remote pacemaker transmission.   

## 2022-08-04 ENCOUNTER — Telehealth: Payer: Self-pay

## 2022-08-04 DIAGNOSIS — Z950 Presence of cardiac pacemaker: Secondary | ICD-10-CM

## 2022-08-04 NOTE — Telephone Encounter (Signed)
Orders entered

## 2022-08-07 LAB — BASIC METABOLIC PANEL
BUN/Creatinine Ratio: 19 (ref 10–24)
BUN: 19 mg/dL (ref 8–27)
CO2: 25 mmol/L (ref 20–29)
Calcium: 10 mg/dL (ref 8.6–10.2)
Chloride: 104 mmol/L (ref 96–106)
Creatinine, Ser: 0.98 mg/dL (ref 0.76–1.27)
Glucose: 93 mg/dL (ref 70–99)
Potassium: 4.5 mmol/L (ref 3.5–5.2)
Sodium: 140 mmol/L (ref 134–144)
eGFR: 75 mL/min/{1.73_m2} (ref >59)

## 2022-08-07 LAB — CBC
Hematocrit: 32.9 % — ABNORMAL LOW (ref 37.5–51.0)
Hemoglobin: 9.8 g/dL — ABNORMAL LOW (ref 13.0–17.7)
MCH: 18.2 pg — ABNORMAL LOW (ref 26.6–33.0)
MCHC: 29.8 g/dL — ABNORMAL LOW (ref 31.5–35.7)
MCV: 61 fL — ABNORMAL LOW (ref 79–97)
Platelets: 228 10*3/uL (ref 150–450)
RBC: 5.38 x10E6/uL (ref 4.14–5.80)
RDW: 21.6 % — ABNORMAL HIGH (ref 11.6–15.4)
WBC: 3.3 10*3/uL — ABNORMAL LOW (ref 3.4–10.8)

## 2022-08-11 ENCOUNTER — Ambulatory Visit: Payer: Medicare Other

## 2022-08-20 ENCOUNTER — Other Ambulatory Visit: Payer: Self-pay | Admitting: Cardiology

## 2022-08-20 ENCOUNTER — Telehealth: Payer: Self-pay | Admitting: Internal Medicine

## 2022-08-20 NOTE — Telephone Encounter (Signed)
Follow Up:   Wife is calling back. [facility]. She wants to know where can he find it please.

## 2022-08-20 NOTE — Telephone Encounter (Signed)
Pt called back per message received in [facility].    Pt was concerned about not having their surgical scrub soap at home.  Pt spouse stated they need the soap prior to the procedure on 6/12 with Dr. Ladona Ridgel.  I advised Pt and spouse that I will meet them at the front door of [facility] building, and give them the soap, so he can have / use the night before and morning of his procedure.    Pt spouse advised his CBC results on 5/23, pre-procedure labs came back low.  These results were taken to Dr. Elberta Fortis DOD, since Dr. Ladona Ridgel is out of the office.  Dr. Elberta Fortis advised to have labs redrawn at the hospital the morning of the procedure.  This information was shared with the Pt spouse, and they were fine with the redraw lab check.  Will advise scheduling of this redraw and enter orders.   Pt will call us when they are at [facility] front door for surgical scrub.    Pre-procedure area contacted by April of scheduling per Dr. Elberta Fortis order, to have CBC and BMET rechecked prior to [facility] Gen Change with Dr. Ladona Ridgel; 08/26/2022

## 2022-08-20 NOTE — Telephone Encounter (Signed)
Patient states he was advised to use [facility] surgical scrub prior to 6/12 procedure with Dr. Ladona Ridgel. He would like to know where he can find this.

## 2022-08-24 ENCOUNTER — Telehealth: Payer: Self-pay | Admitting: Internal Medicine

## 2022-08-24 NOTE — Telephone Encounter (Signed)
Pt called [facility] Triage c/o of swollen gums.   Pt wanted Dr. Ladona Ridgel to call in an antibiotic for him. Pt called back and advised Dr. Ladona Ridgel was in procedure lab today, and not available d/t procedures.  Pt advised to contact PCP, Dr. Wylene Simmer, and speak with RN regarding his concern.  Pt verbalized understanding and will follow up with PCP office.

## 2022-08-24 NOTE — Telephone Encounter (Signed)
New MessageA:        Patient says his gums are swollen and he thinks he has an infection. He is scheduled for a procedure on Wednesday(08-26-22). He wants to know if Dr Ladona Ridgel could call in something for his infection please?

## 2022-08-25 NOTE — Pre-Procedure Instructions (Signed)
Attempted to call patient regarding procedure instructions.  No one answered.  Instructed patient on the following items: Arrival time 1200 Nothing to eat or drink after midnight No meds AM of procedure Responsible person to drive you home and stay with you for 24 hrs Wash with special soap night before and morning of procedure If on anti-coagulant drug instructions Xarelto- last dose should be on 6/9.

## 2022-08-26 ENCOUNTER — Ambulatory Visit (HOSPITAL_COMMUNITY)
Admission: RE | Admit: 2022-08-26 | Discharge: 2022-08-26 | Disposition: A | Discharge status: Home / Self Care | Payer: Medicare Other | Attending: Internal Medicine | Admitting: Internal Medicine

## 2022-08-26 ENCOUNTER — Other Ambulatory Visit: Payer: Self-pay

## 2022-08-26 ENCOUNTER — Encounter (HOSPITAL_COMMUNITY)
Admission: RE | Disposition: A | Discharge status: Home / Self Care | Payer: Self-pay | Source: Home / Self Care | Attending: Internal Medicine

## 2022-08-26 DIAGNOSIS — G4730 Sleep apnea, unspecified: Secondary | ICD-10-CM | POA: Diagnosis not present

## 2022-08-26 DIAGNOSIS — C61 Malignant neoplasm of prostate: Secondary | ICD-10-CM | POA: Insufficient documentation

## 2022-08-26 DIAGNOSIS — Z45010 Encounter for checking and testing of cardiac pacemaker pulse generator [battery]: Secondary | ICD-10-CM | POA: Insufficient documentation

## 2022-08-26 DIAGNOSIS — I442 Atrioventricular block, complete: Secondary | ICD-10-CM | POA: Diagnosis not present

## 2022-08-26 DIAGNOSIS — I4821 Permanent atrial fibrillation: Secondary | ICD-10-CM | POA: Insufficient documentation

## 2022-08-26 DIAGNOSIS — I420 Dilated cardiomyopathy: Secondary | ICD-10-CM

## 2022-08-26 HISTORY — PX: PPM GENERATOR CHANGEOUT: EP1233

## 2022-08-26 LAB — CBC
HCT: 31.5 % — ABNORMAL LOW (ref 39.0–52.0)
Hemoglobin: 9.6 g/dL — ABNORMAL LOW (ref 13.0–17.0)
MCH: 18.4 pg — ABNORMAL LOW (ref 26.0–34.0)
MCHC: 30.5 g/dL (ref 30.0–36.0)
MCV: 60.3 fL — ABNORMAL LOW (ref 80.0–100.0)
Platelets: 136 10*3/uL — ABNORMAL LOW (ref 150–400)
RBC: 5.22 MIL/uL (ref 4.22–5.81)
RDW: 20.4 % — ABNORMAL HIGH (ref 11.5–15.5)
WBC: 3.1 10*3/uL — ABNORMAL LOW (ref 4.0–10.5)
nRBC: 0 % (ref 0.0–0.2)

## 2022-08-26 LAB — BASIC METABOLIC PANEL
Anion gap: 12 (ref 5–15)
BUN: 18 mg/dL (ref 8–23)
CO2: 24 mmol/L (ref 22–32)
Calcium: 9.4 mg/dL (ref 8.9–10.3)
Chloride: 103 mmol/L (ref 98–111)
Creatinine, Ser: 0.94 mg/dL (ref 0.61–1.24)
GFR, Estimated: >60 mL/min (ref >60)
Glucose, Bld: 93 mg/dL (ref 70–99)
Potassium: 3.7 mmol/L (ref 3.5–5.1)
Sodium: 139 mmol/L (ref 135–145)

## 2022-08-26 SURGERY — PPM GENERATOR CHANGEOUT

## 2022-08-26 MED ORDER — ONDANSETRON HCL 4 MG/2ML IJ SOLN: 4.0000 mg | Freq: Four times a day (QID) | INTRAMUSCULAR | Status: DC | PRN

## 2022-08-26 MED ORDER — FENTANYL CITRATE (PF) 100 MCG/2ML IJ SOLN
INTRAMUSCULAR | Status: AC
  Filled 2022-08-26: qty 2

## 2022-08-26 MED ORDER — POVIDONE-IODINE 10 % EX SWAB
2.0000 | Freq: Once | CUTANEOUS | Status: AC
  Administered 2022-08-26: 2 via TOPICAL

## 2022-08-26 MED ORDER — MIDAZOLAM HCL 5 MG/5ML IJ SOLN
INTRAMUSCULAR | Status: AC
  Filled 2022-08-26: qty 5

## 2022-08-26 MED ORDER — LIDOCAINE HCL (PF) 1 % IJ SOLN
INTRAMUSCULAR | Status: AC
  Filled 2022-08-26: qty 60

## 2022-08-26 MED ORDER — SODIUM CHLORIDE 0.9 % IV SOLN: 80.0000 mg | INTRAVENOUS | Status: AC

## 2022-08-26 MED ORDER — MIDAZOLAM HCL 5 MG/5ML IJ SOLN
INTRAMUSCULAR | Status: DC | PRN
  Administered 2022-08-26: 1 mg via INTRAVENOUS

## 2022-08-26 MED ORDER — SODIUM CHLORIDE 0.9 % IV SOLN: INTRAVENOUS | Status: DC

## 2022-08-26 MED ORDER — CEFAZOLIN SODIUM-DEXTROSE 2-4 GM/100ML-% IV SOLN
INTRAVENOUS | Status: AC
  Administered 2022-08-26: 2 g via INTRAVENOUS
  Filled 2022-08-26: qty 100

## 2022-08-26 MED ORDER — FENTANYL CITRATE (PF) 100 MCG/2ML IJ SOLN
INTRAMUSCULAR | Status: DC | PRN
  Administered 2022-08-26: 12.5 ug via INTRAVENOUS

## 2022-08-26 MED ORDER — SODIUM CHLORIDE 0.9 % IV SOLN
INTRAVENOUS | Status: AC
  Administered 2022-08-26: 80 mg
  Filled 2022-08-26: qty 2

## 2022-08-26 MED ORDER — LIDOCAINE HCL (PF) 1 % IJ SOLN
INTRAMUSCULAR | Status: DC | PRN
  Administered 2022-08-26: 55 mL

## 2022-08-26 MED ORDER — CHLORHEXIDINE GLUCONATE 4 % EX LIQD
4.0000 | Freq: Once | CUTANEOUS | Status: DC
  Filled 2022-08-26: qty 60

## 2022-08-26 MED ORDER — CEFAZOLIN SODIUM-DEXTROSE 2-4 GM/100ML-% IV SOLN: 2.0000 g | INTRAVENOUS | Status: AC

## 2022-08-26 MED ORDER — ACETAMINOPHEN 325 MG PO TABS: 325.0000 mg | ORAL_TABLET | ORAL | Status: DC | PRN

## 2022-08-26 SURGICAL SUPPLY — 7 items
CABLE SURGICAL S-101-97-12 (CABLE) ×1 IMPLANT
IPG PACE AZUR XT DR MRI W1DR01 (Pacemaker) IMPLANT
PACE AZURE XT DR MRI W1DR01 (Pacemaker) ×1 IMPLANT
PAD DEFIB RADIO PHYSIO CONN (PAD) ×1 IMPLANT
POUCH AIGIS-R ANTIBACT PPM (Mesh General) ×1 IMPLANT
POUCH AIGIS-R ANTIBACT PPM MED (Mesh General) IMPLANT
TRAY PACEMAKER INSERTION (PACKS) ×1 IMPLANT

## 2022-08-26 NOTE — Discharge Instructions (Signed)

## 2022-08-26 NOTE — H&P (Signed)
HPI Jacob [name] returns for ongoing evaluation of permanent atrial fib, CHB, s/p PPM insertion, sleep apnea and prostate CA. He is stable though he admits to some difficulty with his memory. He has been doing fasting and lost weight. He feels better. He denies chest pain or sob. Mild leg swelling.  He has not had syncope.       Allergies  Allergen Reactions   Aspirin        Cannot take due to gi bleed age 89   Nsaids        Cannot take due to gi bleed age 6   Prednisone        REACTION: rash   Sulfa Antibiotics Rash and Other (See Comments)      "GI impact"              Current Outpatient Medications  Medication Sig Dispense Refill   allopurinol (ZYLOPRIM) 300 MG tablet Take 1 tablet (300 mg total) by mouth daily. 90 tablet 3   B Complex-C (SUPER B COMPLEX PO) Take 1 mg by mouth once.       Cholecalciferol (VITAMIN D3) 2000 UNITS capsule Take 1 capsule (2,000 Units total) by mouth daily.       Coenzyme Q10 300 MG CAPS Take by mouth.       Cyanocobalamin (VITAMIN B 12) 100 MCG LOZG Take 1,000 mg by mouth daily.       docusate sodium (COLACE) 100 MG capsule Take 100 mg by mouth 2 (two) times daily.       furosemide (LASIX) 40 MG tablet TAKE 1 TABLET(40 MG) BY MOUTH DAILY 90 tablet 2   linaclotide (LINZESS) 145 MCG CAPS capsule Take 1 capsule (145 mcg total) by mouth daily before breakfast. 8 capsule 0   loratadine (CLARITIN) 10 MG tablet Take 10 mg by mouth as needed.       Magnesium 200 MG TABS Take 1 mg by mouth daily.       Melatonin 10 MG CAPS Take 20 mg by mouth at bedtime.       Methylcellulose, Laxative, (CITRUCEL FIBERSHAKE PO) Take by mouth.       niacinamide 500 MG tablet Take 500 mg by mouth 2 (two) times daily with a meal.       Omega-3 Fatty Acids (FISH OIL) 600 MG CAPS Take 1,400 mg by mouth daily.       pantoprazole (PROTONIX) 40 MG tablet TAKE 1 TABLET(40 MG) BY MOUTH DAILY 90 tablet 3   Potassium 95 MG TABS Take 595 mg by mouth daily.        Potassium Gluconate 595 MG CAPS Take by mouth.       Resveratrol-Quercetin (RESVERATROL PLUS PO) Take by mouth.       tamsulosin (FLOMAX) 0.4 MG CAPS capsule Take 0.4 mg by mouth daily.    11   vitamin E 180 MG (400 UNITS) capsule Take 400 Units by mouth daily.       XARELTO 20 MG TABS tablet TAKE 1 TABLET(20 MG) BY MOUTH DAILY WITH SUPPER 90 tablet 1   zinc gluconate 50 MG tablet Take 50 mg by mouth daily.        No current facility-administered medications for this visit.            Past Medical History:  Diagnosis Date   Anemia      not current issue per pt   Arthritis      "  middle finger both hands" (09/05/2014)   Basal cell carcinoma     Benign prostatic hypertrophy     Bilateral lower extremity edema      occ swelling ankles goes down when props legs up   Bruises easily     Chronic constipation     Colon polyp 2006, 2010    TUBULAR ADENOMA   Diverticulosis of colon (without mention of hemorrhage)     First degree atrioventricular block     GERD (gastroesophageal reflux disease)     Gout      no recent flares   History of blood transfusion 12/1953; 03/1954    Transfused 7 pints blood '55, 4 pints '56   History of blood transfusion      3-5 years ago per pt on 06-27-2021   History of duodenal ulcer 1955    w/hemorrhage   Kidney stones      "passed them"   MVP (mitral valve prolapse)     Osteoarthritis     Pneumonia      "a number of times"   Presence of permanent cardiac pacemaker      device implanted 09-05-2014   Prostate cancer (HCC)     Seasonal allergies     Sleep apnea      uses bipap set on 23-19 per 08-07-2020 sleep study epic   Thalassemia trait      lov dr Gwenyth Bouillon   Urinary frequency     Wears glasses for reading        ROS:    All systems reviewed and negative except as noted in the HPI.          Past Surgical History:  Procedure Laterality Date   BIOPSY   10/19/2019    Procedure: BIOPSY;  Surgeon: Rachael Fee, MD;  Location: [facility];  Service: Endoscopy;;   CATARACT EXTRACTION W/ INTRAOCULAR LENS  IMPLANT, BILATERAL   2002-2004   CLOSED REDUCTION ANKLE DISLOCATION Right      "toe my achilles" 1980's   COLONOSCOPY   2015    multiple    EP IMPLANTABLE DEVICE N/A 09/05/2014    Procedure: Pacemaker Implant;  Surgeon: Marinus Maw, MD;  Location: [facility] INVASIVE CV LAB;  Service: Cardiovascular;  Laterality: N/A;   ESOPHAGOGASTRODUODENOSCOPY   2011    multiple   ESOPHAGOGASTRODUODENOSCOPY (EGD) WITH PROPOFOL N/A 10/19/2019    Procedure: ESOPHAGOGASTRODUODENOSCOPY (EGD) WITH PROPOFOL;  Surgeon: Rachael Fee, MD;  Location: [facility];  Service: Endoscopy;  Laterality: N/A;   GASTRECTOMY   03/1954    Transfused 7 pints blood '55, 4 pints '56   GASTRECTOMY   1956    partial   GOLD SEED IMPLANT N/A 07/03/2021    Procedure: GOLD SEED IMPLANT;  Surgeon: Crist Fat, MD;  Location: [facility];  Service: Urology;  Laterality: N/A;   HEMOSTASIS CONTROL   10/19/2019    Procedure: HEMOSTASIS CONTROL;  Surgeon: Rachael Fee, MD;  Location: [facility];  Service: Endoscopy;;  hemaspray   HOT HEMOSTASIS N/A 10/19/2019    Procedure: HOT HEMOSTASIS (ARGON PLASMA COAGULATION/BICAP);  Surgeon: Rachael Fee, MD;  Location: [facility];  Service: Endoscopy;  Laterality: N/A;   INSERT / REPLACE / REMOVE PACEMAKER   09/05/2014   MOLE REMOVAL Left 2008    "forearm"   SPACE OAR INSTILLATION N/A 07/03/2021    Procedure: SPACE OAR INSTILLATION;  Surgeon: Crist Fat, MD;  Location: [facility];  Service: Urology;  Laterality: N/A;   TUMOR REMOVAL Left 2002    from finger- left index             Family History  Problem Relation Age of Onset   Coronary artery disease Father     Heart attack Father     Gout Father     Stomach cancer Paternal Grandmother     Cancer Daughter          skin   Stomach cancer Paternal Aunt     Colon cancer Neg Hx     Esophageal cancer Neg  Hx     Pancreatic cancer Neg Hx          Social History         Socioeconomic History   Marital status: Married      Spouse name: ANn   Number of children: 8   Years of education: 18+   Highest education level: Not on file  Occupational History   Occupation: executive      Comment: retired  Tobacco Use   Smoking status: Former      Packs/day: 3.00      Years: 20.00      Total pack years: 60.00      Types: Cigarettes      Start date: 01/21/1970      Quit date: 02/1970      Years since quitting: 52.2   Smokeless tobacco: Never   Tobacco comments:      quite smoking cigarettes in 1971  Vaping Use   Vaping Use: Never used  Substance and Sexual Activity   Alcohol use: No      Comment: "no alcohol since 1987"   Drug use: No   Sexual activity: Yes      Partners: Female  Other Topics Concern   Not on file  Social History Narrative    [employer]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 Married '59. Eight Daughters '60, '62, '66, '70, '73, '74, '78, '83; 20 grandchildren 1great grandchild. Lives independently with wife. ACP- asked pt to think about these issues (DEc '12)         Social Determinants of Health        Financial Resource Strain: Not on file  Food Insecurity: Not on file  Transportation Needs: Unmet Transportation Needs (09/17/2021)    PRAPARE - Therapist, art (Medical): Not on file     Lack of Transportation (Non-Medical): Yes  Physical Activity: Not on file  Stress: Not on file  Social Connections: Not on file  Intimate Partner Violence: Not on file        BP (!) 120/58   Pulse 66   Ht 5\' 7"  (1.702 m)   Wt 225 lb 6.4 oz (102.2 kg)   SpO2 97%   BMI 35.30 kg/m    Physical Exam:   Well appearing elderly man, NAD HEENT: Unremarkable Neck:  No JVD, no thyromegally Lymphatics:  No adenopathy Back:  No CVA tenderness Lungs:  Clear HEART:  Regular rate rhythm, no murmurs, no rubs, no clicks Abd:  soft, positive bowel sounds, no  organomegally, no rebound, no guarding Ext:  2 plus pulses, no edema, no cyanosis, no clubbing Skin:  No rashes no nodules Neuro:  CN II through XII intact, motor grossly intact   EKG - atrial fib with ventricular pacing   DEVICE  Normal device function.  See PaceArt for details. He is less than 2 months to ERI.   Assess/Plan:  Perm atrial fib - his VR is well controlled. No change in his meds. CHB - he is asymptomatic s/p PPM insertion.  PPM -his Medtronic DDD PM is working normally and programmed VVIR. As he is less than 2 months to ERI, I will schedule a PM gen change in 3 months. Sleep apnea - on bipap   Dorathy Daft  EP Attending  Patient seen and examined. The patient presents for PPM gen change out. He has CHB and is dependent and has reached ERI. I have discussed the indications/risks/benefits/goals/expectations and he wishes to proceed.  Sharlot Gowda Trinadee Verhagen,MD

## 2022-08-27 ENCOUNTER — Encounter (HOSPITAL_COMMUNITY): Payer: Self-pay | Admitting: Internal Medicine

## 2022-08-28 ENCOUNTER — Other Ambulatory Visit: Payer: Self-pay

## 2022-09-08 NOTE — Patient Instructions (Signed)

## 2022-09-09 ENCOUNTER — Ambulatory Visit: Payer: Medicare Other | Attending: Cardiology

## 2022-09-09 DIAGNOSIS — I420 Dilated cardiomyopathy: Secondary | ICD-10-CM

## 2022-09-09 LAB — CUP PACEART INCLINIC DEVICE CHECK
Date Time Interrogation Session: 20240626202847
Implantable Lead Connection Status: 753985
Implantable Lead Connection Status: 753985
Implantable Lead Implant Date: 20160622
Implantable Lead Implant Date: 20160622
Implantable Lead Location: 753859
Implantable Lead Location: 753860
Implantable Lead Model: 5076
Implantable Lead Model: 5076
Implantable Pulse Generator Implant Date: 20240612

## 2022-09-09 NOTE — Progress Notes (Signed)
Wound check appointment. Steri-strips removed. Wound without redness or edema. Incision edges approximated, wound well healed. Normal device function. Thresholds, sensing, and impedances consistent with implant measurements. Device programmed appropriately for chronic leads. Histogram distribution appropriate for patient and level of activity. AT/AF BURDEN 100%, known permanent AF/flutter hx, asymptomatic, on Xarelto, ventricular rates controlled. Patient educated about wound care.  No arm or lifting restrictions due to gen change only.  ROV in 3 months with implanting physician.  No programming changes were made.

## 2022-09-14 ENCOUNTER — Ambulatory Visit (HOSPITAL_BASED_OUTPATIENT_CLINIC_OR_DEPARTMENT_OTHER): Payer: Medicare Other | Admitting: Pulmonary Disease

## 2022-10-17 ENCOUNTER — Other Ambulatory Visit: Payer: Self-pay | Admitting: Internal Medicine

## 2022-10-17 DIAGNOSIS — I480 Paroxysmal atrial fibrillation: Secondary | ICD-10-CM

## 2022-10-19 NOTE — Telephone Encounter (Signed)
Prescription refill request for Xarelto received.  Indication:afib Last office visit:3/24 Weight:98.4  kg Age:89 Scr:0.94  6/24 CrCl:77.06  ml/min  Prescription refilled

## 2022-11-10 ENCOUNTER — Ambulatory Visit: Payer: Medicare Other

## 2022-11-15 DEATH — deceased

## 2022-11-17 ENCOUNTER — Ambulatory Visit (HOSPITAL_BASED_OUTPATIENT_CLINIC_OR_DEPARTMENT_OTHER): Payer: Medicare Other | Admitting: Pulmonary Disease

## 2022-12-01 ENCOUNTER — Ambulatory Visit: Payer: Medicare Other | Admitting: Internal Medicine

## 2023-02-09 ENCOUNTER — Ambulatory Visit: Payer: Medicare Other
